# Patient Record
Sex: Female | Born: 1935
Health system: Southern US, Community
[De-identification: ages and names within clinical notes are randomized; demographics above are authoritative.]

## PROBLEM LIST (undated history)

## (undated) DIAGNOSIS — R002 Palpitations: Secondary | ICD-10-CM

## (undated) DIAGNOSIS — I779 Disorder of arteries and arterioles, unspecified: Secondary | ICD-10-CM

## (undated) DIAGNOSIS — I253 Aneurysm of heart: Secondary | ICD-10-CM

## (undated) DIAGNOSIS — C561 Malignant neoplasm of right ovary: Secondary | ICD-10-CM

## (undated) DIAGNOSIS — I340 Nonrheumatic mitral (valve) insufficiency: Secondary | ICD-10-CM

## (undated) DIAGNOSIS — E785 Hyperlipidemia, unspecified: Secondary | ICD-10-CM

## (undated) DIAGNOSIS — I10 Essential (primary) hypertension: Secondary | ICD-10-CM

## (undated) DIAGNOSIS — I639 Cerebral infarction, unspecified: Secondary | ICD-10-CM

## (undated) DIAGNOSIS — I719 Aortic aneurysm of unspecified site, without rupture: Secondary | ICD-10-CM

## (undated) DIAGNOSIS — C801 Malignant (primary) neoplasm, unspecified: Secondary | ICD-10-CM

## (undated) DIAGNOSIS — Z8679 Personal history of other diseases of the circulatory system: Secondary | ICD-10-CM

## (undated) DIAGNOSIS — I712 Thoracic aortic aneurysm, without rupture, unspecified: Secondary | ICD-10-CM

## (undated) HISTORY — DX: Disorder of arteries and arterioles, unspecified: I77.9

## (undated) HISTORY — DX: Malignant (primary) neoplasm, unspecified: C80.1

## (undated) HISTORY — PX: CERVICAL BIOPSY  W/ LOOP ELECTRODE EXCISION: SUR135

## (undated) HISTORY — DX: Aneurysm of heart: I25.3

## (undated) HISTORY — DX: Hyperlipidemia, unspecified: E78.5

## (undated) HISTORY — DX: Cerebral infarction, unspecified: I63.9

## (undated) HISTORY — DX: Palpitations: R00.2

## (undated) HISTORY — DX: Thoracic aortic aneurysm, without rupture, unspecified: I71.20

## (undated) HISTORY — DX: Aortic aneurysm of unspecified site, without rupture: I71.9

## (undated) HISTORY — PX: WRIST FRACTURE SURGERY: SHX121

## (undated) HISTORY — DX: Nonrheumatic mitral (valve) insufficiency: I34.0

---

## 1988-04-05 HISTORY — PX: ROTATOR CUFF REPAIR: SHX139

## 1991-04-06 HISTORY — PX: APPENDECTOMY: SHX54

## 1994-04-05 HISTORY — PX: CHOLECYSTECTOMY: SHX55

## 1997-07-19 ENCOUNTER — Ambulatory Visit (HOSPITAL_COMMUNITY): Admission: RE | Admit: 1997-07-19 | Discharge: 1997-07-19 | Payer: Self-pay | Admitting: Obstetrics and Gynecology

## 1997-07-23 ENCOUNTER — Ambulatory Visit (HOSPITAL_COMMUNITY): Admission: RE | Admit: 1997-07-23 | Discharge: 1997-07-23 | Payer: Self-pay | Admitting: Obstetrics and Gynecology

## 1998-01-17 ENCOUNTER — Ambulatory Visit (HOSPITAL_COMMUNITY): Admission: RE | Admit: 1998-01-17 | Discharge: 1998-01-17 | Payer: Self-pay | Admitting: Obstetrics and Gynecology

## 1998-08-14 ENCOUNTER — Encounter: Payer: Self-pay | Admitting: Obstetrics and Gynecology

## 1998-08-14 ENCOUNTER — Ambulatory Visit (HOSPITAL_COMMUNITY): Admission: RE | Admit: 1998-08-14 | Discharge: 1998-08-14 | Payer: Self-pay | Admitting: Obstetrics and Gynecology

## 1998-11-14 ENCOUNTER — Other Ambulatory Visit: Admission: RE | Admit: 1998-11-14 | Discharge: 1998-11-14 | Payer: Self-pay | Admitting: Obstetrics and Gynecology

## 1999-12-09 ENCOUNTER — Encounter: Payer: Self-pay | Admitting: Obstetrics and Gynecology

## 1999-12-09 ENCOUNTER — Ambulatory Visit (HOSPITAL_COMMUNITY): Admission: RE | Admit: 1999-12-09 | Discharge: 1999-12-09 | Payer: Self-pay | Admitting: Obstetrics and Gynecology

## 1999-12-31 ENCOUNTER — Other Ambulatory Visit: Admission: RE | Admit: 1999-12-31 | Discharge: 1999-12-31 | Payer: Self-pay | Admitting: Obstetrics and Gynecology

## 2001-02-14 ENCOUNTER — Encounter: Payer: Self-pay | Admitting: Internal Medicine

## 2001-02-14 ENCOUNTER — Encounter: Admission: RE | Admit: 2001-02-14 | Discharge: 2001-02-14 | Payer: Self-pay | Admitting: Internal Medicine

## 2001-03-16 ENCOUNTER — Other Ambulatory Visit: Admission: RE | Admit: 2001-03-16 | Discharge: 2001-03-16 | Payer: Self-pay | Admitting: Obstetrics and Gynecology

## 2001-04-10 ENCOUNTER — Encounter: Payer: Self-pay | Admitting: Obstetrics and Gynecology

## 2001-04-10 ENCOUNTER — Ambulatory Visit (HOSPITAL_COMMUNITY): Admission: RE | Admit: 2001-04-10 | Discharge: 2001-04-10 | Payer: Self-pay | Admitting: Obstetrics and Gynecology

## 2002-04-24 ENCOUNTER — Other Ambulatory Visit: Admission: RE | Admit: 2002-04-24 | Discharge: 2002-04-24 | Payer: Self-pay | Admitting: Obstetrics and Gynecology

## 2002-05-16 ENCOUNTER — Encounter: Payer: Self-pay | Admitting: Obstetrics and Gynecology

## 2002-05-16 ENCOUNTER — Ambulatory Visit (HOSPITAL_COMMUNITY): Admission: RE | Admit: 2002-05-16 | Discharge: 2002-05-16 | Payer: Self-pay | Admitting: Obstetrics and Gynecology

## 2003-05-23 ENCOUNTER — Encounter: Admission: RE | Admit: 2003-05-23 | Discharge: 2003-05-23 | Payer: Self-pay | Admitting: Internal Medicine

## 2003-06-06 ENCOUNTER — Encounter (INDEPENDENT_AMBULATORY_CARE_PROVIDER_SITE_OTHER): Payer: Self-pay | Admitting: *Deleted

## 2003-06-06 ENCOUNTER — Ambulatory Visit (HOSPITAL_COMMUNITY): Admission: RE | Admit: 2003-06-06 | Discharge: 2003-06-06 | Payer: Self-pay | Admitting: Gastroenterology

## 2004-03-04 ENCOUNTER — Ambulatory Visit (HOSPITAL_COMMUNITY): Admission: RE | Admit: 2004-03-04 | Discharge: 2004-03-04 | Payer: Self-pay | Admitting: Obstetrics and Gynecology

## 2004-03-12 ENCOUNTER — Other Ambulatory Visit: Admission: RE | Admit: 2004-03-12 | Discharge: 2004-03-12 | Payer: Self-pay | Admitting: *Deleted

## 2005-08-03 ENCOUNTER — Encounter: Admission: RE | Admit: 2005-08-03 | Discharge: 2005-08-03 | Payer: Self-pay | Admitting: Family Medicine

## 2006-02-02 ENCOUNTER — Other Ambulatory Visit: Admission: RE | Admit: 2006-02-02 | Discharge: 2006-02-02 | Payer: Self-pay | Admitting: Obstetrics & Gynecology

## 2006-02-03 ENCOUNTER — Ambulatory Visit (HOSPITAL_COMMUNITY): Admission: RE | Admit: 2006-02-03 | Discharge: 2006-02-03 | Payer: Self-pay | Admitting: Obstetrics & Gynecology

## 2007-03-21 ENCOUNTER — Inpatient Hospital Stay (HOSPITAL_COMMUNITY): Admission: EM | Admit: 2007-03-21 | Discharge: 2007-03-26 | Payer: Self-pay | Admitting: *Deleted

## 2007-03-23 ENCOUNTER — Ambulatory Visit: Payer: Self-pay | Admitting: Surgery

## 2007-03-23 ENCOUNTER — Encounter (INDEPENDENT_AMBULATORY_CARE_PROVIDER_SITE_OTHER): Payer: Self-pay | Admitting: *Deleted

## 2009-01-09 ENCOUNTER — Encounter: Payer: Self-pay | Admitting: Cardiology

## 2009-01-09 HISTORY — PX: US ECHOCARDIOGRAPHY: HXRAD669

## 2009-05-29 ENCOUNTER — Ambulatory Visit (HOSPITAL_COMMUNITY): Admission: RE | Admit: 2009-05-29 | Discharge: 2009-05-29 | Payer: Self-pay | Admitting: Family Medicine

## 2009-12-04 ENCOUNTER — Emergency Department (HOSPITAL_COMMUNITY): Admission: EM | Admit: 2009-12-04 | Discharge: 2009-12-04 | Payer: Self-pay | Admitting: Emergency Medicine

## 2010-01-06 ENCOUNTER — Ambulatory Visit: Payer: Self-pay | Admitting: Cardiology

## 2010-08-18 NOTE — Discharge Summary (Signed)
NAMESHAMYRA, Regina James               ACCOUNT NO.:  [id number]   MEDICAL RECORD NO.:  [id number]          PATIENT TYPE:  INP   LOCATION:  1414                         FACILITY:  [facility]   PHYSICIAN:  Beckey Rutter, MD  DATE OF BIRTH:  May 12, 1933   DATE OF ADMISSION:  03/21/2007  DATE OF DISCHARGE:  03/24/2007                               DISCHARGE SUMMARY   CHIEF COMPLAINT ON PRESENTATION:  Syncope.   HISTORY OF PRESENT ILLNESS:  Please refer to the patient's dictated H&P  by Dr. Buena Irish.   HOSPITAL PROCEDURE:  A 2-D echo done on March 23, 2007, and showing  overall left ventricular systolic function with normal left ventricular  ejection fraction with estimated range between 55-60%.  There was no  diagnostic evidence of left ventricular regional wall motion  abnormalities.   CAT scan done the day of admission March 21, 2007, impression:  Probable smallfocus of posttraumatic  subarachnoid hemorrhage in the  posterior left parietal lobe.  No other acute finding.  No evidence of  fracture or frank edema.  Repeat for the CT head done to assess the size  of the hematoma done yesterday on March 23, 2007, showing resolving  subarachnoid hemorrhage in the left parietal region.  No new bleeding  identified.  Mild chronic sinusitis.  The patient had chest x-ray done  on the day of admission showing a near anatomic alignment after distal  radius fracture reduction, and this was after the reduction, and the  first one showing Colace fracture of the distal radius.  The patient had  carotid duplex with no significant disease as well during this  hospitalization.   DISCHARGE DIAGNOSES:  1. Syncope with unclear etiology, could be vasovagal in nature with      negative monitoring during the hospital and negative syncope      workup.  2. Right wrist Colles fracture.  3. Status post open reduction and internal fixation with DVR plate and      diffuse bone grafting.    SECONDARY DIAGNOSIS:  Hypertension.   DISCHARGE MEDICATIONS:  1. Vicodin for pain consult.  2. Toprol XL 100 mg half tab every day, i.e., 50 mg daily.   HOSPITAL COURSE:  During hospitalization, the patient was evaluated for  her syncope with workup nonrevealing as discussed above.  The patient  does not have any syncope or any cardiac activity on the monitoring  during  hospitalization.  The patient underwent open reduction and internal  fixation of her right Colles fracture.  The patient is stable for  discharge today to follow up with her primary physician and to follow up  with Dr. Dominica Severin III.  The patient is aware and agreeable to this  discharge plan.      Beckey Rutter, MD  Electronically Signed     EME/MEDQ  D:  03/24/2007  T:  03/26/2007  Job:  [id number]

## 2010-08-18 NOTE — H&P (Signed)
NAMEAMARIONNA, Regina James NO.:  [id number]   MEDICAL RECORD NO.:  [id number]          PATIENT TYPE:  INP   LOCATION:  0103                         FACILITY:  [facility]   PHYSICIAN:  Thomasenia Bottoms, MDDATE OF BIRTH:  Dec 11, 1933   DATE OF ADMISSION:  03/21/2007  DATE OF DISCHARGE:                              HISTORY & PHYSICAL   PRIMARY CARE PHYSICIAN:  Dr. Loyal Jacobson, part of [facility]; office in on [address].   CARDIOLOGIST:  Dr. Cassell Clement.   CHIEF COMPLAINT:  Syncope.   HISTORY OF PRESENTING ILLNESS:  Regina James is a pleasant 77 year old  who was feeling fine, in her usual state of health today, when she got  up on a stepladder to reach something in the kitchen cupboard.  The next  thing she knows, she was coming through the doors in the emergency  department.  She has no recollection of falling.  Her husband provides  much of the interim history.  He says he was upstairs and when he came  downstairs, he found her sitting on a chair with blood all over the side  of her face, also on her wrist.  He also saw blood on the floor near the  refrigerator and the stepstool was still there, so he figured out that  she fell, but the patient said she did not remember what happened, she  was okay, she was talking to him.  She was able to walk to the car.  They came to the emergency department.  Here in the ER, she reports that  she has no recollection of getting up off the floor or walking to the  couch, of talking with her husband, of the ride to the emergency  department; she has no recollection of any of that.  She remembers prior  to the fall that she felt fine; she was not lightheaded, no chest pain,  no headache.  The patient had been up and down ladders because they just  decorated the Christmas tree in part this morning and she had no  difficulty going up and down the ladders in no side-effects or symptoms  earlier.  She has  been trouble with a cough for 1-1/2 weeks.  She has a  lot of congestion and cough, but no real shortness of breath and no  fever.  She is having some sinus trouble.  She also report some neck  stiffness for about a week, but again, no headache, no fever and  otherwise has really felt fine.   PAST MEDICAL HISTORY:  Significant for irregular heartbeat which was  diagnosed over 12 years ago.  She has not required any blood thinner, no  Coumadin, just takes Toprol for this and has not had any problems in 12  years essentially.   SURGICAL HISTORY:  Significant for:  1. Right rotator cuff surgery about 12 years ago.  2. Gallbladder surgery approximately 5 years ago; that was her most      recent surgery.  3. History of appendectomy.   SOCIAL HISTORY:  She does smoke cigarettes, about  10 cigarettes a day.  She drinks 1-2 glasses of red wine every day, never more than that.  No  illicit drug use.   FAMILY HISTORY:  Significant for mother who had rheumatic fever and what  sounds like valvular heart disease.   HEALTH MAINTENANCE:  She sees a cardiologist only once a year and again,  has had no problems in at least 10 years from her irregular heartbeat.   MEDICATIONS ON ARRIVAL:  1. Toprol-XL 100 mg a half a tablet every day.  2. She does not take aspirin on a regular basis or any blood thinner.  3. She takes some over-the-counter sleep aid occasionally for      insomnia.   REVIEW OF SYSTEMS:  CONSTITUTIONAL:  No night sweats.  Her appetite has  been slightly diminished over the last week during her chest congestion;  she may have lost a couple of pounds over the last week, but no  significant weight loss.  HEENT:  No headache, no sore throat.  She has  had both some postnasal drip and nasal congestion.  NECK:  She has had a  little neck stiffness.  No difficulty swallowing.  CARDIOVASCULAR:  No  chest pain or lower extremity edema.  No palpitations.  RESPIRATORY:  This cough which is  nonproductive.  No hemoptysis.  No shortness of  breath.  GI:  No diarrhea or constipation, no vomiting blood, no blood  in her stool.  GU:  No hematuria.  MUSCULOSKELETAL:  She has no trouble  with joint pains.  Her exercise tolerance is excellent.  She reports  that she can walk up 2 flights of stairs with no trouble whatsoever.  She takes a Education administrator class weekly.  She walks weekly.  She is in  excellent shape and has no exertional chest pain, no trouble with joint  pains, no trouble with lightheadedness.  NEUROLOGIC:  No asymmetric  weakness.  No paresthesias.  No history of seizures.  GYN:  The patient  is postmenopausal.  PSYCHIATRIC:  She has mild insomnia. All other  systems reviewed and are negative.   PHYSICAL EXAMINATION:  VITALS:  On arrival, blood pressure was 148/43,  temperature 98.3, pulse 61, respiratory rate 16, O2 SATs 96% on room  air.  GENERAL:  The patient is well-nourished, well-developed and in no acute  distress.  She looks much younger than her stated age.  HEENT:  She has a newly stitched laceration just above the right lateral  corner for her eyebrow with minimal surrounding swelling.  Her pupils  are equal, round and briskly reactive bilaterally.  Sclerae are  anicteric.  Oral mucosa moist.  NECK:  Supple.  No lymphadenopathy, no thyromegaly, no jugular venous  distention.  CARDIAC:  Regular rate and rhythm with no murmurs, gallops or rubs.  LUNGS:  Her lungs do have some rhonchi that clear completely with her  cough.  ABDOMEN:  Soft, nontender and nondistended.  Normoactive bowel sounds.  No masses are appreciated.  EXTREMITIES:  Her extremities reveal no evidence of clubbing, cyanosis  or edema.  NEUROLOGICAL:  She is alert, oriented and cooperative.  Her cranial  nerves II-XII are intact grossly.  She has 5/5 strength in her upper and  lower extremities.  Her sensory exam is intact grossly, though she does  have a soft cast on the distal right  upper extremity because of the  wrist fracture.  SKIN:  Otherwise intact.  No open lesions or rashes.  MUSCULOSKELETAL:  Examination reveals no evidence of effusion of her  joints.  Sacral area was not examined.   DATA:  Her white count is 15.5, hemoglobin 15.8, hematocrit 44.9,  platelet count is 361,000.  Sodium is 142, potassium 4.8, chloride 106,  bicarb 26, glucose 113, BUN 15, creatinine 0.8.  Her urinalysis is  essentially unremarkable.   The patient had a CT scan of her head.  The preliminary report reveals  possible tiny subarachnoid hemorrhage secondary to trauma.   Her EKG reveals sinus bradycardia with a rate of 57 and is otherwise a  normal EKG.   Chest x-ray report is not available to me at this time.   ASSESSMENT AND PLAN:  1. Head trauma, post fall with loss of consciousness.  The patient has      been cleared from a trauma perspective by the emergency department      physician.  The abnormal preliminary CT report was discussed with      neurosurgery and Dr. Edmund Hilda; After reviewing the CT, he      reports that there is no real abnormality of the CAT scan, no      evidence of hemorrhage, no reason to repeat her scan, as he feels      the scan is normal.  2. Syncope.  We will admit the patient for 24-hour observation,      telemetry, rule her out for myocardial infarction.  Certainly,      given her history of irregular heartbeat, a brief irregular heart      beat is a possibility.  The patient had absolutely no prodrome, no      lightheadedness, nothing like that prior to the fall and had been      up and down ladders daily, so we will monitor her on telemetry.      Certainly, seizure is a possibility as well, but we just have no      clear evidence for this; it was unwitnessed, so all we can do is      monitor the patient overnight.  Her EKG, laboratories, everything      appears normal at this time.  The patient is completely      asymptomatic and feels  fine, so we can do is monitor her.  3. Right wrist fracture.  She has been seen and evaluated by      Orthopedics and the patient will require surgical repair of that      fracture.  The plan at this time is for her to go to the operating      room tomorrow evening.  In terms of preoperative clearance, the      patient is essentially low risk for cardiovascular morbidity and      mortality.  She has superb exercise tolerance without any      exertional symptoms.  She is quite active and works out regularly.      Certainly, there is the chance of irregular heart beat in the      perioperative period, but we will monitor this disease.  The impact      of this syncopal episode is unknown, as we do not have a clear      cause and have no evidence of significant or dangerous medical      condition at this time.  4. Tobacco abuse.  I have counsel the patient to quit.  She will need      further counseling while here in  the hospital.      Thomasenia Bottoms, MD  Electronically Signed     CVC/MEDQ  D:  03/21/2007  T:  03/22/2007  Job:  [id number]   cc:   Loyal Jacobson MD   Cassell Clement, M.D.  Fax: [phone]

## 2010-08-18 NOTE — Op Note (Signed)
NAMEJANNICE, Regina James               ACCOUNT NO.:  [id number]   MEDICAL RECORD NO.:  [id number]          PATIENT TYPE:  INP   LOCATION:  1414                         FACILITY:  [facility]   PHYSICIAN:  Regina Ano. Gramig III, M.D.DATE OF BIRTH:  [birth year]   DATE OF PROCEDURE:  03/22/2007  DATE OF DISCHARGE:                               OPERATIVE REPORT   PREOPERATIVE DIAGNOSIS:  Comminuted complex interarticular right distal  radius fracture with ongoing swelling and pain complaints.   POSTOPERATIVE DIAGNOSIS:  Comminuted complex interarticular right distal  radius fracture with ongoing swelling and pain complaints.   PROCEDURE:  1. Open reduction internal fixation with DVR plate and Actifuse bone      grafting. greater than five part intra-articular fracture.  2. Stress radiography.  3. Fasciotomy volar forearm.  4. Incision and drainage 1 cm laceration volar ulnar wrist.   SURGEON:  Regina Ano. Amanda Pea, M.D.   ASSISTANT:  Karie Chimera, PA   COMPLICATIONS:  None.   ANESTHESIA:  General.   DRAINS:  One.   ESTIMATED BLOOD LOSS:  Minimal.   COMPLICATIONS:  None.   TOURNIQUET TIME:  Less than hour.   INDICATIONS FOR PROCEDURE:  This patient is a pleasant 77 year old  female who underwent provisional reduction March 21, 2007.  She was  seen today.  Given her metathesis comminution and James abnormalities  including age, smoking habits, degree of osteoporosis, etc. we  recommended fixation with bone grafting to prevent problems with  nonunion, malunion and James issues given her fracture pattern and  greater than five part fracture.  Given her significant swelling, we  also wanted to release her compartments.  I discussed with her the risks  and benefits of surgery at length including risk of infection, bleeding,  anesthesia, damage to normal structures and failure of surgery to  accomplish its intended goals of relieving symptoms and restoring  function.  With this in mind  she desires to proceed.  All questions have  been encouraged and answered preoperatively.   OPERATIVE PROCEDURE:  The patient seen by myself and anesthesia, given  preoperative Ancef, counseled extensively, permit was signed and she was  then taken to the operative suite, and underwent general anesthetic.  Following this she was prepped and draped in usual sterile fashion about  right upper extremity.  Volar incision was made and dissection was  carried down and extension was made of the incision to perform  fasciotomy of the deep and superficial volar compartment.  The muscles  egressed nicely.  There was no devitalized or ischemic musculature.  I  was pleased with this and the findings.  Following this, I then  performed sweeping of the FCR radially.  I identified the flexor  contents of the carpal canals, swept them with retractor gently ulnarly  and then incised the pronator quadratus.  Dissection was carried down  and a large amount of Actifuse bone graft was placed in the metaphyseal  defect.  I then reassembled the fracture and provisional fixation was  held. Following this a DVR plate from [employer] was applied in  standard technique.  The patient tolerated this well.  Following  applying the plate, I then took stress radiography views, AP lateral and  oblique demonstrating excellent position and excellent stability.  Live  fluoro demonstrated full stability and no complicating features.  The  plate looked excellent.  Final copies were made for permanent  documentation.  Wound was irrigated, pronator was closed with Vicryl to  reapproximate it over the plate and the TLS drain was placed followed by  closure of the subcu with Vicryl and skin edge with Prolene.  Following  this, the volar ulnar abrasion was I&D'd, skin, subcutaneous tissue was  I&D'd about a 1-cm area of injury.  I explored the area at length to  make sure there was no open fracture about the ulnar or James   abnormality.  There was not.  I was pleased with this and the findings.  I then irrigated copiously and placed her in a soft dressing and volar  plaster splint.  The patient tolerated this well.  There no complicating  features.  Following this she was awakened from anesthesia, monitored in  recovery room and will be discharged home.  She was in stable condition  at time of discharge and there was no immediate intraoperative  complications.  It has been pleasure to participate in her care and I  look forward to participating in her postop recovery.           ______________________________  Regina James, M.D.     Regina James  D:  03/22/2007  T:  03/23/2007  Job:  [id number]

## 2010-08-18 NOTE — Consult Note (Signed)
Regina James, Regina James               ACCOUNT NO.:  [id number]   MEDICAL RECORD NO.:  [id number]          PATIENT TYPE:  INP   LOCATION:  1414                         FACILITY:  [facility]   PHYSICIAN:  Dionne Ano. Gramig III, M.D.DATE OF BIRTH:  Feb 20, 1934   DATE OF CONSULTATION:  DATE OF DISCHARGE:                                 CONSULTATION   It is a such a pleasure to see Regina James today for evaluation of her  upper extremity predicament.  I was asked to see her by Dr. Cathren Laine  in regards to her significantly fracture of right wrist with deformity.  This patient is 77 years of age, and she complains of pain in her hand  and wrist after a fall.  She had a syncopal-type event.  She does not  remember all the details and presented with a laceration to the right  facial region about her orbit.  She denies vision changes, numbness,  tingling in her arms, nausea or history of fainting spells.  The patient  cannot recall fracture in the wrist in the past.   I reviewed all issues which she, her daughter and her husband.   ALLERGIES:  I SHOULD NOTE SHE IS ALLERGIC TO MORPHINE WHICH CAUSES A  RASH.   PAST MEDICAL HISTORY:  She has a past medical history of an irregular  heartbeat, which Dr. Patty Sermons sees.  She takes Toprol for this.   SURGICAL HISTORY:  She does have her surgical history reviewed.   SOCIAL HISTORY:  She is retired under social history and enjoys red  wine, in terms of alcohol use and is a smoker.   EXAMINATION:  GENERAL:  On examination she is a white female, alert and  oriented in no acute distress.  VITAL SIGNS:  Stable.  She has full sensation to the fingers at present  time.  EXTREMITIES:  She has a large amount of swelling and significant  deformity about the right wrist.  Pulses are normal.  Elbow is  nontender.  Shoulder is nontender.  Left upper extremity is  neurovascularly intact, normal alignment, stability and  range of  motion.  Lower extremity  examination is benign.  There is no evidence of  instability about the knee, ankle, or hips.  I reviewed this at length.   Her x-rays are reviewed which show a significantly comminuted, greater  than five-part a distal radius fracture.   IMPRESSION:  Comminuted complex interarticular distal radius fracture  with tab with metaphyseal comminution in a 77 year old female, status  post fall.   PLAN:  I have given her hematoma block.  This provided excellent  anesthesia for closed reduction.  She was placed in finger trap traction  and underwent a manipulative reduction.  Following this, I took x-rays  which showed ample improvement in her status.  Following this, I  discussed with her elevation, finger range of motion, massage, edema  control etc.  I have given her comminution, severity of injury at  presentation and a metaphyseal bone loss.  We would recommend definitive  stabilization.  She is going to be admitted  for observation secondary to  her syncope, and we will plan to proceed with likely ORIF during the  hospitalization.  I have discussed the risks and benefits of surgery,  including risk of infection, bleeding, anesthesia, damage to normal  structures and failure of surgery to accomplish his intended goals of  relieving symptoms and restoring function.  I will plan for an EPL  decompression, possible carpal tunnel release as necessary, ORIF with  Allograft bone graft versus synthetic bone graft to fill the metaphyseal  void.  I have discussed with him the risks and benefits, time frame,  duration of recovery etc.  Will proceed accordingly at a mutually  convenient  time once she stable and she will be admitted under the care  of [facility], Dr. Gasper James, for the syncope issues.  I have discussed  this with Dr. Gasper James and Dr. Denton James.  Her laceration about the  orbital region is going to be I&D'd and sutured by emergency room staff.  It was a pleasure to see her day.  All  questions have been encouraged  and answered.           ______________________________  Dionne Ano. Everlene Other, M.D.     [facility]  D:  03/21/2007  T:  03/22/2007  Job:  [id number]   cc:   Trudi Ida. Regina James, M.D.  Fax: [phone]   Thomasenia Bottoms, MD

## 2010-08-21 NOTE — Op Note (Signed)
NAMEDORANNE, SCHMUTZ                         ACCOUNT NO.:  [id number]   MEDICAL RECORD NO.:  [id number]                   PATIENT TYPE:  AMB   LOCATION:  ENDO                                 [facility]ACILITY:  [facility]   PHYSICIAN:  Danise Edge, M.D.                DATE OF BIRTH:  1933/10/30   DATE OF PROCEDURE:  06/06/2003  DATE OF DISCHARGE:                                 OPERATIVE REPORT   REFERRING PHYSICIAN:  Dr. Wilson Singer.   PROCEDURE INDICATIONS:  Regina James is a 77 year old female born on  [birth year].  Regina James is scheduled to undergo her first screening  colonoscopy with polypectomy to prevent colon cancer.   ENDOSCOPIST:  Danise Edge, M.D.   PREMEDICATION:  Versed 5 mg, Demerol 50 mg.   PROCEDURE:  After obtaining informed consent, Regina James was placed in the  left lateral decubitus position.  I administered intravenous Demerol and  intravenous Versed to achieve conscious sedation for the procedure.  Patient's blood pressure, oxygen saturation, and cardiac rhythm were  monitored throughout the procedure and documented in the medical record.   Anal inspection was normal.  Digital rectal exam was normal.  The Olympus  adjustable pediatric colonoscope was then introduced into the rectum and  advanced to the cecum.  Colonic preparation for the exam today was  excellent.  Rectum was normal.  Sigmoid colon and descending colon:  Four 1-  3 mm sized polyps were removed from the distal sigmoid colon at  approximately 25-30 cm from the anal verge and submitted for pathologic  interpretation.  Splenic flexure normal.  Transverse colon normal.  Hepatic  flexure normal.  Ascending colon normal.  Cecum and ileocecal valve normal.   ASSESSMENT:  Four small polyps were removed from the distal sigmoid colon  with the electrocautery snare and submitted for pathological interpretation.  Otherwise normal proctocolonoscopy of the cecum.   RECOMMENDATIONS:  Repeat  colonoscopy in five years if polyps return  neoplastic pathologically.                                               Danise Edge, M.D.    MJ/MEDQ  D:  06/06/2003  T:  06/06/2003  Job:  [id number]   cc:   Wilson Singer, M.D.  [address]  [place]  [place] [address]  Fax: [phone]   Laqueta Linden, M.D.  [address]  [place]  [place] [address]  Fax: [phone]

## 2010-09-17 ENCOUNTER — Observation Stay (HOSPITAL_COMMUNITY)
Admission: EM | Admit: 2010-09-17 | Discharge: 2010-09-17 | Disposition: A | Discharge status: Home / Self Care | Payer: Medicare Other | Attending: Internal Medicine | Admitting: Internal Medicine

## 2010-09-17 DIAGNOSIS — R221 Localized swelling, mass and lump, neck: Secondary | ICD-10-CM | POA: Insufficient documentation

## 2010-09-17 DIAGNOSIS — F17200 Nicotine dependence, unspecified, uncomplicated: Secondary | ICD-10-CM | POA: Insufficient documentation

## 2010-09-17 DIAGNOSIS — I499 Cardiac arrhythmia, unspecified: Secondary | ICD-10-CM | POA: Insufficient documentation

## 2010-09-17 DIAGNOSIS — R220 Localized swelling, mass and lump, head: Principal | ICD-10-CM | POA: Insufficient documentation

## 2010-09-17 DIAGNOSIS — I10 Essential (primary) hypertension: Secondary | ICD-10-CM | POA: Insufficient documentation

## 2010-09-17 LAB — POCT I-STAT, CHEM 8
BUN: 13 mg/dL (ref 6–23)
Calcium, Ion: 1.17 mmol/L (ref 1.12–1.32)
Chloride: 105 mEq/L (ref 96–112)
Creatinine, Ser: 0.9 mg/dL (ref 0.4–1.2)
Glucose, Bld: 93 mg/dL (ref 70–99)
HCT: 52 % — ABNORMAL HIGH (ref 36.0–46.0)
Hemoglobin: 17.7 g/dL — ABNORMAL HIGH (ref 12.0–15.0)
Potassium: 3.9 mEq/L (ref 3.5–5.1)
Sodium: 142 mEq/L (ref 135–145)
TCO2: 28 mmol/L (ref 0–100)

## 2010-09-17 LAB — CBC
HCT: 47.9 % — ABNORMAL HIGH (ref 36.0–46.0)
Hemoglobin: 16.2 g/dL — ABNORMAL HIGH (ref 12.0–15.0)
MCH: 32.7 pg (ref 26.0–34.0)
MCHC: 33.8 g/dL (ref 30.0–36.0)
MCV: 96.8 fL (ref 78.0–100.0)
Platelets: 322 10*3/uL (ref 150–400)
RBC: 4.95 MIL/uL (ref 3.87–5.11)
RDW: 14.7 % (ref 11.5–15.5)
WBC: 9.5 10*3/uL (ref 4.0–10.5)

## 2010-10-04 NOTE — H&P (Signed)
Regina James, VANDEMARK NO.:  [id number]  MEDICAL RECORD NO.:  [id number]  LOCATION:  WLED                         FACILITY:  [facility]  PHYSICIAN:  Lonia Blood, M.D.      DATE OF BIRTH:  12-21-1933  DATE OF ADMISSION:  09/17/2010 DATE OF DISCHARGE:                             HISTORY & PHYSICAL   PRIMARY CARE PHYSICIAN:  Talmadge Coventry, M.D.  PRESENTING COMPLAINT:  Swollen tongue.  HISTORY OF PRESENT ILLNESS:  The patient is a 77 year old female with a history of her hypertension and arrhythmias who apparently had woke up in the middle of the night with sudden onset of swollen tongue.  She went to bed fine without any problems and did not remember taking anything new.  She had a combination of peaches and some peanut butter jelly, which is a whole food peanut butter.  That is all she had tonight and did not eat out.  Per patient, she has had that before without any problem.  She felt initially that she may have had canker sores, but then her tongue continued to swell and she came to the emergency room. In the ED, she received epinephrine.  Her tongue started going down, but is still swollen.  The patient is, therefore, being admitted for observation since she is having some mild swallowing problem.  PAST MEDICAL HISTORY:  Significant for some irregular heart rate and tobacco use.  ALLERGIES:  MORPHINE that gives her rash.  CURRENT MEDICATIONS:  Only Toprol-XL.  SOCIAL HISTORY:  She smokes about 1/2 to 1 pack per day.  Drinks a glass of wine every day with dinner.  No IV drug use.  FAMILY HISTORY:  Mainly hypertension.  REVIEW OF SYSTEMS:  All systems reviewed are negative except per HPI.  PHYSICAL EXAMINATION:  VITAL SIGNS:  Temperature 97.8, blood pressure 166/89, pulse 69, respiratory rate 20, sats 96% on room air. GENERAL:  She is awake, alert, and oriented.  She is in no acute distress. HEENT:  PERRL.  EOMI.  No pallor.  No jaundice.  No  rhinorrhea. NECK:  Supple.  No visible JVD.  No lymphadenopathy. RESPIRATORY:  She has good air entry bilaterally. HEENT:  Her tongue is swollen, especially on the left side, but no swollen palate.  Her lips are not swollen and no evidence of respiratory compromise.  She is speaking through her mouth.  No nasal blockage. RESPIRATORY:  Good air entry bilaterally.  No wheezes.  No rales.  No crackles. CARDIOVASCULAR SYSTEM:  S1 and S2.  No murmur. ABDOMEN:  Soft, full, nontender with positive bowel sounds. EXTREMITIES:  No edema, cyanosis, or clubbing. SKIN:  No rashes.  No ulcers.  LABORATORY DATA:  Sodium 142, potassium 3.9, chloride 105, BUN 13, creatinine 0.9, glucose 93, INR calcium 1.17.  White count 9.5, hemoglobin 16.2 with platelet count of 322.  ASSESSMENT:  This is a 77 year old female presenting with what appears to be an acute angioedema.  The trigger is clearly unknown.  She is not on any ACE inhibitors.  It may be food related from the combination of the peaches and the whole food peanut butter she took.  It could also be other  food component.  Per patient, she ate some fish, red snapper, so it could be also seafood related. At this point, the patient seems stable.  PLAN: 1. Angioedema.  We will admit the patient for observation overnight.     Continue with IV Pepcid, Benadryl, and Solu-Medrol.  The idea is to     see and make sure that her condition did not get worse.  If she     continues to get better and there is no worsening of her symptoms     by morning, we can probably discharge her home on oral medications. 2. Arrhythmias.  She is in sinus rhythm at the moment.  We will     continue with the beta blockers once we know the exact dose. 3. Hypertension.  Again, we will continue with her beta blockers. 4. Tobacco use.  I will give her some nicotine patch and tobacco     cessation counseling.  Further treatment will depend on the     patient's response to these  measures.     Lonia Blood, M.D.     Verlin Grills  D:  09/17/2010  T:  09/17/2010  Job:  [id number]  Electronically Signed by Lonia Blood M.D. on 10/04/2010 12:31:52 AM

## 2010-10-08 NOTE — Discharge Summary (Signed)
  NAMECARO, BRUNDIDGE NO.:  [id number]  MEDICAL RECORD NO.:  [id number]  LOCATION:  1441                         FACILITY:  [facility]  PHYSICIAN:  Clydia Llano, MD       DATE OF BIRTH:  1934/04/04  DATE OF ADMISSION:  09/17/2010 DATE OF DISCHARGE:  09/17/2010                              DISCHARGE SUMMARY   CARDIOLOGIST:  Cassell Clement, M.D.  PRIMARY CARE PHYSICIAN:  Unassigned  REASON FOR ADMISSION:  Swollen tongue.  DISCHARGE DIAGNOSES: 1. Swollen tongue likely angioedema. 2. History of arrhythmia. 3. Hypertension. 4. Tobacco abuse.  DISCHARGE MEDICATIONS: 1. Metoprolol succinate 100 mg take half tablet p.o. daily. 2. Multivitamin 1 tablet p.o. daily. 3. Sleep aid diphenhydramine 50 mg p.o. daily at bedtime as needed for     sleep. 4. Systane 1 drop in both eyes daily.  BRIEF HISTORY EXAMINATION:  Mrs. Hungate is a 77 year old Caucasian female with history of hypertension and arrhythmia, who was apparently had woke up in the middle of the night with sudden onset of swollen tongue.  She went to bed fine without any problems.  She did not remember taking anything new.  She had a combination of peaches, some peanut butter before she went to bed.  That is all she had.  The night of admission, patient did not eat out.  Patient did have some fish also earlier during the day.  The patient was evaluated in the Emergency Department and found to have swollen tongue consistent with angioedema, admitted for further evaluation.  BRIEF HOSPITAL STAY: 1. Swollen tongue likely angioedema:  patient admitted to the     hospital, Was given epinephrine, Solu-Medrol and Benadryl because     of assumed allergy.  The morning I saw her, patient was doing fine.     There is no evidence of swelling.  There is no respiratory     compromise, no shortness of breath or difficulty with swallowing.     The patient ate two meals without any difficulty.  The patient will     be  discharged home to follow up with primary care physician. 2. Arrhythmias:  Patient has sinus rhythm at the time of admission.     Patient taking metoprolol succinate.  Patient following with Dr.     Cassell Clement for her cardiology needs. 3. Hypertension:  Patient on beta-blockers, again, patient's blood     pressure is controlled.  No changes were done to her medications.  DISCHARGE INSTRUCTIONS: 1. Disposition:  Home. 2. Diet:  Heart-healthy diet. 3. Activity:  As tolerated.     Clydia Llano, MD     ME/MEDQ  D:  09/17/2010  T:  09/17/2010  Job:  [id number]  cc:   Cassell Clement, M.D. Fax: [phone]  Electronically Signed by Clydia Llano  on 10/08/2010 01:56:42 PM

## 2011-01-08 LAB — TROPONIN I: Troponin I: 0.03

## 2011-01-08 LAB — URINALYSIS, ROUTINE W REFLEX MICROSCOPIC
Bilirubin Urine: NEGATIVE
Glucose, UA: NEGATIVE
Hgb urine dipstick: NEGATIVE
Ketones, ur: NEGATIVE
Nitrite: NEGATIVE
Protein, ur: NEGATIVE
Specific Gravity, Urine: 1.022
Urobilinogen, UA: 1
pH: 6.5

## 2011-01-08 LAB — DIFFERENTIAL
Basophils Absolute: 0
Basophils Relative: 0
Eosinophils Absolute: 0.2
Eosinophils Relative: 1
Lymphocytes Relative: 17
Lymphs Abs: 2.7
Monocytes Absolute: 0.6
Monocytes Relative: 4
Neutro Abs: 12 — ABNORMAL HIGH
Neutrophils Relative %: 77

## 2011-01-08 LAB — CK TOTAL AND CKMB (NOT AT ARMC)
CK, MB: 9.8 — ABNORMAL HIGH
Relative Index: 4 — ABNORMAL HIGH
Total CK: 248 — ABNORMAL HIGH

## 2011-01-08 LAB — BASIC METABOLIC PANEL
BUN: 15
CO2: 26
Calcium: 9.5
Chloride: 106
Creatinine, Ser: 0.8
GFR calc Af Amer: >60
GFR calc non Af Amer: >60
Glucose, Bld: 113 — ABNORMAL HIGH
Potassium: 4.8
Sodium: 142

## 2011-01-08 LAB — CARDIAC PANEL(CRET KIN+CKTOT+MB+TROPI)
CK, MB: 8.7 — ABNORMAL HIGH
CK, MB: 8.9 — ABNORMAL HIGH
Relative Index: 3.3 — ABNORMAL HIGH
Relative Index: 3.6 — ABNORMAL HIGH
Total CK: 250 — ABNORMAL HIGH
Total CK: 263 — ABNORMAL HIGH
Troponin I: 0.03
Troponin I: 0.03

## 2011-01-08 LAB — CBC
HCT: 44.9
Hemoglobin: 15.8 — ABNORMAL HIGH
MCHC: 35.1
MCV: 97
Platelets: 361
RBC: 4.63
RDW: 14.2
WBC: 15.5 — ABNORMAL HIGH

## 2011-01-08 LAB — TSH: TSH: 3.663

## 2011-02-16 ENCOUNTER — Encounter: Payer: Self-pay | Admitting: Cardiology

## 2011-02-17 ENCOUNTER — Encounter: Payer: Self-pay | Admitting: Cardiology

## 2011-02-17 ENCOUNTER — Ambulatory Visit (INDEPENDENT_AMBULATORY_CARE_PROVIDER_SITE_OTHER): Payer: Medicare Other | Admitting: Cardiology

## 2011-02-17 VITALS — BP 138/82 | HR 74 | Ht 66.0 in | Wt 156.0 lb

## 2011-02-17 DIAGNOSIS — Z720 Tobacco use: Secondary | ICD-10-CM | POA: Insufficient documentation

## 2011-02-17 DIAGNOSIS — R002 Palpitations: Secondary | ICD-10-CM

## 2011-02-17 DIAGNOSIS — I739 Peripheral vascular disease, unspecified: Secondary | ICD-10-CM

## 2011-02-17 DIAGNOSIS — F17200 Nicotine dependence, unspecified, uncomplicated: Secondary | ICD-10-CM

## 2011-02-17 NOTE — Assessment & Plan Note (Signed)
The patient is not diabetic.  Almost certainly her claudication is related to her tobacco usage and we spoke about that today

## 2011-02-17 NOTE — Assessment & Plan Note (Signed)
The patient has not been expressing any recent palpitations.  She continues on low-dose daily metoprolol.  She no longer plays tennis but does play golf on a regular basis.  She is getting ready to go on a vacation to the [place] right after Thanksgiving

## 2011-02-17 NOTE — Assessment & Plan Note (Signed)
I advised the patient on the importance of quitting smoking particular since she has diminished pulses in her feet and is experiencing some mild claudication

## 2011-02-17 NOTE — Patient Instructions (Signed)
Your physician recommends that you continue on your current medications as directed. Please refer to the Current Medication list given to you today. Your physician wants you to follow-up in: 1 year You will receive a reminder letter in the mail two months in advance. If you don't receive a letter, please call our office to schedule the follow-up appointment.  Smoking Cessation This document explains the best ways for you to quit smoking and new treatments to help. It lists new medicines that can double or triple your chances of quitting and quitting for good. It also considers ways to avoid relapses and concerns you may have about quitting, including weight gain. NICOTINE: A POWERFUL ADDICTION If you have tried to quit smoking, you know how hard it can be. It is hard because nicotine is a very addictive drug. For some people, it can be as addictive as heroin or cocaine. Usually, people make 2 or 3 tries, or more, before finally being able to quit. Each time you try to quit, you can learn about what helps and what hurts. Quitting takes hard work and a lot of effort, but you can quit smoking. QUITTING SMOKING IS ONE OF THE MOST IMPORTANT THINGS YOU WILL EVER DO.  You will live longer, feel better, and live better.   The impact on your body of quitting smoking is felt almost immediately:   Within 20 minutes, blood pressure decreases. Pulse returns to its normal level.   After 8 hours, carbon monoxide levels in the blood return to normal. Oxygen level increases.   After 24 hours, chance of heart attack starts to decrease. Breath, hair, and body stop smelling like smoke.   After 48 hours, damaged nerve endings begin to recover. Sense of taste and smell improve.   After 72 hours, the body is virtually free of nicotine. Bronchial tubes relax and breathing becomes easier.   After 2 to 12 weeks, lungs can hold more air. Exercise becomes easier and circulation improves.   Quitting will reduce your  risk of having a heart attack, stroke, cancer, or lung disease:   After 1 year, the risk of coronary heart disease is cut in half.   After 5 years, the risk of stroke falls to the same as a nonsmoker.   After 10 years, the risk of lung cancer is cut in half and the risk of other cancers decreases significantly.   After 15 years, the risk of coronary heart disease drops, usually to the level of a nonsmoker.   If you are pregnant, quitting smoking will improve your chances of having a healthy baby.   The people you live with, especially your children, will be healthier.   You will have extra money to spend on things other than cigarettes.  FIVE KEYS TO QUITTING Studies have shown that these 5 steps will help you quit smoking and quit for good. You have the best chances of quitting if you use them together: 1. Get ready.  2. Get support and encouragement.  3. Learn new skills and behaviors.  4. Get medicine to reduce your nicotine addiction and use it correctly.  5. Be prepared for relapse or difficult situations. Be determined to continue trying to quit, even if you do not succeed at first.  1. GET READY  Set a quit date.   Change your environment.   Get rid of ALL cigarettes, ashtrays, matches, and lighters in your home, car, and place of work.   Do not let people smoke in  your home.   Review your past attempts to quit. Think about what worked and what did not.   Once you quit, do not smoke. NOT EVEN A PUFF!  2. GET SUPPORT AND ENCOURAGEMENT Studies have shown that you have a better chance of being successful if you have help. You can get support in many ways.  Tell your family, friends, and coworkers that you are going to quit and need their support. Ask them not to smoke around you.   Talk to your caregivers (doctor, dentist, nurse, pharmacist, psychologist, and/or smoking counselor).   Get individual, group, or telephone counseling and support. The more counseling you  have, the better your chances are of quitting. Programs are available at [employer] and health centers. Call your local health department for information about programs in your area.   Spiritual beliefs and practices may help some smokers quit.   Quit meters are Photographer that keep track of quit statistics, such as amount of "quit-time," cigarettes not smoked, and money saved.   Many smokers find one or more of the many self-help books available useful in helping them quit and stay off tobacco.  3. LEARN NEW SKILLS AND BEHAVIORS  Try to distract yourself from urges to smoke. Talk to someone, go for a walk, or occupy your time with a task.   When you first try to quit, change your routine. Take a different route to work. Drink tea instead of coffee. Eat breakfast in a different place.   Do something to reduce your stress. Take a hot bath, exercise, or read a book.   Plan something enjoyable to do every day. Reward yourself for not smoking.   Explore interactive web-based programs that specialize in helping you quit.  4. GET MEDICINE AND USE IT CORRECTLY Medicines can help you stop smoking and decrease the urge to smoke. Combining medicine with the above behavioral methods and support can quadruple your chances of successfully quitting smoking. The U.S. Food and Drug Administration (FDA) has approved 7 medicines to help you quit smoking. These medicines fall into 3 categories.  Nicotine replacement therapy (delivers nicotine to your body without the negative effects and risks of smoking):   Nicotine gum: Available over-the-counter.   Nicotine lozenges: Available over-the-counter.   Nicotine inhaler: Available by prescription.   Nicotine nasal spray: Available by prescription.   Nicotine skin patches (transdermal): Available by prescription and over-the-counter.   Antidepressant medicine (helps people abstain from smoking, but how this works is  unknown):   Bupropion sustained-release (SR) tablets: Available by prescription.   Nicotinic receptor partial agonist (simulates the effect of nicotine in your brain):   Varenicline tartrate tablets: Available by prescription.   Ask your caregiver for advice about which medicines to use and how to use them. Carefully read the information on the package.   Everyone who is trying to quit may benefit from using a medicine. If you are pregnant or trying to become pregnant, nursing an infant, you are under age 87, or you smoke fewer than 10 cigarettes per day, talk to your caregiver before taking any nicotine replacement medicines.   You should stop using a nicotine replacement product and call your caregiver if you experience nausea, dizziness, weakness, vomiting, fast or irregular heartbeat, mouth problems with the lozenge or gum, or redness or swelling of the skin around the patch that does not go away.   Do not use any other product containing nicotine while using a nicotine  replacement product.   Talk to your caregiver before using these products if you have diabetes, heart disease, asthma, stomach ulcers, you had a recent heart attack, you have high blood pressure that is not controlled with medicine, a history of irregular heartbeat, or you have been prescribed medicine to help you quit smoking.  5. BE PREPARED FOR RELAPSE OR DIFFICULT SITUATIONS  Most relapses occur within the first 3 months after quitting. Do not be discouraged if you start smoking again. Remember, most people try several times before they finally quit.   You may have symptoms of withdrawal because your body is used to nicotine. You may crave cigarettes, be irritable, feel very hungry, cough often, get headaches, or have difficulty concentrating.   The withdrawal symptoms are only temporary. They are strongest when you first quit, but they will go away within 10 to 14 days.  Here are some difficult situations to watch  for:  Alcohol. Avoid drinking alcohol. Drinking lowers your chances of successfully quitting.   Caffeine. Try to reduce the amount of caffeine you consume. It also lowers your chances of successfully quitting.   Other smokers. Being around smoking can make you want to smoke. Avoid smokers.   Weight gain. Many smokers will gain weight when they quit, usually less than 10 pounds. Eat a healthy diet and stay active. Do not let weight gain distract you from your main goal, quitting smoking. Some medicines that help you quit smoking may also help delay weight gain. You can always lose the weight gained after you quit.   Bad mood or depression. There are a lot of ways to improve your mood other than smoking.  If you are having problems with any of these situations, talk to your caregiver. SPECIAL SITUATIONS AND CONDITIONS Studies suggest that everyone can quit smoking. Your situation or condition can give you a special reason to quit.  Pregnant women/new mothers: By quitting, you protect your baby's health and your own.   Hospitalized patients: By quitting, you reduce health problems and help healing.   Heart attack patients: By quitting, you reduce your risk of a second heart attack.   Lung, head, and neck cancer patients: By quitting, you reduce your chance of a second cancer.   Parents of children and adolescents: By quitting, you protect your children from illnesses caused by secondhand smoke.  QUESTIONS TO THINK ABOUT Think about the following questions before you try to stop smoking. You may want to talk about your answers with your caregiver.  Why do you want to quit?   If you tried to quit in the past, what helped and what did not?   What will be the most difficult situations for you after you quit? How will you plan to handle them?   Who can help you through the tough times? Your family? Friends? Caregiver?   What pleasures do you get from smoking? What ways can you still get  pleasure if you quit?  Here are some questions to ask your caregiver:  How can you help me to be successful at quitting?   What medicine do you think would be best for me and how should I take it?   What should I do if I need more help?   What is smoking withdrawal like? How can I get information on withdrawal?  Quitting takes hard work and a lot of effort, but you can quit smoking. FOR MORE INFORMATION  Smokefree.gov (http://www.davis-sullivan.com/) provides free, accurate, evidence-based information and professional  assistance to help support the immediate and long-term needs of people trying to quit smoking. Document Released: 03/16/2001 Document Revised: 12/02/2010 Document Reviewed: 01/06/2009 [facility] Patient Information 2012 [place], [place].

## 2011-02-17 NOTE — Progress Notes (Signed)
Regina James Date of Birth:  01/14/1934 [facility] [address]   [address] [place], [place]  [address] [phone]           Fax   [phone]  History of Present Illness: This pleasant 77 year old woman is seen for a one-year followup office visit.  Has a past history of palpitations.  She does not have any history of ischemic heart disease.  He had an echocardiogram October 2010 which showed normal left ventricular systolic function with an ejection fraction of 55-60% she did have an impaired relaxation and mild aortic sclerosis with normal pulmonary artery pressure.  This was saw her a year ago the patient has started to get some mild bilateral claudication of the calves.  The patient does smoke cigarettes against advice.  Current Outpatient Prescriptions  Medication Sig Dispense Refill  . Doxylamine Succinate, Sleep, (SLEEP AID PO) Take by mouth. At hs       . metoprolol (TOPROL-XL) 50 MG 24 hr tablet Take 50 mg by mouth daily.        . Multiple Vitamin (MULTIVITAMIN) tablet Take 1 tablet by mouth daily.          No Known Allergies  There is no problem list on file for this patient.   History  Smoking status  . Current Everyday Smoker  Smokeless tobacco  . Not on file    History  Alcohol Use No    No family history on file.  Review of Systems: Constitutional: no fever chills diaphoresis or fatigue or change in weight.  Head and neck: no hearing loss, no epistaxis, no photophobia or visual disturbance. Respiratory: No cough, shortness of breath or wheezing. Cardiovascular: No chest pain peripheral edema, palpitations. Gastrointestinal: No abdominal distention, no abdominal pain, no change in bowel habits hematochezia or melena. Genitourinary: No dysuria, no frequency, no urgency, no nocturia. Musculoskeletal:No arthralgias, no back pain, no gait disturbance or myalgias. Neurological: No dizziness, no headaches, no numbness, no  seizures, no syncope, no weakness, no tremors. Hematologic: No lymphadenopathy, no easy bruising. Psychiatric: No confusion, no hallucinations, no sleep disturbance.    Physical Exam: Filed Vitals:   02/17/11 1525  BP: 138/82  Pulse: 74  The patient appears to be in no distress.  Head and neck exam reveals that the pupils are equal and reactive.  The extraocular movements are full.  There is no scleral icterus.  Mouth and pharynx are benign.  No lymphadenopathy.  No carotid bruits.  The jugular venous pressure is normal.  Thyroid is not enlarged or tender.  Chest is clear to percussion and auscultation.  No rales or rhonchi.  Expansion of the chest is symmetrical.  Heart reveals no abnormal lift or heave.  First and second heart sounds are normal.  There is no murmur gallop rub or click.  The abdomen is soft and nontender.  Bowel sounds are normoactive.  There is no hepatosplenomegaly or mass.  There are no abdominal bruits.  Extremities reveal no phlebitis or edema.  Pedal pulses are weak bilaterally.  There is no cyanosis or clubbing.  Neurologic exam is normal strength and no lateralizing weakness.  No sensory deficits.  Integument reveals no rash EKG shows normal sinus rhythm with biatrial enlargement and poor R-wave progression V1 through V3  Assessment / Plan: Recheck in one year for followup office visit and EKG

## 2011-05-09 DIAGNOSIS — K5289 Other specified noninfective gastroenteritis and colitis: Secondary | ICD-10-CM | POA: Diagnosis not present

## 2011-05-09 DIAGNOSIS — R197 Diarrhea, unspecified: Secondary | ICD-10-CM | POA: Diagnosis not present

## 2011-06-09 ENCOUNTER — Other Ambulatory Visit: Payer: Self-pay | Admitting: Cardiology

## 2011-06-09 NOTE — Telephone Encounter (Signed)
Refilled metoprolol 

## 2011-08-05 DIAGNOSIS — D235 Other benign neoplasm of skin of trunk: Secondary | ICD-10-CM | POA: Diagnosis not present

## 2011-08-05 DIAGNOSIS — L570 Actinic keratosis: Secondary | ICD-10-CM | POA: Diagnosis not present

## 2011-08-11 DIAGNOSIS — Z09 Encounter for follow-up examination after completed treatment for conditions other than malignant neoplasm: Secondary | ICD-10-CM | POA: Diagnosis not present

## 2011-08-11 DIAGNOSIS — Z86010 Personal history of colonic polyps: Secondary | ICD-10-CM | POA: Diagnosis not present

## 2011-11-23 DIAGNOSIS — R7989 Other specified abnormal findings of blood chemistry: Secondary | ICD-10-CM | POA: Diagnosis not present

## 2011-11-23 DIAGNOSIS — R52 Pain, unspecified: Secondary | ICD-10-CM | POA: Diagnosis not present

## 2011-11-23 DIAGNOSIS — J069 Acute upper respiratory infection, unspecified: Secondary | ICD-10-CM | POA: Diagnosis not present

## 2011-11-23 DIAGNOSIS — B9789 Other viral agents as the cause of diseases classified elsewhere: Secondary | ICD-10-CM | POA: Diagnosis not present

## 2011-11-23 DIAGNOSIS — R946 Abnormal results of thyroid function studies: Secondary | ICD-10-CM | POA: Diagnosis not present

## 2011-11-23 DIAGNOSIS — R197 Diarrhea, unspecified: Secondary | ICD-10-CM | POA: Diagnosis not present

## 2011-12-01 DIAGNOSIS — R52 Pain, unspecified: Secondary | ICD-10-CM | POA: Diagnosis not present

## 2011-12-01 DIAGNOSIS — R7989 Other specified abnormal findings of blood chemistry: Secondary | ICD-10-CM | POA: Diagnosis not present

## 2011-12-01 DIAGNOSIS — R799 Abnormal finding of blood chemistry, unspecified: Secondary | ICD-10-CM | POA: Diagnosis not present

## 2011-12-01 DIAGNOSIS — B9789 Other viral agents as the cause of diseases classified elsewhere: Secondary | ICD-10-CM | POA: Diagnosis not present

## 2011-12-01 DIAGNOSIS — R197 Diarrhea, unspecified: Secondary | ICD-10-CM | POA: Diagnosis not present

## 2011-12-01 DIAGNOSIS — J069 Acute upper respiratory infection, unspecified: Secondary | ICD-10-CM | POA: Diagnosis not present

## 2012-01-28 DIAGNOSIS — H2510 Age-related nuclear cataract, unspecified eye: Secondary | ICD-10-CM | POA: Diagnosis not present

## 2012-01-28 DIAGNOSIS — IMO0002 Reserved for concepts with insufficient information to code with codable children: Secondary | ICD-10-CM | POA: Insufficient documentation

## 2012-02-10 DIAGNOSIS — Z0000 Encounter for general adult medical examination without abnormal findings: Secondary | ICD-10-CM | POA: Diagnosis not present

## 2012-02-10 DIAGNOSIS — Z1331 Encounter for screening for depression: Secondary | ICD-10-CM | POA: Diagnosis not present

## 2012-02-10 DIAGNOSIS — Z23 Encounter for immunization: Secondary | ICD-10-CM | POA: Diagnosis not present

## 2012-02-10 DIAGNOSIS — M949 Disorder of cartilage, unspecified: Secondary | ICD-10-CM | POA: Diagnosis not present

## 2012-02-10 DIAGNOSIS — M899 Disorder of bone, unspecified: Secondary | ICD-10-CM | POA: Diagnosis not present

## 2012-02-14 ENCOUNTER — Ambulatory Visit (INDEPENDENT_AMBULATORY_CARE_PROVIDER_SITE_OTHER): Payer: Medicare Other | Admitting: Cardiology

## 2012-02-14 ENCOUNTER — Encounter: Payer: Self-pay | Admitting: Cardiology

## 2012-02-14 VITALS — BP 145/75 | HR 68 | Resp 18 | Ht 66.0 in | Wt 158.0 lb

## 2012-02-14 DIAGNOSIS — R002 Palpitations: Secondary | ICD-10-CM | POA: Diagnosis not present

## 2012-02-14 DIAGNOSIS — F17200 Nicotine dependence, unspecified, uncomplicated: Secondary | ICD-10-CM | POA: Diagnosis not present

## 2012-02-14 DIAGNOSIS — Z720 Tobacco use: Secondary | ICD-10-CM

## 2012-02-14 NOTE — Patient Instructions (Signed)
Your physician recommends that you continue on your current medications as directed. Please refer to the Current Medication list given to you today.  Will have you go for a chest Xray and call with the results ([facility] building across from [facility])  Your physician wants you to follow-up in: 1 year You will receive a reminder letter in the mail two months in advance. If you don't receive a letter, please call our office to schedule the follow-up appointment.

## 2012-02-14 NOTE — Progress Notes (Signed)
Regina James Date of Birth:  06-13-1933 [facility] [address] [place], [place]  [address] [phone]  Fax   [phone]  HPI: This pleasant 78 year old woman is seen for a one-year followup office visit. Has a past history of palpitations. She does not have any history of ischemic heart disease.  She had an echocardiogram October 2010 which showed normal left ventricular systolic function with an ejection fraction of 55-60%.  She did have an impaired relaxation and mild aortic sclerosis with normal pulmonary artery pressure.   Current Outpatient Prescriptions  Medication Sig Dispense Refill  . Calcium-Vitamin D-Vitamin K (CALCIUM SOFT CHEWS) 500-100-40 MG-UNT-MCG CHEW Chew by mouth.      . Doxylamine Succinate, Sleep, (SLEEP AID PO) Take by mouth. At hs       . Multiple Vitamin (MULTIVITAMIN) tablet Take 1 tablet by mouth daily.        . TOPROL XL 100 MG 24 hr tablet TAKE AS DIRECTED.  100 each  1  . [DISCONTINUED] metoprolol (TOPROL-XL) 50 MG 24 hr tablet Take 50 mg by mouth daily.          No Known Allergies  Patient Active Problem List  Diagnosis  . Palpitations  . Claudication  . Tobacco abuse    History  Smoking status  . Current Every Day Smoker  Smokeless tobacco  . Not on file    History  Alcohol Use No    No family history on file.  Review of Systems: The patient denies any heat or cold intolerance.  No weight gain or weight loss.  The patient denies headaches or blurry vision.  There is no cough or sputum production.  The patient denies dizziness.  There is no hematuria or hematochezia.  The patient denies any muscle aches or arthritis.  The patient denies any rash.  The patient denies frequent falling or instability.  There is no history of depression or anxiety.  All other systems were reviewed and are negative.   Physical Exam: Filed Vitals:   02/14/12 1515  BP: 145/75  Pulse: 68  Resp: 18   the general appearance  reveals a well-developed well-nourished woman in no distress.The head and neck exam reveals pupils equal and reactive.  Extraocular movements are full.  There is no scleral icterus.  The mouth and pharynx are normal.  The neck is supple.  The carotids reveal no bruits.  The jugular venous pressure is normal.  The  thyroid is not enlarged.  There is no lymphadenopathy.  The chest is clear to percussion and auscultation.  There are no rales or rhonchi.  Expansion of the chest is symmetrical.  The precordium is quiet.  The first heart sound is normal.  The second heart sound is physiologically split.  There is no murmur gallop rub or click.  There is no abnormal lift or heave.  The abdomen is soft and nontender.  The bowel sounds are normal.  The liver and spleen are not enlarged.  There are no abdominal masses.  There are no abdominal bruits.  Extremities reveal good pedal pulses.  There is no phlebitis or edema.  There is no cyanosis or clubbing.  Strength is normal and symmetrical in all extremities.  There is no lateralizing weakness.  There are no sensory deficits.  The skin is warm and dry.  There is no rash.  EKG today shows normal sinus rhythm and is within normal limits.  She does have an incomplete right bundle branch  block.    Assessment / Plan: The patient is to continue same medication.  She has not had a chest x-ray in 5 years and has a history of smoking and we will get a chest x-ray on her.   She had recent lab work at the time of her annual physical with Dr. Sigmund James Recheck here in one year for followup office visit and EKG

## 2012-02-14 NOTE — Assessment & Plan Note (Signed)
The patient continues to have occasional palpitations.  She is much less symptomatic since starting the beta blocker.  She is not having any chest pain or angina.

## 2012-02-15 ENCOUNTER — Ambulatory Visit (INDEPENDENT_AMBULATORY_CARE_PROVIDER_SITE_OTHER)
Admission: RE | Admit: 2012-02-15 | Discharge: 2012-02-15 | Disposition: A | Discharge status: Home / Self Care | Payer: Medicare Other | Source: Ambulatory Visit | Attending: Cardiology | Admitting: Cardiology

## 2012-02-15 DIAGNOSIS — F17200 Nicotine dependence, unspecified, uncomplicated: Secondary | ICD-10-CM

## 2012-02-15 DIAGNOSIS — I499 Cardiac arrhythmia, unspecified: Secondary | ICD-10-CM | POA: Diagnosis not present

## 2012-02-15 DIAGNOSIS — Z720 Tobacco use: Secondary | ICD-10-CM

## 2012-02-18 ENCOUNTER — Telehealth: Payer: Self-pay | Admitting: *Deleted

## 2012-02-18 ENCOUNTER — Ambulatory Visit: Payer: Medicare Other | Admitting: Cardiology

## 2012-02-18 NOTE — Telephone Encounter (Signed)
Advised patient

## 2012-02-18 NOTE — Telephone Encounter (Signed)
Message copied by Burnell Blanks on Fri Feb 18, 2012  1:35 PM ------      Message from: Cassell Clement      Created: Tue Feb 15, 2012  4:48 PM       Please report.  The chest x-ray shows that the heart size is still normal.  The lungs show some early changes suggesting possible COPD.  Would advise smoking cessation.

## 2012-05-16 DIAGNOSIS — M899 Disorder of bone, unspecified: Secondary | ICD-10-CM | POA: Diagnosis not present

## 2012-05-31 DIAGNOSIS — M7750 Other enthesopathy of unspecified foot: Secondary | ICD-10-CM | POA: Diagnosis not present

## 2012-05-31 DIAGNOSIS — L84 Corns and callosities: Secondary | ICD-10-CM | POA: Diagnosis not present

## 2012-06-15 DIAGNOSIS — J069 Acute upper respiratory infection, unspecified: Secondary | ICD-10-CM | POA: Diagnosis not present

## 2012-06-21 ENCOUNTER — Other Ambulatory Visit: Payer: Self-pay | Admitting: *Deleted

## 2012-06-21 MED ORDER — METOPROLOL SUCCINATE ER 100 MG PO TB24: ORAL_TABLET | ORAL | Status: DC

## 2012-11-22 ENCOUNTER — Other Ambulatory Visit (HOSPITAL_COMMUNITY): Payer: Self-pay | Admitting: Family Medicine

## 2012-11-22 DIAGNOSIS — Z1231 Encounter for screening mammogram for malignant neoplasm of breast: Secondary | ICD-10-CM

## 2012-11-23 DIAGNOSIS — M25569 Pain in unspecified knee: Secondary | ICD-10-CM | POA: Diagnosis not present

## 2012-11-23 DIAGNOSIS — M9420 Chondromalacia, unspecified site: Secondary | ICD-10-CM | POA: Diagnosis not present

## 2012-12-06 ENCOUNTER — Ambulatory Visit (HOSPITAL_COMMUNITY)
Admission: RE | Admit: 2012-12-06 | Discharge: 2012-12-06 | Disposition: A | Discharge status: Home / Self Care | Payer: Medicare Other | Source: Ambulatory Visit | Attending: Family Medicine | Admitting: Family Medicine

## 2012-12-06 DIAGNOSIS — Z1231 Encounter for screening mammogram for malignant neoplasm of breast: Secondary | ICD-10-CM

## 2013-01-10 DIAGNOSIS — H524 Presbyopia: Secondary | ICD-10-CM | POA: Diagnosis not present

## 2013-01-10 DIAGNOSIS — H2510 Age-related nuclear cataract, unspecified eye: Secondary | ICD-10-CM | POA: Diagnosis not present

## 2013-02-05 DIAGNOSIS — Z0000 Encounter for general adult medical examination without abnormal findings: Secondary | ICD-10-CM | POA: Diagnosis not present

## 2013-02-07 DIAGNOSIS — H2510 Age-related nuclear cataract, unspecified eye: Secondary | ICD-10-CM | POA: Diagnosis not present

## 2013-02-12 DIAGNOSIS — F17200 Nicotine dependence, unspecified, uncomplicated: Secondary | ICD-10-CM | POA: Diagnosis not present

## 2013-02-12 DIAGNOSIS — M899 Disorder of bone, unspecified: Secondary | ICD-10-CM | POA: Diagnosis not present

## 2013-02-12 DIAGNOSIS — Z23 Encounter for immunization: Secondary | ICD-10-CM | POA: Diagnosis not present

## 2013-02-12 DIAGNOSIS — I498 Other specified cardiac arrhythmias: Secondary | ICD-10-CM | POA: Diagnosis not present

## 2013-02-12 DIAGNOSIS — Z0000 Encounter for general adult medical examination without abnormal findings: Secondary | ICD-10-CM | POA: Diagnosis not present

## 2013-02-12 DIAGNOSIS — R5381 Other malaise: Secondary | ICD-10-CM | POA: Diagnosis not present

## 2013-02-16 ENCOUNTER — Encounter (INDEPENDENT_AMBULATORY_CARE_PROVIDER_SITE_OTHER): Payer: Self-pay

## 2013-02-16 ENCOUNTER — Ambulatory Visit (INDEPENDENT_AMBULATORY_CARE_PROVIDER_SITE_OTHER): Payer: Medicare Other | Admitting: Cardiology

## 2013-02-16 ENCOUNTER — Encounter: Payer: Self-pay | Admitting: Cardiology

## 2013-02-16 VITALS — BP 126/88 | HR 60 | Ht 66.5 in | Wt 160.0 lb

## 2013-02-16 DIAGNOSIS — R002 Palpitations: Secondary | ICD-10-CM | POA: Diagnosis not present

## 2013-02-16 DIAGNOSIS — R5381 Other malaise: Secondary | ICD-10-CM | POA: Insufficient documentation

## 2013-02-16 DIAGNOSIS — I471 Supraventricular tachycardia: Secondary | ICD-10-CM | POA: Diagnosis not present

## 2013-02-16 DIAGNOSIS — Z720 Tobacco use: Secondary | ICD-10-CM

## 2013-02-16 DIAGNOSIS — F17200 Nicotine dependence, unspecified, uncomplicated: Secondary | ICD-10-CM | POA: Diagnosis not present

## 2013-02-16 NOTE — Patient Instructions (Signed)
Your physician recommends that you continue on your current medications as directed. Please refer to the Current Medication list given to you today.  Your physician wants you to follow-up in: 1 YEAR with Dr Patty Sermons.  You will receive a reminder letter in the mail two months in advance. If you don't receive a letter, please call our office to schedule the follow-up appointment.

## 2013-02-16 NOTE — Assessment & Plan Note (Signed)
She continues to smoke a half-pack cigarettes a day.  She is not having any cough or sputum production.  She had a recent chest x-ray at Dr. Rondel Baton office and is awaiting the results of that.

## 2013-02-16 NOTE — Assessment & Plan Note (Signed)
The patient complains of no energy.  There is a very striking family history of hypothyroidism.  Her father who is now deceased had hypothyroidism.  Her 3 brothers have hypothyroidism.  Her 3 children are all on the thyroid as well. She recently had a TSH level drawn at Dr. Rondel Baton office on 02/12/13 which showed an elevated TSH of 5.16 with normal for that lab being up to 4.50.  She is wondering whether or supplemental thyroid might help her symptoms of malaise and fatigue.  I will defer that to Dr. Hyacinth Meeker.  From the cardiac standpoint there would be no contraindication to trying low dose thyroid replacement.

## 2013-02-16 NOTE — Progress Notes (Signed)
Regina James Date of Birth:  July 30, 1933 [address] [place], [place]  [address] [phone]  Fax   [phone]  HPI: This pleasant 79 year old woman is seen for a one-year followup office visit.  She has a past history of palpitations. She does not have any history of ischemic heart disease.  She had an echocardiogram October 2010 which showed normal left ventricular systolic function with an ejection fraction of 55-60%.  She did have an impaired relaxation and mild aortic sclerosis with normal pulmonary artery pressure.  Since last visit she has been doing well.  She has had no recurrent SVT.  She does complain of increased fatigue and lack of energy.  She continues to smoke a half a pack cigarettes a day.  Her husband smokes a pipe.  Current Outpatient Prescriptions  Medication Sig Dispense Refill  . Calcium-Vitamin D-Vitamin K (CALCIUM SOFT CHEWS) 500-100-40 MG-UNT-MCG CHEW Chew by mouth.      . Doxylamine Succinate, Sleep, (SLEEP AID PO) Take by mouth. At hs       . metoprolol succinate (TOPROL-XL) 100 MG 24 hr tablet Take 100 mg by mouth daily. 1/2 tab      . Multiple Vitamin (MULTIVITAMIN) tablet Take 1 tablet by mouth daily.         No current facility-administered medications for this visit.    No Known Allergies  Patient Active Problem List   Diagnosis Date Noted  . Malaise and fatigue 02/16/2013  . Palpitations 02/17/2011  . Claudication 02/17/2011  . Tobacco abuse 02/17/2011    History  Smoking status  . Current Every Day Smoker  Smokeless tobacco  . Not on file    History  Alcohol Use No    No family history on file.  Review of Systems: The patient denies any heat or cold intolerance.  No weight gain or weight loss.  The patient denies headaches or blurry vision.  There is no cough or sputum production.  The patient denies dizziness.  There is no hematuria or hematochezia.  The patient denies any muscle aches or arthritis.  The patient  denies any rash.  The patient denies frequent falling or instability.  There is no history of depression or anxiety.  All other systems were reviewed and are negative.   Physical Exam: Filed Vitals:   02/16/13 1518  BP: 126/88  Pulse: 60   the general appearance reveals a well-developed well-nourished woman in no distress.The head and neck exam reveals pupils equal and reactive.  Extraocular movements are full.  There is no scleral icterus.  The mouth and pharynx are normal.  The neck is supple.  The carotids reveal no bruits.  The jugular venous pressure is normal.  The  thyroid is not enlarged.  There is no lymphadenopathy.  The chest is clear to percussion and auscultation.  There are no rales or rhonchi.  Expansion of the chest is symmetrical.  The precordium is quiet.  The first heart sound is normal.  The second heart sound is physiologically split.  There is no murmur gallop rub or click.  There is no abnormal lift or heave.  The abdomen is soft and nontender.  The bowel sounds are normal.  The liver and spleen are not enlarged.  There are no abdominal masses.  There are no abdominal bruits.  Extremities reveal good pedal pulses.  There is no phlebitis or edema.  There is no cyanosis or clubbing.  Strength is normal and symmetrical in  all extremities.  There is no lateralizing weakness.  There are no sensory deficits.  The skin is warm and dry.  There is no rash.  EKG from 02/12/13 was reviewed and shows sinus bradycardia and no ischemic changes  She does have an incomplete right bundle branch block.    Assessment / Plan: Continue low-dose Toprol. Defer thyroid workup to Dr. Hyacinth Meeker in regard to patient's complaints of malaise and fatigue. Recheck here in one year for followup office visit and EKG

## 2013-02-16 NOTE — Assessment & Plan Note (Signed)
The patient has not been aware of any palpitations or recurrent SVT

## 2013-02-19 DIAGNOSIS — R946 Abnormal results of thyroid function studies: Secondary | ICD-10-CM | POA: Diagnosis not present

## 2013-04-20 DIAGNOSIS — H2510 Age-related nuclear cataract, unspecified eye: Secondary | ICD-10-CM | POA: Diagnosis not present

## 2013-05-01 DIAGNOSIS — H2510 Age-related nuclear cataract, unspecified eye: Secondary | ICD-10-CM | POA: Diagnosis not present

## 2013-05-01 DIAGNOSIS — I498 Other specified cardiac arrhythmias: Secondary | ICD-10-CM | POA: Diagnosis not present

## 2013-05-01 DIAGNOSIS — I10 Essential (primary) hypertension: Secondary | ICD-10-CM | POA: Diagnosis not present

## 2013-05-01 DIAGNOSIS — H2589 Other age-related cataract: Secondary | ICD-10-CM | POA: Diagnosis not present

## 2013-05-02 DIAGNOSIS — Z961 Presence of intraocular lens: Secondary | ICD-10-CM | POA: Diagnosis not present

## 2013-06-01 DIAGNOSIS — H26499 Other secondary cataract, unspecified eye: Secondary | ICD-10-CM | POA: Insufficient documentation

## 2013-07-12 DIAGNOSIS — H2589 Other age-related cataract: Secondary | ICD-10-CM | POA: Diagnosis not present

## 2013-07-12 DIAGNOSIS — H269 Unspecified cataract: Secondary | ICD-10-CM | POA: Diagnosis not present

## 2013-07-12 DIAGNOSIS — I10 Essential (primary) hypertension: Secondary | ICD-10-CM | POA: Diagnosis not present

## 2013-07-12 DIAGNOSIS — I498 Other specified cardiac arrhythmias: Secondary | ICD-10-CM | POA: Diagnosis not present

## 2013-07-12 DIAGNOSIS — F17200 Nicotine dependence, unspecified, uncomplicated: Secondary | ICD-10-CM | POA: Diagnosis not present

## 2013-07-12 DIAGNOSIS — Z9889 Other specified postprocedural states: Secondary | ICD-10-CM | POA: Diagnosis not present

## 2013-08-19 ENCOUNTER — Emergency Department (HOSPITAL_COMMUNITY): Payer: Medicare Other

## 2013-08-19 ENCOUNTER — Encounter (HOSPITAL_COMMUNITY): Payer: Self-pay | Admitting: Emergency Medicine

## 2013-08-19 ENCOUNTER — Emergency Department (HOSPITAL_COMMUNITY)
Admission: EM | Admit: 2013-08-19 | Discharge: 2013-08-19 | Disposition: A | Discharge status: Home / Self Care | Payer: Medicare Other | Attending: Emergency Medicine | Admitting: Emergency Medicine

## 2013-08-19 DIAGNOSIS — IMO0002 Reserved for concepts with insufficient information to code with codable children: Secondary | ICD-10-CM | POA: Diagnosis not present

## 2013-08-19 DIAGNOSIS — M19039 Primary osteoarthritis, unspecified wrist: Secondary | ICD-10-CM | POA: Diagnosis not present

## 2013-08-19 DIAGNOSIS — L089 Local infection of the skin and subcutaneous tissue, unspecified: Secondary | ICD-10-CM

## 2013-08-19 DIAGNOSIS — Z79899 Other long term (current) drug therapy: Secondary | ICD-10-CM | POA: Diagnosis not present

## 2013-08-19 DIAGNOSIS — L988 Other specified disorders of the skin and subcutaneous tissue: Secondary | ICD-10-CM | POA: Insufficient documentation

## 2013-08-19 DIAGNOSIS — Z8679 Personal history of other diseases of the circulatory system: Secondary | ICD-10-CM | POA: Diagnosis not present

## 2013-08-19 DIAGNOSIS — F17200 Nicotine dependence, unspecified, uncomplicated: Secondary | ICD-10-CM | POA: Insufficient documentation

## 2013-08-19 MED ORDER — OXYCODONE-ACETAMINOPHEN 5-325 MG PO TABS
1.0000 | ORAL_TABLET | Freq: Once | ORAL | Status: AC
  Administered 2013-08-19: 1 via ORAL
  Filled 2013-08-19: qty 1

## 2013-08-19 MED ORDER — OXYCODONE-ACETAMINOPHEN 5-325 MG PO TABS: 1.0000 | ORAL_TABLET | Freq: Four times a day (QID) | ORAL | Status: DC | PRN

## 2013-08-19 MED ORDER — FAMOTIDINE 20 MG PO TABS
20.0000 mg | ORAL_TABLET | Freq: Once | ORAL | Status: AC
  Administered 2013-08-19: 20 mg via ORAL
  Filled 2013-08-19: qty 1

## 2013-08-19 MED ORDER — FAMOTIDINE 20 MG PO TABS: 20.0000 mg | ORAL_TABLET | Freq: Two times a day (BID) | ORAL | Status: DC

## 2013-08-19 NOTE — Discharge Instructions (Signed)
At this time, the irritation on her left wrist appears to be a reaction to potential bug bite.  It does not look infectious, requiring antibiotic.  The x-ray shows no fracture or dislocated bones.  you have  been given 2 prescriptions one for Pepcid, which is a potent antihistamine and 1, for Percocet, which is an anti-inflammatory/ Pain medication.  I would like you to take the antihistamine on a regular basis, and the Percocet as needed, for severe pain.  Please make an appointment with your primary care physician to be rechecked tomorrow if at anytime, you develop fever, worsening pain, or the site looks worse.  Please return to the emergency room for further evaluation

## 2013-08-19 NOTE — ED Provider Notes (Signed)
CSN: [id number]     Arrival date & time 08/19/13  1843 History  This chart was scribed for non-physician practitioner Garald Balding, NP, working with Jasper Riling. Alvino Chapel, MD, by Neta Ehlers, ED Scribe. This patient was seen in room WTR8/WTR8 and the patient's care was started at 8:02 PM.   First MD Initiated Contact with Patient 08/19/13 1950     Chief Complaint  Patient presents with  . Wrist Pain    The history is provided by the patient. No language interpreter was used.   HPI Comments: Regina James is a 80 y.o. female who presents to the Emergency Department complaining of pain to her left wrist which began at noon, approximately an hour after she had been gardening. She states she was wearing rubber gloves while gardening, and she denies any known bites. She rates the pain as 10/10, and she states the pain "feels like I have broken my wrist." The wrist pain is associated with redness, swelling, and heat. She states she can move her fingers, though she experiences pain with thumb movement. She denies itching associated with the pain. The pt has used two Aleve without relief.   Past Medical History  Diagnosis Date  . Palpitations    Past Surgical History  Procedure Laterality Date  . US echocardiography  01/09/2009    EF 55-60%  . Wrist fracture surgery    . Appendectomy    . Cholecystectomy    . Rotator cuff repair     Family History  Problem Relation Age of Onset  . Cancer Father    History  Substance Use Topics  . Smoking status: Current Every Day Smoker    Types: Cigarettes  . Smokeless tobacco: Not on file  . Alcohol Use: 0.6 oz/week    1 Glasses of wine per week   No OB history provided.  Review of Systems  Constitutional: Negative for fever.  Musculoskeletal: Positive for arthralgias (Left wrist).  Skin: Positive for color change.  All other systems reviewed and are negative.     Allergies  Morphine and related  Home Medications   Prior to  Admission medications   Medication Sig Start Date End Date Taking? Authorizing Provider  Calcium-Vitamin D-Vitamin K (CALCIUM SOFT CHEWS) 500-100-40 MG-UNT-MCG CHEW Chew 1 tablet by mouth daily.    Yes Historical Provider, MD  Doxylamine Succinate, Sleep, (SLEEP AID PO) Take 1 tablet by mouth at bedtime.    Yes Historical Provider, MD  glucosamine-chondroitin (GLUCOSAMINE-CHONDROITIN DS) 500-400 MG tablet Take 1 tablet by mouth daily.    Yes Historical Provider, MD  metoprolol succinate (TOPROL-XL) 100 MG 24 hr tablet Take 50 mg by mouth daily. 1/2 tab 06/21/12  Yes Darlin Coco, MD  Multiple Vitamin (MULTIVITAMIN) tablet Take 1 tablet by mouth daily.     Yes Historical Provider, MD  naproxen sodium (ANAPROX) 220 MG tablet Take 440 mg by mouth 2 (two) times daily as needed (pain).   Yes Historical Provider, MD  prednisoLONE acetate (PRED FORTE) 1 % ophthalmic suspension Place 1 drop into the right eye daily.    Yes Historical Provider, MD  Propylene Glycol (SYSTANE BALANCE) 0.6 % SOLN Apply 1 drop to eye 2 (two) times daily as needed (dry eyes).   Yes Historical Provider, MD   Triage Vitals: BP 162/69  Pulse 62  Temp(Src) 98.7 F (37.1 C) (Oral)  Resp 16  SpO2 96%  Physical Exam  Nursing note and vitals reviewed. Constitutional: She is oriented to person, place,  and time. She appears well-developed and well-nourished. No distress.  HENT:  Head: Normocephalic and atraumatic.  Eyes: EOM are normal.  Neck: Neck supple. No tracheal deviation present.  Cardiovascular: Normal rate.   Pulmonary/Chest: Effort normal. No respiratory distress.  Musculoskeletal: Normal range of motion.  Neurological: She is alert and oriented to person, place, and time.  Skin: Skin is warm and dry. There is erythema.  5 by 3 oval, red, non-indurated,warm, tender area over the medial wrist with good distal sensation and pulses. No streaking. No obvious sign of central bite.   Psychiatric: She has a normal mood  and affect. Her behavior is normal.    ED Course  Procedures (including critical care time)  DIAGNOSTIC STUDIES: Oxygen Saturation is 96% on room air, normal by my interpretation.    COORDINATION OF CARE:   8:06 PM- Discussed treatment plan with patient, and the patient agreed to the plan. The plan includes imaging and pain medication.   Labs Review Labs Reviewed - No data to display  Imaging Review No results found.   EKG Interpretation None      MDM  The area appears to be a reactionary, versus infection.  X-rays, checked.  No dislocated or subluxed by the patient.  Has been discharged home with Pepcid as an antihistamine and Percocet as, needed.  She's been instructed to follow up with her primary care physician in the morning to return for any worsening.  Symptoms Final diagnoses:  None    I personally performed the services described in this documentation, which was scribed in my presence. The recorded information has been reviewed and is accurate.   Garald Balding, NP 08/19/13 2100

## 2013-08-19 NOTE — ED Notes (Signed)
Pt is having pain in her left wrist  Pt states it started this morning while she was working in the garden  Pt states she did not see anything bite or sting her  Pt has redness noted and area is hot to touch with mild swelling noted  Pt states she took 2 aleve for the pain without relief  CMS checks to left hand wnl

## 2013-08-20 DIAGNOSIS — M25539 Pain in unspecified wrist: Secondary | ICD-10-CM | POA: Diagnosis not present

## 2013-08-20 NOTE — ED Provider Notes (Signed)
Medical screening examination/treatment/procedure(s) were performed by non-physician practitioner and as supervising physician I was immediately available for consultation/collaboration.   EKG Interpretation None       Jasper Riling. Alvino Chapel, MD 08/20/13 9179

## 2013-08-21 DIAGNOSIS — M25539 Pain in unspecified wrist: Secondary | ICD-10-CM | POA: Diagnosis not present

## 2013-08-21 DIAGNOSIS — IMO0002 Reserved for concepts with insufficient information to code with codable children: Secondary | ICD-10-CM | POA: Diagnosis not present

## 2013-08-22 DIAGNOSIS — IMO0002 Reserved for concepts with insufficient information to code with codable children: Secondary | ICD-10-CM | POA: Diagnosis not present

## 2013-08-22 DIAGNOSIS — M25539 Pain in unspecified wrist: Secondary | ICD-10-CM | POA: Diagnosis not present

## 2013-08-23 DIAGNOSIS — M25539 Pain in unspecified wrist: Secondary | ICD-10-CM | POA: Diagnosis not present

## 2013-08-25 DIAGNOSIS — M25539 Pain in unspecified wrist: Secondary | ICD-10-CM | POA: Diagnosis not present

## 2013-08-28 DIAGNOSIS — L02519 Cutaneous abscess of unspecified hand: Secondary | ICD-10-CM | POA: Diagnosis not present

## 2013-09-08 DIAGNOSIS — M19039 Primary osteoarthritis, unspecified wrist: Secondary | ICD-10-CM | POA: Diagnosis not present

## 2013-09-10 DIAGNOSIS — L02519 Cutaneous abscess of unspecified hand: Secondary | ICD-10-CM | POA: Diagnosis not present

## 2013-09-10 DIAGNOSIS — L03119 Cellulitis of unspecified part of limb: Secondary | ICD-10-CM | POA: Diagnosis not present

## 2013-09-14 DIAGNOSIS — H2640 Unspecified secondary cataract: Secondary | ICD-10-CM | POA: Diagnosis not present

## 2013-10-29 DIAGNOSIS — M24549 Contracture, unspecified hand: Secondary | ICD-10-CM | POA: Diagnosis not present

## 2013-12-03 ENCOUNTER — Other Ambulatory Visit: Payer: Self-pay | Admitting: Cardiology

## 2014-02-25 ENCOUNTER — Ambulatory Visit (INDEPENDENT_AMBULATORY_CARE_PROVIDER_SITE_OTHER): Payer: Medicare Other | Admitting: Cardiology

## 2014-02-25 ENCOUNTER — Telehealth: Payer: Self-pay | Admitting: *Deleted

## 2014-02-25 VITALS — BP 140/76 | HR 57 | Ht 66.0 in | Wt 154.0 lb

## 2014-02-25 DIAGNOSIS — Z720 Tobacco use: Secondary | ICD-10-CM

## 2014-02-25 DIAGNOSIS — R5381 Other malaise: Secondary | ICD-10-CM

## 2014-02-25 DIAGNOSIS — I471 Supraventricular tachycardia: Secondary | ICD-10-CM

## 2014-02-25 DIAGNOSIS — R5383 Other fatigue: Secondary | ICD-10-CM | POA: Diagnosis not present

## 2014-02-25 DIAGNOSIS — R002 Palpitations: Secondary | ICD-10-CM

## 2014-02-25 NOTE — Assessment & Plan Note (Signed)
Her energy levels have improved since last visit.

## 2014-02-25 NOTE — Assessment & Plan Note (Signed)
Patient continues to smoke about 5 or 6 cigarettes a day.  She has had a recent cough.

## 2014-02-25 NOTE — Assessment & Plan Note (Signed)
The patient has not had any recurrence of her SVT.  She does not get much aerobic exercise now.  She used to play tennis.  Now she plays golf about once a week.  She is playing a lot of bridge with her lady friends.

## 2014-02-25 NOTE — Progress Notes (Signed)
Regina James Date of Birth:  July 15, 1933 [facility] [address] [place], [place]  [address] [phone]        Fax   [phone]   History of Present Illness: This pleasant 80 year old woman is seen for a one-year follow-up office visit.  She has a past history of paroxysmal supraventricular tachycardia.  He has a past history of insomnia.  She has had a past history of dyspepsia.  She does have a history of tobacco abuse.She does not have any history of ischemic heart disease. She had an echocardiogram October 2010 which showed normal left ventricular systolic function with an ejection fraction of 55-60%. She did have an impaired relaxation and mild aortic sclerosis with normal pulmonary artery pressure  Current Outpatient Prescriptions  Medication Sig Dispense Refill  . aspirin 81 MG tablet Take 81 mg by mouth daily.    . Doxylamine Succinate, Sleep, (SLEEP AID PO) Take 1 tablet by mouth at bedtime.     Marland Kitchen glucosamine-chondroitin (GLUCOSAMINE-CHONDROITIN DS) 500-400 MG tablet Take 1 tablet by mouth daily.     . metoprolol succinate (TOPROL-XL) 100 MG 24 hr tablet TAKE AS DIRECTED. 45 tablet 1  . Multiple Vitamin (MULTIVITAMIN) tablet Take 1 tablet by mouth daily.       No current facility-administered medications for this visit.    Allergies  Allergen Reactions  . Morphine And Related Swelling  . Levofloxacin Anxiety    Double vision    Patient Active Problem List   Diagnosis Date Noted  . Malaise and fatigue 02/16/2013  . Palpitations 02/17/2011  . Claudication 02/17/2011  . Tobacco abuse 02/17/2011    History  Smoking status  . Current Every Day Smoker  . Types: Cigarettes  Smokeless tobacco  . Not on file    History  Alcohol Use  . 0.6 oz/week  . 1 Glasses of wine per week    Family History  Problem Relation Age of Onset  . Cancer Father     Review of Systems: Constitutional: no fever chills diaphoresis or fatigue or  change in weight.  Head and neck: no hearing loss, no epistaxis, no photophobia or visual disturbance. Respiratory: No cough, shortness of breath or wheezing. Cardiovascular: No chest pain peripheral edema, palpitations. Gastrointestinal: No abdominal distention, no abdominal pain, no change in bowel habits hematochezia or melena. Genitourinary: No dysuria, no frequency, no urgency, no nocturia. Musculoskeletal:No arthralgias, no back pain, no gait disturbance or myalgias. Neurological: No dizziness, no headaches, no numbness, no seizures, no syncope, no weakness, no tremors. Hematologic: No lymphadenopathy, no easy bruising. Psychiatric: No confusion, no hallucinations, no sleep disturbance.   Wt Readings from Last 3 Encounters:  02/25/14 154 lb (69.854 kg)  02/16/13 160 lb (72.576 kg)  02/14/12 158 lb (71.668 kg)    Physical Exam: Filed Vitals:   02/25/14 1535  BP: 140/76  Pulse: 57  The patient appears to be in no distress.  Head and neck exam reveals that the pupils are equal and reactive.  The extraocular movements are full.  There is no scleral icterus.  Mouth and pharynx are benign.  No lymphadenopathy.  No carotid bruits.  The jugular venous pressure is normal.  Thyroid is not enlarged or tender.  Chest is clear to percussion and auscultation.  No rales or rhonchi.  Expansion of the chest is symmetrical.  Heart reveals no abnormal lift or heave.  First and second heart sounds are normal.  There is no murmur gallop  rub or click.  The abdomen is soft and nontender.  Bowel sounds are normoactive.  There is no hepatosplenomegaly or mass.  There are no abdominal bruits.  Extremities reveal no phlebitis or edema.  Pedal pulses are good.  There is no cyanosis or clubbing.  Neurologic exam is normal strength and no lateralizing weakness.  No sensory deficits.  Integument reveals no rash  EKG today shows sinus bradycardia and no ischemic changes  Assessment / Plan: 1.   History of paroxysmal SVT 2.  Tobacco abuse 3.  Insomnia  Disposition: Continue same metoprolol.  Advised to reduce smoking further.  Recheck in one year for office visit and EKG.

## 2014-02-25 NOTE — Telephone Encounter (Signed)
Patient seen for ov today and requested refill on Metoprolol to [facility].  Patient had left before realizing her sig not specific on how she is taking Left message to call back

## 2014-02-25 NOTE — Patient Instructions (Signed)
Your physician recommends that you continue on your current medications as directed. Please refer to the Current Medication list given to you today.  Your physician wants you to follow-up in: 1 YEAR OV You will receive a reminder letter in the mail two months in advance. If you don't receive a letter, please call our office to schedule the follow-up appointment.  

## 2014-02-25 NOTE — Progress Notes (Deleted)
     Jeannette Corpus Date of Birth:  10/16/1933 [place] [address] [place], [place]  [address] [phone]  Fax   [phone]  HPI:   Current Outpatient Prescriptions  Medication Sig Dispense Refill  . aspirin 81 MG tablet Take 81 mg by mouth daily.    . Doxylamine Succinate, Sleep, (SLEEP AID PO) Take 1 tablet by mouth at bedtime.     Marland Kitchen glucosamine-chondroitin (GLUCOSAMINE-CHONDROITIN DS) 500-400 MG tablet Take 1 tablet by mouth daily.     . metoprolol succinate (TOPROL-XL) 100 MG 24 hr tablet TAKE AS DIRECTED. 45 tablet 1  . Multiple Vitamin (MULTIVITAMIN) tablet Take 1 tablet by mouth daily.       No current facility-administered medications for this visit.    Allergies  Allergen Reactions  . Morphine And Related Swelling  . Levofloxacin Anxiety    Double vision    Patient Active Problem List   Diagnosis Date Noted  . Malaise and fatigue 02/16/2013  . Palpitations 02/17/2011  . Claudication 02/17/2011  . Tobacco abuse 02/17/2011    History  Smoking status  . Current Every Day Smoker  . Types: Cigarettes  Smokeless tobacco  . Not on file    History  Alcohol Use  . 0.6 oz/week  . 1 Glasses of wine per week    Family History  Problem Relation Age of Onset  . Cancer Father     Review of Systems: The patient denies any heat or cold intolerance.  No weight gain or weight loss.  The patient denies headaches or blurry vision.  There is no cough or sputum production.  The patient denies dizziness.  There is no hematuria or hematochezia.  The patient denies any muscle aches or arthritis.  The patient denies any rash.  The patient denies frequent falling or instability.  There is no history of depression or anxiety.  All other systems were reviewed and are negative.   Wt Readings from Last 3 Encounters:  02/25/14 154 lb (69.854 kg)  02/16/13 160 lb (72.576 kg)  02/14/12 158 lb (71.668 kg)    Physical Exam: Filed Vitals:   02/25/14 1535  BP: 140/76  Pulse: 57      Assessment / Plan:

## 2014-02-26 NOTE — Telephone Encounter (Signed)
F/u   Patient is returning call. Please contact at [phone].

## 2014-02-27 MED ORDER — METOPROLOL SUCCINATE ER 100 MG PO TB24: ORAL_TABLET | ORAL | Status: DC

## 2014-02-27 NOTE — Telephone Encounter (Signed)
Spoke with patient and she is taking metoprolol 100 mg 1/2 daily, rx sent to pharmacy

## 2014-03-24 DIAGNOSIS — J209 Acute bronchitis, unspecified: Secondary | ICD-10-CM | POA: Diagnosis not present

## 2014-03-24 DIAGNOSIS — Z720 Tobacco use: Secondary | ICD-10-CM | POA: Diagnosis not present

## 2014-04-17 DIAGNOSIS — Z23 Encounter for immunization: Secondary | ICD-10-CM | POA: Diagnosis not present

## 2014-04-17 DIAGNOSIS — I739 Peripheral vascular disease, unspecified: Secondary | ICD-10-CM | POA: Diagnosis not present

## 2014-04-17 DIAGNOSIS — I498 Other specified cardiac arrhythmias: Secondary | ICD-10-CM | POA: Diagnosis not present

## 2014-04-17 DIAGNOSIS — Z79899 Other long term (current) drug therapy: Secondary | ICD-10-CM | POA: Diagnosis not present

## 2014-04-17 DIAGNOSIS — D485 Neoplasm of uncertain behavior of skin: Secondary | ICD-10-CM | POA: Diagnosis not present

## 2014-04-17 DIAGNOSIS — Z0001 Encounter for general adult medical examination with abnormal findings: Secondary | ICD-10-CM | POA: Diagnosis not present

## 2014-04-17 DIAGNOSIS — F17210 Nicotine dependence, cigarettes, uncomplicated: Secondary | ICD-10-CM | POA: Diagnosis not present

## 2014-04-17 DIAGNOSIS — Z13220 Encounter for screening for lipoid disorders: Secondary | ICD-10-CM | POA: Diagnosis not present

## 2014-04-17 DIAGNOSIS — M859 Disorder of bone density and structure, unspecified: Secondary | ICD-10-CM | POA: Diagnosis not present

## 2014-04-18 ENCOUNTER — Other Ambulatory Visit: Payer: Self-pay | Admitting: Family Medicine

## 2014-04-18 DIAGNOSIS — F17200 Nicotine dependence, unspecified, uncomplicated: Secondary | ICD-10-CM

## 2014-04-19 ENCOUNTER — Other Ambulatory Visit: Payer: Self-pay | Admitting: Family Medicine

## 2014-04-19 DIAGNOSIS — I739 Peripheral vascular disease, unspecified: Secondary | ICD-10-CM

## 2014-04-22 ENCOUNTER — Other Ambulatory Visit: Payer: Self-pay | Admitting: Family Medicine

## 2014-04-22 DIAGNOSIS — I739 Peripheral vascular disease, unspecified: Secondary | ICD-10-CM

## 2014-05-02 ENCOUNTER — Other Ambulatory Visit: Payer: Self-pay | Admitting: Family Medicine

## 2014-05-02 DIAGNOSIS — F17200 Nicotine dependence, unspecified, uncomplicated: Secondary | ICD-10-CM

## 2014-05-03 ENCOUNTER — Ambulatory Visit
Admission: RE | Admit: 2014-05-03 | Discharge: 2014-05-03 | Disposition: A | Discharge status: Home / Self Care | Payer: Medicare Other | Source: Ambulatory Visit | Attending: Family Medicine | Admitting: Family Medicine

## 2014-05-03 DIAGNOSIS — I739 Peripheral vascular disease, unspecified: Secondary | ICD-10-CM

## 2014-05-03 DIAGNOSIS — F17200 Nicotine dependence, unspecified, uncomplicated: Secondary | ICD-10-CM

## 2014-05-03 DIAGNOSIS — J439 Emphysema, unspecified: Secondary | ICD-10-CM | POA: Diagnosis not present

## 2014-05-03 DIAGNOSIS — J984 Other disorders of lung: Secondary | ICD-10-CM | POA: Diagnosis not present

## 2014-05-03 DIAGNOSIS — R918 Other nonspecific abnormal finding of lung field: Secondary | ICD-10-CM | POA: Diagnosis not present

## 2014-05-03 DIAGNOSIS — Z8541 Personal history of malignant neoplasm of cervix uteri: Secondary | ICD-10-CM | POA: Diagnosis not present

## 2014-05-07 ENCOUNTER — Other Ambulatory Visit: Payer: Self-pay | Admitting: Dermatology

## 2014-05-07 DIAGNOSIS — Z85828 Personal history of other malignant neoplasm of skin: Secondary | ICD-10-CM | POA: Diagnosis not present

## 2014-05-07 DIAGNOSIS — C44719 Basal cell carcinoma of skin of left lower limb, including hip: Secondary | ICD-10-CM | POA: Diagnosis not present

## 2014-05-08 ENCOUNTER — Other Ambulatory Visit: Payer: Self-pay | Admitting: Family Medicine

## 2014-05-08 DIAGNOSIS — E041 Nontoxic single thyroid nodule: Secondary | ICD-10-CM

## 2014-05-08 DIAGNOSIS — I7789 Other specified disorders of arteries and arterioles: Secondary | ICD-10-CM | POA: Diagnosis not present

## 2014-05-08 DIAGNOSIS — R918 Other nonspecific abnormal finding of lung field: Secondary | ICD-10-CM | POA: Diagnosis not present

## 2014-05-10 ENCOUNTER — Other Ambulatory Visit: Payer: Self-pay | Admitting: *Deleted

## 2014-05-10 DIAGNOSIS — I739 Peripheral vascular disease, unspecified: Secondary | ICD-10-CM

## 2014-05-14 ENCOUNTER — Ambulatory Visit
Admission: RE | Admit: 2014-05-14 | Discharge: 2014-05-14 | Disposition: A | Discharge status: Home / Self Care | Payer: Medicare Other | Source: Ambulatory Visit | Attending: Family Medicine | Admitting: Family Medicine

## 2014-05-14 DIAGNOSIS — E041 Nontoxic single thyroid nodule: Secondary | ICD-10-CM | POA: Diagnosis not present

## 2014-05-22 ENCOUNTER — Encounter: Payer: Self-pay | Admitting: Vascular Surgery

## 2014-05-23 ENCOUNTER — Ambulatory Visit (INDEPENDENT_AMBULATORY_CARE_PROVIDER_SITE_OTHER): Payer: Medicare Other | Admitting: Vascular Surgery

## 2014-05-23 ENCOUNTER — Encounter: Payer: Self-pay | Admitting: Vascular Surgery

## 2014-05-23 ENCOUNTER — Ambulatory Visit (HOSPITAL_COMMUNITY)
Admission: RE | Admit: 2014-05-23 | Discharge: 2014-05-23 | Disposition: A | Discharge status: Home / Self Care | Payer: Medicare Other | Source: Ambulatory Visit | Attending: Vascular Surgery | Admitting: Vascular Surgery

## 2014-05-23 VITALS — BP 143/72 | HR 56 | Ht 65.0 in | Wt 161.0 lb

## 2014-05-23 DIAGNOSIS — I739 Peripheral vascular disease, unspecified: Secondary | ICD-10-CM

## 2014-05-23 DIAGNOSIS — F17210 Nicotine dependence, cigarettes, uncomplicated: Secondary | ICD-10-CM | POA: Diagnosis not present

## 2014-05-23 NOTE — Progress Notes (Signed)
[facility] [place] HISTORY AND PHYSICAL   History of Present Illness:  Patient is a 81 y.o. year old female who presents for evaluation of lower extremity claudication. The patient states she has had approximately a two-year history of a cramping and tight sensation in her calf on both sides after walking about a quarter mile. She denies rest pain. She denies ulcers on her feet. She denies any nonhealing wounds. She does smoke approximately 1 pack of cigarettes per day. Greater than 3 minutes today spent regarding smoking cessation counseling. She denies prior history of myocardial infarction or coronary artery disease. She does have a remote history of atrial fibrillation which she states has been controlled with Toprol. She has not been in atrial fibrillation recently. She is followed by Dr. Mare Ferrari. She denies prior history of stroke..  Other medical problems include multiple skin basal cell carcinomas.  Past Medical History  Diagnosis Date  . Palpitations   . Atrial fibrillation   . Cancer     basal cell carcinoma    Past Surgical History  Procedure Laterality Date  . US echocardiography  01/09/2009    EF 55-60%  . Wrist fracture surgery    . Appendectomy    . Cholecystectomy    . Rotator cuff repair      Social History History  Substance Use Topics  . Smoking status: Current Every Day Smoker -- 45 years    Types: Cigarettes  . Smokeless tobacco: Not on file  . Alcohol Use: 4.2 oz/week    7 Glasses of wine per week    Family History Family History  Problem Relation Age of Onset  . Cancer Father   . Diabetes Father   . Varicose Veins Mother     Allergies  Allergies  Allergen Reactions  . Morphine And Related Swelling  . Levofloxacin Anxiety    Double vision     Current Outpatient Prescriptions  Medication Sig Dispense Refill  . aspirin 81 MG tablet Take 81 mg by mouth daily.    . Doxylamine Succinate, Sleep, (SLEEP AID PO) Take 1 tablet  by mouth at bedtime.     Marland Kitchen glucosamine-chondroitin (GLUCOSAMINE-CHONDROITIN DS) 500-400 MG tablet Take 1 tablet by mouth daily.     . metoprolol succinate (TOPROL-XL) 100 MG 24 hr tablet 1/2 tablet daily or as directed 45 tablet 3  . Multiple Vitamin (MULTIVITAMIN) tablet Take 1 tablet by mouth daily.       No current facility-administered medications for this visit.    ROS:   General:  No weight loss, Fever, chills  HEENT: No recent headaches, no nasal bleeding, no visual changes, no sore throat  Neurologic: No dizziness, blackouts, seizures. No recent symptoms of stroke or mini- stroke. No recent episodes of slurred speech, or temporary blindness.  Cardiac: No recent episodes of chest pain/pressure, no shortness of breath at rest.  No shortness of breath with exertion.  Denies history of atrial fibrillation or irregular heartbeat  Vascular: No history of rest pain in feet.  No history of claudication.  No history of non-healing ulcer, No history of DVT   Pulmonary: No home oxygen, no productive cough, no hemoptysis,  No asthma or wheezing  Musculoskeletal:  [ ]  Arthritis, [ ]  Low back pain,  [ ]  Joint pain  Hematologic:No history of hypercoagulable state.  No history of easy bleeding.  No history of anemia  Gastrointestinal: No hematochezia or melena,  No gastroesophageal reflux, no trouble swallowing  Urinary: [ ]   chronic Kidney disease, [ ]  on HD - [ ]  MWF or [ ]  TTHS, [ ]  Burning with urination, [ ]  Frequent urination, [ ]  Difficulty urinating;   Skin: No rashes  Psychological: No history of anxiety,  No history of depression   Physical Examination  Filed Vitals:   05/23/14 0953  BP: 143/72  Pulse: 56  Height: 5\' 5"  (1.651 m)  Weight: 161 lb (73.029 kg)  SpO2: 98%    Body mass index is 26.79 kg/(m^2).  General:  Alert and oriented, no acute distress HEENT: Normal Neck: No bruit or JVD Pulmonary: Clear to auscultation bilaterally Cardiac: Regular Rate and  Rhythm without murmur Abdomen: Soft, non-tender, non-distended, no mass Skin: No rash Extremity Pulses:  2+ radial, brachial, femoral, absent popliteal dorsalis pedis, posterior tibial pulses bilaterally Musculoskeletal: No deformity or edema  Neurologic: Upper and lower extremity motor 5/5 and symmetric  DATA:  Patient had bilateral ABIs performed on 05/03/2014 by [facility] radiology. Right side was 0.71 left 0.69. The patient had a duplex ultrasound in our vascular lab today. This shows a chronic left superficial femoral artery occlusion approximately 20 cm in length. She has 2 areas of high-grade stenosis in the right superficial femoral artery.   ASSESSMENT:  Bilateral lower extremity claudication. The patient has a chronic left superficial femoral artery occlusion and a mid to high-grade stenosis of the right superficial femoral artery which definitely explains her symptoms. I discussed with the patient today smoking cessation and that her risk of limb loss lifetime is less than 5% if she is able to quit smoking. I also discussed with her today graduated exercise program of walking 30 minutes daily to improve collateralization and muscle tolerance. I also discussed with her the possibility of an arteriogram possible intervention if her claudication symptoms become disabling to her. She has opted for conservative management with a walking program and smoking cessation for now.   PLAN:  She will walk 30 minutes daily. She will try to quit smoking. She will follow-up with repeat ABIs in 6 months time. She'll call us sooner if she wishes to have an intervention with possible arteriogram.  Ruta Hinds, MD [facility] [place]: [phone] Pager: [phone]

## 2014-06-19 DIAGNOSIS — R55 Syncope and collapse: Secondary | ICD-10-CM | POA: Diagnosis not present

## 2014-10-02 ENCOUNTER — Ambulatory Visit: Payer: Medicare Other | Admitting: Cardiology

## 2014-11-28 ENCOUNTER — Ambulatory Visit: Payer: Medicare Other | Admitting: Vascular Surgery

## 2014-11-28 ENCOUNTER — Encounter (HOSPITAL_COMMUNITY): Payer: Medicare Other

## 2015-01-07 DIAGNOSIS — I739 Peripheral vascular disease, unspecified: Secondary | ICD-10-CM | POA: Insufficient documentation

## 2015-01-08 ENCOUNTER — Encounter: Payer: Self-pay | Admitting: Cardiology

## 2015-01-08 ENCOUNTER — Ambulatory Visit (INDEPENDENT_AMBULATORY_CARE_PROVIDER_SITE_OTHER): Payer: Medicare Other | Admitting: Cardiology

## 2015-01-08 VITALS — BP 134/68 | HR 55 | Ht 66.5 in | Wt 157.1 lb

## 2015-01-08 DIAGNOSIS — Z720 Tobacco use: Secondary | ICD-10-CM | POA: Diagnosis not present

## 2015-01-08 DIAGNOSIS — R002 Palpitations: Secondary | ICD-10-CM | POA: Diagnosis not present

## 2015-01-08 DIAGNOSIS — I471 Supraventricular tachycardia: Secondary | ICD-10-CM | POA: Diagnosis not present

## 2015-01-08 MED ORDER — METOPROLOL TARTRATE 100 MG PO TABS: ORAL_TABLET | ORAL | Status: DC

## 2015-01-08 NOTE — Patient Instructions (Signed)
Medication Instructions:  Your physician recommends that you continue on your current medications as directed. Please refer to the Current Medication list given to you today.  Labwork: NONE  Testing/Procedures: NONE  Follow-Up: Your physician wants you to follow-up in: 1 YEAR OV You will receive a reminder letter in the mail two months in advance. If you don't receive a letter, please call our office to schedule the follow-up appointment.     

## 2015-01-08 NOTE — Progress Notes (Signed)
Cardiology Office Note   Date:  01/08/2015   ID:  Regina James, DOB [birth year], MRN [id number]  PCP:  Tawanna Solo, MD  Cardiologist: Darlin Coco MD  No chief complaint on file.     History of Present Illness: Regina James is a 81 y.o. female who presents for a one-year follow-up visit  This pleasant 81 year old woman is seen for a one-year follow-up office visit. She has a past history of paroxysmal supraventricular tachycardia. He has a past history of insomnia. She has had a past history of dyspepsia. She does have a history of tobacco abuse.She does not have any history of ischemic heart disease. She had an echocardiogram October 2010 which showed normal left ventricular systolic function with an ejection fraction of 55-60%. She did have an impaired relaxation and mild aortic sclerosis with normal pulmonary artery pressure. Since last visit she has been feeling well.  She denies any recurrent SVT or palpitations.  She has not been having any chest pain.  Her exercise consists mainly of playing golf. She still smokes about 2-3 cigarettes a day.  She is gradually cutting back has we had asked her to.  Past Medical History  Diagnosis Date  . Palpitations   . Atrial fibrillation ([place])   . Cancer ([place])     basal cell carcinoma    Past Surgical History  Procedure Laterality Date  . US echocardiography  01/09/2009    EF 55-60%  . Wrist fracture surgery    . Appendectomy    . Cholecystectomy    . Rotator cuff repair       Current Outpatient Prescriptions  Medication Sig Dispense Refill  . aspirin 81 MG tablet Take 81 mg by mouth daily.    . Doxylamine Succinate, Sleep, (SLEEP AID PO) Take 1 tablet by mouth at bedtime.     Marland Kitchen glucosamine-chondroitin (GLUCOSAMINE-CHONDROITIN DS) 500-400 MG tablet Take 1 tablet by mouth daily.     . metoprolol (LOPRESSOR) 100 MG tablet 1/2 TABLET ONCE DAILY 180 tablet 3  . Multiple Vitamin (MULTIVITAMIN) tablet Take 1  tablet by mouth daily.      Marland Kitchen OVER THE COUNTER MEDICATION      No current facility-administered medications for this visit.    Allergies:   Buprenorphine hcl; Morphine and related; and Levofloxacin    Social History:  The patient  reports that she has been smoking Cigarettes.  She has smoked for the past 45 years. She does not have any smokeless tobacco history on file. She reports that she drinks about 4.2 oz of alcohol per week. She reports that she does not use illicit drugs.   Family History:  The patient's family history includes Cancer in her father; Diabetes in her father; Varicose Veins in her mother.    ROS:  Please see the history of present illness.   Otherwise, review of systems are positive for none.   All other systems are reviewed and negative.    PHYSICAL EXAM: VS:  BP 134/68 mmHg  Pulse 55  Ht 5' 6.5" (1.689 m)  Wt 157 lb 1.9 oz (71.269 kg)  BMI 24.98 kg/m2 , BMI Body mass index is 24.98 kg/(m^2). GEN: Well nourished, well developed, in no acute distress HEENT: normal Neck: no JVD, carotid bruits, or masses Cardiac: RRR; no murmurs, rubs, or gallops,no edema  Respiratory:  clear to auscultation bilaterally, normal work of breathing GI: soft, nontender, nondistended, + BS MS: no deformity or atrophy Skin: warm and dry, no  rash Neuro:  Strength and sensation are intact Psych: euthymic mood, full affect   EKG:  EKG is ordered today. The ekg ordered today demonstrates sinus bradycardia at 55 bpm.  Otherwise within normal limits.   Recent Labs: No results found for requested labs within last 365 days.    Lipid Panel No results found for: CHOL, TRIG, HDL, CHOLHDL, VLDL, LDLCALC, LDLDIRECT    Wt Readings from Last 3 Encounters:  01/08/15 157 lb 1.9 oz (71.269 kg)  05/23/14 161 lb (73.029 kg)  02/25/14 154 lb (69.854 kg)         ASSESSMENT AND PLAN:  1. History of paroxysmal SVT 2. Tobacco abuse 3. Insomnia   Current medicines are reviewed  at length with the patient today.  The patient does not have concerns regarding medicines.  The following changes have been made:  no change  Labs/ tests ordered today include:   Orders Placed This Encounter  Procedures  . EKG 12-Lead    Disposition: She was again urged to quit smoking.  She will continue current medication.  Recheck in one year for office visit and EKG  Signed, Darlin Coco MD 01/08/2015 5:32 PM    [address], [place], [place]  [address] Phone: [phone]; Fax: [phone]

## 2015-03-26 ENCOUNTER — Other Ambulatory Visit: Payer: Self-pay

## 2015-03-26 MED ORDER — METOPROLOL TARTRATE 100 MG PO TABS: ORAL_TABLET | ORAL | Status: DC

## 2015-03-27 ENCOUNTER — Other Ambulatory Visit: Payer: Self-pay

## 2015-03-27 ENCOUNTER — Telehealth: Payer: Self-pay

## 2015-03-27 MED ORDER — METOPROLOL SUCCINATE ER 100 MG PO TB24
ORAL_TABLET | ORAL | Status: DC
Start: 2015-03-27 — End: 2015-05-28

## 2015-03-27 NOTE — Telephone Encounter (Signed)
She should be on Toprol 100 mg 1/2 tab daily

## 2015-03-27 NOTE — Telephone Encounter (Signed)
error 

## 2015-04-21 DIAGNOSIS — Z0000 Encounter for general adult medical examination without abnormal findings: Secondary | ICD-10-CM | POA: Diagnosis not present

## 2015-04-21 DIAGNOSIS — R946 Abnormal results of thyroid function studies: Secondary | ICD-10-CM | POA: Diagnosis not present

## 2015-04-21 DIAGNOSIS — M859 Disorder of bone density and structure, unspecified: Secondary | ICD-10-CM | POA: Diagnosis not present

## 2015-04-21 DIAGNOSIS — Z131 Encounter for screening for diabetes mellitus: Secondary | ICD-10-CM | POA: Diagnosis not present

## 2015-04-21 DIAGNOSIS — E559 Vitamin D deficiency, unspecified: Secondary | ICD-10-CM | POA: Diagnosis not present

## 2015-04-21 DIAGNOSIS — Z23 Encounter for immunization: Secondary | ICD-10-CM | POA: Diagnosis not present

## 2015-04-21 DIAGNOSIS — E782 Mixed hyperlipidemia: Secondary | ICD-10-CM | POA: Diagnosis not present

## 2015-04-21 DIAGNOSIS — F17210 Nicotine dependence, cigarettes, uncomplicated: Secondary | ICD-10-CM | POA: Diagnosis not present

## 2015-04-21 DIAGNOSIS — I498 Other specified cardiac arrhythmias: Secondary | ICD-10-CM | POA: Diagnosis not present

## 2015-04-30 DIAGNOSIS — M67912 Unspecified disorder of synovium and tendon, left shoulder: Secondary | ICD-10-CM | POA: Diagnosis not present

## 2015-05-28 ENCOUNTER — Encounter: Payer: Self-pay | Admitting: Cardiology

## 2015-05-28 ENCOUNTER — Ambulatory Visit (INDEPENDENT_AMBULATORY_CARE_PROVIDER_SITE_OTHER): Payer: Medicare Other | Admitting: Cardiology

## 2015-05-28 VITALS — BP 130/80 | HR 68 | Ht 66.0 in | Wt 160.1 lb

## 2015-05-28 DIAGNOSIS — I471 Supraventricular tachycardia, unspecified: Secondary | ICD-10-CM

## 2015-05-28 DIAGNOSIS — R002 Palpitations: Secondary | ICD-10-CM

## 2015-05-28 NOTE — Progress Notes (Signed)
Cardiology Office Note   Date:  05/28/2015   ID:  Regina James, DOB 06-08-1933, MRN [id number]  PCP:  Tawanna Solo, MD  Cardiologist: Darlin Coco MD  Chief Complaint  Patient presents with  . scheduled follow up      History of Present Illness: Regina James is a 82 y.o. female who presents for follow-up visit  This pleasant 82 year old woman is seen for a follow-up office visit. She has a past history of paroxysmal supraventricular tachycardia. He has a past history of insomnia. She has had a past history of dyspepsia. She does have a history of tobacco abuse.She does not have any history of ischemic heart disease. She had an echocardiogram October 2010 which showed normal left ventricular systolic function with an ejection fraction of 55-60%. She did have an impaired relaxation and mild aortic sclerosis with normal pulmonary artery pressure. Since last visit she has been feeling well. She denies any recurrent SVT or palpitations. She has not been having any chest pain. Her exercise consists mainly of playing golf. She still smokes abou 3-5 cigarettes a day. She is gradually cutting back has we had asked her to.  Past Medical History  Diagnosis Date  . Palpitations   . Atrial fibrillation ([place])   . Cancer ([place])     basal cell carcinoma    Past Surgical History  Procedure Laterality Date  . US echocardiography  01/09/2009    EF 55-60%  . Wrist fracture surgery    . Appendectomy    . Cholecystectomy    . Rotator cuff repair       Current Outpatient Prescriptions  Medication Sig Dispense Refill  . aspirin 81 MG tablet Take 81 mg by mouth daily.    . Doxylamine Succinate, Sleep, (SLEEP AID PO) Take 1 tablet by mouth at bedtime.     Marland Kitchen glucosamine-chondroitin (GLUCOSAMINE-CHONDROITIN DS) 500-400 MG tablet Take 1 tablet by mouth daily.     . metoprolol succinate (TOPROL-XL) 100 MG 24 hr tablet Take 50 mg by mouth daily. Take with or immediately  following a meal.    . Multiple Vitamin (MULTIVITAMIN) tablet Take 1 tablet by mouth daily.       No current facility-administered medications for this visit.    Allergies:   Buprenorphine hcl; Morphine and related; and Levofloxacin    Social History:  The patient  reports that she has been smoking Cigarettes.  She has smoked for the past 45 years. She does not have any smokeless tobacco history on file. She reports that she drinks about 4.2 oz of alcohol per week. She reports that she does not use illicit drugs.   Family History:  The patient's family history includes Cancer in her father; Diabetes in her father; Varicose Veins in her mother.    ROS:  Please see the history of present illness.   Otherwise, review of systems are positive for none.   All other systems are reviewed and negative.    PHYSICAL EXAM: VS:  BP 130/80 mmHg  Pulse 68  Ht 5\' 6"  (1.676 m)  Wt 160 lb 1.9 oz (72.63 kg)  BMI 25.86 kg/m2  SpO2 98% , BMI Body mass index is 25.86 kg/(m^2). GEN: Well nourished, well developed, in no acute distress HEENT: normal Neck: no JVD, carotid bruits, or masses Cardiac: RRR; no murmurs, rubs, or gallops,no edema  Respiratory:  clear to auscultation bilaterally, normal work of breathing GI: soft, nontender, nondistended, + BS MS: no deformity or atrophy  Skin: warm and dry, no rash Neuro:  Strength and sensation are intact Psych: euthymic mood, full affect   EKG:  EKG is not ordered today.    Recent Labs: No results found for requested labs within last 365 days.    Lipid Panel No results found for: CHOL, TRIG, HDL, CHOLHDL, VLDL, LDLCALC, LDLDIRECT    Wt Readings from Last 3 Encounters:  05/28/15 160 lb 1.9 oz (72.63 kg)  01/08/15 157 lb 1.9 oz (71.269 kg)  05/23/14 161 lb (73.029 kg)         ASSESSMENT AND PLAN:  1.  History of paroxysmal SVT.  Continue metoprolol.  She has had no recent tachycardia. 2. Tobacco abuse.  Gradually reducing the number of  cigarettes she smokes.  Currently 3-5 cigarettes per day. 3. Insomnia.  He takes over-the-counter sleep aid.   Current medicines are reviewed at length with the patient today.  The patient does not have concerns regarding medicines.  The following changes have been made:  no change  Labs/ tests ordered today include:  No orders of the defined types were placed in this encounter.     Disposition:   Continue current medication.  She will follow-up in one year with Dr. Debara Pickett  Signed, Darlin Coco MD 05/28/2015 10:23 AM    [address], [place], [place]  [address] Phone: [phone]; Fax: [phone]

## 2015-05-28 NOTE — Patient Instructions (Signed)
Medication Instructions:  Your physician recommends that you continue on your current medications as directed. Please refer to the Current Medication list given to you today.  Labwork: none  Testing/Procedures: none  Follow-Up: Your physician wants you to follow-up in: 1 year ov with Dr Theresa Duty will receive a reminder letter in the mail two months in advance. If you don't receive a letter, please call our office to schedule the follow-up appointment.  If you need a refill on your cardiac medications before your next appointment, please call your pharmacy.

## 2015-07-31 DIAGNOSIS — Z961 Presence of intraocular lens: Secondary | ICD-10-CM | POA: Diagnosis not present

## 2015-07-31 DIAGNOSIS — H353131 Nonexudative age-related macular degeneration, bilateral, early dry stage: Secondary | ICD-10-CM | POA: Diagnosis not present

## 2015-07-31 DIAGNOSIS — H43813 Vitreous degeneration, bilateral: Secondary | ICD-10-CM | POA: Diagnosis not present

## 2015-12-10 DIAGNOSIS — L821 Other seborrheic keratosis: Secondary | ICD-10-CM | POA: Diagnosis not present

## 2015-12-10 DIAGNOSIS — L570 Actinic keratosis: Secondary | ICD-10-CM | POA: Diagnosis not present

## 2015-12-10 DIAGNOSIS — D225 Melanocytic nevi of trunk: Secondary | ICD-10-CM | POA: Diagnosis not present

## 2015-12-10 DIAGNOSIS — L814 Other melanin hyperpigmentation: Secondary | ICD-10-CM | POA: Diagnosis not present

## 2016-05-19 ENCOUNTER — Ambulatory Visit (INDEPENDENT_AMBULATORY_CARE_PROVIDER_SITE_OTHER): Payer: Medicare Other | Admitting: Cardiology

## 2016-05-19 ENCOUNTER — Encounter: Payer: Self-pay | Admitting: Cardiology

## 2016-05-19 VITALS — BP 120/84 | HR 61 | Ht 66.0 in | Wt 164.0 lb

## 2016-05-19 DIAGNOSIS — Z720 Tobacco use: Secondary | ICD-10-CM | POA: Diagnosis not present

## 2016-05-19 DIAGNOSIS — R002 Palpitations: Secondary | ICD-10-CM | POA: Diagnosis not present

## 2016-05-19 DIAGNOSIS — I471 Supraventricular tachycardia: Secondary | ICD-10-CM

## 2016-05-19 MED ORDER — METOPROLOL SUCCINATE ER 100 MG PO TB24: ORAL_TABLET | ORAL | 3 refills | Status: DC

## 2016-05-19 NOTE — Progress Notes (Signed)
PCP: Tawanna Solo, MD  Clinic Note: Chief Complaint  Patient presents with  . Follow-up    New former [facility] patient.h/o SVT    HPI: Regina James is a 83 y.o. female with a PMH below who presents today for Establishing new cardiologist after being on long-term patient of Dr. Mare Ferrari follow-up for PSVT. History of tobacco abuse. No history of ischemic heart disease. Normal EF by echo. Some diastolic dysfunction and mild aortic sclerosis.  KAMEA ALLINSON was last seen in February 2017 by Dr. Dennison Bulla had not noted any recurrence of SVT. Was doing pretty well. Was still cutting back to now 3-5 cigarettes a day. No chest pain or dyspnea.  Recent Hospitalizations: None  Studies Reviewed: No recent studies.  Interval History: Keron presents today for annual f/u doing quite well without any cardiac complaints.   She has not had any further issues with rapid irregular heartbeats since starting the 50th Toprol. Every now and then she will feel a quick butterfly-like sensation in in her chest, but nothing more than a couple seconds.  Cardiac review of symptoms: No chest pain or shortness of breath with rest or exertion.  No PND, orthopnea or edema.  No palpitations, lightheadedness, dizziness, weakness or syncope/near syncope. No TIA/amaurosis fugax symptoms. No claudication.  She still smokes ~3-4 Cigarrettes/day  ROS: A comprehensive was performed. Review of Systems  Constitutional: Negative for malaise/fatigue.  HENT: Negative for nosebleeds.   Respiratory: Negative for cough and shortness of breath.   Cardiovascular:       Per history of present illness  Gastrointestinal: Negative for blood in stool and melena.  Musculoskeletal: Negative for falls.  Neurological: Negative for dizziness.  Psychiatric/Behavioral: Negative for memory loss. The patient is not nervous/anxious and does not have insomnia.   All other systems reviewed and are negative.   Past  Medical History:  Diagnosis Date  . Atrial fibrillation ([place])   . Cancer ([place])    basal cell carcinoma  . Palpitations     Past Surgical History:  Procedure Laterality Date  . APPENDECTOMY    . CHOLECYSTECTOMY    . ROTATOR CUFF REPAIR    . US ECHOCARDIOGRAPHY  01/09/2009   EF 55-60%  . WRIST FRACTURE SURGERY      Current Meds  Medication Sig  . aspirin 81 MG tablet Take 81 mg by mouth daily.  . Doxylamine Succinate, Sleep, (SLEEP AID PO) Take 1 tablet by mouth at bedtime.   . metoprolol succinate (TOPROL-XL) 100 MG 24 hr tablet Take  0.5 tablet ( 50 mg total) once daily  . Multiple Vitamin (MULTIVITAMIN) tablet Take 1 tablet by mouth daily.    . [DISCONTINUED] metoprolol succinate (TOPROL-XL) 100 MG 24 hr tablet Take 50 mg by mouth daily. Take with or immediately following a meal.    Allergies  Allergen Reactions  . Morphine And Related Swelling  . Levofloxacin Anxiety    Double vision    Social History   Social History  . Marital status: Married    Spouse name: N/A  . Number of children: N/A  . Years of education: N/A   Social History Main Topics  . Smoking status: Current Every Day Smoker    Years: 45.00    Types: Cigarettes  . Smokeless tobacco: Never Used  . Alcohol use 4.2 oz/week    7 Glasses of wine per week  . Drug use: No  . Sexual activity: Not Asked   Other Topics Concern  .  None   Social History Narrative  . None    family history includes Cancer in her father; Diabetes in her father; Varicose Veins in her mother.  Wt Readings from Last 3 Encounters:  05/19/16 74.4 kg (164 lb)  05/28/15 72.6 kg (160 lb 1.9 oz)  01/08/15 71.3 kg (157 lb 1.9 oz)    PHYSICAL EXAM BP 120/84 Comment: Right arm.  Pulse 61   Ht 5\' 6"  (1.676 m)   Wt 74.4 kg (164 lb)   BMI 26.47 kg/m  General appearance: alert, cooperative, appears stated age, no distress and well-nourished, well-groomed Neck: no adenopathy, no carotid bruit and no JVD Lungs: clear to  auscultation bilaterally, normal percussion bilaterally and non-labored Heart: regular rate and rhythm, S1, S2 normal, no murmur, click, rub or gallop; nondisplaced PMI  Abdomen: soft, non-tender; bowel sounds normal; no masses,  no organomegaly; and no HJR  Extremities: extremities normal, atraumatic, no cyanosis, or edema  Pulses: 2+ and symmetric;  Skin: mobility and turgor normal, no evidence of bleeding or bruising and no lesions noted Neurologic: Mental status: Alert, oriented, thought content appropriate   Adult ECG Report  Rate: 61 ;  Rhythm: normal sinus rhythm and Incomplete RBBB, LAA;  Otherwise normal axis, intervals, durations   Narrative Interpretation: stable EKG   Other studies Reviewed: Additional studies/ records that were reviewed today include:  Recent Labs:  n/a    ASSESSMENT / PLAN: Problem List Items Addressed This Visit    Palpitations    Also controlled with beta blocker. She probably had some PACs and PVCs.      Relevant Orders   EKG 12-Lead (Completed)   Paroxysmal SVT (supraventricular tachycardia) (HCC) - Primary (Chronic)    Well-controlled on current dose of beta blocker. His custom vagal maneuvers, but otherwise has been stable for several years now.      Relevant Medications   metoprolol succinate (TOPROL-XL) 100 MG 24 hr tablet   Tobacco abuse    She is down to 3-4 cigarettes a day, but no COPD symptoms. Not interested in quitting      Relevant Orders   EKG 12-Lead (Completed)      Current medicines are reviewed at length with the patient today. (+/- concerns) n/a The following changes have been made: n/a  Patient Instructions  No change with current medication     Your physician wants you to follow-up in 12 months with DR Pahola Dimmitt. You will receive a reminder letter in the mail two months in advance. If you don't receive a letter, please call our office to schedule the follow-up appointment.    If you need a refill on your  cardiac medications before your next appointment, please call your pharmacy.   Studies Ordered:   Orders Placed This Encounter  Procedures  . EKG 12-Lead      Glenetta Hew, M.D., M.S. Interventional Cardiologist   Pager # [phone] Phone # [phone] [address] [place], [place] [address]

## 2016-05-19 NOTE — Patient Instructions (Signed)
No change with current medication     Your physician wants you to follow-up in 12 months with DR HARDING. You will receive a reminder letter in the mail two months in advance. If you don't receive a letter, please call our office to schedule the follow-up appointment.    If you need a refill on your cardiac medications before your next appointment, please call your pharmacy.

## 2016-05-20 ENCOUNTER — Telehealth: Payer: Self-pay | Admitting: Cardiology

## 2016-05-20 NOTE — Telephone Encounter (Signed)
New Message     Pt called she wants all her follow up care with Dr Debara Pickett, he is the Dr that Dr Mare Ferrari referred her too.  Dr Ellyn Hack is a great doctor and very pleasant but she wants the doctor who Dr Mare Ferrari suggested for her. Is this ok?

## 2016-05-20 NOTE — Telephone Encounter (Signed)
Okay with me 

## 2016-05-20 NOTE — Telephone Encounter (Signed)
I'm happy to see her.  Dr. Debara Pickett

## 2016-05-21 ENCOUNTER — Encounter: Payer: Self-pay | Admitting: Cardiology

## 2016-05-21 DIAGNOSIS — I471 Supraventricular tachycardia: Secondary | ICD-10-CM | POA: Insufficient documentation

## 2016-05-21 NOTE — Assessment & Plan Note (Signed)
Also controlled with beta blocker. She probably had some PACs and PVCs.

## 2016-05-21 NOTE — Assessment & Plan Note (Signed)
Well-controlled on current dose of beta blocker. His custom vagal maneuvers, but otherwise has been stable for several years now.

## 2016-05-21 NOTE — Assessment & Plan Note (Signed)
She is down to 3-4 cigarettes a day, but no COPD symptoms. Not interested in quitting

## 2016-06-27 ENCOUNTER — Observation Stay (HOSPITAL_COMMUNITY): Payer: Medicare Other

## 2016-06-27 ENCOUNTER — Inpatient Hospital Stay (HOSPITAL_COMMUNITY)
Admission: EM | Admit: 2016-06-27 | Discharge: 2016-06-29 | DRG: 065 | Disposition: A | Discharge status: Home / Self Care | Payer: Medicare Other | Attending: Internal Medicine | Admitting: Internal Medicine

## 2016-06-27 ENCOUNTER — Encounter (HOSPITAL_COMMUNITY): Payer: Self-pay | Admitting: Emergency Medicine

## 2016-06-27 ENCOUNTER — Emergency Department (HOSPITAL_COMMUNITY): Payer: Medicare Other

## 2016-06-27 DIAGNOSIS — I639 Cerebral infarction, unspecified: Secondary | ICD-10-CM | POA: Diagnosis not present

## 2016-06-27 DIAGNOSIS — R002 Palpitations: Secondary | ICD-10-CM | POA: Diagnosis present

## 2016-06-27 DIAGNOSIS — R202 Paresthesia of skin: Secondary | ICD-10-CM

## 2016-06-27 DIAGNOSIS — E785 Hyperlipidemia, unspecified: Secondary | ICD-10-CM | POA: Diagnosis present

## 2016-06-27 DIAGNOSIS — J349 Unspecified disorder of nose and nasal sinuses: Secondary | ICD-10-CM | POA: Diagnosis not present

## 2016-06-27 DIAGNOSIS — I6340 Cerebral infarction due to embolism of unspecified cerebral artery: Secondary | ICD-10-CM | POA: Diagnosis not present

## 2016-06-27 DIAGNOSIS — R000 Tachycardia, unspecified: Secondary | ICD-10-CM | POA: Diagnosis not present

## 2016-06-27 DIAGNOSIS — R29898 Other symptoms and signs involving the musculoskeletal system: Secondary | ICD-10-CM | POA: Diagnosis not present

## 2016-06-27 DIAGNOSIS — I700 Atherosclerosis of aorta: Secondary | ICD-10-CM | POA: Diagnosis present

## 2016-06-27 DIAGNOSIS — I739 Peripheral vascular disease, unspecified: Secondary | ICD-10-CM | POA: Diagnosis not present

## 2016-06-27 DIAGNOSIS — J342 Deviated nasal septum: Secondary | ICD-10-CM | POA: Diagnosis not present

## 2016-06-27 DIAGNOSIS — I479 Paroxysmal tachycardia, unspecified: Secondary | ICD-10-CM | POA: Diagnosis not present

## 2016-06-27 DIAGNOSIS — R471 Dysarthria and anarthria: Secondary | ICD-10-CM

## 2016-06-27 DIAGNOSIS — I471 Supraventricular tachycardia, unspecified: Secondary | ICD-10-CM | POA: Diagnosis present

## 2016-06-27 DIAGNOSIS — I491 Atrial premature depolarization: Secondary | ICD-10-CM | POA: Diagnosis present

## 2016-06-27 DIAGNOSIS — I712 Thoracic aortic aneurysm, without rupture: Secondary | ICD-10-CM | POA: Diagnosis present

## 2016-06-27 DIAGNOSIS — Z881 Allergy status to other antibiotic agents status: Secondary | ICD-10-CM

## 2016-06-27 DIAGNOSIS — I4891 Unspecified atrial fibrillation: Secondary | ICD-10-CM | POA: Diagnosis not present

## 2016-06-27 DIAGNOSIS — Z720 Tobacco use: Secondary | ICD-10-CM | POA: Diagnosis present

## 2016-06-27 DIAGNOSIS — R4781 Slurred speech: Secondary | ICD-10-CM | POA: Diagnosis not present

## 2016-06-27 DIAGNOSIS — Z885 Allergy status to narcotic agent status: Secondary | ICD-10-CM

## 2016-06-27 DIAGNOSIS — Z79899 Other long term (current) drug therapy: Secondary | ICD-10-CM

## 2016-06-27 DIAGNOSIS — J3489 Other specified disorders of nose and nasal sinuses: Secondary | ICD-10-CM | POA: Diagnosis present

## 2016-06-27 DIAGNOSIS — Z7289 Other problems related to lifestyle: Secondary | ICD-10-CM

## 2016-06-27 DIAGNOSIS — Z7982 Long term (current) use of aspirin: Secondary | ICD-10-CM

## 2016-06-27 DIAGNOSIS — R200 Anesthesia of skin: Secondary | ICD-10-CM | POA: Diagnosis present

## 2016-06-27 DIAGNOSIS — F17210 Nicotine dependence, cigarettes, uncomplicated: Secondary | ICD-10-CM | POA: Diagnosis present

## 2016-06-27 DIAGNOSIS — I10 Essential (primary) hypertension: Secondary | ICD-10-CM | POA: Diagnosis present

## 2016-06-27 DIAGNOSIS — R69 Illness, unspecified: Secondary | ICD-10-CM | POA: Diagnosis not present

## 2016-06-27 DIAGNOSIS — R531 Weakness: Secondary | ICD-10-CM | POA: Diagnosis not present

## 2016-06-27 DIAGNOSIS — I493 Ventricular premature depolarization: Secondary | ICD-10-CM | POA: Diagnosis present

## 2016-06-27 DIAGNOSIS — I63233 Cerebral infarction due to unspecified occlusion or stenosis of bilateral carotid arteries: Secondary | ICD-10-CM | POA: Diagnosis not present

## 2016-06-27 HISTORY — DX: Personal history of other diseases of the circulatory system: Z86.79

## 2016-06-27 LAB — BASIC METABOLIC PANEL
Anion gap: 11 (ref 5–15)
BUN: 11 mg/dL (ref 6–20)
CO2: 23 mmol/L (ref 22–32)
Calcium: 9.1 mg/dL (ref 8.9–10.3)
Chloride: 108 mmol/L (ref 101–111)
Creatinine, Ser: 0.87 mg/dL (ref 0.44–1.00)
GFR calc Af Amer: >60 mL/min (ref >60)
GFR calc non Af Amer: >60 mL/min (ref >60)
Glucose, Bld: 96 mg/dL (ref 65–99)
Potassium: 4.3 mmol/L (ref 3.5–5.1)
Sodium: 142 mmol/L (ref 135–145)

## 2016-06-27 LAB — CBC WITH DIFFERENTIAL/PLATELET
Basophils Absolute: 0.1 10*3/uL (ref 0.0–0.1)
Basophils Relative: 1 %
Eosinophils Absolute: 0.2 10*3/uL (ref 0.0–0.7)
Eosinophils Relative: 2 %
HCT: 45.3 % (ref 36.0–46.0)
Hemoglobin: 15.3 g/dL — ABNORMAL HIGH (ref 12.0–15.0)
Lymphocytes Relative: 28 %
Lymphs Abs: 2.4 10*3/uL (ref 0.7–4.0)
MCH: 32.6 pg (ref 26.0–34.0)
MCHC: 33.8 g/dL (ref 30.0–36.0)
MCV: 96.6 fL (ref 78.0–100.0)
Monocytes Absolute: 0.6 10*3/uL (ref 0.1–1.0)
Monocytes Relative: 7 %
Neutro Abs: 5.3 10*3/uL (ref 1.7–7.7)
Neutrophils Relative %: 62 %
Platelets: 286 10*3/uL (ref 150–400)
RBC: 4.69 MIL/uL (ref 3.87–5.11)
RDW: 14.9 % (ref 11.5–15.5)
WBC: 8.5 10*3/uL (ref 4.0–10.5)

## 2016-06-27 LAB — I-STAT TROPONIN, ED: Troponin i, poc: 0 ng/mL (ref 0.00–0.08)

## 2016-06-27 LAB — APTT: aPTT: 40 seconds — ABNORMAL HIGH (ref 24–36)

## 2016-06-27 LAB — PROTIME-INR
INR: 1.04
Prothrombin Time: 13.6 seconds (ref 11.4–15.2)

## 2016-06-27 MED ORDER — ENOXAPARIN SODIUM 40 MG/0.4ML ~~LOC~~ SOLN
40.0000 mg | SUBCUTANEOUS | Status: DC
  Administered 2016-06-27 – 2016-06-28 (×2): 40 mg via SUBCUTANEOUS
  Filled 2016-06-27 (×2): qty 0.4

## 2016-06-27 MED ORDER — IOPAMIDOL (ISOVUE-370) INJECTION 76%
INTRAVENOUS | Status: AC
  Administered 2016-06-27: 50 mL
  Filled 2016-06-27: qty 50

## 2016-06-27 MED ORDER — NICOTINE 7 MG/24HR TD PT24
7.0000 mg | MEDICATED_PATCH | Freq: Every day | TRANSDERMAL | Status: DC
  Administered 2016-06-28 – 2016-06-29 (×2): 7 mg via TRANSDERMAL
  Filled 2016-06-27 (×2): qty 1

## 2016-06-27 MED ORDER — ASPIRIN EC 81 MG PO TBEC: 81.0000 mg | DELAYED_RELEASE_TABLET | Freq: Every day | ORAL | Status: DC

## 2016-06-27 MED ORDER — SODIUM CHLORIDE 0.9 % IV SOLN
INTRAVENOUS | Status: DC
  Administered 2016-06-27 – 2016-06-28 (×2): via INTRAVENOUS

## 2016-06-27 MED ORDER — METOPROLOL SUCCINATE ER 25 MG PO TB24
50.0000 mg | ORAL_TABLET | Freq: Every day | ORAL | Status: DC
  Administered 2016-06-29: 50 mg via ORAL
  Filled 2016-06-27 (×2): qty 2

## 2016-06-27 MED ORDER — LORAZEPAM 2 MG/ML IJ SOLN
1.0000 mg | Freq: Once | INTRAMUSCULAR | Status: AC
  Administered 2016-06-27: 1 mg via INTRAVENOUS
  Filled 2016-06-27: qty 1

## 2016-06-27 MED ORDER — ACETAMINOPHEN 650 MG RE SUPP: 650.0000 mg | Freq: Four times a day (QID) | RECTAL | Status: DC | PRN

## 2016-06-27 MED ORDER — SENNOSIDES-DOCUSATE SODIUM 8.6-50 MG PO TABS: 1.0000 | ORAL_TABLET | Freq: Every evening | ORAL | Status: DC | PRN

## 2016-06-27 MED ORDER — CLOPIDOGREL BISULFATE 75 MG PO TABS
75.0000 mg | ORAL_TABLET | Freq: Every day | ORAL | Status: DC
  Administered 2016-06-29: 75 mg via ORAL
  Filled 2016-06-27 (×2): qty 1

## 2016-06-27 MED ORDER — ACETAMINOPHEN 160 MG/5ML PO SOLN: 650.0000 mg | Freq: Four times a day (QID) | ORAL | Status: DC | PRN

## 2016-06-27 MED ORDER — STROKE: EARLY STAGES OF RECOVERY BOOK
Freq: Once | Status: AC
  Administered 2016-06-27: 15:00:00

## 2016-06-27 MED ORDER — FLUTICASONE PROPIONATE 50 MCG/ACT NA SUSP
2.0000 | Freq: Every day | NASAL | Status: DC
  Administered 2016-06-29: 2 via NASAL
  Filled 2016-06-27: qty 16

## 2016-06-27 MED ORDER — ACETAMINOPHEN 325 MG PO TABS: 650.0000 mg | ORAL_TABLET | Freq: Four times a day (QID) | ORAL | Status: DC | PRN

## 2016-06-27 NOTE — Evaluation (Signed)
Speech Language Pathology Evaluation Patient Details Name: Regina James MRN: [id number] DOB: December 31, 1933 Today's Date: 06/27/2016 Time: 0865-7846 SLP Time Calculation (min) (ACUTE ONLY): 18 min  Problem List:  Patient Active Problem List   Diagnosis Date Noted  . Right arm weakness 06/27/2016  . Right arm numbness 06/27/2016  . Deviated septum 06/27/2016  . Disorder of ethmoidal sinus 06/27/2016  . Paroxysmal SVT (supraventricular tachycardia) ([place]) 05/21/2016  . Angiopathy, peripheral ([place]) 01/07/2015  . Cloudy posterior capsule 06/01/2013  . Pseudoaphakia 05/02/2013  . Malaise and fatigue 02/16/2013  . Cataract, nuclear 01/28/2012  . Palpitations 02/17/2011  . Claudication ([place]) 02/17/2011  . Tobacco abuse 02/17/2011   Past Medical History:  Past Medical History:  Diagnosis Date  . Atrial fibrillation ([place])   . Cancer ([place])    basal cell carcinoma  . Palpitations    Past Surgical History:  Past Surgical History:  Procedure Laterality Date  . APPENDECTOMY    . CHOLECYSTECTOMY    . ROTATOR CUFF REPAIR    . US ECHOCARDIOGRAPHY  01/09/2009   EF 55-60%  . WRIST FRACTURE SURGERY     HPI:  Regina James a 83 y.o.femalewith medical history significant of SVT, cataracts, basal cell carcinoma. Presented with right arm weakness. MRI 06/27/16 showed widely scattered but small, mostly cortically based, acute infarcts in the posterior Left MCA and the superior Left PCA territories.   Assessment / Plan / Recommendation Clinical Impression  Patient presents with cognitive-linguistic status which appears at baseline level of function. Daughter reports patient's speech was slurred this morning but agrees with pt that this problem has resolved. No apparent facial droop or weakness. Speech is 100% intelligible to this trained listener. Verbal expression and auditory comprehension are within functional limits. Cognitive communication appears within functional limits for tasks  assessed. No further follow-up with SLP recommended at this time. SLP will s/o.     SLP Assessment  SLP Recommendation/Assessment: Patient does not need any further Speech Lanaguage Pathology Services SLP Visit Diagnosis: Dysarthria and anarthria (R47.1)    Follow Up Recommendations  None    Frequency and Duration           SLP Evaluation Cognition  Overall Cognitive Status: Within Functional Limits for tasks assessed Arousal/Alertness: Awake/alert Orientation Level: Oriented X4 Attention: Focused;Sustained;Selective Focused Attention: Appears intact Sustained Attention: Appears intact Selective Attention: Appears intact Memory: Appears intact Awareness: Appears intact Safety/Judgment: Appears intact       Comprehension  Auditory Comprehension Overall Auditory Comprehension: Appears within functional limits for tasks assessed Yes/No Questions: Within Functional Limits Commands: Within Functional Limits Conversation: Simple Visual Recognition/Discrimination Discrimination: Not tested Reading Comprehension Reading Status: Not tested    Expression Expression Primary Mode of Expression: Verbal Verbal Expression Overall Verbal Expression: Appears within functional limits for tasks assessed Initiation: No impairment Automatic Speech: Name;Social Response;Counting;Day of week Level of Generative/Spontaneous Verbalization: Conversation Repetition: No impairment Naming: No impairment Pragmatics: No impairment Written Expression Dominant Hand: Right Written Expression: Not tested   Oral / Motor  Oral Motor/Sensory Function Overall Oral Motor/Sensory Function: Within functional limits Motor Speech Overall Motor Speech: Appears within functional limits for tasks assessed Respiration: Within functional limits Phonation: Normal Resonance: Within functional limits Articulation: Within functional limitis Intelligibility: Intelligible Motor Planning: Witnin functional  limits Motor Speech Errors: Not applicable   Parkman, Wynne CF-SLP Speech-Language Pathologist [phone] Aliene Altes 06/27/2016, 4:13  PM

## 2016-06-27 NOTE — H&P (Signed)
History and Physical    Regina James FMB:[id number] DOB: 11/16/1933 DOA: 06/27/2016  PCP: Regina Solo, MD Patient coming from: Home  Chief Complaint: Right arm weakness and numbness  HPI: Regina James is a 83 y.o. female with medical history significant of SVT, cataracts, basal cell carcinoma. This morning around 4:15 AM, she reports waking up and feeling something beside her. She did not know what it was and got up to turn on the light. She realized at that time that it was her arm. She had numbness and weakness of her right arm which concerned her for a stroke. She took two full strength aspirin and, over a few minutes, her strength improved slightly but numbness continued. She went back to sleep, and woke up around 9:00 AM with persistent weakness and numbness of her right arm. Her daughter felt that she may have had some slurred speech at the time as well. No facial droop or lower extremity weakness.  ED Course: Vitals: Slightly bradycardic with normal respirations, normal BP and 100% O2 on room air. Labs: Unremarkable. Troponin negative. Imaging: CT head significant for no stroke but did show ethmoid sinus disease and deviated septum Medications/Course: None  Review of Systems: Review of Systems  Constitutional: Negative for chills, diaphoresis and fever.  HENT: Positive for congestion.   Eyes: Negative for blurred vision and double vision.  Respiratory: Positive for cough. Negative for wheezing.   Cardiovascular: Negative for palpitations.  Gastrointestinal: Negative for diarrhea and vomiting.  Genitourinary: Negative for dysuria.  Musculoskeletal: Negative for falls.  Neurological: Positive for sensory change, speech change and focal weakness. Negative for tremors, seizures, loss of consciousness and headaches.  All other systems reviewed and are negative.   Past Medical History:  Diagnosis Date  . Atrial fibrillation (Regina James)   . Cancer (Regina James)    basal cell carcinoma   . Palpitations     Past Surgical History:  Procedure Laterality Date  . APPENDECTOMY    . CHOLECYSTECTOMY    . ROTATOR CUFF REPAIR    . US ECHOCARDIOGRAPHY  01/09/2009   EF 55-60%  . WRIST FRACTURE SURGERY       reports that she has been smoking Cigarettes.  She has smoked for the past 45.00 years. She has never used smokeless tobacco. She reports that she drinks about 4.2 oz of alcohol per week . She reports that she does not use drugs.  Allergies  Allergen Reactions  . Morphine And Related Swelling  . Levofloxacin Anxiety    Double vision    Family History  Problem Relation Age of Onset  . Cancer Father   . Diabetes Father   . Varicose Veins Mother   . Cardiomyopathy Mother   . Kidney cancer Brother   . Arrhythmia Brother     Prior to Admission medications   Medication Sig Start Date End Date Taking? Authorizing Provider  aspirin 81 MG tablet Take 81 mg by mouth daily.    Historical Provider, MD  Doxylamine Succinate, Sleep, (SLEEP AID PO) Take 1 tablet by mouth at bedtime.     Historical Provider, MD  metoprolol succinate (TOPROL-XL) 100 MG 24 hr tablet Take  0.5 tablet ( 50 mg total) once daily 05/19/16   Leonie Man, MD  Multiple Vitamin (MULTIVITAMIN) tablet Take 1 tablet by mouth daily.      Historical Provider, MD    Physical Exam: Vitals:   06/27/16 1102 06/27/16 1104 06/27/16 1115 06/27/16 1159  BP: 140/75  (!) 154/110  130/83  Pulse: 64  (!) 56 (!) 54  Resp: 16  20 20   Temp:  97.8 F (36.6 C)    SpO2: 99%  99% 99%  Weight:      Height:         Constitutional: NAD, calm, comfortable Eyes: PERRL, lids and conjunctivae normal ENMT: Mucous membranes are moist. Posterior pharynx clear of any exudate or lesions. Neck: normal, supple, no masses, no thyromegaly Respiratory: clear to auscultation bilaterally, no wheezing, no crackles. Normal respiratory effort. No accessory muscle use.  Cardiovascular: Regular rate and rhythm, no murmurs / rubs /  gallops. No extremity edema. 2+ pedal pulses. No carotid bruits.  Abdomen: no tenderness, no masses palpated. No hepatosplenomegaly. Bowel sounds positive.  Musculoskeletal: no clubbing / cyanosis. No joint deformity upper and lower extremities. Good ROM, no contractures. Normal muscle tone.  Skin: Multiple seborrheic dermatoses Neurologic: CN 2-12 grossly intact. Sensation intact, DTR symmetrical and diminished. Strength 4/5 of RUE and 5/5 of LUE and bilateral LE. Normal sensation throughout Psychiatric: Normal judgment and insight. Alert and oriented x 3. Normal mood.    Labs on Admission: I have personally reviewed following labs and imaging studies  CBC:  Recent Labs Lab 06/27/16 1059  WBC 8.5  NEUTROABS 5.3  HGB 15.3*  HCT 45.3  MCV 96.6  PLT 220   Basic Metabolic Panel:  Recent Labs Lab 06/27/16 1059  NA 142  K 4.3  CL 108  CO2 23  GLUCOSE 96  BUN 11  CREATININE 0.87  CALCIUM 9.1   GFR: Estimated Creatinine Clearance: 47.5 mL/min (by C-G formula based on SCr of 0.87 mg/dL).  Coagulation Profile:  Recent Labs Lab 06/27/16 1059  INR 1.04   Urine analysis:    Component Value Date/Time   COLORURINE YELLOW 03/21/2007 1822   APPEARANCEUR CLEAR 03/21/2007 1822   LABSPEC 1.022 03/21/2007 1822   PHURINE 6.5 03/21/2007 1822   GLUCOSEU NEGATIVE 03/21/2007 1822   HGBUR NEGATIVE 03/21/2007 1822   BILIRUBINUR NEGATIVE 03/21/2007 1822   KETONESUR NEGATIVE 03/21/2007 1822   PROTEINUR NEGATIVE 03/21/2007 1822   UROBILINOGEN 1.0 03/21/2007 1822   NITRITE NEGATIVE 03/21/2007 1822   LEUKOCYTESUR  03/21/2007 1822    NEGATIVE MICROSCOPIC NOT DONE ON URINES WITH NEGATIVE PROTEIN, BLOOD, LEUKOCYTES, NITRITE, OR GLUCOSE <1000 mg/dL.    Radiological Exams on Admission: Ct Head Wo Contrast  Result Date: 06/27/2016 CLINICAL DATA:  Right upper extremity weakness EXAM: CT HEAD WITHOUT CONTRAST TECHNIQUE: Contiguous axial images were obtained from the base of the skull  through the vertex without intravenous contrast. COMPARISON:  March 23, 2007 FINDINGS: Brain: The ventricles are normal in size and configuration. There is no intracranial mass, hemorrhage, extra-axial fluid collection, or midline shift. There is slight small vessel disease in the centra semiovale bilaterally. Elsewhere gray-white compartments appear normal. No evident acute infarct. Vascular: No hyperdense vessel. There is calcification in each carotid siphon region. Skull: The bony calvarium appears intact. Sinuses/Orbits: There is mucosal thickening in multiple ethmoid air cells bilaterally. Opacification is greatest in several anterior right-sided ethmoid air cells. Visualized paranasal sinuses otherwise are clear. There is leftward deviation of the nasal septum. Orbits appear symmetric bilaterally. Other: Mastoid air cells are clear. IMPRESSION: Slight periventricular small vessel disease. No intracranial mass, hemorrhage, or extra-axial fluid collection. No acute infarct evident. Foci of vascular calcification noted. Areas of ethmoid sinus disease noted, more severe on the right than on the left. There is leftward deviation of the nasal septum. Electronically Signed  By: Lowella Grip III M.D.   On: 06/27/2016 10:56    EKG: Independently reviewed. Sinus bradycardia. ?Unchanged from previous EKG  Assessment/Plan Principal Problem:   Right arm weakness Active Problems:   Tobacco abuse   Paroxysmal SVT (supraventricular tachycardia) (H[facility])   Right arm numbness   Deviated septum   Disorder of ethmoidal sinus  Right arm weakness TIA vs stroke. Symptoms are improving. -MRI pending -neuro recommendations pending for further imaging/studies -continue aspirin 81 mg for now. Await neurology recommendations with regard to changing antiplatelet therapy -bilateral carotid dopplers -Echocardiogram  Right arm numbness Likely secondary to TIA vs stroke as above. Symptoms have actually  resolved.  PSVT Initially followed by Dr. Mare Ferrari but last saw Dr. Ellyn Hack. Rate slightly bradycardic but asymptomatic -continue metoprolol  Ethmoid sinus disease -intranasal steroids  Deviated septum Seen incidentally on CT scan. Left deviation -Outpatient management -intranasal steroids  Tobacco abuse Counseled. Patient has zero desire to quit smoking -nicotine patch  DVT prophylaxis: Lovenox Code Status: Full code Family Communication: Son, daughter and grandson at bedside Disposition Plan: Discharge home when stroke workup complete Consults called: Neurology by EDP, Dr. Shon Hale Admission status: Observation, telemetry. Will meet inpatient if stroke confirmed on MRI   Cordelia Poche, MD [facility] Pager [phone]  If 7PM-7AM, please contact night-coverage [email or web address] Password [facility]  06/27/2016, 12:14 PM

## 2016-06-27 NOTE — Consult Note (Addendum)
Neurology Consult Note  Reason for Consultation: Right arm weakness  Requesting provider: Theotis Burrow, MD  CC: I can't control my R arm  HPI: This is an 83 year old right-handed woman who presented to the emergency department for evaluation of right arm weakness. History is obtained directly from the patient was an excellent historian.  The patient reports that she woke up this morning at 4:15 and realized that she was having difficulty moving her right arm. In fact, she initially did not recognize it as her own arm and thought something else was in bed with her. Once she realized that this was indeed her arm, she noticed that she was having difficulty moving it. She also reports that she has some numbness. She thinks it was a sensation of numbness that woke her from sleep but is not certain. The last time she was known to be normal was when she went to bed last night. In the ED, she was noted to have ongoing weakness in the right arm. She has been admitted for further evaluation of presumed stroke. MRI scan of the brain has since been obtained and indeed shows numerous scattered areas of acute ischemia involving the left frontal and parietal lobes. Neurology consultation is now requested for further evaluation.  The patient reports that her right arm symptoms have improved somewhat over the course of the day. However, she continues to have significant difficulty using her right hand and has poor dexterity on that side. She denies any other symptoms, specifically reporting no slurred speech, no difficulty expressing herself, no difficulty comprehending, no vision changes, no trouble swallowing, and no left-sided symptoms. She initially did not call 911 but did take two full dose aspirins. She spoke to her daughter who says that she also seemed have a little bit of slurred speech. Her daughter encouraged her to call 911 and to come to the hospital for further evaluation.  The patient denies any prior  episodes of focal neurologic deficits. She's never been told she has had TIA or stroke. She had a reported history of atrial fibrillation many years ago. More recently, according to cardiology notes, she carries a diagnosis of supraventricular tachycardia without mention of atrial fibrillation. She takes aspirin 81 mg daily.  Last known well: prior to going to bed the night of 06/26/16 NHISS score: 0 tPA given?: No, outside of window   PMH:  Past Medical History:  Diagnosis Date  . Atrial fibrillation ([place])   . Cancer ([place])    basal cell carcinoma  . Palpitations     PSH:  Past Surgical History:  Procedure Laterality Date  . APPENDECTOMY    . CHOLECYSTECTOMY    . ROTATOR CUFF REPAIR    . US ECHOCARDIOGRAPHY  01/09/2009   EF 55-60%  . WRIST FRACTURE SURGERY      Family history: Family History  Problem Relation Age of Onset  . Cancer Father   . Diabetes Father   . Varicose Veins Mother   . Cardiomyopathy Mother   . Kidney cancer Brother   . Arrhythmia Brother     Social history:  Social History   Social History  . Marital status: Married    Spouse name: N/A  . Number of children: N/A  . Years of education: N/A   Occupational History  . Not on file.   Social History Main Topics  . Smoking status: Current Every Day Smoker    Packs/day: 0.25    Years: 45.00    Types: Cigarettes  .  Smokeless tobacco: Never Used  . Alcohol use 4.2 oz/week    7 Glasses of wine per week  . Drug use: No  . Sexual activity: Not on file   Other Topics Concern  . Not on file   Social History Narrative  . No narrative on file    Current outpatient meds: Medications reviewed and reconciled Current Meds  Medication Sig  . aspirin 81 MG tablet Take 81 mg by mouth daily.  . Doxylamine Succinate, Sleep, (SLEEP AID PO) Take 1 tablet by mouth at bedtime.   . metoprolol succinate (TOPROL-XL) 100 MG 24 hr tablet Take  0.5 tablet ( 50 mg total) once daily  . Multiple Vitamin  (MULTIVITAMIN) tablet Take 1 tablet by mouth daily.    Vladimir Faster Glycol-Propyl Glycol (SYSTANE OP) Place 1 drop into both eyes daily as needed. For dry eyes    Current inpatient meds: Medications reviewed and reconciled Current Facility-Administered Medications  Medication Dose Route Frequency Provider Last Rate Last Dose  .  stroke: mapping our early stages of recovery book   Does not apply Once Mariel Aloe, MD      . 0.9 %  sodium chloride infusion   Intravenous Continuous Mariel Aloe, MD      . acetaminophen (TYLENOL) tablet 650 mg  650 mg Oral Q6H PRN Mariel Aloe, MD       Or  . acetaminophen (TYLENOL) solution 650 mg  650 mg Per Tube Q6H PRN Mariel Aloe, MD       Or  . acetaminophen (TYLENOL) suppository 650 mg  650 mg Rectal Q6H PRN Mariel Aloe, MD      . Derrill Memo ON 06/28/2016] aspirin EC tablet 81 mg  81 mg Oral Daily Mariel Aloe, MD      . enoxaparin (LOVENOX) injection 40 mg  40 mg Subcutaneous Q24H Mariel Aloe, MD      . fluticasone (FLONASE) 50 MCG/ACT nasal spray 2 spray  2 spray Each Nare Daily Mariel Aloe, MD      . Derrill Memo ON 06/28/2016] metoprolol succinate (TOPROL-XL) 24 hr tablet 50 mg  50 mg Oral Daily Mariel Aloe, MD      . Derrill Memo ON 06/28/2016] nicotine (NICODERM CQ - dosed in mg/24 hr) patch 7 mg  7 mg Transdermal Daily Mariel Aloe, MD      . senna-docusate (Senokot-S) tablet 1 tablet  1 tablet Oral QHS PRN Mariel Aloe, MD        Allergies: Allergies  Allergen Reactions  . Morphine And Related Swelling  . Levofloxacin Anxiety    Double vision    ROS: As per HPI. A full 14-point review of systems was performed and is otherwise unremarkable.   PE:  BP 106/77 (BP Location: Right Arm)   Pulse 69   Temp 98.1 F (36.7 C) (Oral)   Resp 19   Ht 5\' 6"  (1.676 m)   Wt 70.3 kg (155 lb)   SpO2 100%   BMI 25.02 kg/m   General: WDWN Woman who appears younger than her stated age. She is in no acute distress. AAO x4. Speech clear, no  dysarthria. No aphasia. She has some occasional circumlocutions and rare word finding pauses. Follows commands briskly. Affect is bright with congruent mood. Comportment is normal.  HEENT: Normocephalic. Neck supple without LAD. MMM, OP clear. Dentition good. Sclerae anicteric. No conjunctival injection.  CV: Regular, no murmur. Carotid pulses full and symmetric, no bruits. Distal  pulses 2+ and symmetric.  Lungs: CTAB.  Abdomen: Soft, non-distended, non-tender. Bowel sounds present x4.  Extremities: No C/C/E. Neuro:  CN: Pupils are equal and round. They are symmetrically reactive from 3-->2 mm. EOMI without nystagmus. No reported diplopia. Facial sensation is intact to light touch. Face is symmetric at rest with normal strength and mobility. Hearing is intact to conversational voice. Palate elevates symmetrically and uvula is midline. Voice is normal in tone, pitch and quality. Bilateral SCM and trapezii are 5/5. Tongue is midline with normal bulk and mobility.  Motor: Normal bulk, tone, and strength with the exception of 4+/5 strength in the right finger extensors, right wrist extensors, and right triceps. No tremor or other abnormal movements. No drift.  Sensation: Intact to light touch and pinprick.  DTRs: 2+, symmetric. Toes downgoing bilaterally. No pathologic reflexes.  Coordination: Finger-to-nose and heel-to-shin are without dysmetria but she has mild motor dyscontrol in the right arm. Finger taps are very slow and inaccurate with the right hand with poor dexterity.   Labs:  Lab Results  Component Value Date   WBC 8.5 06/27/2016   HGB 15.3 (H) 06/27/2016   HCT 45.3 06/27/2016   PLT 286 06/27/2016   GLUCOSE 96 06/27/2016   NA 142 06/27/2016   K 4.3 06/27/2016   CL 108 06/27/2016   CREATININE 0.87 06/27/2016   BUN 11 06/27/2016   CO2 23 06/27/2016   TSH 3.663  03/21/2007   INR 1.04 06/27/2016   Troponin 0.00 PTT 40  Imaging:  I have personally and independently reviewed the  MRI scan of the brain without contrast from today. This shows numerous patchy areas of restricted diffusion involving the left frontal and parietal lobes. These are mainly cortical in location. These are consistent with acute embolic ischemic infarctions. There is a mild burden of scattered T2/FLAIR hyperintensities in the bihemispheric white matter that is suggestive of chronic small vessel ischemic disease. Age appropriate atrophy is noted. The intracranial vascular flow voids appear unremarkable with the exception of a dominant left vertebral artery.   Assessment and Plan:  1. Acute Ischemic Stroke: This is an acute stroke involving the distal left MCA territory. It is most likely embolic in etiology. Known risk factors for cerebrovascular disease in this patient include age and reported history of atrial fibrillation. Additional workup will be ordered to include carotid Dopplers, TTE, fasting lipids, and hemoglobin a1c. Further testing will be determined by results from these initial studies. She has a history of atrophic fibrillation documented in the chart but is presently being followed for superventricular tachycardia. Will need to monitor closely for evidence of A. fib and consider 30 day event monitor versus loop recorder for further assessment if no arrhythmias documented during the admission. Recommend antiplatelet therapy with Plavix for secondary stroke prevention since this event occurred on aspirin. Consider addition of statin with goal LDL less than 70. Ensure adequate glucose control. Tobacco cessation. Allow permissive hypertension in the acute phase, treating only SBP greater than 220 mmHg and/or DBP greater than 110 mmHg. Avoid fever and hyperglycemia as these can extend the infarct. Initiate rehab services. DVT prophylaxis as needed.   2. Right arm weakness: This is mild to moderate in character. This is severe, due to stroke. She also describes an element of disconnection from this limb  which can sometimes be seen with parietal lobe injuries. Occupational therapy, rehabilitation.  3. Dysarthria: She reportedly had some dysarthria initially but this has fully resolved at this point. This can be  observed. Appreciate speech therapy assistance.  This was discussed with the patient and her daughter. Education was provided on the diagnosis and expected evaluation and treatment. They are in agreement with the plan as noted. They were given the opportunity to ask any questions and these were addressed to their satisfaction.

## 2016-06-27 NOTE — ED Notes (Signed)
Attempted report 

## 2016-06-27 NOTE — ED Notes (Signed)
Pt returned to room from CT

## 2016-06-27 NOTE — ED Triage Notes (Signed)
Pt took aspirin at home.

## 2016-06-27 NOTE — ED Triage Notes (Signed)
Per [facility], woke up at 415am this morning, states her R arm felt clumsy. Pt ambulatory, no other neuro deficits. Passed stroke scale, pt able to move all extremities.

## 2016-06-27 NOTE — ED Provider Notes (Signed)
[facility] DEPT Provider Note   CSN: [id number] Arrival date & time: 06/27/16  0354     History   Chief Complaint Chief Complaint  Patient presents with  . Arm Pain    HPI Regina James is a 83 y.o. female with PMH/o Afib who presents with RUE numbness/weakness that began at 0415 this morning. Patient states that when she woke up this morning, her RUE "felt like it was asleep and just didn't feel like it was doing what it was supposed to." Throughout the morning, she noticed worsening of symptoms, especially noting that when she tried to write her name with a pen or look for something in her purse, it was harder to use her arm and didn't feel normal and "that it was harder to do normal activities." She denies any difficulty walking, speaking, facial droop. Patient has a history of Afib and takes Metoprolol 50 mg/day. She is not currently on any blood thinners. Patient is a current smoker and smokes 3-5 cigarettes a day. Patient denies any falls or trauma.   The history is provided by the patient.    Past Medical History:  Diagnosis Date  . Atrial fibrillation ([place])   . Cancer ([place])    basal cell carcinoma  . Palpitations     Patient Active Problem List   Diagnosis Date Noted  . Right arm weakness 06/27/2016  . Paroxysmal SVT (supraventricular tachycardia) ([place]) 05/21/2016  . Angiopathy, peripheral ([place]) 01/07/2015  . Cloudy posterior capsule 06/01/2013  . Pseudoaphakia 05/02/2013  . Malaise and fatigue 02/16/2013  . Cataract, nuclear 01/28/2012  . Palpitations 02/17/2011  . Claudication ([place]) 02/17/2011  . Tobacco abuse 02/17/2011    Past Surgical History:  Procedure Laterality Date  . APPENDECTOMY    . CHOLECYSTECTOMY    . ROTATOR CUFF REPAIR    . US ECHOCARDIOGRAPHY  01/09/2009   EF 55-60%  . WRIST FRACTURE SURGERY      OB History    No data available       Home Medications    Prior to Admission medications   Medication Sig Start Date End Date  Taking? Authorizing Provider  aspirin 81 MG tablet Take 81 mg by mouth daily.    Historical Provider, MD  Doxylamine Succinate, Sleep, (SLEEP AID PO) Take 1 tablet by mouth at bedtime.     Historical Provider, MD  metoprolol succinate (TOPROL-XL) 100 MG 24 hr tablet Take  0.5 tablet ( 50 mg total) once daily 05/19/16   Leonie Man, MD  Multiple Vitamin (MULTIVITAMIN) tablet Take 1 tablet by mouth daily.      Historical Provider, MD    Family History Family History  Problem Relation Age of Onset  . Cancer Father   . Diabetes Father   . Varicose Veins Mother   . Cardiomyopathy Mother   . Kidney cancer Brother   . Arrhythmia Brother     Social History Social History  Substance Use Topics  . Smoking status: Current Every Day Smoker    Packs/day: 0.25    Years: 45.00    Types: Cigarettes  . Smokeless tobacco: Never Used  . Alcohol use 4.2 oz/week    7 Glasses of wine per week     Allergies   Morphine and related and Levofloxacin   Review of Systems Review of Systems  Constitutional: Negative for chills and fever.  HENT: Negative for congestion.   Eyes: Negative for visual disturbance.  Respiratory: Negative for cough and shortness of breath.  Cardiovascular: Negative for chest pain.  Gastrointestinal: Negative for abdominal pain, blood in stool, diarrhea, nausea and vomiting.  Genitourinary: Negative for dysuria and hematuria.  Musculoskeletal: Negative for back pain and neck pain.  Skin: Negative for rash.  Neurological: Positive for weakness and numbness. Negative for dizziness, speech difficulty and headaches.  Psychiatric/Behavioral: Negative for confusion.  All other systems reviewed and are negative.    Physical Exam Updated Vital Signs BP 130/83   Pulse (!) 54   Temp 97.8 F (36.6 C)   Resp 20   Ht 5\' 6"  (1.676 m)   Wt 70.3 kg   SpO2 99%   BMI 25.02 kg/m   Physical Exam  Constitutional: She is oriented to person, place, and time. She appears  well-developed and well-nourished.  HENT:  Head: Normocephalic and atraumatic.  Mouth/Throat: Oropharynx is clear and moist and mucous membranes are normal.  Eyes: Conjunctivae, EOM and lids are normal. Pupils are equal, round, and reactive to light.  Neck: Full passive range of motion without pain.  Cardiovascular: Normal rate, regular rhythm, normal heart sounds and normal pulses.  Exam reveals no gallop and no friction rub.   No murmur heard. Pulmonary/Chest: Effort normal and breath sounds normal.  Abdominal: Soft. Normal appearance. There is no tenderness.  Musculoskeletal: Normal range of motion.  No tenderness to palpation of BUE. No deformity noted.   Neurological: She is alert and oriented to person, place, and time.  Cranial nerves III-XII intact Follows commands, Moves all extremities  5/5 strength BLE 5/5 strength LUE, slightly diminished strength of RUE Decreased hand grip on RUE. Normal hand grip strength of LUE No slurred speech. No facial droop.  +Pronator drift on right side  +Dysmetria  +Diminished rapid alternating movements on right side; Normal on left side  Subjective decreased sensation on RUE  Skin: Skin is warm and dry. Capillary refill takes less than 2 seconds.  Psychiatric: She has a normal mood and affect. Her speech is normal and behavior is normal.  Nursing note and vitals reviewed.    ED Treatments / Results  Labs (all labs ordered are listed, but only abnormal results are displayed) Labs Reviewed  CBC WITH DIFFERENTIAL/PLATELET - Abnormal; Notable for the following:       Result Value   Hemoglobin 15.3 (*)    All other components within normal limits  APTT - Abnormal; Notable for the following:    aPTT 40 (*)    All other components within normal limits  BASIC METABOLIC PANEL  PROTIME-INR  URINALYSIS, ROUTINE W REFLEX MICROSCOPIC  I-STAT TROPOININ, ED    EKG  EKG Interpretation  Date/Time:  Sunday June 27 2016 11:39:46  EDT Ventricular Rate:  57 PR Interval:  178 QRS Duration: 90 QT Interval:  450 QTC Calculation: 438 R Axis:   -12 Text Interpretation:  Sinus bradycardia Possible Anterior infarct , age undetermined Abnormal ECG No significant change since last tracing Confirmed by LITTLE MD, RACHEL [phone]) on 06/27/2016 11:46:51 AM Also confirmed by LITTLE MD, RACHEL [phone]), editor Drema Pry [phone])  on 06/27/2016 11:51:02 AM       Radiology Ct Head Wo Contrast  Result Date: 06/27/2016 CLINICAL DATA:  Right upper extremity weakness EXAM: CT HEAD WITHOUT CONTRAST TECHNIQUE: Contiguous axial images were obtained from the base of the skull through the vertex without intravenous contrast. COMPARISON:  March 23, 2007 FINDINGS: Brain: The ventricles are normal in size and configuration. There is no intracranial mass, hemorrhage, extra-axial fluid collection, or midline shift.  There is slight small vessel disease in the centra semiovale bilaterally. Elsewhere gray-white compartments appear normal. No evident acute infarct. Vascular: No hyperdense vessel. There is calcification in each carotid siphon region. Skull: The bony calvarium appears intact. Sinuses/Orbits: There is mucosal thickening in multiple ethmoid air cells bilaterally. Opacification is greatest in several anterior right-sided ethmoid air cells. Visualized paranasal sinuses otherwise are clear. There is leftward deviation of the nasal septum. Orbits appear symmetric bilaterally. Other: Mastoid air cells are clear. IMPRESSION: Slight periventricular small vessel disease. No intracranial mass, hemorrhage, or extra-axial fluid collection. No acute infarct evident. Foci of vascular calcification noted. Areas of ethmoid sinus disease noted, more severe on the right than on the left. There is leftward deviation of the nasal septum. Electronically Signed   By: Lowella Grip III M.D.   On: 06/27/2016 10:56    Procedures Procedures (including  critical care time)  Medications Ordered in ED Medications - No data to display   Initial Impression / Assessment and Plan / ED Course  I have reviewed the triage vital signs and the nursing notes.  Pertinent labs & imaging results that were available during my care of the patient were reviewed by me and considered in my medical decision making (see chart for details).     83 yo F with PMH/o Afib who presents today with RUE numbness/weakness that began this morning at 0415. No other neuro symptoms per history. Patient has +pronator drift, dysmetria, and abnormal rapid alternating movements on physical exam. History/physical concerning for acute CVA. Less likely TIA given that symptoms have persisted. Onset of symptoms 0415 this AM places patient out of the advised tPA window. Will not consider tPA at this time. Will check CT Head to eval for any mass or hemorrhage. Will also check labs, including CBC, BMP, EKG, UA, Trop, Coags.   Labs and imaging reviewed with patient and family. Discussed the need for admission for further stroke workup given high suspicion of acute CVA. Patient and family expresses understanding and agreement to plan.   11:42 AM: Neuro consult placed.   11:46 AM: Dr. Rex Kras discussed with on Dr. Shon Hale (neuro). Will see the patient in consultation. Agrees with plan to admit.   11:59 AM: Dr. Rex Kras discussed [facility]. Plan to admit.   Final Clinical Impressions(s) / ED Diagnoses   Final diagnoses:  Atrial fibrillation, unspecified type [facility])    New Prescriptions Current Discharge Medication List       [place], [place] 06/27/16 [place], MD 06/27/16 1739

## 2016-06-27 NOTE — Progress Notes (Signed)
Patient arrived to unit. A&O X4.denies pain. States that she is here because her right "did not do what  Its supposed to do ". She states that it "felt like another person's hand" Patient denies any need for now. Will continue to monitor.

## 2016-06-27 NOTE — ED Notes (Signed)
Pt transported to MRI, MRI notified to contact this nurse to send tech to take directly to floor.

## 2016-06-27 NOTE — ED Notes (Signed)
Pt transported to CT ?

## 2016-06-27 NOTE — Progress Notes (Signed)
Patient states that she smokes. When asked if she considered smoke cessation, patient states "No, I am not interested". Patient is educated about smoking and how its a risk factor. Will continue to monitor.

## 2016-06-28 ENCOUNTER — Observation Stay (HOSPITAL_BASED_OUTPATIENT_CLINIC_OR_DEPARTMENT_OTHER): Payer: Medicare Other

## 2016-06-28 ENCOUNTER — Observation Stay (HOSPITAL_COMMUNITY): Payer: Medicare Other

## 2016-06-28 ENCOUNTER — Encounter (HOSPITAL_COMMUNITY): Payer: Self-pay | Admitting: Cardiology

## 2016-06-28 DIAGNOSIS — J3489 Other specified disorders of nose and nasal sinuses: Secondary | ICD-10-CM | POA: Diagnosis present

## 2016-06-28 DIAGNOSIS — I4891 Unspecified atrial fibrillation: Secondary | ICD-10-CM | POA: Diagnosis not present

## 2016-06-28 DIAGNOSIS — I10 Essential (primary) hypertension: Secondary | ICD-10-CM | POA: Diagnosis present

## 2016-06-28 DIAGNOSIS — Z7982 Long term (current) use of aspirin: Secondary | ICD-10-CM | POA: Diagnosis not present

## 2016-06-28 DIAGNOSIS — I712 Thoracic aortic aneurysm, without rupture: Secondary | ICD-10-CM | POA: Diagnosis not present

## 2016-06-28 DIAGNOSIS — F17210 Nicotine dependence, cigarettes, uncomplicated: Secondary | ICD-10-CM | POA: Diagnosis present

## 2016-06-28 DIAGNOSIS — Z885 Allergy status to narcotic agent status: Secondary | ICD-10-CM | POA: Diagnosis not present

## 2016-06-28 DIAGNOSIS — I493 Ventricular premature depolarization: Secondary | ICD-10-CM | POA: Diagnosis present

## 2016-06-28 DIAGNOSIS — I6522 Occlusion and stenosis of left carotid artery: Secondary | ICD-10-CM

## 2016-06-28 DIAGNOSIS — Z79899 Other long term (current) drug therapy: Secondary | ICD-10-CM | POA: Diagnosis not present

## 2016-06-28 DIAGNOSIS — E785 Hyperlipidemia, unspecified: Secondary | ICD-10-CM | POA: Diagnosis present

## 2016-06-28 DIAGNOSIS — I771 Stricture of artery: Secondary | ICD-10-CM

## 2016-06-28 DIAGNOSIS — R29898 Other symptoms and signs involving the musculoskeletal system: Secondary | ICD-10-CM | POA: Diagnosis not present

## 2016-06-28 DIAGNOSIS — Z720 Tobacco use: Secondary | ICD-10-CM | POA: Diagnosis not present

## 2016-06-28 DIAGNOSIS — I700 Atherosclerosis of aorta: Secondary | ICD-10-CM | POA: Diagnosis present

## 2016-06-28 DIAGNOSIS — I739 Peripheral vascular disease, unspecified: Secondary | ICD-10-CM | POA: Diagnosis present

## 2016-06-28 DIAGNOSIS — I639 Cerebral infarction, unspecified: Secondary | ICD-10-CM

## 2016-06-28 DIAGNOSIS — J342 Deviated nasal septum: Secondary | ICD-10-CM | POA: Diagnosis present

## 2016-06-28 DIAGNOSIS — Z881 Allergy status to other antibiotic agents status: Secondary | ICD-10-CM | POA: Diagnosis not present

## 2016-06-28 DIAGNOSIS — G459 Transient cerebral ischemic attack, unspecified: Secondary | ICD-10-CM

## 2016-06-28 DIAGNOSIS — I491 Atrial premature depolarization: Secondary | ICD-10-CM | POA: Diagnosis present

## 2016-06-28 DIAGNOSIS — R002 Palpitations: Secondary | ICD-10-CM | POA: Diagnosis present

## 2016-06-28 DIAGNOSIS — Z7289 Other problems related to lifestyle: Secondary | ICD-10-CM | POA: Diagnosis not present

## 2016-06-28 DIAGNOSIS — I6340 Cerebral infarction due to embolism of unspecified cerebral artery: Secondary | ICD-10-CM | POA: Diagnosis present

## 2016-06-28 DIAGNOSIS — R471 Dysarthria and anarthria: Secondary | ICD-10-CM | POA: Diagnosis present

## 2016-06-28 DIAGNOSIS — I471 Supraventricular tachycardia: Secondary | ICD-10-CM | POA: Diagnosis not present

## 2016-06-28 LAB — ECHOCARDIOGRAM COMPLETE
Height: 66 in
Weight: 2480 oz

## 2016-06-28 LAB — LIPID PANEL
Cholesterol: 167 mg/dL (ref 0–200)
HDL: 31 mg/dL — ABNORMAL LOW (ref >40)
LDL Cholesterol: 95 mg/dL (ref 0–99)
Total CHOL/HDL Ratio: 5.4 RATIO
Triglycerides: 206 mg/dL — ABNORMAL HIGH (ref <150)
VLDL: 41 mg/dL — ABNORMAL HIGH (ref 0–40)

## 2016-06-28 LAB — URINALYSIS, ROUTINE W REFLEX MICROSCOPIC
Bilirubin Urine: NEGATIVE
Glucose, UA: NEGATIVE mg/dL
Hgb urine dipstick: NEGATIVE
Ketones, ur: NEGATIVE mg/dL
Leukocytes, UA: NEGATIVE
Nitrite: NEGATIVE
Protein, ur: NEGATIVE mg/dL
Specific Gravity, Urine: 1.006 (ref 1.005–1.030)
pH: 6 (ref 5.0–8.0)

## 2016-06-28 MED ORDER — ATORVASTATIN CALCIUM 10 MG PO TABS
20.0000 mg | ORAL_TABLET | Freq: Every day | ORAL | Status: DC
  Administered 2016-06-28: 20 mg via ORAL
  Filled 2016-06-28: qty 2

## 2016-06-28 NOTE — Progress Notes (Signed)
  Echocardiogram 2D Echocardiogram has been performed.  Jennette Dubin 06/28/2016, 2:18 PM

## 2016-06-28 NOTE — Care Management Note (Signed)
Case Management Note  Patient Details  Name: Regina James MRN: [id number] Date of Birth: July 06, 1933  Subjective/Objective:    Pt admitted with CVA. She is from home with her spouse.                 Action/Plan: PT recommending outpatient therapy and OT [facility] services. CM following for d/c needs, physician orders.  Expected Discharge Date:                  Expected Discharge Plan:     In-House Referral:     Discharge planning Services     Post Acute Care Choice:    Choice offered to:     DME Arranged:    DME Agency:     HH Arranged:    HH Agency:     Status of Service:  In process, will continue to follow  If discussed at Long Length of Stay Meetings, dates discussed:    Additional Comments:  Pollie Friar, RN 06/28/2016, 2:30 PM

## 2016-06-28 NOTE — Consult Note (Signed)
Patient ID: ASHLEE PLAYER MRN: [id number], DOB/AGE: November 26, 1933   Admit date: 06/27/2016   Reason for Consult: Arrhthymias in the setting of CVA (?SVT vs Afib) Requesting MD: Dr. Eliseo Squires, Internal Medicine    Primary Physician: Tawanna Solo, MD Primary Cardiologist: Dr. Ellyn Hack   Pt. Profile:  83 y/o female with prior h/o PSVT and tobacco abuse admitted for acute CVA. Cardiology consulted for possible SVT vs ? afib.   Problem List  Past Medical History:  Diagnosis Date  . Atrial fibrillation ([place])   . Cancer ([place])    basal cell carcinoma  . Palpitations     Past Surgical History:  Procedure Laterality Date  . APPENDECTOMY    . CHOLECYSTECTOMY    . ROTATOR CUFF REPAIR    . US ECHOCARDIOGRAPHY  01/09/2009   EF 55-60%  . WRIST FRACTURE SURGERY       Allergies  Allergies  Allergen Reactions  . Morphine And Related Swelling  . Levofloxacin Anxiety    Double vision    HPI  The patient is a 83 y/o female, previously followed by Dr. Mare Ferrari. She recently established care with Dr. Ellyn Hack in February, following Dr. Sherryl Barters retirement. She has been followed for PSVT. She also has a h/o tobacco use. No history of ischemic heart disease. Normal EF by echo in the past with some diastolic dysfunction and mild aortic sclerosis also noted. Her PSVT has been controlled with BB therapy.   She presented to [facility] on 3/25 with a  CC of right arm weakness and numbness. Head CT was negative for acute infarct, however MRI confirmed widely scattered but small, mostly cortically based, acute infarcts in the posterior left MCA and the superior left PCA territories. 2D echo shows normal LVEF at 55-60% with grade 1DD. No atrial septal defect or PFO identified. The LA is moderately dilated. CT of neck demonstrated mild carotid artery disease with stenosis of up to 30% on the right and 40-50% on the left. She is now on Plavix. There is concern regarding arrhthymias on telemetry, (primary  team questioning if PSVT vs atrial fibrillation). Cardiology has been asked to assess and to provide recommendations. She denies any recent palpitations.    Home Medications  Prior to Admission medications   Medication Sig Start Date End Date Taking? Authorizing Provider  aspirin 81 MG tablet Take 81 mg by mouth daily.   Yes Historical Provider, MD  Doxylamine Succinate, Sleep, (SLEEP AID PO) Take 1 tablet by mouth at bedtime.    Yes Historical Provider, MD  metoprolol succinate (TOPROL-XL) 100 MG 24 hr tablet Take  0.5 tablet ( 50 mg total) once daily 05/19/16  Yes Leonie Man, MD  Multiple Vitamin (MULTIVITAMIN) tablet Take 1 tablet by mouth daily.     Yes Historical Provider, MD  Polyethyl Glycol-Propyl Glycol (SYSTANE OP) Place 1 drop into both eyes daily as needed. For dry eyes   Yes Historical Provider, MD   Hospital Medications  . atorvastatin  20 mg Oral q1800  . clopidogrel  75 mg Oral Daily  . enoxaparin (LOVENOX) injection  40 mg Subcutaneous Q24H  . fluticasone  2 spray Each Nare Daily  . metoprolol succinate  50 mg Oral Daily  . nicotine  7 mg Transdermal Daily    Family History  Family History  Problem Relation Age of Onset  . Cancer Father   . Diabetes Father   . Varicose Veins Mother   . Cardiomyopathy Mother   . Kidney cancer  Brother   . Arrhythmia Brother     Social History  Social History   Social History  . Marital status: Married    Spouse name: N/A  . Number of children: N/A  . Years of education: N/A   Occupational History  . Not on file.   Social History Main Topics  . Smoking status: Current Every Day Smoker    Packs/day: 0.25    Years: 45.00    Types: Cigarettes  . Smokeless tobacco: Never Used  . Alcohol use 4.2 oz/week    7 Glasses of wine per week  . Drug use: No  . Sexual activity: Not on file   Other Topics Concern  . Not on file   Social History Narrative  . No narrative on file     Review of Systems General:  No  chills, fever, night sweats or weight changes.  Cardiovascular:  No chest pain, dyspnea on exertion, edema, orthopnea, palpitations, paroxysmal nocturnal dyspnea. Dermatological: No rash, lesions/masses Respiratory: No cough, dyspnea Urologic: No hematuria, dysuria Abdominal:   No nausea, vomiting, diarrhea, bright red blood per rectum, melena, or hematemesis Neurologic:  No visual changes, wkns, changes in mental status. All other systems reviewed and are otherwise negative except as noted above.  Physical Exam  Blood pressure (!) 127/55, pulse 66, temperature 98.1 F (36.7 C), temperature source Oral, resp. rate 18, height 5\' 6"  (1.676 m), weight 155 lb (70.3 kg), SpO2 98 %.  General: Pleasant, NAD Psych: Normal affect. Neuro: Alert and oriented X 3. Moves all extremities spontaneously. HEENT: Normal  Neck: Supple without bruits or JVD. Lungs:  Resp regular and unlabored, CTA. Heart: RRR no s3, s4, or murmurs. Abdomen: Soft, non-tender, non-distended, BS + x 4.  Extremities: No clubbing, cyanosis or edema. DP/PT/Radials 2+ and equal bilaterally.  Labs  Troponin [facility] Test)  Recent Labs  06/27/16 1117  TROPIPOC 0.00   No results for input(s): CKTOTAL, CKMB, TROPONINI in the last 72 hours. Lab Results  Component Value Date   WBC 8.5 06/27/2016   HGB 15.3 (H) 06/27/2016   HCT 45.3 06/27/2016   MCV 96.6 06/27/2016   PLT 286 06/27/2016    Recent Labs Lab 06/27/16 1059  NA 142  K 4.3  CL 108  CO2 23  BUN 11  CREATININE 0.87  CALCIUM 9.1  GLUCOSE 96   Lab Results  Component Value Date   CHOL 167 06/28/2016   HDL 31 (L) 06/28/2016   LDLCALC 95 06/28/2016   TRIG 206 (H) 06/28/2016   No results found for: DDIMER   Radiology/Studies  Ct Angio Head W Or Wo Contrast  Result Date: 06/27/2016 CLINICAL DATA:  Initial evaluation for acute stroke.  The EXAM: CT ANGIOGRAPHY HEAD AND NECK TECHNIQUE: Multidetector CT imaging of the head and neck was performed  using the standard protocol during bolus administration of intravenous contrast. Multiplanar CT image reconstructions and MIPs were obtained to evaluate the vascular anatomy. Carotid stenosis measurements (when applicable) are obtained utilizing NASCET criteria, using the distal internal carotid diameter as the denominator. CONTRAST:  50 cc of Isovue 370. COMPARISON:  Prior MRI and CT from earlier the same day. FINDINGS: CTA NECK FINDINGS Aortic arch: Aneurysmal dilatation of the ascending aorta up to 4.6 cm in maximal diameter. Superimposed penetrating atheromatous ulcer at the undersurface of the aortic arch without significant intraluminal thrombus (series 7, image 337). Normal 3 branch morphology. 40% atheromatous narrowing at the origin of the left common carotid artery (series  8, image 103). Approximately 30-40% stenoses at the origin of the subclavian arteries as well. Multifocal irregularity seen throughout the visualized subclavian arteries. There is a long segment stenosis of approximately 80% within knee distal left subclavian artery (series 8, image 121). Right carotid system: Right common carotid artery patent from its origin to the bifurcation without flow-limiting stenosis. Multifocal atheromatous irregularity about the right bifurcation/ proximal right ICA with mild narrowing of up to 30% by NASCET criteria. Right ICA irregular but patent distally to the skullbase without flow-limiting stenosis. Left carotid system: 40% atheromatous narrowing at the origin the left common carotid artery. Scattered eccentric noncalcified plaque within the left common carotid artery without flow-limiting stenosis. Calcified noncalcified plaque about the left bifurcation/ proximal left ICA. Associated narrowing of up to 40-50% by NASCET criteria (series 7, image 216). Left ICA widely patent distally to the skullbase. Vertebral arteries: Both of the vertebral arteries arise from the subclavian arteries. Left vertebral  artery dominant. Vertebral arteries widely patent within the neck without stenosis or occlusion. Skeleton: Moderate degenerative spondylolysis present at C4-5 through C6-7. Multilevel facet arthrosis. No acute osseus abnormality. No worrisome lytic or blastic osseous lesions. Other neck: No acute soft tissue abnormality within the neck. No adenopathy. Asymmetric enlargement of the right thyroid gland with hypodense nodules measure up to 1 cm, likely related to goiter. Upper chest: Visualized upper mediastinum within normal limits. Scattered atelectatic changes present within the lungs. Visualized lungs are otherwise clear. Review of the MIP images confirms the above findings CTA HEAD FINDINGS Anterior circulation: Petrous segments widely patent bilaterally. Minimal scattered plaque within the cavernous ICAs without flow-limiting stenosis. Supraclinoid segments widely patent. ICA termini patent. A1 segments, anterior communicating artery common anterior cerebral artery widely patent. M1 segments patent without stenosis or occlusion. No proximal M2 occlusion. Distal MCA branches well opacified and symmetric. Posterior circulation: Dominant left vertebral artery widely patent to the vertebrobasilar junction. Hypoplastic right vertebral artery terminates in PICA. Posterior inferior cerebral arteries patent bilaterally. Basilar artery tortuous but widely patent to its distal aspect. Superior cerebral arteries patent bilaterally. Both of the posterior cerebral artery is predominantly supplied via the basilar artery and are widely patent to their distal aspects. Small right posterior communicating artery noted. Venous sinuses: Patent.  Left transverse sinus hypoplastic. Anatomic variants: Hypoplastic right vertebral artery terminates in PICA. No other significant anatomic variant. No aneurysm or vascular malformation. Delayed phase: No pathologic enhancement. Review of the MIP images confirms the above findings IMPRESSION:  CTA NECK IMPRESSION: 1. Atheromatous plaque about the carotid bifurcations bilaterally with associated stenosis of up to 30% on the right and 40-50% on the left. 2. Aneurysmal dilatation of the ascending aorta up to 4.6 cm. Superimposed penetrating atheromatous ulcer at the aortic arch. 3. Atheromatous narrowing of approximately 40% at the origin of the great vessels. 4. Severe 80% left subclavian artery stenosis as above. CTA HEAD IMPRESSION: 1. Negative CTA for large vessel occlusion. 2. Mild for age plaque at the carotid siphons without flow-limiting stenosis. Otherwise widely patent anterior and posterior circulation. 3. Basilar artery supplied via at the dominant left vertebral artery. Hypoplastic right vertebral artery terminates in PICA. Electronically Signed   By: Jeannine Boga M.D.   On: 06/27/2016 23:55   Ct Head Wo Contrast  Result Date: 06/27/2016 CLINICAL DATA:  Right upper extremity weakness EXAM: CT HEAD WITHOUT CONTRAST TECHNIQUE: Contiguous axial images were obtained from the base of the skull through the vertex without intravenous contrast. COMPARISON:  March 23, 2007 FINDINGS:  Brain: The ventricles are normal in size and configuration. There is no intracranial mass, hemorrhage, extra-axial fluid collection, or midline shift. There is slight small vessel disease in the centra semiovale bilaterally. Elsewhere gray-white compartments appear normal. No evident acute infarct. Vascular: No hyperdense vessel. There is calcification in each carotid siphon region. Skull: The bony calvarium appears intact. Sinuses/Orbits: There is mucosal thickening in multiple ethmoid air cells bilaterally. Opacification is greatest in several anterior right-sided ethmoid air cells. Visualized paranasal sinuses otherwise are clear. There is leftward deviation of the nasal septum. Orbits appear symmetric bilaterally. Other: Mastoid air cells are clear. IMPRESSION: Slight periventricular small vessel disease.  No intracranial mass, hemorrhage, or extra-axial fluid collection. No acute infarct evident. Foci of vascular calcification noted. Areas of ethmoid sinus disease noted, more severe on the right than on the left. There is leftward deviation of the nasal septum. Electronically Signed   By: Lowella Grip III M.D.   On: 06/27/2016 10:56   Ct Angio Neck W Or Wo Contrast  Result Date: 06/27/2016 CLINICAL DATA:  Initial evaluation for acute stroke.  The EXAM: CT ANGIOGRAPHY HEAD AND NECK TECHNIQUE: Multidetector CT imaging of the head and neck was performed using the standard protocol during bolus administration of intravenous contrast. Multiplanar CT image reconstructions and MIPs were obtained to evaluate the vascular anatomy. Carotid stenosis measurements (when applicable) are obtained utilizing NASCET criteria, using the distal internal carotid diameter as the denominator. CONTRAST:  50 cc of Isovue 370. COMPARISON:  Prior MRI and CT from earlier the same day. FINDINGS: CTA NECK FINDINGS Aortic arch: Aneurysmal dilatation of the ascending aorta up to 4.6 cm in maximal diameter. Superimposed penetrating atheromatous ulcer at the undersurface of the aortic arch without significant intraluminal thrombus (series 7, image 337). Normal 3 branch morphology. 40% atheromatous narrowing at the origin of the left common carotid artery (series 8, image 103). Approximately 30-40% stenoses at the origin of the subclavian arteries as well. Multifocal irregularity seen throughout the visualized subclavian arteries. There is a long segment stenosis of approximately 80% within knee distal left subclavian artery (series 8, image 121). Right carotid system: Right common carotid artery patent from its origin to the bifurcation without flow-limiting stenosis. Multifocal atheromatous irregularity about the right bifurcation/ proximal right ICA with mild narrowing of up to 30% by NASCET criteria. Right ICA irregular but patent  distally to the skullbase without flow-limiting stenosis. Left carotid system: 40% atheromatous narrowing at the origin the left common carotid artery. Scattered eccentric noncalcified plaque within the left common carotid artery without flow-limiting stenosis. Calcified noncalcified plaque about the left bifurcation/ proximal left ICA. Associated narrowing of up to 40-50% by NASCET criteria (series 7, image 216). Left ICA widely patent distally to the skullbase. Vertebral arteries: Both of the vertebral arteries arise from the subclavian arteries. Left vertebral artery dominant. Vertebral arteries widely patent within the neck without stenosis or occlusion. Skeleton: Moderate degenerative spondylolysis present at C4-5 through C6-7. Multilevel facet arthrosis. No acute osseus abnormality. No worrisome lytic or blastic osseous lesions. Other neck: No acute soft tissue abnormality within the neck. No adenopathy. Asymmetric enlargement of the right thyroid gland with hypodense nodules measure up to 1 cm, likely related to goiter. Upper chest: Visualized upper mediastinum within normal limits. Scattered atelectatic changes present within the lungs. Visualized lungs are otherwise clear. Review of the MIP images confirms the above findings CTA HEAD FINDINGS Anterior circulation: Petrous segments widely patent bilaterally. Minimal scattered plaque within the cavernous ICAs without flow-limiting  stenosis. Supraclinoid segments widely patent. ICA termini patent. A1 segments, anterior communicating artery common anterior cerebral artery widely patent. M1 segments patent without stenosis or occlusion. No proximal M2 occlusion. Distal MCA branches well opacified and symmetric. Posterior circulation: Dominant left vertebral artery widely patent to the vertebrobasilar junction. Hypoplastic right vertebral artery terminates in PICA. Posterior inferior cerebral arteries patent bilaterally. Basilar artery tortuous but widely patent  to its distal aspect. Superior cerebral arteries patent bilaterally. Both of the posterior cerebral artery is predominantly supplied via the basilar artery and are widely patent to their distal aspects. Small right posterior communicating artery noted. Venous sinuses: Patent.  Left transverse sinus hypoplastic. Anatomic variants: Hypoplastic right vertebral artery terminates in PICA. No other significant anatomic variant. No aneurysm or vascular malformation. Delayed phase: No pathologic enhancement. Review of the MIP images confirms the above findings IMPRESSION: CTA NECK IMPRESSION: 1. Atheromatous plaque about the carotid bifurcations bilaterally with associated stenosis of up to 30% on the right and 40-50% on the left. 2. Aneurysmal dilatation of the ascending aorta up to 4.6 cm. Superimposed penetrating atheromatous ulcer at the aortic arch. 3. Atheromatous narrowing of approximately 40% at the origin of the great vessels. 4. Severe 80% left subclavian artery stenosis as above. CTA HEAD IMPRESSION: 1. Negative CTA for large vessel occlusion. 2. Mild for age plaque at the carotid siphons without flow-limiting stenosis. Otherwise widely patent anterior and posterior circulation. 3. Basilar artery supplied via at the dominant left vertebral artery. Hypoplastic right vertebral artery terminates in PICA. Electronically Signed   By: Jeannine Boga M.D.   On: 06/27/2016 23:55   Mr Brain Wo Contrast (neuro Protocol)  Result Date: 06/27/2016 CLINICAL DATA:  83 year old female with acute onset right arm weakness and numbness upon waking at 0415 hours today. Possible slurred speech at that time. Initial encounter. EXAM: MRI HEAD WITHOUT CONTRAST TECHNIQUE: Multiplanar, multiecho pulse sequences of the brain and surrounding structures were obtained without intravenous contrast. COMPARISON:  Head CT without contrast 1044 hours today and earlier. Brain MRI 08/03/2005 FINDINGS: Brain: Fairly widely scattered small  cortical and occasionally white matter foci of restricted diffusion in the posterior left hemisphere including the peri-Rolandic cortex at the right upper extremity representation area (series 4, image 41) the right parietal lobe, as well as the superior and lateral aspect of the left occipital lobe and left occipital pole. No significant associated T2 or FLAIR hyperintensity at this time. No associated hemorrhage or mass effect. No contralateral right hemisphere, deep gray matter nuclei, or posterior fossa restricted diffusion. No midline shift, mass effect, evidence of mass lesion, ventriculomegaly, extra-axial collection or acute intracranial hemorrhage. Cervicomedullary junction and pituitary are within normal limits. Cerebral volume is mildly decreased since 2007 but remains normal for age. Outside of the acute findings there is scattered mostly chronic nonspecific small foci of cerebral white matter T2 and FLAIR hyperintensity. The left hemisphere is slightly more affected. No chronic cortical encephalomalacia or chronic cerebral blood products are identified. Deep gray matter nuclei, brainstem, and cerebellum remain normal. Vascular: Major intracranial vascular flow voids are stable since the 2007 MRI, including dominant distal left vertebral artery. Skull and upper cervical spine: Negative. Normal bone marrow signal. Sinuses/Orbits: Postoperative changes to both globes since the prior MRI. Otherwise negative orbit soft tissues Visualized paranasal sinuses and mastoids are stable and well pneumatized. Other: Visible internal auditory structures appear normal. Negative scalp soft tissues. IMPRESSION: 1. Widely scattered but small, mostly cortically based, acute infarcts in the posterior Left MCA and  the superior Left PCA territories. 2. No associated hemorrhage or mass effect. 3. Otherwise largely stable since 2007 and unremarkable for age noncontrast MRI appearance of the brain. Electronically Signed   By: Genevie Ann M.D.   On: 06/27/2016 14:05    ECG  06/27/16 -- sinus bradycardia 57 bpm -- personally reviewed  Telemetry- NSR with PSVT (short burst of PAT) noted earlier today. Does not appear to be afib. -- personally reviewed with MD.  Echocardiogram 06/28/16  - Left ventricle: The cavity size was normal. There was mild concentric hypertrophy. Systolic function was normal. EF 55% to 60%. Wall motion was normal w/o  regional wall motion abnormalities. Doppler parameters are consistent with abnormal LV relaxation (grade 1 diastolic dysfunction) w/ high ventricular filling pressure (LVEDP) - Aortic valve: Transvalvular velocity was within the normal range. There was no stenosis. There was no regurgitation. - Mitral valve: Transvalvular velocity was within the normal range.  There was no evidence for stenosis. There was no regurgitation. - Left atrium: The atrium was moderately dilated.  - Right ventricle: The cavity size was normal. Wall thickness was normal. Systolic function was normal. - Atrial septum: No defect or patent foramen ovale was identified. - Tricuspid valve: There was mild regurgitation. - Pulmonary arteries: Systolic pressure was within the normal range. PA peak pressure: 30 mm Hg (S).     ASSESSMENT AND PLAN  Principal Problem:   Right arm weakness Active Problems:   Tobacco abuse   Paroxysmal SVT (supraventricular tachycardia) (HCC)   Right arm numbness   Deviated septum   Disorder of ethmoidal sinus   Acute ischemic stroke (HCC)   Dysarthria   1. Acute CVA: MRI confirmed widely scattered but small, mostly cortically based, acute infarcts in the posterior left MCA and the superior left PCA territories. 2D echo shows normal LVEF at 55-60% with grade 1DD. No atrial septal defect or PFO identified. The LA is moderately dilated. CT of neck demonstrated mild carotid artery disease with stenosis of up to 30% on the right and 40-50% on the left. Telemetry shows predominantely NSR  however she had a brief tachy arrthymia earlier today, which appears to be PAT. I don't think this was afib. Recommend  Considering TEE to r/o cardiac source. If negative TEE, recommend implantable loop recorder to monitor for paroxsymal atrial fibrillation/flutter. Continue Plavix and statin for now. We will also ask EP to review telemetry in the am to see if they agree that this is PAT and not AFib. We will ask their recs regarding loop recorder.   2. H/o PSVT: currently NSR. Continue BB for rate control.    Signed, Lyda Jester, PA-C 06/28/2016, 4:37 PM  I have seen, examined and evaluated the patient this PM along with Ellen Henri, PA-C.  After reviewing all the available data and chart, we discussed the patients laboratory, study & physical findings as well as symptoms in detail. I agree with her findings, examination as well as impression recommendations as per our discussion.    Unfortunately, she has had clear evidence of stroke which looks like a shower of emboli -- we have been asked to assess for cardiac evaluation. The monitor strips show a brief episode of PAT. If anything there may be 15-20 beats on most of which do have a potential P-wave involved. This is awfully short burst to be PAF. Somewhere, somehow atrial fibrillation was put on her past medical history, but this was not the case when I saw her roughly a  month ago. In no note by her prior cardiologist (Dr. Mare Ferrari) was ever mention of atrial fibrillation. She was unaware of what the diagnosis was. I do think we need to exclude A. fib, because this type of PAT potentially could evolve into A. fib. -- I agree with discussing potential loop recorder with EP.  Echocardiogram did not show any cardioembolic source, as is usually the case bubble study would've been more helpful. However this point I think TEE with bubble study would be more conclusive.  At this point I agree with Plavix until we have proven the presence of  A. fib.  I spent over half an hour myself in addition to the time spent by [place], [place] (30 min + chart review)  in direct discussion explaining atrial fibrillation versus PSVT, full anticoagulation with ELIQUIS/Xarelto versus Plavix. We also then talked about atorvastatin which is being administered for atherosclerosis.  > 50 % of the time in direct counseling.    Glenetta Hew, M.D., M.S. Interventional Cardiologist   Pager # [phone] Phone # [phone] [id number] [place], [place] [address]

## 2016-06-28 NOTE — Progress Notes (Signed)
STROKE TEAM PROGRESS NOTE   HISTORY OF PRESENT ILLNESS (per record) This is an 83 year old right-handed woman who presented to the emergency department for evaluation of right arm weakness. History is obtained directly from the patient was an excellent historian.  The patient reports that she woke up this morning at 4:15 and realized that she was having difficulty moving her right arm. In fact, she initially did not recognize it as her own arm and thought something else was in bed with her. Once she realized that this was indeed her arm, she noticed that she was having difficulty moving it. She also reports that she has some numbness. She thinks it was a sensation of numbness that woke her from sleep but is not certain. The last time she was known to be normal was when she went to bed last night. In the ED, she was noted to have ongoing weakness in the right arm. She has been admitted for further evaluation of presumed stroke. MRI scan of the brain has since been obtained and indeed shows numerous scattered areas of acute ischemia involving the left frontal and parietal lobes. Neurology consultation is now requested for further evaluation.  The patient reports that her right arm symptoms have improved somewhat over the course of the day. However, she continues to have significant difficulty using her right hand and has poor dexterity on that side. She denies any other symptoms, specifically reporting no slurred speech, no difficulty expressing herself, no difficulty comprehending, no vision changes, no trouble swallowing, and no left-sided symptoms. She initially did not call 911 but did take two full dose aspirins. She spoke to her daughter who says that she also seemed have a little bit of slurred speech. Her daughter encouraged her to call 911 and to come to the hospital for further evaluation.  The patient denies any prior episodes of focal neurologic deficits. She's never been told she has had TIA  or stroke. She had a reported history of atrial fibrillation many years ago. More recently, according to cardiology notes, she carries a diagnosis of supraventricular tachycardia without mention of atrial fibrillation. She takes aspirin 81 mg daily.  Last known well: prior to going to bed the night of 06/26/16 NHISS score: 0 tPA given?: No, outside of window   SUBJECTIVE (INTERVAL HISTORY) Her husband and daughter as well as other family members are at the bedside.  She feels good, only continued to have mild right hand weakness. Following with cardiology for paroxysmal SVT, not sure if has afib in the past. Not willing to quit smoking, but agree to start statin.    OBJECTIVE Temp:  [97.6 F (36.4 C)-98.6 F (37 C)] 97.6 F (36.4 C) (03/26 0441) Pulse Rate:  [54-69] 62 (03/26 0441) Cardiac Rhythm: Normal sinus rhythm (03/25 2009) Resp:  [16-20] 18 (03/26 0441) BP: (102-154)/(55-114) 127/55 (03/26 0441) SpO2:  [96 %-100 %] 98 % (03/26 0441) Weight:  [70.3 kg (155 lb)] 70.3 kg (155 lb) (03/25 1003)  CBC:  Recent Labs Lab 06/27/16 1059  WBC 8.5  NEUTROABS 5.3  HGB 15.3*  HCT 45.3  MCV 96.6  PLT 921    Basic Metabolic Panel:  Recent Labs Lab 06/27/16 1059  NA 142  K 4.3  CL 108  CO2 23  GLUCOSE 96  BUN 11  CREATININE 0.87  CALCIUM 9.1    Lipid Panel:    Component Value Date/Time   CHOL 167 06/28/2016 0416   TRIG 206 (H) 06/28/2016 0416   HDL  31 (L) 06/28/2016 0416   CHOLHDL 5.4 06/28/2016 0416   VLDL 41 (H) 06/28/2016 0416   LDLCALC 95 06/28/2016 0416   HgbA1c: No results found for: HGBA1C Urine Drug Screen: No results found for: LABOPIA, COCAINSCRNUR, LABBENZ, AMPHETMU, THCU, LABBARB    IMAGING I have personally reviewed the radiological images below and agree with the radiology interpretations.  Ct Head Wo Contrast 06/27/2016 Slight periventricular small vessel disease. No intracranial mass, hemorrhage, or extra-axial fluid collection. No acute infarct  evident. Foci of vascular calcification noted. Areas of ethmoid sinus disease noted, more severe on the right than on the left. There is leftward deviation of the nasal septum.   Ct Angio Head W Or Wo Contrast Result Date: 06/27/2016 1. Negative CTA for large vessel occlusion. 2. Mild for age plaque at the carotid siphons without flow-limiting stenosis. Otherwise widely patent anterior and posterior circulation. 3. Basilar artery supplied via at the dominant left vertebral artery. Hypoplastic right vertebral artery terminates in PICA.   Ct Angio Neck W Or Wo Contrast 06/27/2016 1. Atheromatous plaque about the carotid bifurcations bilaterally with associated stenosis of up to 30% on the right and 40-50% on the left. 2. Aneurysmal dilatation of the ascending aorta up to 4.6 cm. Superimposed penetrating atheromatous ulcer at the aortic arch. 3. Atheromatous narrowing of approximately 40% at the origin of the great vessels. 4. Severe 80% left subclavian artery stenosis as above.   Mr Brain Wo Contrast (neuro Protocol) 06/27/2016 1. Widely scattered but small, mostly cortically based, acute infarcts in the posterior Left MCA and the superior Left PCA territories. 2. No associated hemorrhage or mass effect. 3. Otherwise largely stable since 2007 and unremarkable for age noncontrast MRI appearance of the brain.   LE venous doppler - no DVT.  TTE pending   PHYSICAL EXAM  Temp:  [97.6 F (36.4 C)-98.6 F (37 C)] 98.1 F (36.7 C) (03/26 1035) Pulse Rate:  [55-66] 66 (03/26 1035) Resp:  [18-20] 18 (03/26 1035) BP: (124-141)/(55-85) 127/55 (03/26 0441) SpO2:  [98 %-100 %] 98 % (03/26 1035)  General - Well nourished, well developed, in no apparent distress.  Ophthalmologic - Sharp disc margins OU.   Cardiovascular - Regular rate and rhythm.  Mental Status -  Level of arousal and orientation to time, place, and person were intact. Language including expression, naming, repetition, comprehension  was assessed and found intact. Attention span and concentration were normal. Fund of Knowledge was assessed and was intact.  Cranial Nerves II - XII - II - Visual field intact OU. III, IV, VI - Extraocular movements intact. V - Facial sensation intact bilaterally. VII - Facial movement intact bilaterally. VIII - Hearing & vestibular intact bilaterally. X - Palate elevates symmetrically. XI - Chin turning & shoulder shrug intact bilaterally. XII - Tongue protrusion intact.  Motor Strength - The patient's strength was normal in all extremities except right hand dexterity difficulty and pronator drift was present.  Bulk was normal and fasciculations were absent.   Motor Tone - Muscle tone was assessed at the neck and appendages and was normal.  Reflexes - The patient's reflexes were 1+ in all extremities and she had no pathological reflexes.  Sensory - Light touch, temperature/pinprick were assessed and were symmetrical.    Coordination - The patient had normal movements in the hands and feet with no ataxia or dysmetria.  Tremor was absent.  Gait and Station - deferred.   ASSESSMENT/PLAN Ms. NICOLENA SCHURMAN is a 83 y.o. female with  history of SVT, cataracts and vasal cell carcinoma presenting with R arm weakness and numbness. She did not receive IV t-PA due to being out of the window.   Stroke:   Scattered L MCA infarcts, embolic secondary to unclear source. Pt does have multiple large vessel vasculopathy, including left ICA, left subclavian A, aortic atheroma. However, pt also has paroxysmal SVT concerning for pAfib. Aortic aneurysm is also a potential embolic source.  Resultant  R hand weakness, pronator drift  CT head small vessel disease. No acute infarct. Ethmoid sinus diease R >L. Leftward deviation of septum  CTA head no LVO. ICA plaque w/o flow limiting stenosis.   CTA neck L 40-50% stenosis. Aneurysmal dilatation of ascending aorta 4.6 cm. Penetrating atheromatous ulcer at  the aortic arch. L SCA 80% stenosis distal to [facility] origin  MRI  scattered L MCA territory infarcts  2D Echo  pending  LE venous doppler no DVT  LDL 95  HgbA1c pending  Lovenox 40 mg sq daily for VTE prophylaxis  Diet Heart Room service appropriate? Yes; Fluid consistency: Thin  aspirin 81 mg daily prior to admission, changed to clopidogrel 75 mg daily.   Patient counseled to be compliant with her antithrombotic medications  Ongoing aggressive stroke risk factor management  Therapy recommendations:  pending   Disposition:  pending   Paroxysmal SVT Palpitations - PACs and PVCs  Followed by Dr. Mare Ferrari and Dr. Ellyn Hack  No mention of atrial fibrillation or hx of atrial fibrillation on the record  On ASA at home, now on plavix  Given embolic stroke pattern, recommend cardiology consultation to consider TEE/loop recorder vs. Anticoagulation.   Aortic arch aneurysm  CTA showed aortic arch aneurysm 4.6cm  Penetrating atheromatous ulcer at aortic arch  Could be potential embolic source of stroke  Recommend [facility] consultation  Left subclavian A stenosis  80% stenosis distal to VA origin  Asymptomatic  Recommend outpt follow up with [facility].  Hypertension  On toprol XL 100 at home  Stable Permissive hypertension (OK if < 220/120) but gradually normalize in 5-7 days Long-term BP goal normotensive  Hyperlipidemia  Home meds:  No statin  LDL 95, goal < 70  Add lipitor 20mg   Continue statin at discharge  Tobacco abuse  Current smoker  Smoking cessation counseling provided  Nicotine patch provided  Pt is not willing to quit at this time  Other Stroke Risk Factors  Advanced age  ETOH use, advised to drink no more than 1 drink(s) a day  Other Active Problems  Ethmoid sinus diease  Deviated septum  Hospital day # 0  [name], MD PhD Stroke Neurology 06/28/2016 3:59 PM   To contact Stroke Continuity provider, please refer to  [email or web address] After hours, contact General Neurology

## 2016-06-28 NOTE — Progress Notes (Signed)
VASCULAR LAB PRELIMINARY  PRELIMINARY  PRELIMINARY  PRELIMINARY  Bilateral lower extremity venous duplex completed.    Preliminary report:  There is no DVT or SVT noted in the bilateral lower extremities.   Kewanda [place], RVT 06/28/2016, 3:29 PM

## 2016-06-28 NOTE — Progress Notes (Signed)
PROGRESS NOTE    Regina James  UDJ:[id number] DOB: 1933-04-15 DOA: 06/27/2016 PCP: Tawanna Solo, MD   Outpatient Specialists:     Brief Narrative:  Regina James is a 83 y.o. female with medical history significant of SVT, cataracts, basal cell carcinoma. This morning around 4:15 AM, she reports waking up and feeling something beside her. She did not know what it was and got up to turn on the light. She realized at that time that it was her arm. She had numbness and weakness of her right arm which concerned her for a stroke. She took two full strength aspirin and, over a few minutes, her strength improved slightly but numbness continued. She went back to sleep, and woke up around 9:00 AM with persistent weakness and numbness of her right arm. Her daughter felt that she may have had some slurred speech at the time as well. MRI positive for CVA.  Assessment & Plan:   Principal Problem:   Right arm weakness Active Problems:   Tobacco abuse   Paroxysmal SVT (supraventricular tachycardia) (HCC)   Right arm numbness   Deviated septum   Disorder of ethmoidal sinus   Acute ischemic stroke (HCC)   Dysarthria   CVA with Right arm weakness -MRI + -echo pending -HYI:FOYDXAJOINOM plaque about the carotid bifurcations bilaterally with associated stenosis of up to 30% on the right and 40-50% on the left-- outpatient vascular follow up -aspirin changed to plavix -?SVT vs a fib-- cardiology consult  Right arm numbness Likely secondary to stroke.  PSVT Initially followed by Dr. Mare Ferrari but last saw Dr. Ellyn Hack. Rate slightly bradycardic but asymptomatic -continue metoprolol -cardiology consult for SVT vs a fib  Ethmoid sinus disease -intranasal steroids  Deviated septum Seen incidentally on CT scan. Left deviation -Outpatient management -intranasal steroids  Tobacco abuse Counseled. Patient has zero desire to quit smoking -nicotine patch   DVT prophylaxis:   scd  Code Status: Full Code   Family Communication:   Disposition Plan:    Consultants:   Cards  neuro    Subjective: Patient with no complaints  Objective: Vitals:   06/28/16 0000 06/28/16 0200 06/28/16 0441 06/28/16 1035  BP: 129/74 130/83 (!) 127/55   Pulse: (!) 55 (!) 57 62 66  Resp: 18 18 18 18   Temp: 98.3 F (36.8 C) 98.4 F (36.9 C) 97.6 F (36.4 C) 98.1 F (36.7 C)  TempSrc: Oral Oral Oral Oral  SpO2: 100% 99% 98% 98%  Weight:      Height:        Intake/Output Summary (Last 24 hours) at 06/28/16 1324 Last data filed at 06/28/16 0631  Gross per 24 hour  Intake          1018.75 ml  Output                0 ml  Net          1018.75 ml   Filed Weights   06/27/16 1003  Weight: 70.3 kg (155 lb)    Examination:  General exam: Appears calm and comfortable  Respiratory system: Clear to auscultation. Respiratory effort normal. Cardiovascular system: S1 & S2 heard, RRR. No JVD, murmurs, rubs, gallops or clicks. No pedal edema. Gastrointestinal system: Abdomen is nondistended, soft and nontender. No organomegaly or masses felt. Normal bowel sounds heard. Central nervous system: Alert and oriented. No focal neurological deficits.     Data Reviewed: I have personally reviewed following labs and imaging studies  CBC:  Recent  Labs Lab 06/27/16 1059  WBC 8.5  NEUTROABS 5.3  HGB 15.3*  HCT 45.3  MCV 96.6  PLT 341   Basic Metabolic Panel:  Recent Labs Lab 06/27/16 1059  NA 142  K 4.3  CL 108  CO2 23  GLUCOSE 96  BUN 11  CREATININE 0.87  CALCIUM 9.1   GFR: Estimated Creatinine Clearance: 47.5 mL/min (by C-G formula based on SCr of 0.87 mg/dL). Liver Function Tests: No results for input(s): AST, ALT, ALKPHOS, BILITOT, PROT, ALBUMIN in the last 168 hours. No results for input(s): LIPASE, AMYLASE in the last 168 hours. No results for input(s): AMMONIA in the last 168 hours. Coagulation Profile:  Recent Labs Lab 06/27/16 1059  INR  1.04   Cardiac Enzymes: No results for input(s): CKTOTAL, CKMB, CKMBINDEX, TROPONINI in the last 168 hours. BNP (last 3 results) No results for input(s): PROBNP in the last 8760 hours. HbA1C: No results for input(s): HGBA1C in the last 72 hours. CBG: No results for input(s): GLUCAP in the last 168 hours. Lipid Profile:  Recent Labs  06/28/16 0416  CHOL 167  HDL 31*  LDLCALC 95  TRIG 206*  CHOLHDL 5.4   Thyroid Function Tests: No results for input(s): TSH, T4TOTAL, FREET4, T3FREE, THYROIDAB in the last 72 hours. Anemia Panel: No results for input(s): VITAMINB12, FOLATE, FERRITIN, TIBC, IRON, RETICCTPCT in the last 72 hours. Urine analysis:    Component Value Date/Time   COLORURINE YELLOW 03/21/2007 1822   APPEARANCEUR CLEAR 03/21/2007 1822   LABSPEC 1.022 03/21/2007 1822   PHURINE 6.5 03/21/2007 1822   GLUCOSEU NEGATIVE 03/21/2007 1822   HGBUR NEGATIVE 03/21/2007 1822   BILIRUBINUR NEGATIVE 03/21/2007 1822   KETONESUR NEGATIVE 03/21/2007 1822   PROTEINUR NEGATIVE 03/21/2007 1822   UROBILINOGEN 1.0 03/21/2007 1822   NITRITE NEGATIVE 03/21/2007 1822   LEUKOCYTESUR  03/21/2007 1822    NEGATIVE MICROSCOPIC NOT DONE ON URINES WITH NEGATIVE PROTEIN, BLOOD, LEUKOCYTES, NITRITE, OR GLUCOSE <1000 mg/dL.      )No results found for this or any previous visit (from the past 240 hour(s)).    Anti-infectives    None       Radiology Studies: Ct Angio Head W Or Wo Contrast  Result Date: 06/27/2016 CLINICAL DATA:  Initial evaluation for acute stroke.  The EXAM: CT ANGIOGRAPHY HEAD AND NECK TECHNIQUE: Multidetector CT imaging of the head and neck was performed using the standard protocol during bolus administration of intravenous contrast. Multiplanar CT image reconstructions and MIPs were obtained to evaluate the vascular anatomy. Carotid stenosis measurements (when applicable) are obtained utilizing NASCET criteria, using the distal internal carotid diameter as the  denominator. CONTRAST:  50 cc of Isovue 370. COMPARISON:  Prior MRI and CT from earlier the same day. FINDINGS: CTA NECK FINDINGS Aortic arch: Aneurysmal dilatation of the ascending aorta up to 4.6 cm in maximal diameter. Superimposed penetrating atheromatous ulcer at the undersurface of the aortic arch without significant intraluminal thrombus (series 7, image 337). Normal 3 branch morphology. 40% atheromatous narrowing at the origin of the left common carotid artery (series 8, image 103). Approximately 30-40% stenoses at the origin of the subclavian arteries as well. Multifocal irregularity seen throughout the visualized subclavian arteries. There is a long segment stenosis of approximately 80% within knee distal left subclavian artery (series 8, image 121). Right carotid system: Right common carotid artery patent from its origin to the bifurcation without flow-limiting stenosis. Multifocal atheromatous irregularity about the right bifurcation/ proximal right ICA with mild narrowing of up to  30% by NASCET criteria. Right ICA irregular but patent distally to the skullbase without flow-limiting stenosis. Left carotid system: 40% atheromatous narrowing at the origin the left common carotid artery. Scattered eccentric noncalcified plaque within the left common carotid artery without flow-limiting stenosis. Calcified noncalcified plaque about the left bifurcation/ proximal left ICA. Associated narrowing of up to 40-50% by NASCET criteria (series 7, image 216). Left ICA widely patent distally to the skullbase. Vertebral arteries: Both of the vertebral arteries arise from the subclavian arteries. Left vertebral artery dominant. Vertebral arteries widely patent within the neck without stenosis or occlusion. Skeleton: Moderate degenerative spondylolysis present at C4-5 through C6-7. Multilevel facet arthrosis. No acute osseus abnormality. No worrisome lytic or blastic osseous lesions. Other neck: No acute soft tissue  abnormality within the neck. No adenopathy. Asymmetric enlargement of the right thyroid gland with hypodense nodules measure up to 1 cm, likely related to goiter. Upper chest: Visualized upper mediastinum within normal limits. Scattered atelectatic changes present within the lungs. Visualized lungs are otherwise clear. Review of the MIP images confirms the above findings CTA HEAD FINDINGS Anterior circulation: Petrous segments widely patent bilaterally. Minimal scattered plaque within the cavernous ICAs without flow-limiting stenosis. Supraclinoid segments widely patent. ICA termini patent. A1 segments, anterior communicating artery common anterior cerebral artery widely patent. M1 segments patent without stenosis or occlusion. No proximal M2 occlusion. Distal MCA branches well opacified and symmetric. Posterior circulation: Dominant left vertebral artery widely patent to the vertebrobasilar junction. Hypoplastic right vertebral artery terminates in PICA. Posterior inferior cerebral arteries patent bilaterally. Basilar artery tortuous but widely patent to its distal aspect. Superior cerebral arteries patent bilaterally. Both of the posterior cerebral artery is predominantly supplied via the basilar artery and are widely patent to their distal aspects. Small right posterior communicating artery noted. Venous sinuses: Patent.  Left transverse sinus hypoplastic. Anatomic variants: Hypoplastic right vertebral artery terminates in PICA. No other significant anatomic variant. No aneurysm or vascular malformation. Delayed phase: No pathologic enhancement. Review of the MIP images confirms the above findings IMPRESSION: CTA NECK IMPRESSION: 1. Atheromatous plaque about the carotid bifurcations bilaterally with associated stenosis of up to 30% on the right and 40-50% on the left. 2. Aneurysmal dilatation of the ascending aorta up to 4.6 cm. Superimposed penetrating atheromatous ulcer at the aortic arch. 3. Atheromatous  narrowing of approximately 40% at the origin of the great vessels. 4. Severe 80% left subclavian artery stenosis as above. CTA HEAD IMPRESSION: 1. Negative CTA for large vessel occlusion. 2. Mild for age plaque at the carotid siphons without flow-limiting stenosis. Otherwise widely patent anterior and posterior circulation. 3. Basilar artery supplied via at the dominant left vertebral artery. Hypoplastic right vertebral artery terminates in PICA. Electronically Signed   By: Jeannine Boga M.D.   On: 06/27/2016 23:55   Ct Head Wo Contrast  Result Date: 06/27/2016 CLINICAL DATA:  Right upper extremity weakness EXAM: CT HEAD WITHOUT CONTRAST TECHNIQUE: Contiguous axial images were obtained from the base of the skull through the vertex without intravenous contrast. COMPARISON:  March 23, 2007 FINDINGS: Brain: The ventricles are normal in size and configuration. There is no intracranial mass, hemorrhage, extra-axial fluid collection, or midline shift. There is slight small vessel disease in the centra semiovale bilaterally. Elsewhere gray-white compartments appear normal. No evident acute infarct. Vascular: No hyperdense vessel. There is calcification in each carotid siphon region. Skull: The bony calvarium appears intact. Sinuses/Orbits: There is mucosal thickening in multiple ethmoid air cells bilaterally. Opacification is greatest in several  anterior right-sided ethmoid air cells. Visualized paranasal sinuses otherwise are clear. There is leftward deviation of the nasal septum. Orbits appear symmetric bilaterally. Other: Mastoid air cells are clear. IMPRESSION: Slight periventricular small vessel disease. No intracranial mass, hemorrhage, or extra-axial fluid collection. No acute infarct evident. Foci of vascular calcification noted. Areas of ethmoid sinus disease noted, more severe on the right than on the left. There is leftward deviation of the nasal septum. Electronically Signed   By: Lowella Grip III M.D.   On: 06/27/2016 10:56   Ct Angio Neck W Or Wo Contrast  Result Date: 06/27/2016 CLINICAL DATA:  Initial evaluation for acute stroke.  The EXAM: CT ANGIOGRAPHY HEAD AND NECK TECHNIQUE: Multidetector CT imaging of the head and neck was performed using the standard protocol during bolus administration of intravenous contrast. Multiplanar CT image reconstructions and MIPs were obtained to evaluate the vascular anatomy. Carotid stenosis measurements (when applicable) are obtained utilizing NASCET criteria, using the distal internal carotid diameter as the denominator. CONTRAST:  50 cc of Isovue 370. COMPARISON:  Prior MRI and CT from earlier the same day. FINDINGS: CTA NECK FINDINGS Aortic arch: Aneurysmal dilatation of the ascending aorta up to 4.6 cm in maximal diameter. Superimposed penetrating atheromatous ulcer at the undersurface of the aortic arch without significant intraluminal thrombus (series 7, image 337). Normal 3 branch morphology. 40% atheromatous narrowing at the origin of the left common carotid artery (series 8, image 103). Approximately 30-40% stenoses at the origin of the subclavian arteries as well. Multifocal irregularity seen throughout the visualized subclavian arteries. There is a long segment stenosis of approximately 80% within knee distal left subclavian artery (series 8, image 121). Right carotid system: Right common carotid artery patent from its origin to the bifurcation without flow-limiting stenosis. Multifocal atheromatous irregularity about the right bifurcation/ proximal right ICA with mild narrowing of up to 30% by NASCET criteria. Right ICA irregular but patent distally to the skullbase without flow-limiting stenosis. Left carotid system: 40% atheromatous narrowing at the origin the left common carotid artery. Scattered eccentric noncalcified plaque within the left common carotid artery without flow-limiting stenosis. Calcified noncalcified plaque about the  left bifurcation/ proximal left ICA. Associated narrowing of up to 40-50% by NASCET criteria (series 7, image 216). Left ICA widely patent distally to the skullbase. Vertebral arteries: Both of the vertebral arteries arise from the subclavian arteries. Left vertebral artery dominant. Vertebral arteries widely patent within the neck without stenosis or occlusion. Skeleton: Moderate degenerative spondylolysis present at C4-5 through C6-7. Multilevel facet arthrosis. No acute osseus abnormality. No worrisome lytic or blastic osseous lesions. Other neck: No acute soft tissue abnormality within the neck. No adenopathy. Asymmetric enlargement of the right thyroid gland with hypodense nodules measure up to 1 cm, likely related to goiter. Upper chest: Visualized upper mediastinum within normal limits. Scattered atelectatic changes present within the lungs. Visualized lungs are otherwise clear. Review of the MIP images confirms the above findings CTA HEAD FINDINGS Anterior circulation: Petrous segments widely patent bilaterally. Minimal scattered plaque within the cavernous ICAs without flow-limiting stenosis. Supraclinoid segments widely patent. ICA termini patent. A1 segments, anterior communicating artery common anterior cerebral artery widely patent. M1 segments patent without stenosis or occlusion. No proximal M2 occlusion. Distal MCA branches well opacified and symmetric. Posterior circulation: Dominant left vertebral artery widely patent to the vertebrobasilar junction. Hypoplastic right vertebral artery terminates in PICA. Posterior inferior cerebral arteries patent bilaterally. Basilar artery tortuous but widely patent to its distal aspect. Superior cerebral arteries  patent bilaterally. Both of the posterior cerebral artery is predominantly supplied via the basilar artery and are widely patent to their distal aspects. Small right posterior communicating artery noted. Venous sinuses: Patent.  Left transverse sinus  hypoplastic. Anatomic variants: Hypoplastic right vertebral artery terminates in PICA. No other significant anatomic variant. No aneurysm or vascular malformation. Delayed phase: No pathologic enhancement. Review of the MIP images confirms the above findings IMPRESSION: CTA NECK IMPRESSION: 1. Atheromatous plaque about the carotid bifurcations bilaterally with associated stenosis of up to 30% on the right and 40-50% on the left. 2. Aneurysmal dilatation of the ascending aorta up to 4.6 cm. Superimposed penetrating atheromatous ulcer at the aortic arch. 3. Atheromatous narrowing of approximately 40% at the origin of the great vessels. 4. Severe 80% left subclavian artery stenosis as above. CTA HEAD IMPRESSION: 1. Negative CTA for large vessel occlusion. 2. Mild for age plaque at the carotid siphons without flow-limiting stenosis. Otherwise widely patent anterior and posterior circulation. 3. Basilar artery supplied via at the dominant left vertebral artery. Hypoplastic right vertebral artery terminates in PICA. Electronically Signed   By: Jeannine Boga M.D.   On: 06/27/2016 23:55   Mr Brain Wo Contrast (neuro Protocol)  Result Date: 06/27/2016 CLINICAL DATA:  83 year old female with acute onset right arm weakness and numbness upon waking at 0415 hours today. Possible slurred speech at that time. Initial encounter. EXAM: MRI HEAD WITHOUT CONTRAST TECHNIQUE: Multiplanar, multiecho pulse sequences of the brain and surrounding structures were obtained without intravenous contrast. COMPARISON:  Head CT without contrast 1044 hours today and earlier. Brain MRI 08/03/2005 FINDINGS: Brain: Fairly widely scattered small cortical and occasionally white matter foci of restricted diffusion in the posterior left hemisphere including the peri-Rolandic cortex at the right upper extremity representation area (series 4, image 41) the right parietal lobe, as well as the superior and lateral aspect of the left occipital lobe  and left occipital pole. No significant associated T2 or FLAIR hyperintensity at this time. No associated hemorrhage or mass effect. No contralateral right hemisphere, deep gray matter nuclei, or posterior fossa restricted diffusion. No midline shift, mass effect, evidence of mass lesion, ventriculomegaly, extra-axial collection or acute intracranial hemorrhage. Cervicomedullary junction and pituitary are within normal limits. Cerebral volume is mildly decreased since 2007 but remains normal for age. Outside of the acute findings there is scattered mostly chronic nonspecific small foci of cerebral white matter T2 and FLAIR hyperintensity. The left hemisphere is slightly more affected. No chronic cortical encephalomalacia or chronic cerebral blood products are identified. Deep gray matter nuclei, brainstem, and cerebellum remain normal. Vascular: Major intracranial vascular flow voids are stable since the 2007 MRI, including dominant distal left vertebral artery. Skull and upper cervical spine: Negative. Normal bone marrow signal. Sinuses/Orbits: Postoperative changes to both globes since the prior MRI. Otherwise negative orbit soft tissues Visualized paranasal sinuses and mastoids are stable and well pneumatized. Other: Visible internal auditory structures appear normal. Negative scalp soft tissues. IMPRESSION: 1. Widely scattered but small, mostly cortically based, acute infarcts in the posterior Left MCA and the superior Left PCA territories. 2. No associated hemorrhage or mass effect. 3. Otherwise largely stable since 2007 and unremarkable for age noncontrast MRI appearance of the brain. Electronically Signed   By: Genevie Ann M.D.   On: 06/27/2016 14:05        Scheduled Meds: . atorvastatin  20 mg Oral q1800  . clopidogrel  75 mg Oral Daily  . enoxaparin (LOVENOX) injection  40 mg Subcutaneous  Q24H  . fluticasone  2 spray Each Nare Daily  . metoprolol succinate  50 mg Oral Daily  . nicotine  7 mg  Transdermal Daily   Continuous Infusions:   LOS: 0 days    Time spent: 84min   [place], DO [facility] Pager [phone]  If 7PM-7AM, please contact night-coverage [email or web address] Password [email or web address] 06/28/2016, 1:24 PM

## 2016-06-28 NOTE — Evaluation (Signed)
Occupational Therapy Evaluation Patient Details Name: Regina James MRN: [id number] DOB: [birth year] Today's Date: 06/28/2016    History of Present Illness (P) Pt is an 83 y.o. female who presented to the ED with R UE weakness and numbness reporting "I can't control my R arm." She woke up and did not recognize her R arm as her own. MRI revealed widely scattered, small acute infarcts in the posterior L MCA and superior L PCA territories.    Clinical Impression   PTA, pt was independent with ADL and functional mobility. She currently presents with significantly decreased R UE strength and coordination impacting safety and independence with IADL tasks (See below for further details). Educated pt on fine motor coordination HEP with handout provided and she demonstrated understanding. Pt would benefit from continued OT services while admitted to improve independence with ADL and functional mobility. Additionally recommend outpatient OT services post-acute D/C in order to maximize functional use of R UE. OT will continue to follow acutely.    Follow Up Recommendations  Home health OT    Equipment Recommendations  None recommended by OT    Recommendations for Other Services       Precautions / Restrictions Precautions Precautions: (P) Fall Restrictions Weight Bearing Restrictions: (P) No      Mobility Bed Mobility Overal bed mobility: Modified Independent                Transfers Overall transfer level: Needs assistance Equipment used: None Transfers: Sit to/from Stand Sit to Stand: Modified independent (Device/Increase time)              Balance Overall balance assessment: Modified Independent                                         ADL either performed or assessed with clinical judgement   ADL Overall ADL's : Modified independent                                       General ADL Comments: Requires increased time to open  containers and complete fasteners but able to demonstrate independence with compensatory strategies in hospital setting.     Vision Baseline Vision/History: No visual deficits Patient Visual Report: No change from baseline Vision Assessment?: No apparent visual deficits Additional Comments: Able to use vision functionally in both central and peripheral fields.      Perception Perception Perception Tested?: No   Praxis Praxis Praxis tested?: Within functional limits    Pertinent Vitals/Pain Pain Assessment: (P) No/denies pain     Hand Dominance (P) Right   Extremity/Trunk Assessment Upper Extremity Assessment Upper Extremity Assessment: (P) RUE deficits/detail RUE Deficits / Details: Decreased strength grossly 4-/5. Decreased fine and gross motor coordination as well as signs of dysmetria with finger to nose testing.  RUE Coordination: decreased fine motor;decreased gross motor   Lower Extremity Assessment Lower Extremity Assessment: Defer to PT evaluation       Communication Communication Communication: (P) No difficulties   Cognition Arousal/Alertness: (P) Awake/alert Behavior During Therapy: (P) WFL for tasks assessed/performed Overall Cognitive Status: (P) Within Functional Limits for tasks assessed  General Comments       Exercises Exercises: Other exercises Other Exercises Other Exercises: Facilitated improved fine motor coordination with handout provided including composite grasp, digit lifts, thumb circles, digit abduction and adduction. Also educated pt on functional activities to complete in order to improve fine motor coordination at home.    Shoulder Instructions      Home Living Family/patient expects to be discharged to:: (P) Private residence Living Arrangements: (P) Spouse/significant other Available Help at Discharge: (P) Family;Available 24 hours/day Type of Home: (P) House Home Access: (P)  Stairs to enter Entrance Stairs-Number of Steps: (P) 3 to sidewalk, 2 to front door Entrance Stairs-Rails: (P) Right;Left;Can reach both Home Layout: (P) Two level Alternate Level Stairs-Number of Steps: (P) 8, a landing, then 4 more Alternate Level Stairs-Rails: (P) Left Bathroom Shower/Tub: (P) Walk-in shower   Bathroom Toilet: (P) Handicapped height Bathroom Accessibility: (P) Yes   Home Equipment: (P) Shower seat - built in;Cane - single point      Lives With: (P) Spouse    Prior Functioning/Environment Level of Independence: (P) Independent        Comments: (P) pt mentioned that husband is 12 and has some physical difficulties        OT Problem List: Decreased strength;Decreased coordination;Decreased safety awareness;Impaired UE functional use      OT Treatment/Interventions: Self-care/ADL training;Therapeutic exercise;Therapeutic activities;Patient/family education    OT Goals(Current goals can be found in the care plan section) Acute Rehab OT Goals Patient Stated Goal: to have her arm work better. OT Goal Formulation: With patient Time For Goal Achievement: 07/12/16 Potential to Achieve Goals: Good ADL Goals Pt/caregiver will Perform Home Exercise Program: Right Upper extremity;With written HEP provided;Independently (fine motor) Additional ADL Goal #1: Pt will demonstrate improved fine motor coordination to complete a variety of fasteners during dressing tasks.  OT Frequency: Min 2X/week   Barriers to D/C:            Co-evaluation              End of Session Nurse Communication: Mobility status  Activity Tolerance: Patient tolerated treatment well Patient left:  (preparing to ambulate with PT)  OT Visit Diagnosis: Hemiplegia and hemiparesis Hemiplegia - Right/Left: Right Hemiplegia - dominant/non-dominant: Dominant Hemiplegia - caused by: Cerebral infarction                Time:  -    Charges:    G-Codes:     Regina Herrlich, MS OTR/L   Pager: Regina James 06/28/2016, 11:45 AM

## 2016-06-28 NOTE — Progress Notes (Addendum)
Late entry for missed charges as follows for OT evaluation 06/28/16.   06/28/16 1000  OT Time Calculation  OT Start Time (ACUTE ONLY) 0906  OT Stop Time (ACUTE ONLY) 0927  OT Time Calculation (min) 21 min  OT G-codes **NOT FOR INPATIENT CLASS**  Functional Assessment Tool Used AM-PAC 6 Clicks Daily Activity  Functional Limitation Self care  Self Care Current Status (Y1856) [facility]  Self Care Goal Status (D1497) [place]  OT General Charges  $OT Visit 1 Procedure  OT Evaluation  $OT Eval Moderate Complexity 1 Procedure   Thank you!  Norman Herrlich, MS OTR/L  Pager: [phone]

## 2016-06-28 NOTE — Evaluation (Signed)
Physical Therapy Evaluation Patient Details Name: Regina James MRN: [id number] DOB: [birth year] Today's Date: 06/28/2016   History of Present Illness  Pt is an 83 y.o. female who presented to the ED with R UE weakness and numbness reporting "I can't control my R arm." She woke up and did not recognize her R arm as her own. MRI revealed widely scattered, small acute infarcts in the posterior L MCA and superior L PCA territories.   Clinical Impression  Patient evaluated by Physical Therapy with no further acute PT needs identified. All education has been completed and the patient has no further questions. At the time of PT eval,main deficits were noted in the RUE. Pt able to mobilize at a gross modified independent level with minimal effects in RLE noted. Pt is appropriate for follow up at the outpatient level upon d/c. See below for any follow-up Physical Therapy or equipment needs. PT is signing off. Thank you for this referral.     Follow Up Recommendations Outpatient PT;Supervision - Intermittent    Equipment Recommendations  None recommended by PT    Recommendations for Other Services       Precautions / Restrictions Precautions Precautions: Fall Restrictions Weight Bearing Restrictions: No      Mobility  Bed Mobility Overal bed mobility: Modified Independent             General bed mobility comments: pt sitting at EOB at start  Transfers Overall transfer level: Modified independent Equipment used: None Transfers: Sit to/from Stand Sit to Stand: Independent;Modified independent (Device/Increase time)         General transfer comment: pt stood up from bed independent but Mod I for stand to sit due to need for VC's for hand placement and safety.  Ambulation/Gait Ambulation/Gait assistance: Min guard Ambulation Distance (Feet): 300 Feet Assistive device: None Gait Pattern/deviations: Step-through pattern;Decreased step length - right;Decreased stride  length;Narrow base of support Gait velocity: normal Gait velocity interpretation: >2.62 ft/sec, indicative of independent community ambulator General Gait Details: R LE had increased step height, possibly indicating altered proprioception of R foot.  Gait deviations are mild and pt was overall steady and safe with ambulation.  Stairs Stairs: Yes Stairs assistance: Supervision Stair Management: One rail Left;Alternating pattern Number of Stairs: 5 General stair comments: light supervision.  pt demonstrated steady alternating step pattern on stairs with ease.  Wheelchair Mobility    Modified Rankin (Stroke Patients Only) Modified Rankin (Stroke Patients Only) Pre-Morbid Rankin Score: No symptoms Modified Rankin: Slight disability     Balance Overall balance assessment: No apparent balance deficits (not formally assessed)                           High level balance activites: Direction changes;Turns;Head turns;Other (comment) (Stepping over objects, stepping around objects)               Pertinent Vitals/Pain Pain Assessment: No/denies pain    Home Living Family/patient expects to be discharged to:: Private residence Living Arrangements: Spouse/significant other Available Help at Discharge: Family;Available 24 hours/day Type of Home: House Home Access: Stairs to enter Entrance Stairs-Rails: Right;Left;Can reach both Entrance Stairs-Number of Steps: 3 to sidewalk, 2 to front door Home Layout: Two level Home Equipment: Shower seat - built in;Cane - single point      Prior Function Level of Independence: Independent         Comments: pt mentioned that husband is 58 and has some physical difficulties  Hand Dominance   Dominant Hand: Right    Extremity/Trunk Assessment   Upper Extremity Assessment Upper Extremity Assessment: RUE deficits/detail RUE Deficits / Details: Decreased strength grossly 4-/5. Decreased fine and gross motor coordination  as well as signs of dysmetria with finger to nose testing.  RUE Coordination: decreased fine motor;decreased gross motor    Lower Extremity Assessment Lower Extremity Assessment: RLE deficits/detail RLE Deficits / Details: mild gait deviations noted, suggesting possible proprioceptive deficits RLE Sensation:  (pt reports no sensation changes) RLE Coordination: decreased gross motor (heel to shin significantly slower than L side)    Cervical / Trunk Assessment Cervical / Trunk Assessment: Kyphotic;Other exceptions Cervical / Trunk Exceptions: forward head and rounded shoulders  Communication   Communication: No difficulties  Cognition Arousal/Alertness: Awake/alert Behavior During Therapy: WFL for tasks assessed/performed Overall Cognitive Status: Within Functional Limits for tasks assessed                                        General Comments      Exercises Other Exercises Other Exercises: Facilitated improved fine motor coordination with handout provided including composite grasp, digit lifts, thumb circles, digit abduction and adduction. Also educated pt on functional activities to complete in order to improve fine motor coordination at home.    Assessment/Plan    PT Assessment Patent does not need any further PT services  PT Problem List         PT Treatment Interventions      PT Goals (Current goals can be found in the Care Plan section)  Acute Rehab PT Goals Patient Stated Goal: to have her arm work better. PT Goal Formulation: All assessment and education complete, DC therapy    Frequency     Barriers to discharge        Co-evaluation               End of Session Equipment Utilized During Treatment: Gait belt Activity Tolerance: Patient tolerated treatment well Patient left: in chair;with call bell/phone within reach;with family/visitor present Nurse Communication: Mobility status PT Visit Diagnosis: Other symptoms and signs  involving the nervous system (R29.898)    Time: 5465-6812 PT Time Calculation (min) (ACUTE ONLY): 33 min   Charges:   PT Evaluation $PT Eval Moderate Complexity: 1 Procedure PT Treatments $Gait Training: 8-22 mins   PT G Codes:   PT G-Codes **NOT FOR INPATIENT CLASS** Functional Assessment Tool Used: AM-PAC 6 Clicks Basic Mobility;Clinical judgement Functional Limitation: Mobility: Walking and moving around Mobility: Walking and Moving Around Current Status (X5170): At least 1 percent but less than 20 percent impaired, limited or restricted Mobility: Walking and Moving Around Goal Status [phone]): At least 1 percent but less than 20 percent impaired, limited or restricted Mobility: Walking and Moving Around Discharge Status [phone]): At least 1 percent but less than 20 percent impaired, limited or restricted    Rolinda Roan, PT, DPT Acute Rehabilitation Services Pager: [place] 06/28/2016, 12:30 PM

## 2016-06-29 LAB — HEMOGLOBIN A1C
Hgb A1c MFr Bld: 5.6 % (ref 4.8–5.6)
Mean Plasma Glucose: 114 mg/dL

## 2016-06-29 MED ORDER — ATORVASTATIN CALCIUM 20 MG PO TABS: 20.0000 mg | ORAL_TABLET | Freq: Every day | ORAL | 0 refills | Status: DC

## 2016-06-29 MED ORDER — CLOPIDOGREL BISULFATE 75 MG PO TABS: 75.0000 mg | ORAL_TABLET | Freq: Every day | ORAL | 0 refills | Status: DC

## 2016-06-29 NOTE — Consult Note (Signed)
ELECTROPHYSIOLOGY CONSULT NOTE    Patient ID: BRUCE MAYERS MRN: [id number], DOB/AGE: Feb 01, 1934 83 y.o.  Admit date: 06/27/2016 Date of Consult: 06/29/2016   Primary Physician: Tawanna Solo, MD Primary Cardiologist: Dr. Ellyn Hack Requesting MD: Dr. Ellyn Hack  Reason for Consultation: evaluate for ILR  HPI: DEETTE REVAK is a 83 y.o. female with PMHx of SVT historically f/u with Dr. Mare Ferrari, most recently Dr. Ellyn Hack, there is no mention of any known AFib in the notes, but appears on the problem list Oct 2016, otherwise no known cardiac history, is + hx of GERD, ongoing smoker.   She denies any CP or SOB, no ongoing or recent c/o palpitations, no dizziness, near syncope or syncope.  She was admitted to [facility] 06/27/16 with R sided weakness, dx with CVA.  Cardiology was consulted regarding brief irregular rhythm on telemetry, noted to be a very brief AT.  EP is being asked to see for possible ILR, with planned out patient TEE to complete her w/u.     Past Medical History:  Diagnosis Date  . Cancer ([place])    basal cell carcinoma  . History of PSVT (paroxysmal supraventricular tachycardia)    Reported back as far as 2010.  Marland Kitchen Palpitations      Surgical History:  Past Surgical History:  Procedure Laterality Date  . APPENDECTOMY    . CHOLECYSTECTOMY    . ROTATOR CUFF REPAIR    . US ECHOCARDIOGRAPHY  01/09/2009   EF 55-60%  . WRIST FRACTURE SURGERY       Prescriptions Prior to Admission  Medication Sig Dispense Refill Last Dose  . aspirin 81 MG tablet Take 81 mg by mouth daily.   06/26/2016 at Unknown time  . Doxylamine Succinate, Sleep, (SLEEP AID PO) Take 1 tablet by mouth at bedtime.    06/26/2016 at Unknown time  . metoprolol succinate (TOPROL-XL) 100 MG 24 hr tablet Take  0.5 tablet ( 50 mg total) once daily 45 tablet 3 06/26/2016 at 0800  . Multiple Vitamin (MULTIVITAMIN) tablet Take 1 tablet by mouth daily.     06/26/2016 at Unknown time  . Polyethyl Glycol-Propyl Glycol  (SYSTANE OP) Place 1 drop into both eyes daily as needed. For dry eyes   06/26/2016 at Unknown time    Inpatient Medications:  . atorvastatin  20 mg Oral q1800  . clopidogrel  75 mg Oral Daily  . enoxaparin (LOVENOX) injection  40 mg Subcutaneous Q24H  . fluticasone  2 spray Each Nare Daily  . metoprolol succinate  50 mg Oral Daily  . nicotine  7 mg Transdermal Daily    Allergies:  Allergies  Allergen Reactions  . Morphine And Related Swelling  . Levofloxacin Anxiety    Double vision    Social History   Social History  . Marital status: Married    Spouse name: N/A  . Number of children: N/A  . Years of education: N/A   Occupational History  . Not on file.   Social History Main Topics  . Smoking status: Current Every Day Smoker    Packs/day: 0.25    Years: 45.00    Types: Cigarettes  . Smokeless tobacco: Never Used  . Alcohol use 4.2 oz/week    7 Glasses of wine per week  . Drug use: No  . Sexual activity: Not on file   Other Topics Concern  . Not on file   Social History Narrative  . No narrative on file     Family History  Problem Relation Age of Onset  . Cancer Father   . Diabetes Father   . Varicose Veins Mother   . Cardiomyopathy Mother   . Kidney cancer Brother   . Arrhythmia Brother      Review of Systems: All other systems reviewed and are otherwise negative except as noted above.  Physical Exam: Vitals:   06/28/16 2121 06/29/16 0021 06/29/16 0602 06/29/16 0942  BP: (!) 142/82  (!) 136/48 (!) 157/74  Pulse: 62 65 63 65  Resp: 16 16 16 16   Temp: 98.4 F (36.9 C) 98 F (36.7 C) 97.9 F (36.6 C) 98.4 F (36.9 C)  TempSrc: Oral Oral Oral Oral  SpO2: 98%  97% 99%  Weight:      Height:        GEN- The patient is well appearing, alert and oriented x 3 today.   HEENT: normocephalic, atraumatic; sclera clear, conjunctiva pink; hearing intact; oropharynx clear; neck supple, no JVP Lymph- no cervical lymphadenopathy Lungs- CTA b/l, normal  work of breathing.  No wheezes, rales, rhonchi Heart- RRR, no murmurs, rubs or gallops, PMI not laterally displaced GI- soft, non-tender, non-distended Extremities- no clubbing, cyanosis, or edema MS- no significant deformity or atrophy Skin- warm and dry, no rash or lesion Psych- euthymic mood, full affect Neuro- patient reports RUE weakness is improving  Labs:   Lab Results  Component Value Date   WBC 8.5 06/27/2016   HGB 15.3 (H) 06/27/2016   HCT 45.3 06/27/2016   MCV 96.6 06/27/2016   PLT 286 06/27/2016    Recent Labs Lab 06/27/16 1059  NA 142  K 4.3  CL 108  CO2 23  BUN 11  CREATININE 0.87  CALCIUM 9.1  GLUCOSE 96      Radiology/Studies:  Ct Angio Head W Or Wo Contrast Result Date: 06/27/2016 CLINICAL DATA:  Initial evaluation for acute stroke.  The EXAM: CT ANGIOGRAPHY HEAD AND NECK TECHNIQUE: Multidetector CT imaging of the head and neck was performed using the standard protocol during bolus administration of intravenous contrast. Multiplanar CT image reconstructions and MIPs were obtained to evaluate the vascular anatomy. Carotid stenosis measurements (when applicable) are obtained utilizing NASCET criteria, using the distal internal carotid diameter as the denominator. CONTRAST:  50 cc of Isovue 370. COMPARISON:  Prior MRI and CT from earlier the same day. FINDINGS: CTA NECK FINDINGS Aortic arch: Aneurysmal dilatation of the ascending aorta up to 4.6 cm in maximal diameter. Superimposed penetrating atheromatous ulcer at the undersurface of the aortic arch without significant intraluminal thrombus (series 7, image 337). Normal 3 branch morphology. 40% atheromatous narrowing at the origin of the left common carotid artery (series 8, image 103). Approximately 30-40% stenoses at the origin of the subclavian arteries as well. Multifocal irregularity seen throughout the visualized subclavian arteries. There is a long segment stenosis of approximately 80% within knee distal left  subclavian artery (series 8, image 121). Right carotid system: Right common carotid artery patent from its origin to the bifurcation without flow-limiting stenosis. Multifocal atheromatous irregularity about the right bifurcation/ proximal right ICA with mild narrowing of up to 30% by NASCET criteria. Right ICA irregular but patent distally to the skullbase without flow-limiting stenosis. Left carotid system: 40% atheromatous narrowing at the origin the left common carotid artery. Scattered eccentric noncalcified plaque within the left common carotid artery without flow-limiting stenosis. Calcified noncalcified plaque about the left bifurcation/ proximal left ICA. Associated narrowing of up to 40-50% by NASCET criteria (series 7, image 216). Left ICA  widely patent distally to the skullbase. Vertebral arteries: Both of the vertebral arteries arise from the subclavian arteries. Left vertebral artery dominant. Vertebral arteries widely patent within the neck without stenosis or occlusion. Skeleton: Moderate degenerative spondylolysis present at C4-5 through C6-7. Multilevel facet arthrosis. No acute osseus abnormality. No worrisome lytic or blastic osseous lesions. Other neck: No acute soft tissue abnormality within the neck. No adenopathy. Asymmetric enlargement of the right thyroid gland with hypodense nodules measure up to 1 cm, likely related to goiter. Upper chest: Visualized upper mediastinum within normal limits. Scattered atelectatic changes present within the lungs. Visualized lungs are otherwise clear. Review of the MIP images confirms the above findings CTA HEAD FINDINGS Anterior circulation: Petrous segments widely patent bilaterally. Minimal scattered plaque within the cavernous ICAs without flow-limiting stenosis. Supraclinoid segments widely patent. ICA termini patent. A1 segments, anterior communicating artery common anterior cerebral artery widely patent. M1 segments patent without stenosis or  occlusion. No proximal M2 occlusion. Distal MCA branches well opacified and symmetric. Posterior circulation: Dominant left vertebral artery widely patent to the vertebrobasilar junction. Hypoplastic right vertebral artery terminates in PICA. Posterior inferior cerebral arteries patent bilaterally. Basilar artery tortuous but widely patent to its distal aspect. Superior cerebral arteries patent bilaterally. Both of the posterior cerebral artery is predominantly supplied via the basilar artery and are widely patent to their distal aspects. Small right posterior communicating artery noted. Venous sinuses: Patent.  Left transverse sinus hypoplastic. Anatomic variants: Hypoplastic right vertebral artery terminates in PICA. No other significant anatomic variant. No aneurysm or vascular malformation. Delayed phase: No pathologic enhancement. Review of the MIP images confirms the above findings IMPRESSION: CTA NECK IMPRESSION: 1. Atheromatous plaque about the carotid bifurcations bilaterally with associated stenosis of up to 30% on the right and 40-50% on the left. 2. Aneurysmal dilatation of the ascending aorta up to 4.6 cm. Superimposed penetrating atheromatous ulcer at the aortic arch. 3. Atheromatous narrowing of approximately 40% at the origin of the great vessels. 4. Severe 80% left subclavian artery stenosis as above. CTA HEAD IMPRESSION: 1. Negative CTA for large vessel occlusion. 2. Mild for age plaque at the carotid siphons without flow-limiting stenosis. Otherwise widely patent anterior and posterior circulation. 3. Basilar artery supplied via at the dominant left vertebral artery. Hypoplastic right vertebral artery terminates in PICA. Electronically Signed   By: Jeannine Boga M.D.   On: 06/27/2016 23:55   Ct Head Wo Contrast Result Date: 06/27/2016 CLINICAL DATA:  Right upper extremity weakness EXAM: CT HEAD WITHOUT CONTRAST TECHNIQUE: Contiguous axial images were obtained from the base of the skull  through the vertex without intravenous contrast. COMPARISON:  March 23, 2007 FINDINGS: Brain: The ventricles are normal in size and configuration. There is no intracranial mass, hemorrhage, extra-axial fluid collection, or midline shift. There is slight small vessel disease in the centra semiovale bilaterally. Elsewhere gray-white compartments appear normal. No evident acute infarct. Vascular: No hyperdense vessel. There is calcification in each carotid siphon region. Skull: The bony calvarium appears intact. Sinuses/Orbits: There is mucosal thickening in multiple ethmoid air cells bilaterally. Opacification is greatest in several anterior right-sided ethmoid air cells. Visualized paranasal sinuses otherwise are clear. There is leftward deviation of the nasal septum. Orbits appear symmetric bilaterally. Other: Mastoid air cells are clear. IMPRESSION: Slight periventricular small vessel disease. No intracranial mass, hemorrhage, or extra-axial fluid collection. No acute infarct evident. Foci of vascular calcification noted. Areas of ethmoid sinus disease noted, more severe on the right than on the left. There is  leftward deviation of the nasal septum. Electronically Signed   By: Lowella Grip III M.D.   On: 06/27/2016 10:56    Mr Brain Wo Contrast (neuro Protocol) Result Date: 06/27/2016 CLINICAL DATA:  83 year old female with acute onset right arm weakness and numbness upon waking at 0415 hours today. Possible slurred speech at that time. Initial encounter. EXAM: MRI HEAD WITHOUT CONTRAST TECHNIQUE: Multiplanar, multiecho pulse sequences of the brain and surrounding structures were obtained without intravenous contrast. COMPARISON:  Head CT without contrast 1044 hours today and earlier. Brain MRI 08/03/2005 FINDINGS: Brain: Fairly widely scattered small cortical and occasionally white matter foci of restricted diffusion in the posterior left hemisphere including the peri-Rolandic cortex at the right upper  extremity representation area (series 4, image 41) the right parietal lobe, as well as the superior and lateral aspect of the left occipital lobe and left occipital pole. No significant associated T2 or FLAIR hyperintensity at this time. No associated hemorrhage or mass effect. No contralateral right hemisphere, deep gray matter nuclei, or posterior fossa restricted diffusion. No midline shift, mass effect, evidence of mass lesion, ventriculomegaly, extra-axial collection or acute intracranial hemorrhage. Cervicomedullary junction and pituitary are within normal limits. Cerebral volume is mildly decreased since 2007 but remains normal for age. Outside of the acute findings there is scattered mostly chronic nonspecific small foci of cerebral white matter T2 and FLAIR hyperintensity. The left hemisphere is slightly more affected. No chronic cortical encephalomalacia or chronic cerebral blood products are identified. Deep gray matter nuclei, brainstem, and cerebellum remain normal. Vascular: Major intracranial vascular flow voids are stable since the 2007 MRI, including dominant distal left vertebral artery. Skull and upper cervical spine: Negative. Normal bone marrow signal. Sinuses/Orbits: Postoperative changes to both globes since the prior MRI. Otherwise negative orbit soft tissues Visualized paranasal sinuses and mastoids are stable and well pneumatized. Other: Visible internal auditory structures appear normal. Negative scalp soft tissues. IMPRESSION: 1. Widely scattered but small, mostly cortically based, acute infarcts in the posterior Left MCA and the superior Left PCA territories. 2. No associated hemorrhage or mass effect. 3. Otherwise largely stable since 2007 and unremarkable for age noncontrast MRI appearance of the brain. Electronically Signed   By: Genevie Ann M.D.   On: 06/27/2016 14:05   Reviewed by myself  EKG: all EKGs in the EPIC system are reviewed and SR only TELEMETRY: SR, occ PAC's, PVC's, rare  very brief 4-8 beats of PAT only are observed  06/28/16: TTE Study Conclusions - Left ventricle: The cavity size was normal. There was mild   concentric hypertrophy. Systolic function was normal. The   estimated ejection fraction was in the range of 55% to 60%. Wall   motion was normal; there were no regional wall motion   abnormalities. Doppler parameters are consistent with abnormal   left ventricular relaxation (grade 1 diastolic dysfunction).   Doppler parameters are consistent with high ventricular filling   pressure. - Aortic valve: Transvalvular velocity was within the normal range.   There was no stenosis. There was no regurgitation. - Mitral valve: Transvalvular velocity was within the normal range.   There was no evidence for stenosis. There was no regurgitation. - Left atrium: The atrium was moderately dilated. - Right ventricle: The cavity size was normal. Wall thickness was   normal. Systolic function was normal. - Atrial septum: No defect or patent foramen ovale was identified. - Tricuspid valve: There was mild regurgitation. - Pulmonary arteries: Systolic pressure was within the normal  range. PA peak pressure: 30 mm Hg (S).    Assessment and Plan:   1. CVA Neuro noted: Scattered L MCA infarcts, embolic secondary to unclear source. Pt does have multiple large vessel vasculopathy, including left ICA, left subclavian A, aortic atheroma. However, pt also has paroxysmal SVT concerning for pAfib. Aortic aneurysm is also a potential embolic source.recommend cardiology consultation to consider TEE/loop recorder vs. Anticoagulation.  Cardiology is following the patient with plans for out patient TEE and have asked for EP service to evaluate telemetry and consider loop implant Dr. Curt Bears and myself have also reviewed telemetry, note a very brief PAT yesterday 4 and then 8 beats of PAT, no evidence of AF is seen.  Cardiology plans for out patient TEE, we have discussed with  the patient monitoring for potential AFib going forward with either an event monitor or ILR.  Risks/benefits of ILR procedure have been discussed with the patient.  At this time she is clear she would prefer to pursue the ILR should her TEE not be revealing as to the etiology of her stroke.  I Chalet Kerwin ask that EP service be notified of her when her TEE scheduled and can plan for implant if negative.   SignedTommye Standard, PA-C 06/29/2016 10:07 AM  I have seen and examined this patient with Tommye Standard.  Agree with above, note added to reflect my findings.  On exam, regular rhythm, no murmurs, lungs clear. Had CVA with right arm weakness which has been improving. Plan for discharge from the hospital with follow up TEE. On telemetry, had short runs of likely AT. Discussed LINQ for further monitoring of atrial fibrillation. Risks and benefits explained. Risks include but not limited to bleeding, infection. The patient understands the risks and has agreed to the procedure.     Nakyiah Kuck M. Kalayna Noy MD 06/29/2016 12:50 PM

## 2016-06-29 NOTE — Discharge Summary (Addendum)
Physician Discharge Summary  Regina James:[id number] DOB: [birth year] DOA: 06/27/2016  PCP: Tawanna Solo, MD  Admit date: 06/27/2016 Discharge date: 06/29/2016   Recommendations for Outpatient Follow-Up:   1. Outpatient vascular surgery referral for: Left subclavian A stenosis 2. Cardiology to follow: Aneurysmal dilatation of ascending aorta 4.6 cm. Penetrating atheromatous ulcer at the aortic arch 3. Outpatient TEE/loop recorder 4. Added statin and plavix- follow LFTs/FLP   Discharge Diagnosis:   Principal Problem:   Right arm weakness Active Problems:   Tobacco abuse   Paroxysmal SVT (supraventricular tachycardia) (HCC)   Right arm numbness   Deviated septum   Disorder of ethmoidal sinus   Acute ischemic stroke [facility])   Dysarthria   Discharge disposition:  Home.  Discharge Condition: Improved.  Diet recommendation: Low sodium, heart healthy  Wound care: None.   History of Present Illness:   Regina James is a 83 y.o. female with medical history significant of SVT, cataracts, basal cell carcinoma. This morning around 4:15 AM, she reports waking up and feeling something beside her. She did not know what it was and got up to turn on the light. She realized at that time that it was her arm. She had numbness and weakness of her right arm which concerned her for a stroke. She took two full strength aspirin and, over a few minutes, her strength improved slightly but numbness continued. She went back to sleep, and woke up around 9:00 AM with persistent weakness and numbness of her right arm. Her daughter felt that she may have had some slurred speech at the time as well. No facial droop or lower extremity weakness.   Hospital Course by Problem:   Stroke:   Scattered L MCA infarcts, embolic secondary to unclear source.   Resultant  R hand weakness, pronator drift  CT head small vessel disease. No acute infarct. Ethmoid sinus diease R >L. Leftward deviation of  septum  CTA head no LVO. ICA plaque w/o flow limiting stenosis.   CTA neck L 40-50% stenosis. Aneurysmal dilatation of ascending aorta 4.6 cm. Penetrating atheromatous ulcer at the aortic arch. L SCA 80% stenosis distal to [facility] origin  MRI  scattered L MCA territory infarcts  2D Echo EF 55-60%  LE venous doppler no DVT  LDL 95- statin added  HgbA1c 5.6  Continue plavix on discharge.   Paroxysmal SVT Palpitations - PACs and PVCs  Followed by Dr. Mare Ferrari and Dr. Ellyn Hack  No mention of atrial fibrillation or hx of atrial fibrillation on the record  On ASA at home, now on plavix   outpt TEE/loop recorder.        Aortic arch aneurysm  CTA showed aortic arch aneurysm 4.6cm  Penetrating atheromatous ulcer at aortic arch  Could be potential embolic source of stroke  Pending outpt TEE and cardiology recs after that- follow up 1 year  Left subclavian A stenosis  80% stenosis distal to [facility] origin  Asymptomatic  Recommend outpt follow up with [facility].  Hypertension  On toprol XL 100 at home  Stable  Permissive hypertension (OK if < 220/120) but gradually normalize in 5-7 days  Long-term BP goal normotensive  Hyperlipidemia  Home meds:  No statin  LDL 95, goal < 70  Add lipitor 20mg   Continue statin at discharge  Tobacco abuse  Patient not willing to quit  Alcohol use -patient counseled to have 1 drink/day  Deviated septum Seen incidentally on CT scan. Left deviation -Outpatient management -intranasal steroids  Medical  Consultants:    EP  Cardiology  neurology   Discharge Exam:   Vitals:   06/29/16 0602 06/29/16 0942  BP: (!) 136/48 (!) 157/74  Pulse: 63 65  Resp: 16 16  Temp: 97.9 F (36.6 C) 98.4 F (36.9 C)   Vitals:   06/28/16 2121 06/29/16 0021 06/29/16 0602 06/29/16 0942  BP: (!) 142/82  (!) 136/48 (!) 157/74  Pulse: 62 65 63 65  Resp: 16 16 16 16   Temp: 98.4 F (36.9 C) 98 F (36.7 C) 97.9 F (36.6 C) 98.4 F (36.9  C)  TempSrc: Oral Oral Oral Oral  SpO2: 98%  97% 99%  Weight:      Height:        Gen:  NAD    The results of significant diagnostics from this hospitalization (including imaging, microbiology, ancillary and laboratory) are listed below for reference.     Procedures and Diagnostic Studies:   Ct Angio Head W Or Wo Contrast  Result Date: 06/27/2016 CLINICAL DATA:  Initial evaluation for acute stroke.  The EXAM: CT ANGIOGRAPHY HEAD AND NECK TECHNIQUE: Multidetector CT imaging of the head and neck was performed using the standard protocol during bolus administration of intravenous contrast. Multiplanar CT image reconstructions and MIPs were obtained to evaluate the vascular anatomy. Carotid stenosis measurements (when applicable) are obtained utilizing NASCET criteria, using the distal internal carotid diameter as the denominator. CONTRAST:  50 cc of Isovue 370. COMPARISON:  Prior MRI and CT from earlier the same day. FINDINGS: CTA NECK FINDINGS Aortic arch: Aneurysmal dilatation of the ascending aorta up to 4.6 cm in maximal diameter. Superimposed penetrating atheromatous ulcer at the undersurface of the aortic arch without significant intraluminal thrombus (series 7, image 337). Normal 3 branch morphology. 40% atheromatous narrowing at the origin of the left common carotid artery (series 8, image 103). Approximately 30-40% stenoses at the origin of the subclavian arteries as well. Multifocal irregularity seen throughout the visualized subclavian arteries. There is a long segment stenosis of approximately 80% within knee distal left subclavian artery (series 8, image 121). Right carotid system: Right common carotid artery patent from its origin to the bifurcation without flow-limiting stenosis. Multifocal atheromatous irregularity about the right bifurcation/ proximal right ICA with mild narrowing of up to 30% by NASCET criteria. Right ICA irregular but patent distally to the skullbase without  flow-limiting stenosis. Left carotid system: 40% atheromatous narrowing at the origin the left common carotid artery. Scattered eccentric noncalcified plaque within the left common carotid artery without flow-limiting stenosis. Calcified noncalcified plaque about the left bifurcation/ proximal left ICA. Associated narrowing of up to 40-50% by NASCET criteria (series 7, image 216). Left ICA widely patent distally to the skullbase. Vertebral arteries: Both of the vertebral arteries arise from the subclavian arteries. Left vertebral artery dominant. Vertebral arteries widely patent within the neck without stenosis or occlusion. Skeleton: Moderate degenerative spondylolysis present at C4-5 through C6-7. Multilevel facet arthrosis. No acute osseus abnormality. No worrisome lytic or blastic osseous lesions. Other neck: No acute soft tissue abnormality within the neck. No adenopathy. Asymmetric enlargement of the right thyroid gland with hypodense nodules measure up to 1 cm, likely related to goiter. Upper chest: Visualized upper mediastinum within normal limits. Scattered atelectatic changes present within the lungs. Visualized lungs are otherwise clear. Review of the MIP images confirms the above findings CTA HEAD FINDINGS Anterior circulation: Petrous segments widely patent bilaterally. Minimal scattered plaque within the cavernous ICAs without flow-limiting stenosis. Supraclinoid segments widely patent. ICA termini  patent. A1 segments, anterior communicating artery common anterior cerebral artery widely patent. M1 segments patent without stenosis or occlusion. No proximal M2 occlusion. Distal MCA branches well opacified and symmetric. Posterior circulation: Dominant left vertebral artery widely patent to the vertebrobasilar junction. Hypoplastic right vertebral artery terminates in PICA. Posterior inferior cerebral arteries patent bilaterally. Basilar artery tortuous but widely patent to its distal aspect. Superior  cerebral arteries patent bilaterally. Both of the posterior cerebral artery is predominantly supplied via the basilar artery and are widely patent to their distal aspects. Small right posterior communicating artery noted. Venous sinuses: Patent.  Left transverse sinus hypoplastic. Anatomic variants: Hypoplastic right vertebral artery terminates in PICA. No other significant anatomic variant. No aneurysm or vascular malformation. Delayed phase: No pathologic enhancement. Review of the MIP images confirms the above findings IMPRESSION: CTA NECK IMPRESSION: 1. Atheromatous plaque about the carotid bifurcations bilaterally with associated stenosis of up to 30% on the right and 40-50% on the left. 2. Aneurysmal dilatation of the ascending aorta up to 4.6 cm. Superimposed penetrating atheromatous ulcer at the aortic arch. 3. Atheromatous narrowing of approximately 40% at the origin of the great vessels. 4. Severe 80% left subclavian artery stenosis as above. CTA HEAD IMPRESSION: 1. Negative CTA for large vessel occlusion. 2. Mild for age plaque at the carotid siphons without flow-limiting stenosis. Otherwise widely patent anterior and posterior circulation. 3. Basilar artery supplied via at the dominant left vertebral artery. Hypoplastic right vertebral artery terminates in PICA. Electronically Signed   By: Jeannine Boga M.D.   On: 06/27/2016 23:55   Ct Head Wo Contrast  Result Date: 06/27/2016 CLINICAL DATA:  Right upper extremity weakness EXAM: CT HEAD WITHOUT CONTRAST TECHNIQUE: Contiguous axial images were obtained from the base of the skull through the vertex without intravenous contrast. COMPARISON:  March 23, 2007 FINDINGS: Brain: The ventricles are normal in size and configuration. There is no intracranial mass, hemorrhage, extra-axial fluid collection, or midline shift. There is slight small vessel disease in the centra semiovale bilaterally. Elsewhere gray-white compartments appear normal. No  evident acute infarct. Vascular: No hyperdense vessel. There is calcification in each carotid siphon region. Skull: The bony calvarium appears intact. Sinuses/Orbits: There is mucosal thickening in multiple ethmoid air cells bilaterally. Opacification is greatest in several anterior right-sided ethmoid air cells. Visualized paranasal sinuses otherwise are clear. There is leftward deviation of the nasal septum. Orbits appear symmetric bilaterally. Other: Mastoid air cells are clear. IMPRESSION: Slight periventricular small vessel disease. No intracranial mass, hemorrhage, or extra-axial fluid collection. No acute infarct evident. Foci of vascular calcification noted. Areas of ethmoid sinus disease noted, more severe on the right than on the left. There is leftward deviation of the nasal septum. Electronically Signed   By: Lowella Grip III M.D.   On: 06/27/2016 10:56   Ct Angio Neck W Or Wo Contrast  Result Date: 06/27/2016 CLINICAL DATA:  Initial evaluation for acute stroke.  The EXAM: CT ANGIOGRAPHY HEAD AND NECK TECHNIQUE: Multidetector CT imaging of the head and neck was performed using the standard protocol during bolus administration of intravenous contrast. Multiplanar CT image reconstructions and MIPs were obtained to evaluate the vascular anatomy. Carotid stenosis measurements (when applicable) are obtained utilizing NASCET criteria, using the distal internal carotid diameter as the denominator. CONTRAST:  50 cc of Isovue 370. COMPARISON:  Prior MRI and CT from earlier the same day. FINDINGS: CTA NECK FINDINGS Aortic arch: Aneurysmal dilatation of the ascending aorta up to 4.6 cm in maximal diameter. Superimposed  penetrating atheromatous ulcer at the undersurface of the aortic arch without significant intraluminal thrombus (series 7, image 337). Normal 3 branch morphology. 40% atheromatous narrowing at the origin of the left common carotid artery (series 8, image 103). Approximately 30-40% stenoses  at the origin of the subclavian arteries as well. Multifocal irregularity seen throughout the visualized subclavian arteries. There is a long segment stenosis of approximately 80% within knee distal left subclavian artery (series 8, image 121). Right carotid system: Right common carotid artery patent from its origin to the bifurcation without flow-limiting stenosis. Multifocal atheromatous irregularity about the right bifurcation/ proximal right ICA with mild narrowing of up to 30% by NASCET criteria. Right ICA irregular but patent distally to the skullbase without flow-limiting stenosis. Left carotid system: 40% atheromatous narrowing at the origin the left common carotid artery. Scattered eccentric noncalcified plaque within the left common carotid artery without flow-limiting stenosis. Calcified noncalcified plaque about the left bifurcation/ proximal left ICA. Associated narrowing of up to 40-50% by NASCET criteria (series 7, image 216). Left ICA widely patent distally to the skullbase. Vertebral arteries: Both of the vertebral arteries arise from the subclavian arteries. Left vertebral artery dominant. Vertebral arteries widely patent within the neck without stenosis or occlusion. Skeleton: Moderate degenerative spondylolysis present at C4-5 through C6-7. Multilevel facet arthrosis. No acute osseus abnormality. No worrisome lytic or blastic osseous lesions. Other neck: No acute soft tissue abnormality within the neck. No adenopathy. Asymmetric enlargement of the right thyroid gland with hypodense nodules measure up to 1 cm, likely related to goiter. Upper chest: Visualized upper mediastinum within normal limits. Scattered atelectatic changes present within the lungs. Visualized lungs are otherwise clear. Review of the MIP images confirms the above findings CTA HEAD FINDINGS Anterior circulation: Petrous segments widely patent bilaterally. Minimal scattered plaque within the cavernous ICAs without flow-limiting  stenosis. Supraclinoid segments widely patent. ICA termini patent. A1 segments, anterior communicating artery common anterior cerebral artery widely patent. M1 segments patent without stenosis or occlusion. No proximal M2 occlusion. Distal MCA branches well opacified and symmetric. Posterior circulation: Dominant left vertebral artery widely patent to the vertebrobasilar junction. Hypoplastic right vertebral artery terminates in PICA. Posterior inferior cerebral arteries patent bilaterally. Basilar artery tortuous but widely patent to its distal aspect. Superior cerebral arteries patent bilaterally. Both of the posterior cerebral artery is predominantly supplied via the basilar artery and are widely patent to their distal aspects. Small right posterior communicating artery noted. Venous sinuses: Patent.  Left transverse sinus hypoplastic. Anatomic variants: Hypoplastic right vertebral artery terminates in PICA. No other significant anatomic variant. No aneurysm or vascular malformation. Delayed phase: No pathologic enhancement. Review of the MIP images confirms the above findings IMPRESSION: CTA NECK IMPRESSION: 1. Atheromatous plaque about the carotid bifurcations bilaterally with associated stenosis of up to 30% on the right and 40-50% on the left. 2. Aneurysmal dilatation of the ascending aorta up to 4.6 cm. Superimposed penetrating atheromatous ulcer at the aortic arch. 3. Atheromatous narrowing of approximately 40% at the origin of the great vessels. 4. Severe 80% left subclavian artery stenosis as above. CTA HEAD IMPRESSION: 1. Negative CTA for large vessel occlusion. 2. Mild for age plaque at the carotid siphons without flow-limiting stenosis. Otherwise widely patent anterior and posterior circulation. 3. Basilar artery supplied via at the dominant left vertebral artery. Hypoplastic right vertebral artery terminates in PICA. Electronically Signed   By: Jeannine Boga M.D.   On: 06/27/2016 23:55   Mr  Brain Wo Contrast (neuro Protocol)  Result  Date: 06/27/2016 CLINICAL DATA:  83 year old female with acute onset right arm weakness and numbness upon waking at 0415 hours today. Possible slurred speech at that time. Initial encounter. EXAM: MRI HEAD WITHOUT CONTRAST TECHNIQUE: Multiplanar, multiecho pulse sequences of the brain and surrounding structures were obtained without intravenous contrast. COMPARISON:  Head CT without contrast 1044 hours today and earlier. Brain MRI 08/03/2005 FINDINGS: Brain: Fairly widely scattered small cortical and occasionally white matter foci of restricted diffusion in the posterior left hemisphere including the peri-Rolandic cortex at the right upper extremity representation area (series 4, image 41) the right parietal lobe, as well as the superior and lateral aspect of the left occipital lobe and left occipital pole. No significant associated T2 or FLAIR hyperintensity at this time. No associated hemorrhage or mass effect. No contralateral right hemisphere, deep gray matter nuclei, or posterior fossa restricted diffusion. No midline shift, mass effect, evidence of mass lesion, ventriculomegaly, extra-axial collection or acute intracranial hemorrhage. Cervicomedullary junction and pituitary are within normal limits. Cerebral volume is mildly decreased since 2007 but remains normal for age. Outside of the acute findings there is scattered mostly chronic nonspecific small foci of cerebral white matter T2 and FLAIR hyperintensity. The left hemisphere is slightly more affected. No chronic cortical encephalomalacia or chronic cerebral blood products are identified. Deep gray matter nuclei, brainstem, and cerebellum remain normal. Vascular: Major intracranial vascular flow voids are stable since the 2007 MRI, including dominant distal left vertebral artery. Skull and upper cervical spine: Negative. Normal bone marrow signal. Sinuses/Orbits: Postoperative changes to both globes since the  prior MRI. Otherwise negative orbit soft tissues Visualized paranasal sinuses and mastoids are stable and well pneumatized. Other: Visible internal auditory structures appear normal. Negative scalp soft tissues. IMPRESSION: 1. Widely scattered but small, mostly cortically based, acute infarcts in the posterior Left MCA and the superior Left PCA territories. 2. No associated hemorrhage or mass effect. 3. Otherwise largely stable since 2007 and unremarkable for age noncontrast MRI appearance of the brain. Electronically Signed   By: Genevie Ann M.D.   On: 06/27/2016 14:05     Labs:   Basic Metabolic Panel:  Recent Labs Lab 06/27/16 1059  NA 142  K 4.3  CL 108  CO2 23  GLUCOSE 96  BUN 11  CREATININE 0.87  CALCIUM 9.1   GFR Estimated Creatinine Clearance: 47.5 mL/min (by C-G formula based on SCr of 0.87 mg/dL). Liver Function Tests: No results for input(s): AST, ALT, ALKPHOS, BILITOT, PROT, ALBUMIN in the last 168 hours. No results for input(s): LIPASE, AMYLASE in the last 168 hours. No results for input(s): AMMONIA in the last 168 hours. Coagulation profile  Recent Labs Lab 06/27/16 1059  INR 1.04    CBC:  Recent Labs Lab 06/27/16 1059  WBC 8.5  NEUTROABS 5.3  HGB 15.3*  HCT 45.3  MCV 96.6  PLT 286   Cardiac Enzymes: No results for input(s): CKTOTAL, CKMB, CKMBINDEX, TROPONINI in the last 168 hours. BNP: Invalid input(s): POCBNP CBG: No results for input(s): GLUCAP in the last 168 hours. D-Dimer No results for input(s): DDIMER in the last 72 hours. Hgb A1c  Recent Labs  06/28/16 0416  HGBA1C 5.6   Lipid Profile  Recent Labs  06/28/16 0416  CHOL 167  HDL 31*  LDLCALC 95  TRIG 206*  CHOLHDL 5.4   Thyroid function studies No results for input(s): TSH, T4TOTAL, T3FREE, THYROIDAB in the last 72 hours.  Invalid input(s): FREET3 Anemia work up No results for input(s):  VITAMINB12, FOLATE, FERRITIN, TIBC, IRON, RETICCTPCT in the last 72  hours. Microbiology No results found for this or any previous visit (from the past 240 hour(s)).   Discharge Instructions:   Discharge Instructions    Ambulatory referral to Neurology    Complete by:  As directed    Pt will follow up with Dr. Erlinda Hong at [facility] in about 2-3 months. Thanks.   Ambulatory referral to Physical Therapy    Complete by:  As directed    Diet - low sodium heart healthy    Complete by:  As directed    Discharge instructions    Complete by:  As directed    Recommend outpt follow up with vascular surgery Outpatient TEE and possible loop recorder Limit alcohol consumption to 1 glass/day Tobacco cessation   Increase activity slowly    Complete by:  As directed      Allergies as of 06/29/2016      Reactions   Morphine And Related Swelling   Levofloxacin Anxiety   Double vision      Medication List    STOP taking these medications   aspirin 81 MG tablet     TAKE these medications   atorvastatin 20 MG tablet Commonly known as:  LIPITOR Take 1 tablet (20 mg total) by mouth daily at 6 PM.   clopidogrel 75 MG tablet Commonly known as:  PLAVIX Take 1 tablet (75 mg total) by mouth daily. Start taking on:  06/30/2016   metoprolol succinate 100 MG 24 hr tablet Commonly known as:  TOPROL-XL Take  0.5 tablet ( 50 mg total) once daily   multivitamin tablet Take 1 tablet by mouth daily.   SLEEP AID PO Take 1 tablet by mouth at bedtime.   SYSTANE OP Place 1 drop into both eyes daily as needed. For dry eyes      Follow-up Information    [facility] Follow up.   Specialty:  Rehabilitation Why:  They will contact you for the first appointment.  Contact information: [address] [place], [address] [id number] [place] [phone]       Xu,Jindong, MD. Schedule an appointment as soon as possible for a visit in 6 week(s).   Specialty:  Neurology Contact information: [address]  [place] [address] [phone]        Tawanna Solo, MD Follow up in 1 week(s).   Specialty:  Family Medicine Contact information: [address] [address] [phone]            Time coordinating discharge: 35 min  Signed:  JESSICA Alison Stalling   [facility] 06/29/2016, 1:22 PM

## 2016-06-29 NOTE — Progress Notes (Signed)
STROKE TEAM PROGRESS NOTE   SUBJECTIVE (INTERVAL HISTORY) No family is at the bedside.  She feels right hand dexterity difficulty further improved today although not at baseline. Cardiology on board, planned for outpt TEE and loop.   OBJECTIVE Temp:  [97.9 F (36.6 C)-98.4 F (36.9 C)] 98.4 F (36.9 C) (03/27 0942) Pulse Rate:  [62-65] 65 (03/27 0942) Cardiac Rhythm: Normal sinus rhythm (03/27 0700) Resp:  [16] 16 (03/27 0942) BP: (136-157)/(48-82) 157/74 (03/27 0942) SpO2:  [97 %-99 %] 99 % (03/27 0942)  CBC:   Recent Labs Lab 06/27/16 1059  WBC 8.5  NEUTROABS 5.3  HGB 15.3*  HCT 45.3  MCV 96.6  PLT 269    Basic Metabolic Panel:   Recent Labs Lab 06/27/16 1059  NA 142  K 4.3  CL 108  CO2 23  GLUCOSE 96  BUN 11  CREATININE 0.87  CALCIUM 9.1    Lipid Panel:     Component Value Date/Time   CHOL 167 06/28/2016 0416   TRIG 206 (H) 06/28/2016 0416   HDL 31 (L) 06/28/2016 0416   CHOLHDL 5.4 06/28/2016 0416   VLDL 41 (H) 06/28/2016 0416   LDLCALC 95 06/28/2016 0416   HgbA1c:  Lab Results  Component Value Date   HGBA1C 5.6 06/28/2016   Urine Drug Screen: No results found for: LABOPIA, COCAINSCRNUR, LABBENZ, AMPHETMU, THCU, LABBARB    IMAGING I have personally reviewed the radiological images below and agree with the radiology interpretations.  Ct Head Wo Contrast 06/27/2016 Slight periventricular small vessel disease. No intracranial mass, hemorrhage, or extra-axial fluid collection. No acute infarct evident. Foci of vascular calcification noted. Areas of ethmoid sinus disease noted, more severe on the right than on the left. There is leftward deviation of the nasal septum.   Ct Angio Head W Or Wo Contrast Result Date: 06/27/2016 1. Negative CTA for large vessel occlusion. 2. Mild for age plaque at the carotid siphons without flow-limiting stenosis. Otherwise widely patent anterior and posterior circulation. 3. Basilar artery supplied via at the dominant  left vertebral artery. Hypoplastic right vertebral artery terminates in PICA.   Ct Angio Neck W Or Wo Contrast 06/27/2016 1. Atheromatous plaque about the carotid bifurcations bilaterally with associated stenosis of up to 30% on the right and 40-50% on the left. 2. Aneurysmal dilatation of the ascending aorta up to 4.6 cm. Superimposed penetrating atheromatous ulcer at the aortic arch. 3. Atheromatous narrowing of approximately 40% at the origin of the great vessels. 4. Severe 80% left subclavian artery stenosis as above.   Mr Brain Wo Contrast (neuro Protocol) 06/27/2016 1. Widely scattered but small, mostly cortically based, acute infarcts in the posterior Left MCA and the superior Left PCA territories. 2. No associated hemorrhage or mass effect. 3. Otherwise largely stable since 2007 and unremarkable for age noncontrast MRI appearance of the brain.   LE venous doppler - no DVT.  TTE - Left ventricle: The cavity size was normal. There was mild   concentric hypertrophy. Systolic function was normal. The   estimated ejection fraction was in the range of 55% to 60%. Wall   motion was normal; there were no regional wall motion   abnormalities. Doppler parameters are consistent with abnormal   left ventricular relaxation (grade 1 diastolic dysfunction).   Doppler parameters are consistent with high ventricular filling   pressure. - Aortic valve: Transvalvular velocity was within the normal range.   There was no stenosis. There was no regurgitation. - Mitral valve: Transvalvular velocity was  within the normal range.   There was no evidence for stenosis. There was no regurgitation. - Left atrium: The atrium was moderately dilated. - Right ventricle: The cavity size was normal. Wall thickness was   normal. Systolic function was normal. - Atrial septum: No defect or patent foramen ovale was identified. - Tricuspid valve: There was mild regurgitation. - Pulmonary arteries: Systolic pressure was  within the normal   range. PA peak pressure: 30 mm Hg (S).   PHYSICAL EXAM  Temp:  [97.9 F (36.6 C)-98.4 F (36.9 C)] 98.4 F (36.9 C) (03/27 0942) Pulse Rate:  [62-65] 65 (03/27 0942) Resp:  [16] 16 (03/27 0942) BP: (136-157)/(48-82) 157/74 (03/27 0942) SpO2:  [97 %-99 %] 99 % (03/27 0942)  General - Well nourished, well developed, in no apparent distress.  Ophthalmologic - Sharp disc margins OU.   Cardiovascular - Regular rate and rhythm.  Mental Status -  Level of arousal and orientation to time, place, and person were intact. Language including expression, naming, repetition, comprehension was assessed and found intact. Attention span and concentration were normal. Fund of Knowledge was assessed and was intact.  Cranial Nerves II - XII - II - Visual field intact OU. III, IV, VI - Extraocular movements intact. V - Facial sensation intact bilaterally. VII - Facial movement intact bilaterally. VIII - Hearing & vestibular intact bilaterally. X - Palate elevates symmetrically. XI - Chin turning & shoulder shrug intact bilaterally. XII - Tongue protrusion intact.  Motor Strength - The patient's strength was normal in all extremities except right hand dexterity difficulty and pronator drift was present.  Bulk was normal and fasciculations were absent.   Motor Tone - Muscle tone was assessed at the neck and appendages and was normal.  Reflexes - The patient's reflexes were 1+ in all extremities and she had no pathological reflexes.  Sensory - Light touch, temperature/pinprick were assessed and were symmetrical.    Coordination - The patient had normal movements in the hands and feet with no ataxia or dysmetria.  Tremor was absent.  Gait and Station - deferred.   ASSESSMENT/PLAN [name] is a 83 y.o. female with history of SVT, cataracts and vasal cell carcinoma presenting with R arm weakness and numbness. She did not receive IV t-PA due to being out of the  window.   Stroke:   Scattered L MCA infarcts, embolic secondary to unclear source. Pt does have multiple large vessel vasculopathy, including left ICA, left subclavian A, aortic atheroma. However, pt also has paroxysmal SVT concerning for pAfib. Aortic aneurysm is also a potential embolic source.  Resultant  R hand weakness, pronator drift  CT head small vessel disease. No acute infarct. Ethmoid sinus diease R >L. Leftward deviation of septum  CTA head no LVO. ICA plaque w/o flow limiting stenosis.   CTA neck L 40-50% stenosis. Aneurysmal dilatation of ascending aorta 4.6 cm. Penetrating atheromatous ulcer at the aortic arch. L SCA 80% stenosis distal to [facility] origin  MRI  scattered L MCA territory infarcts  2D Echo EF 55-60%  LE venous doppler no DVT  LDL 95  HgbA1c 5.6  Lovenox 40 mg sq daily for VTE prophylaxis Diet Heart Room service appropriate? Yes; Fluid consistency: Thin  aspirin 81 mg daily prior to admission, changed to clopidogrel 75 mg daily. Continue plavix on discharge.  Patient counseled to be compliant with her antithrombotic medications  Ongoing aggressive stroke risk factor management  Therapy recommendations:  HH PT/OT   Disposition:  home   Paroxysmal SVT Palpitations - PACs and PVCs  Followed by Dr. Mare Ferrari and Dr. Ellyn Hack  No mention of atrial fibrillation or hx of atrial fibrillation on the record  On ASA at home, now on plavix  Cardiology on board  Given embolic stroke pattern, plan for outpt TEE/loop recorder.   Aortic arch aneurysm  CTA showed aortic arch aneurysm 4.6cm  Penetrating atheromatous ulcer at aortic arch  Could be potential embolic source of stroke  Pending outpt TEE and cardiology recs after that  Left subclavian A stenosis  80% stenosis distal to VA origin  Asymptomatic  Recommend outpt follow up with [facility].  Hypertension  On toprol XL 100 at home  Stable Permissive hypertension (OK if < 220/120) but gradually  normalize in 5-7 days Long-term BP goal normotensive  Hyperlipidemia  Home meds:  No statin  LDL 95, goal < 70  Add lipitor 20mg   Continue statin at discharge  Tobacco abuse  Current smoker  Smoking cessation counseling provided  Nicotine patch provided  Pt is not willing to quit at this time  Other Stroke Risk Factors  Advanced age  ETOH use, advised to drink no more than 1 drink(s) a day - pt had wine last night (husband brought into hospital for her)  Other Active Problems  Ethmoid sinus diease  Deviated septum  Hospital day # 1  Neurology will sign off. Please call with questions. Pt will follow up with Dr. Erlinda Hong at [facility] in about 6 weeks. Thanks for the consult.  [name], MD PhD Stroke Neurology 06/29/2016 12:29 PM   To contact Stroke Continuity provider, please refer to [email or web address] After hours, contact General Neurology

## 2016-06-29 NOTE — Progress Notes (Signed)
Pt D/C home, no new concerns, D/C instructions done with teach back, pt verbalize understanding.

## 2016-06-29 NOTE — Care Management (Signed)
Pt also requiring outpatient OT. [facility] outpatient therapy does not have OT. CM spoke to Ms Woodlief and she is interested in attending [facility]. Orders in EPIC and information on the AVS.

## 2016-06-29 NOTE — Care Management Note (Signed)
Case Management Note  Patient Details  Name: Regina James MRN: [id number] Date of Birth: 04/11/1933  Subjective/Objective:                    Action/Plan: Plan is for patient to d/c home with self care when medically stable. MD consulted CM for outpatient therapy. CM met with the patient and she was interested in outpatient therapy at [facility]. Orders in Epic and information on the AVS.   Expected Discharge Date:                  Expected Discharge Plan:  Home/Self Care  In-House Referral:     Discharge planning Services  CM Consult  Post Acute Care Choice:    Choice offered to:     DME Arranged:    DME Agency:     HH Arranged:    [place] Agency:     Status of Service:  Completed, signed off  If discussed at [employer] of Stay Meetings, dates discussed:    Additional Comments:  Pollie Friar, RN 06/29/2016, 12:01 PM

## 2016-06-29 NOTE — Progress Notes (Signed)
Progress Note  Patient Name: Regina James Date of Encounter: 06/29/2016  Primary Cardiologist: ? Harding/Hilty  Subjective   Feels better - still has dexterity issues with R hand.  Inpatient Medications    Scheduled Meds: . atorvastatin  20 mg Oral q1800  . clopidogrel  75 mg Oral Daily  . enoxaparin (LOVENOX) injection  40 mg Subcutaneous Q24H  . fluticasone  2 spray Each Nare Daily  . metoprolol succinate  50 mg Oral Daily  . nicotine  7 mg Transdermal Daily   Continuous Infusions:  PRN Meds: acetaminophen **OR** acetaminophen (TYLENOL) oral liquid 160 mg/5 mL **OR** acetaminophen, senna-docusate   Vital Signs    Vitals:   06/28/16 1035 06/28/16 2121 06/29/16 0021 06/29/16 0602  BP:  (!) 142/82  (!) 136/48  Pulse: 66 62 65 63  Resp: 18 16 16 16   Temp: 98.1 F (36.7 C) 98.4 F (36.9 C) 98 F (36.7 C) 97.9 F (36.6 C)  TempSrc: Oral Oral Oral Oral  SpO2: 98% 98%  97%  Weight:      Height:       No intake or output data in the 24 hours ending 06/29/16 0903 Filed Weights   06/27/16 1003  Weight: 70.3 kg (155 lb)    Telemetry    SR with artifact - Personally Reviewed  ECG    N/A   Physical Exam   General: Pleasant, NAD Psych: Normal affect. Neuro: Alert and oriented X 3. Moves all extremities spontaneously. HEENT: Normal           Neck: Supple without bruits or JVD. Lungs:  Resp regular and unlabored, CTA. Heart: RRR no s3, s4, or murmurs. Abdomen: Soft, non-tender, non-distended, BS + x 4.  Extremities: No clubbing, cyanosis or edema. DP/PT/Radials 2+ and equal bilaterally.  Labs    Chemistry Recent Labs Lab 06/27/16 1059  NA 142  K 4.3  CL 108  CO2 23  GLUCOSE 96  BUN 11  CREATININE 0.87  CALCIUM 9.1  GFRNONAA >60  GFRAA >60  ANIONGAP 11     Hematology Recent Labs Lab 06/27/16 1059  WBC 8.5  RBC 4.69  HGB 15.3*  HCT 45.3  MCV 96.6  MCH 32.6  MCHC 33.8  RDW 14.9  PLT 286    Cardiac EnzymesNo results for  input(s): TROPONINI in the last 168 hours.  Recent Labs Lab 06/27/16 1117  TROPIPOC 0.00     BNPNo results for input(s): BNP, PROBNP in the last 168 hours.   DDimer No results for input(s): DDIMER in the last 168 hours.   Radiology    Ct Angio Head W Or Wo Contrast  Result Date: 06/27/2016 CLINICAL DATA:  Initial evaluation for acute stroke.  The EXAM: CT ANGIOGRAPHY HEAD AND NECK TECHNIQUE: Multidetector CT imaging of the head and neck was performed using the standard protocol during bolus administration of intravenous contrast. Multiplanar CT image reconstructions and MIPs were obtained to evaluate the vascular anatomy. Carotid stenosis measurements (when applicable) are obtained utilizing NASCET criteria, using the distal internal carotid diameter as the denominator. CONTRAST:  50 cc of Isovue 370. COMPARISON:  Prior MRI and CT from earlier the same day. FINDINGS: CTA NECK FINDINGS Aortic arch: Aneurysmal dilatation of the ascending aorta up to 4.6 cm in maximal diameter. Superimposed penetrating atheromatous ulcer at the undersurface of the aortic arch without significant intraluminal thrombus (series 7, image 337). Normal 3 branch morphology. 40% atheromatous narrowing at the origin of the left common carotid artery (  series 8, image 103). Approximately 30-40% stenoses at the origin of the subclavian arteries as well. Multifocal irregularity seen throughout the visualized subclavian arteries. There is a long segment stenosis of approximately 80% within knee distal left subclavian artery (series 8, image 121). Right carotid system: Right common carotid artery patent from its origin to the bifurcation without flow-limiting stenosis. Multifocal atheromatous irregularity about the right bifurcation/ proximal right ICA with mild narrowing of up to 30% by NASCET criteria. Right ICA irregular but patent distally to the skullbase without flow-limiting stenosis. Left carotid system: 40% atheromatous  narrowing at the origin the left common carotid artery. Scattered eccentric noncalcified plaque within the left common carotid artery without flow-limiting stenosis. Calcified noncalcified plaque about the left bifurcation/ proximal left ICA. Associated narrowing of up to 40-50% by NASCET criteria (series 7, image 216). Left ICA widely patent distally to the skullbase. Vertebral arteries: Both of the vertebral arteries arise from the subclavian arteries. Left vertebral artery dominant. Vertebral arteries widely patent within the neck without stenosis or occlusion. Skeleton: Moderate degenerative spondylolysis present at C4-5 through C6-7. Multilevel facet arthrosis. No acute osseus abnormality. No worrisome lytic or blastic osseous lesions. Other neck: No acute soft tissue abnormality within the neck. No adenopathy. Asymmetric enlargement of the right thyroid gland with hypodense nodules measure up to 1 cm, likely related to goiter. Upper chest: Visualized upper mediastinum within normal limits. Scattered atelectatic changes present within the lungs. Visualized lungs are otherwise clear. Review of the MIP images confirms the above findings CTA HEAD FINDINGS Anterior circulation: Petrous segments widely patent bilaterally. Minimal scattered plaque within the cavernous ICAs without flow-limiting stenosis. Supraclinoid segments widely patent. ICA termini patent. A1 segments, anterior communicating artery common anterior cerebral artery widely patent. M1 segments patent without stenosis or occlusion. No proximal M2 occlusion. Distal MCA branches well opacified and symmetric. Posterior circulation: Dominant left vertebral artery widely patent to the vertebrobasilar junction. Hypoplastic right vertebral artery terminates in PICA. Posterior inferior cerebral arteries patent bilaterally. Basilar artery tortuous but widely patent to its distal aspect. Superior cerebral arteries patent bilaterally. Both of the posterior  cerebral artery is predominantly supplied via the basilar artery and are widely patent to their distal aspects. Small right posterior communicating artery noted. Venous sinuses: Patent.  Left transverse sinus hypoplastic. Anatomic variants: Hypoplastic right vertebral artery terminates in PICA. No other significant anatomic variant. No aneurysm or vascular malformation. Delayed phase: No pathologic enhancement. Review of the MIP images confirms the above findings IMPRESSION: CTA NECK IMPRESSION: 1. Atheromatous plaque about the carotid bifurcations bilaterally with associated stenosis of up to 30% on the right and 40-50% on the left. 2. Aneurysmal dilatation of the ascending aorta up to 4.6 cm. Superimposed penetrating atheromatous ulcer at the aortic arch. 3. Atheromatous narrowing of approximately 40% at the origin of the great vessels. 4. Severe 80% left subclavian artery stenosis as above. CTA HEAD IMPRESSION: 1. Negative CTA for large vessel occlusion. 2. Mild for age plaque at the carotid siphons without flow-limiting stenosis. Otherwise widely patent anterior and posterior circulation. 3. Basilar artery supplied via at the dominant left vertebral artery. Hypoplastic right vertebral artery terminates in PICA. Electronically Signed   By: Jeannine Boga M.D.   On: 06/27/2016 23:55   Ct Head Wo Contrast  Result Date: 06/27/2016 CLINICAL DATA:  Right upper extremity weakness EXAM: CT HEAD WITHOUT CONTRAST TECHNIQUE: Contiguous axial images were obtained from the base of the skull through the vertex without intravenous contrast. COMPARISON:  March 23, 2007  FINDINGS: Brain: The ventricles are normal in size and configuration. There is no intracranial mass, hemorrhage, extra-axial fluid collection, or midline shift. There is slight small vessel disease in the centra semiovale bilaterally. Elsewhere gray-white compartments appear normal. No evident acute infarct. Vascular: No hyperdense vessel. There is  calcification in each carotid siphon region. Skull: The bony calvarium appears intact. Sinuses/Orbits: There is mucosal thickening in multiple ethmoid air cells bilaterally. Opacification is greatest in several anterior right-sided ethmoid air cells. Visualized paranasal sinuses otherwise are clear. There is leftward deviation of the nasal septum. Orbits appear symmetric bilaterally. Other: Mastoid air cells are clear. IMPRESSION: Slight periventricular small vessel disease. No intracranial mass, hemorrhage, or extra-axial fluid collection. No acute infarct evident. Foci of vascular calcification noted. Areas of ethmoid sinus disease noted, more severe on the right than on the left. There is leftward deviation of the nasal septum. Electronically Signed   By: Lowella Grip III M.D.   On: 06/27/2016 10:56   Ct Angio Neck W Or Wo Contrast  Result Date: 06/27/2016 CLINICAL DATA:  Initial evaluation for acute stroke.  The EXAM: CT ANGIOGRAPHY HEAD AND NECK TECHNIQUE: Multidetector CT imaging of the head and neck was performed using the standard protocol during bolus administration of intravenous contrast. Multiplanar CT image reconstructions and MIPs were obtained to evaluate the vascular anatomy. Carotid stenosis measurements (when applicable) are obtained utilizing NASCET criteria, using the distal internal carotid diameter as the denominator. CONTRAST:  50 cc of Isovue 370. COMPARISON:  Prior MRI and CT from earlier the same day. FINDINGS: CTA NECK FINDINGS Aortic arch: Aneurysmal dilatation of the ascending aorta up to 4.6 cm in maximal diameter. Superimposed penetrating atheromatous ulcer at the undersurface of the aortic arch without significant intraluminal thrombus (series 7, image 337). Normal 3 branch morphology. 40% atheromatous narrowing at the origin of the left common carotid artery (series 8, image 103). Approximately 30-40% stenoses at the origin of the subclavian arteries as well. Multifocal  irregularity seen throughout the visualized subclavian arteries. There is a long segment stenosis of approximately 80% within knee distal left subclavian artery (series 8, image 121). Right carotid system: Right common carotid artery patent from its origin to the bifurcation without flow-limiting stenosis. Multifocal atheromatous irregularity about the right bifurcation/ proximal right ICA with mild narrowing of up to 30% by NASCET criteria. Right ICA irregular but patent distally to the skullbase without flow-limiting stenosis. Left carotid system: 40% atheromatous narrowing at the origin the left common carotid artery. Scattered eccentric noncalcified plaque within the left common carotid artery without flow-limiting stenosis. Calcified noncalcified plaque about the left bifurcation/ proximal left ICA. Associated narrowing of up to 40-50% by NASCET criteria (series 7, image 216). Left ICA widely patent distally to the skullbase. Vertebral arteries: Both of the vertebral arteries arise from the subclavian arteries. Left vertebral artery dominant. Vertebral arteries widely patent within the neck without stenosis or occlusion. Skeleton: Moderate degenerative spondylolysis present at C4-5 through C6-7. Multilevel facet arthrosis. No acute osseus abnormality. No worrisome lytic or blastic osseous lesions. Other neck: No acute soft tissue abnormality within the neck. No adenopathy. Asymmetric enlargement of the right thyroid gland with hypodense nodules measure up to 1 cm, likely related to goiter. Upper chest: Visualized upper mediastinum within normal limits. Scattered atelectatic changes present within the lungs. Visualized lungs are otherwise clear. Review of the MIP images confirms the above findings CTA HEAD FINDINGS Anterior circulation: Petrous segments widely patent bilaterally. Minimal scattered plaque within the cavernous ICAs without  flow-limiting stenosis. Supraclinoid segments widely patent. ICA termini  patent. A1 segments, anterior communicating artery common anterior cerebral artery widely patent. M1 segments patent without stenosis or occlusion. No proximal M2 occlusion. Distal MCA branches well opacified and symmetric. Posterior circulation: Dominant left vertebral artery widely patent to the vertebrobasilar junction. Hypoplastic right vertebral artery terminates in PICA. Posterior inferior cerebral arteries patent bilaterally. Basilar artery tortuous but widely patent to its distal aspect. Superior cerebral arteries patent bilaterally. Both of the posterior cerebral artery is predominantly supplied via the basilar artery and are widely patent to their distal aspects. Small right posterior communicating artery noted. Venous sinuses: Patent.  Left transverse sinus hypoplastic. Anatomic variants: Hypoplastic right vertebral artery terminates in PICA. No other significant anatomic variant. No aneurysm or vascular malformation. Delayed phase: No pathologic enhancement. Review of the MIP images confirms the above findings IMPRESSION: CTA NECK IMPRESSION: 1. Atheromatous plaque about the carotid bifurcations bilaterally with associated stenosis of up to 30% on the right and 40-50% on the left. 2. Aneurysmal dilatation of the ascending aorta up to 4.6 cm. Superimposed penetrating atheromatous ulcer at the aortic arch. 3. Atheromatous narrowing of approximately 40% at the origin of the great vessels. 4. Severe 80% left subclavian artery stenosis as above. CTA HEAD IMPRESSION: 1. Negative CTA for large vessel occlusion. 2. Mild for age plaque at the carotid siphons without flow-limiting stenosis. Otherwise widely patent anterior and posterior circulation. 3. Basilar artery supplied via at the dominant left vertebral artery. Hypoplastic right vertebral artery terminates in PICA. Electronically Signed   By: Jeannine Boga M.D.   On: 06/27/2016 23:55   Mr Brain Wo Contrast (neuro Protocol)  Result Date:  06/27/2016 CLINICAL DATA:  83 year old female with acute onset right arm weakness and numbness upon waking at 0415 hours today. Possible slurred speech at that time. Initial encounter. EXAM: MRI HEAD WITHOUT CONTRAST TECHNIQUE: Multiplanar, multiecho pulse sequences of the brain and surrounding structures were obtained without intravenous contrast. COMPARISON:  Head CT without contrast 1044 hours today and earlier. Brain MRI 08/03/2005 FINDINGS: Brain: Fairly widely scattered small cortical and occasionally white matter foci of restricted diffusion in the posterior left hemisphere including the peri-Rolandic cortex at the right upper extremity representation area (series 4, image 41) the right parietal lobe, as well as the superior and lateral aspect of the left occipital lobe and left occipital pole. No significant associated T2 or FLAIR hyperintensity at this time. No associated hemorrhage or mass effect. No contralateral right hemisphere, deep gray matter nuclei, or posterior fossa restricted diffusion. No midline shift, mass effect, evidence of mass lesion, ventriculomegaly, extra-axial collection or acute intracranial hemorrhage. Cervicomedullary junction and pituitary are within normal limits. Cerebral volume is mildly decreased since 2007 but remains normal for age. Outside of the acute findings there is scattered mostly chronic nonspecific small foci of cerebral white matter T2 and FLAIR hyperintensity. The left hemisphere is slightly more affected. No chronic cortical encephalomalacia or chronic cerebral blood products are identified. Deep gray matter nuclei, brainstem, and cerebellum remain normal. Vascular: Major intracranial vascular flow voids are stable since the 2007 MRI, including dominant distal left vertebral artery. Skull and upper cervical spine: Negative. Normal bone marrow signal. Sinuses/Orbits: Postoperative changes to both globes since the prior MRI. Otherwise negative orbit soft tissues  Visualized paranasal sinuses and mastoids are stable and well pneumatized. Other: Visible internal auditory structures appear normal. Negative scalp soft tissues. IMPRESSION: 1. Widely scattered but small, mostly cortically based, acute infarcts in the posterior Left  MCA and the superior Left PCA territories. 2. No associated hemorrhage or mass effect. 3. Otherwise largely stable since 2007 and unremarkable for age noncontrast MRI appearance of the brain. Electronically Signed   By: Genevie Ann M.D.   On: 06/27/2016 14:05    Cardiac Studies   Echo: EF 55-60%. Mild concentric LVH. Gr 1 DD (normal for age). No AS/AI/MR/MS.  No embolic source noted.  Patient Profile     83 y.o. female with PMH of PSVT (very few episodes - at least no Sx for years) who presented with R sided weakness c/w L MCA/PCA CVA. Had short Irregular Rhythm on tele - that looked like PAT (~14 beats).  Cards consulted for Dx management.  Assessment & Plan    Principal Problem:   Right arm weakness Active Problems:   Paroxysmal SVT (supraventricular tachycardia) (HCC)   Tobacco abuse   Right arm numbness   Deviated septum   Disorder of ethmoidal sinus   Acute ischemic stroke (HCC)   Dysarthria  Likely R sided CVA (shower of emboli) - Sx improving  -- started on Plavix & Atorvastatin.  Agree with Plavix for prophylaxis unless a true cardio-embolic source is identified.  Echo unrevealing -not unexpectedly -- Rec: TEE with [facility] (unable to schedule for today 2/2 full schedule) - can do as OP.  We asked EP to evaluate today (Tele review) & determine pros-cons of ILR insertion for occult CVA   Signed, Glenetta Hew, MD  06/29/2016, 9:03 AM

## 2016-07-01 ENCOUNTER — Other Ambulatory Visit: Payer: Self-pay

## 2016-07-01 NOTE — Patient Outreach (Signed)
[facility]) Care Management  07/01/2016  Regina James May 26, 1933 [id number]   EMMI stroke red alert: Day #1 REFERRAL DATE; 07/01/16 REFERRAL REASON:  Scheduled follow up appointment;  NO  Telephone call to patient regarding Emmi RED alert. Unable to reach patient at this time. HIPAA complaint voice message left with call back phone number.   PLAN;  RNCM will attempt 2nd telephone call to patient within 1 week   Quinn Plowman RN,BSN,CCM [facility] Telephonic  [phone]

## 2016-07-02 ENCOUNTER — Other Ambulatory Visit: Payer: Self-pay

## 2016-07-02 NOTE — Patient Outreach (Signed)
[name] Care Management  07/02/2016  Regina James 1934-01-19 [id number]   EMMI stroke red alert: Day #1 REFERRAL DATE: 07/01/16 REFERRAL SOURCE: EMMI stroke alert REFERRAL REASON; Scheduled a follow up appointment: NO  PROVIDER: Dr. Kathyrn Lass - primary  SOCIAL:  Lives with spouse Patient able to drive  Telephone call to patient regarding EMMI stroke red alert. HIPAA verified with patient. Patient states she is doing fine.  Patient states she is waiting to hear back from her primary MD office. States she received a call from them after she got out of the hospital and they were suppose to call her back with an appointment. RNCM advised patient to call and scheduled appointment.  Patient states she is suppose to have occupational therapy at the [facility] health outpatient rehab facility. Patient states she is waiting to hear from them.  RNCM advised patient to call and schedule appointment. Patient confirmed she had contact name, address and phone number for rehab facility. RNCM informed patient she will attempt call to rehab facility for patient today.  If no return call from [facility] then no that rehab office is closed. Patient voiced understanding. Patient states she is to have rehab to her right hand and fingers because she is  Unable to use it well to write.  Patient reports she is able to drive and can provide her own transportation to her appointments.   Patient reports she has all of her medications and takes them as prescribed.  Patient states she is to have a TEE test done and placement of loop recorder. RNCM reviewed sign/symptoms of stroke with patient. Advised to call 911 for stroke like symptoms.  Patient denies having any further needs at this time.   PLAN; RNCM will refer patient to care management assistant to close due to patient having no further needs.  Quinn Plowman RN,BSN,CCM [facility] Telephonic  [phone]

## 2016-07-06 DIAGNOSIS — E782 Mixed hyperlipidemia: Secondary | ICD-10-CM | POA: Diagnosis not present

## 2016-07-06 DIAGNOSIS — F17210 Nicotine dependence, cigarettes, uncomplicated: Secondary | ICD-10-CM | POA: Diagnosis not present

## 2016-07-06 DIAGNOSIS — I6523 Occlusion and stenosis of bilateral carotid arteries: Secondary | ICD-10-CM | POA: Diagnosis not present

## 2016-07-06 DIAGNOSIS — I712 Thoracic aortic aneurysm, without rupture: Secondary | ICD-10-CM | POA: Diagnosis not present

## 2016-07-06 DIAGNOSIS — I491 Atrial premature depolarization: Secondary | ICD-10-CM | POA: Diagnosis not present

## 2016-07-06 DIAGNOSIS — I771 Stricture of artery: Secondary | ICD-10-CM | POA: Diagnosis not present

## 2016-07-06 DIAGNOSIS — Z8673 Personal history of transient ischemic attack (TIA), and cerebral infarction without residual deficits: Secondary | ICD-10-CM | POA: Diagnosis not present

## 2016-07-07 ENCOUNTER — Encounter: Payer: Self-pay | Admitting: Occupational Therapy

## 2016-07-07 ENCOUNTER — Telehealth: Payer: Self-pay | Admitting: Neurology

## 2016-07-07 ENCOUNTER — Ambulatory Visit: Payer: Medicare Other | Attending: Internal Medicine | Admitting: Occupational Therapy

## 2016-07-07 ENCOUNTER — Ambulatory Visit: Payer: Medicare Other | Admitting: Rehabilitative and Restorative Service Providers"

## 2016-07-07 DIAGNOSIS — I69951 Hemiplegia and hemiparesis following unspecified cerebrovascular disease affecting right dominant side: Secondary | ICD-10-CM | POA: Diagnosis not present

## 2016-07-07 DIAGNOSIS — M6281 Muscle weakness (generalized): Secondary | ICD-10-CM | POA: Insufficient documentation

## 2016-07-07 DIAGNOSIS — R278 Other lack of coordination: Secondary | ICD-10-CM | POA: Diagnosis not present

## 2016-07-07 NOTE — Therapy (Signed)
[facility] [address] [place], [place], [address] Phone: [phone]   Fax:  [phone]  Occupational Therapy Evaluation  Patient Details  Name: Regina James MRN: [id number] Date of Birth: 1933/11/14 Referring Provider: Dr. Erlinda Hong  Encounter Date: 07/07/2016      OT End of Session - 07/07/16 1403    Visit Number 1   Number of Visits 8   Date for OT Re-Evaluation 12/02/16   Authorization Type Medicare will need G code and PN every 10th visit   Authorization Time Period 60 days   OT Start Time 1316   OT Stop Time 1350   OT Time Calculation (min) 34 min   Activity Tolerance Patient tolerated treatment well      Past Medical History:  Diagnosis Date  . Cancer ([place])    basal cell carcinoma  . History of PSVT (paroxysmal supraventricular tachycardia)    Reported back as far as 2010.  Marland Kitchen Palpitations     Past Surgical History:  Procedure Laterality Date  . APPENDECTOMY    . CHOLECYSTECTOMY    . ROTATOR CUFF REPAIR    . US ECHOCARDIOGRAPHY  01/09/2009   EF 55-60%  . WRIST FRACTURE SURGERY      There were no vitals filed for this visit.      Subjective Assessment - 07/07/16 1313    Pertinent History see epic - pt with multi infract in R MCA and R PCA territory   Patient Stated Goals I havet trouble with the small things using my hand and hand writing   Currently in Pain? No/denies           [facility] OT Assessment - 07/07/16 1315      Assessment   Diagnosis L CVA   Referring Provider Dr. Erlinda Hong   Onset Date 06/27/16   Prior Therapy acute PT, OT      Precautions   Precautions None     Restrictions   Weight Bearing Restrictions No     Balance Screen   Has the patient fallen in the past 6 months No  Pt had PT eval today     Home  Environment   Family/patient expects to be discharged to: Private residence   Living Arrangements Spouse/significant other   Available Help at Discharge Available  PRN/intermittently   Type of Home Other (Comment)  town house   Home Layout Two level   Bathroom Shower/Tub Walk-in Musician   Additional Comments Pt has a built in shower seat but does not use it     Prior Function   Level of Independence Independent   Leisure very active lifestyle     ADL   Eating/Feeding Independent   Grooming --  increased time   Upper Body Bathing Independent   Lower Body Bathing Independent   Upper Body Dressing Increased time  for buttons and tying   Lower Body Dressing Increased time  buttons and tying   Engineering geologist - Water engineer -  Development worker, community Independent     IADL   Shopping Shops independently for small [place] alone or with occasional assistance   Meal Prep Plans, prepares and serves adequate meals independently  increased time for [place] own vehicle   Medication Management Is responsible for taking medication in correct dosages at correct time  increased time  to pick up small pills   Financial Management Manages financial matters independently (budgets, writes checks, pays rent, bills goes to bank), collects and keeps track of income     Mobility   Mobility Status Independent     Written Expression   Dominant Hand Right   Handwriting 50% legible  for name in cursive     Vision - History   Baseline Vision Wears glasses only for reading   Additional Comments Pt denies visual changes     Activity Tolerance   Activity Tolerance Tolerate 30+ min activity without fatigue   Activity Tolerance Comments Pt finds she is a little more fatigued than before.      Cognition   Overall Cognitive Status Within Functional Limits for tasks assessed     Sensation   Light Touch Appears Intact   Hot/Cold Appears Intact   Proprioception Appears Intact     Coordination    Gross Motor Movements are Fluid and Coordinated Yes   Fine Motor Movements are Fluid and Coordinated No   9 Hole Peg Test Right;Left   Right 9 Hole Peg Test 41.22   Left 9 Hole Peg Test 27.66     Tone   Assessment Location Right Upper Extremity     ROM / Strength   AROM / PROM / Strength AROM;Strength     AROM   Overall AROM  Within functional limits for tasks performed   Overall AROM Comments BUE's     Strength   Overall Strength Deficits   Overall Strength Comments LUE with 3+/5 for proximal strength (flexion and abduction), LUE overall 4/5 - pt reports she has had rotator cuff surgery in R shoulder but feels it is weaker than it was before. Pt states LUE is very functional however she is also having some issues with discomfort and possible rotator cuff issues but this is being monitored by MD     Hand Function   Right Hand Gross Grasp Functional   Right Hand Grip (lbs) 75   Left Hand Gross Grasp Functional   Left Hand Grip (lbs) 65     RUE Tone   RUE Tone Modified Ashworth     RUE Tone   Modified Ashworth Scale for Grading Hypertonia RUE Slight increase in muscle tone, manifested by a catch and release or by minimal resistance at the end of the range of motion when the affected part(s) is moved in flexion or extension                              OT Long Term Goals - 07/07/16 1353      OT LONG TERM GOAL #1   Title Pt will be mod I with HEP for coordination and RUE proximal strength - 08/04/2016   Status New     OT LONG TERM GOAL #2   Title Pt will demonstrate improved fine motor coordination as evidenced by decreasing time on 9 hole peg by at least 7 seconds (baseline= 41.22) to assist with functional tasks   Status New     OT LONG TERM GOAL #3   Title Pt will report decrease in time in grooming, buttoning, tying for basic ADL's     OT LONG TERM GOAL #4   Title Pt will be able to write at 3 sentence paragraph legibly AE prn   Status New      OT LONG TERM GOAL #5   Title Pt will  report greater ease in managing earings   Status New     OT LONG TERM GOAL #6   Title Pt will report greater ease in meal prep.    Status New     OT LONG TERM GOAL #8   Title Pt will be able to retrieve 2 pound object off overhead shelf x3 without dropping with RUE   Status New               Plan - 07/07/16 1358    Clinical Impression Statement Pt is an 83 year old female s/p multi infarcts of R MCA and R PCA territory on 06/27/2016. Pt was hospitalized until 06/29/2016.  PMH: PSVT, claudication, +tobacco use.  Pt presents today with mild R hemiplegia, decreased coordination R hand, mild increased tone in RUE that impacts pt's ability to use RUE as dominant hand for ADL's, IADL"s and leisure activities.  Pt will benefit from short course of skilled OT to maximize return of RUE.    Rehab Potential Excellent   OT Frequency 2x / week   OT Duration 4 weeks   OT Treatment/Interventions Self-care/ADL training;Therapeutic exercise;Neuromuscular education;DME and/or AE instruction;Therapeutic activities;Patient/family education   Plan review goals, initiate HEP for coordination, proximal strength (gentle due to prior rotator cuff issues)   Consulted and Agree with Plan of Care Patient      Patient will benefit from skilled therapeutic intervention in order to improve the following deficits and impairments:  Decreased coordination, Decreased strength, Impaired UE functional use, Impaired tone  Visit Diagnosis: Hemiplegia of right dominant side due to cerebrovascular disease, unspecified cerebrovascular disease type, unspecified hemiplegia type ([place]) - Plan: Ot plan of care cert/re-cert  Muscle weakness (generalized) - Plan: Ot plan of care cert/re-cert  Other lack of coordination - Plan: Ot plan of care cert/re-cert      G-Codes - 69/67/89 1405    Functional Assessment Tool Used (Outpatient only) 9 hole peg, skilled clinical observation    Functional Limitation Carrying, moving and handling objects   Carrying, Moving and Handling Objects Current Status (F8101) At least 1 percent but less than 20 percent impaired, limited or restricted   Carrying, Moving and Handling Objects Goal Status (B5102) At least 1 percent but less than 20 percent impaired, limited or restricted      Problem List Patient Active Problem List   Diagnosis Date Noted  . Right arm weakness 06/27/2016  . Right arm numbness 06/27/2016  . Deviated septum 06/27/2016  . Disorder of ethmoidal sinus 06/27/2016  . Acute ischemic stroke ([place])   . Dysarthria   . Paroxysmal SVT (supraventricular tachycardia) ([place]) 05/21/2016  . Angiopathy, peripheral ([place]) 01/07/2015  . Cloudy posterior capsule 06/01/2013  . Pseudoaphakia 05/02/2013  . Malaise and fatigue 02/16/2013  . Cataract, nuclear 01/28/2012  . Palpitations 02/17/2011  . Claudication ([place]) 02/17/2011  . Tobacco abuse 02/17/2011    Quay Burow, OTR/L 07/07/2016, 4:26 PM  [facility] [address] [place], [place], [address] Phone: [phone]   Fax:  [phone]  Name: Regina James MRN: [id number] Date of Birth: [birth year]

## 2016-07-08 ENCOUNTER — Ambulatory Visit: Payer: Medicare Other | Admitting: Rehabilitative and Restorative Service Providers"

## 2016-07-08 NOTE — Telephone Encounter (Signed)
The message below was sent to [place] by the answering service while the phones were down yesterday. Below is Dr. Rhea Belton (on-call doctor at the time) recommendations.

## 2016-07-08 NOTE — Telephone Encounter (Signed)
May schedule her to see Dr. Jaynee Eagles or Dr. Leta Baptist if they have appointment available sooner.

## 2016-07-08 NOTE — Telephone Encounter (Signed)
Patient schedule with Dr. Colen Darling for earlier stroke hospital appt. Pt will have to wait for driving questions on Monday when Dr. Erlinda Hong returns. Pt will call on Monday.Pt wants to know if she can drive?

## 2016-07-08 NOTE — Therapy (Signed)
[facility] [address] [place], [place], [address] Phone: [phone]   Fax:  [phone]  Physical Therapy Evaluation  Patient Details  Name: Regina James MRN: [id number] Date of Birth: 09-16-1933 Referring Provider: Eulogio Bear, MD, however will send cert to Dr. Erlinda Hong, neurology as he will be following up with patient.  Encounter Date: 07/07/2016      PT End of Session - 07/08/16 1035    Visit Number 1   Number of Visits 1   Authorization Type evaluation only/ medicare   PT Start Time 1238   PT Stop Time 7209   PT Time Calculation (min) 29 min   Activity Tolerance Patient tolerated treatment well   Behavior During Therapy WFL for tasks assessed/performed      Past Medical History:  Diagnosis Date  . Cancer ([place])    basal cell carcinoma  . History of PSVT (paroxysmal supraventricular tachycardia)    Reported back as far as 2010.  Marland Kitchen Palpitations     Past Surgical History:  Procedure Laterality Date  . APPENDECTOMY    . CHOLECYSTECTOMY    . ROTATOR CUFF REPAIR    . US ECHOCARDIOGRAPHY  01/09/2009   EF 55-60%  . WRIST FRACTURE SURGERY      There were no vitals filed for this visit.       Subjective Assessment - 07/07/16 1243    Subjective The patient woke on 06/27/16 noting numbness and weakness in the right arm.  She was admitted to [facility] and found to have L MCA infarcts.  She notes her balance is not at all impacted.  She notes more fatigue.  She takes care of most things around the house and is usually busy- she notes some increased fatigue.  Her main complaint today is function of the right hand noting dec'd coordination, dec'd muscle mass, writing.    The patient has already returned to driving and drove herself today.    Patient Stated Goals "I would like to be able to write legibly again."   Currently in Pain? No/denies            [facility] PT Assessment - 07/07/16 1247       Assessment   Medical Diagnosis stroke, L MCA   Referring Provider Eulogio Bear, MD, however will send cert to Dr. Erlinda Hong, neurology as he will be following up with patient.   Onset Date/Surgical Date 06/27/16   Hand Dominance Right   Prior Therapy acute PT     Precautions   Precautions None     Restrictions   Weight Bearing Restrictions No     Balance Screen   Has the patient fallen in the past 6 months No   Has the patient had a decrease in activity level because of a fear of falling?  No   Is the patient reluctant to leave their home because of a fear of falling?  No     Home Environment   Living Environment Private residence   Living Arrangements Spouse/significant other   Type of [place] to enter   Entrance Stairs-Rails --  rails are there, but does not need   Home Layout Two level   Additional Comments plays golf on weekends, plays bridge weekly     Prior Function   Level of Independence Independent   Leisure active lifestyle     Cognition   Overall Cognitive Status Within Functional Limits for tasks assessed  Observation/Other Assessments   Observations Patient notes having to "reach" for words at time, describing knowing what she wants to say and not finding the word.     Sensation   Light Touch Appears Intact  was numb at onset, has improved     Posture/Postural Control   Posture/Postural Control No significant limitations     ROM / Strength   AROM / PROM / Strength AROM;Strength     AROM   Overall AROM  Within functional limits for tasks performed     Strength   Overall Strength Deficits   Overall Strength Comments UE 3/5 R shoulder flexion, 4/5 R shoulder abduction, 4/5 L shoulder flexion, 4-/5 L shoulder abduction with discomfort noted.  5/5 bilateral elbow flexion   Strength Assessment Site Hip;Knee;Ankle   Right/Left Hip Right;Left   Right Hip Flexion 5/5   Left Hip Flexion 5/5   Right/Left Knee Right;Left   Right Knee  Flexion 5/5   Right Knee Extension 5/5   Left Knee Flexion 5/5   Left Knee Extension 5/5   Right/Left Ankle Right;Left   Right Ankle Dorsiflexion 5/5   Left Ankle Dorsiflexion 5/5     Ambulation/Gait   Ambulation/Gait Yes   Ambulation/Gait Assistance 7: Independent   Ambulation Distance (Feet) 200 Feet   Assistive device None   Gait Pattern Within Functional Limits   Ambulation Surface Level   Gait velocity 3.55 ft/sec   Stairs Yes   Stairs Assistance 7: Independent   Stair Management Technique No rails;Alternating pattern   Number of Stairs 4     Standardized Balance Assessment   Standardized Balance Assessment Berg Balance Test;Timed Up and Go Test     Berg Balance Test   Sit to Stand Able to stand without using hands and stabilize independently   Standing Unsupported Able to stand safely 2 minutes   Sitting with Back Unsupported but Feet Supported on Floor or Stool Able to sit safely and securely 2 minutes   Stand to Sit Sits safely with minimal use of hands   Transfers Able to transfer safely, minor use of hands   Standing Unsupported with Eyes Closed Able to stand 10 seconds safely   Standing Ubsupported with Feet Together Able to place feet together independently and stand 1 minute safely   From Standing, Reach Forward with Outstretched Arm Can reach confidently >25 cm (10")   From Standing Position, Pick up Object from Floor Able to pick up shoe safely and easily   From Standing Position, Turn to Look Behind Over each Shoulder Looks behind from both sides and weight shifts well   Turn 360 Degrees Able to turn 360 degrees safely in 4 seconds or less  takes 2 lateral steps to correct- notes mild dizziness   Standing Unsupported, Alternately Place Feet on Step/Stool Able to stand independently and safely and complete 8 steps in 20 seconds   Standing Unsupported, One Foot in Front Able to plae foot ahead of the other independently and hold 30 seconds   Standing on One Leg  Able to lift leg independently and hold equal to or more than 3 seconds   Total Score 53   Berg comment: 53/56 indicating lower risk for falls.     Timed Up and Go Test   TUG --  8.32 seconds without a device independently  PT Long Term Goals - 07/08/16 1038      PT LONG TERM GOAL #1   Title None established- no further f/u needed at this time.               Plan - 07/08/16 1036    Clinical Impression Statement The patient is an 83 yo female s/p CVA 06/27/16.  She presents to PT today reporting R UE main deficit.  She is WFLs for gait and balance activities and has returned to [employer].  OT to address R UE deficits.  No PT indicated.   Consulted and Agree with Plan of Care Patient      Patient will benefit from skilled therapeutic intervention in order to improve the following deficits and impairments:     Visit Diagnosis: Muscle weakness (generalized) - Plan: PT plan of care cert/re-cert      G-Codes - 92/92/44 1038    Functional Assessment Tool Used (Outpatient Only) Berg=53/56.   Functional Limitation Mobility: Walking and moving around   Mobility: Walking and Moving Around Current Status [phone]) At least 1 percent but less than 20 percent impaired, limited or restricted   Mobility: Walking and Moving Around Goal Status [phone]) At least 1 percent but less than 20 percent impaired, limited or restricted   Mobility: Walking and Moving Around Discharge Status [phone]) At least 1 percent but less than 20 percent impaired, limited or restricted       Problem List Patient Active Problem List   Diagnosis Date Noted  . Right arm weakness 06/27/2016  . Right arm numbness 06/27/2016  . Deviated septum 06/27/2016  . Disorder of ethmoidal sinus 06/27/2016  . Acute ischemic stroke ([place])   . Dysarthria   . Paroxysmal SVT (supraventricular tachycardia) ([place]) 05/21/2016  . Angiopathy, peripheral ([place]) 01/07/2015   . Cloudy posterior capsule 06/01/2013  . Pseudoaphakia 05/02/2013  . Malaise and fatigue 02/16/2013  . Cataract, nuclear 01/28/2012  . Palpitations 02/17/2011  . Claudication ([place]) 02/17/2011  . Tobacco abuse 02/17/2011    Angelicia Lessner, PT 07/08/2016, 10:40 AM  [facility] [address] [place], [place], [address] Phone: [phone]   Fax:  [phone]  Name: Regina James MRN: [id number] Date of Birth: [birth year]

## 2016-07-08 NOTE — Telephone Encounter (Signed)
If patient calls back please schedule her with Dr. Leta Baptist or Dr. Jaynee Eagles as a new pt hospital follow upon within 6 weeks after March discharge date.  Rn left message for patient to call back that a different Md can see her for the hospital follow up in 6 weeks, which will be May 2018. If patient wants to know if she can drive.The driving issue should of being address at discharge at the hospital.

## 2016-07-12 ENCOUNTER — Encounter: Payer: Self-pay | Admitting: Occupational Therapy

## 2016-07-12 ENCOUNTER — Telehealth: Payer: Self-pay | Admitting: Neurology

## 2016-07-12 ENCOUNTER — Ambulatory Visit: Payer: Medicare Other | Admitting: Occupational Therapy

## 2016-07-12 DIAGNOSIS — R278 Other lack of coordination: Secondary | ICD-10-CM | POA: Diagnosis not present

## 2016-07-12 DIAGNOSIS — I69951 Hemiplegia and hemiparesis following unspecified cerebrovascular disease affecting right dominant side: Secondary | ICD-10-CM | POA: Diagnosis not present

## 2016-07-12 DIAGNOSIS — M6281 Muscle weakness (generalized): Secondary | ICD-10-CM

## 2016-07-12 NOTE — Telephone Encounter (Signed)
Patient is in the lobby asking if she has clearance to drive. She will wait a few minutes in the lobby but her best phone number is [phone]

## 2016-07-12 NOTE — Therapy (Signed)
[facility] [address] [place], [place], [address] Phone: [phone]   Fax:  [phone]  Occupational Therapy Treatment  Patient Details  Name: Regina James MRN: [id number] Date of Birth: [birth year] Referring Provider: Dr. Erlinda Hong  Encounter Date: 07/12/2016      OT End of Session - 07/12/16 1112    Visit Number 2   Number of Visits 8   Date for OT Re-Evaluation 12/02/16   Authorization Type Medicare will need G code and PN every 10th visit   Authorization Time Period 60 days   OT Start Time 0802   OT Stop Time 0844   OT Time Calculation (min) 42 min   Activity Tolerance Patient tolerated treatment well      Past Medical History:  Diagnosis Date  . Cancer ([place])    basal cell carcinoma  . History of PSVT (paroxysmal supraventricular tachycardia)    Reported back as far as 2010.  Marland Kitchen Palpitations     Past Surgical History:  Procedure Laterality Date  . APPENDECTOMY    . CHOLECYSTECTOMY    . ROTATOR CUFF REPAIR    . US ECHOCARDIOGRAPHY  01/09/2009   EF 55-60%  . WRIST FRACTURE SURGERY      There were no vitals filed for this visit.      Subjective Assessment - 07/12/16 0803    Subjective  I like these goals   Pertinent History see epic - pt with multi infract in L MCA and L PCA territory   Patient Stated Goals I havet trouble with the small things using my hand and hand writing   Currently in Pain? No/denies                      OT Treatments/Exercises (OP) - 07/12/16 0001      ADLs   Writing Utlized various types of AE for writing - pt does best with coban wrapped on pen to assist with sliding.  Pt to practice at home and provided with coban.    ADL Comments Reviewed goals and tx plan - pt in agreement. Pt provided with written copy of goals.      Fine Motor Coordination   Other Fine Motor Exercises Pt issued fine motor HEP - pt able to return demonstrate all activities after  practice.  Pt with some frusration and moderate difficulty. Provided support and encouragement and discussed that if activities were easy pt would not need to do them. Pt verbalized understanding.                 OT Education - 07/12/16 1108    Education provided Yes   Education Details HEP for fine motor coordination   Person(s) Educated Patient   Methods Explanation;Demonstration;Verbal cues;Handout   Comprehension Verbalized understanding;Returned demonstration             OT Long Term Goals - 07/12/16 1109      OT LONG TERM GOAL #1   Title Pt will be mod I with HEP for coordination and RUE proximal strength - 08/04/2016   Status On-going     OT LONG TERM GOAL #2   Title Pt will demonstrate improved fine motor coordination as evidenced by decreasing time on 9 hole peg by at least 7 seconds (baseline= 41.22) to assist with functional tasks   Status On-going     OT LONG TERM GOAL #3   Title Pt will report decrease in time in grooming, buttoning, tying  for basic ADL's   Status On-going     OT LONG TERM GOAL #4   Title Pt will be able to write at 3 sentence paragraph legibly AE prn   Status On-going     OT LONG TERM GOAL #5   Title Pt will report greater ease in managing earings   Status On-going     OT LONG TERM GOAL #6   Title Pt will report greater ease in meal prep.    Status On-going     OT LONG TERM GOAL #8   Title Pt will be able to retrieve 2 pound object off overhead shelf x3 without dropping with RUE   Status On-going               Plan - 07/12/16 1110    Clinical Impression Statement Pt progressing toward goals  Pt in agreement with goals and tx plan.    Rehab Potential Excellent   OT Frequency 2x / week   OT Duration 4 weeks   OT Treatment/Interventions Self-care/ADL training;Therapeutic exercise;Neuromuscular education;DME and/or AE instruction;Therapeutic activities;Patient/family education   Plan check HEP, give HEP for gentle  proximal strengthening of RUE>    Consulted and Agree with Plan of Care Patient      Patient will benefit from skilled therapeutic intervention in order to improve the following deficits and impairments:  Decreased coordination, Decreased strength, Impaired UE functional use, Impaired tone  Visit Diagnosis: Muscle weakness (generalized)  Hemiplegia of right dominant side due to cerebrovascular disease, unspecified cerebrovascular disease type, unspecified hemiplegia type ([place])  Other lack of coordination    Problem List Patient Active Problem List   Diagnosis Date Noted  . Right arm weakness 06/27/2016  . Right arm numbness 06/27/2016  . Deviated septum 06/27/2016  . Disorder of ethmoidal sinus 06/27/2016  . Acute ischemic stroke ([place])   . Dysarthria   . Paroxysmal SVT (supraventricular tachycardia) ([place]) 05/21/2016  . Angiopathy, peripheral ([place]) 01/07/2015  . Cloudy posterior capsule 06/01/2013  . Pseudoaphakia 05/02/2013  . Malaise and fatigue 02/16/2013  . Cataract, nuclear 01/28/2012  . Palpitations 02/17/2011  . Claudication ([place]) 02/17/2011  . Tobacco abuse 02/17/2011    Quay Burow, OTR/L 07/12/2016, 11:13 AM  [facility] [address] [place], [place], [address] Phone: [phone]   Fax:  [phone]  Name: Regina James MRN: [id number] Date of Birth: 08/02/1933

## 2016-07-12 NOTE — Telephone Encounter (Signed)
RN call patient about her driving ability. Rn gave patient Dr. Erlinda Hong recommendations below. Pt stated the below" " I have been driving with my husband several times, and he and I feel comfortable driving alone". RN stress to patient she is to not drive at night time long distance, or unfamiliar roads. Pt stated " I did drive at night time several times". Rn stated Dr. Erlinda Hong recommendations stated she does not drive at night time. She is to drive only during the daytime for her safety. Patient ask how long will these recommendations be in effect. Rn stated the recommendations will remain in effect until she sees Dr. Leta Baptist on May 8,2018 for her hospital follow up. Also once she sees Dr. Leta Baptist she will see Dr. Erlinda Hong ongoing. Pt verbalized understanding to all the recommendations provided by Dr. Erlinda Hong.

## 2016-07-12 NOTE — Telephone Encounter (Signed)
Message was sent to Dr. Erlinda Hong last week. Pt was told he was out of the office and will contact her about driving ability. Pt showed up in office today.Message will be sent to Dr. Erlinda Hong.

## 2016-07-12 NOTE — Telephone Encounter (Signed)
From stroke standpoint, no restriction for driving, but recommend to drive with family members on board for 2-3 times initially. If both parties feel comfortable of your driving, you can drive alone after. However, you are recommended to drive during the day not at night, no long distance and drive in familiar roads.  Thank you for arranging her appointment with Dr. Leta Baptist. She has multiple vascular issues to be taken care of, so I initially set up her appointment with me. If my schedule is not available, she should see either Dr. Leta Baptist or Dr. Jaynee Eagles. Next time I will make it more clear when I order the follow up appointment. Thanks.   [name], MD PhD Stroke Neurology 07/12/2016 4:21 PM

## 2016-07-12 NOTE — Patient Instructions (Signed)
  Coordination Activities  Perform the following activities for 15-20 minutes 1-2 times per day with right hand(s).   Rotate ball in fingertips (clockwise and counter-clockwise). Accuracy not speed  Toss ball between hands. Accuracy not speed  Toss ball in your right hand only  Accuracy not speed  Flip cards 1 at a time as fast as you can.  SPEED  Shuffle cards. SPEED  Pick up coins and place in container or coin bank. SPEED  Pick up coins and stack.  SPEED  Pick up coins one at a time until you get 5-10 in your hand, then move coins from palm to fingertips to stack one at a time.  Accuracy not speed  Practice writing and/or typing.  Accuracy not speed   Screw together nuts and bolts, then unfasten. SPEED

## 2016-07-15 ENCOUNTER — Encounter: Payer: Self-pay | Admitting: Occupational Therapy

## 2016-07-15 ENCOUNTER — Ambulatory Visit: Payer: Medicare Other | Admitting: Occupational Therapy

## 2016-07-15 DIAGNOSIS — R278 Other lack of coordination: Secondary | ICD-10-CM

## 2016-07-15 DIAGNOSIS — I69951 Hemiplegia and hemiparesis following unspecified cerebrovascular disease affecting right dominant side: Secondary | ICD-10-CM

## 2016-07-15 DIAGNOSIS — M6281 Muscle weakness (generalized): Secondary | ICD-10-CM

## 2016-07-15 NOTE — Therapy (Signed)
[facility] [address] [place], [place], [address] Phone: [phone]   Fax:  [phone]  Occupational Therapy Treatment  Patient Details  Name: Regina James MRN: [id number] Date of Birth: 15-Sep-1933 Referring Provider: Dr. Erlinda Hong  Encounter Date: 07/15/2016      OT End of Session - 07/15/16 1700    Visit Number 3   Number of Visits 8   Date for OT Re-Evaluation 12/02/16   Authorization Type Medicare will need G code and PN every 10th visit   Authorization Time Period 60 days   OT Start Time 1400   OT Stop Time 1444   OT Time Calculation (min) 44 min   Activity Tolerance Patient tolerated treatment well      Past Medical History:  Diagnosis Date  . Cancer ([place])    basal cell carcinoma  . History of PSVT (paroxysmal supraventricular tachycardia)    Reported back as far as 2010.  Marland Kitchen Palpitations     Past Surgical History:  Procedure Laterality Date  . APPENDECTOMY    . CHOLECYSTECTOMY    . ROTATOR CUFF REPAIR    . US ECHOCARDIOGRAPHY  01/09/2009   EF 55-60%  . WRIST FRACTURE SURGERY      There were no vitals filed for this visit.      Subjective Assessment - 07/15/16 1406    Subjective  I haven't gotten the things I need to do that home exercise program yet but I will.  I know what I need to do.    Pertinent History see epic - pt with multi infract in L MCA and L PCA territory   Patient Stated Goals I havet trouble with the small things using my hand and hand writing   Currently in Pain? No/denies                      OT Treatments/Exercises (OP) - 07/15/16 0001      Fine Motor Coordination   Other Fine Motor Exercises Addressed in hand manipulation as well as fine pinch. Focus altenated between accuracy and speed depending on activity. Pt with increased difficulty with in hand manipulatio. Pt wtih moderate difficulty with in hand manipulation. Pt unable to manipulate three objecs  and had significant difficulty manipulating 2 objects.                       OT Long Term Goals - 07/15/16 1659      OT LONG TERM GOAL #1   Title Pt will be mod I with HEP for coordination and RUE proximal strength - 08/04/2016   Status On-going     OT LONG TERM GOAL #2   Title Pt will demonstrate improved fine motor coordination as evidenced by decreasing time on 9 hole peg by at least 7 seconds (baseline= 41.22) to assist with functional tasks   Status On-going     OT LONG TERM GOAL #3   Title Pt will report decrease in time in grooming, buttoning, tying for basic ADL's   Status On-going     OT LONG TERM GOAL #4   Title Pt will be able to write at 3 sentence paragraph legibly AE prn   Status On-going     OT LONG TERM GOAL #5   Title Pt will report greater ease in managing earings   Status On-going     OT LONG TERM GOAL #6   Title Pt will report greater ease  in meal prep.    Status On-going     OT LONG TERM GOAL #8   Title Pt will be able to retrieve 2 pound object off overhead shelf x3 without dropping with RUE   Status On-going               Plan - 07/15/16 1659    Clinical Impression Statement Pt progressing toward goals. Pt very motivated to use RUE functionally   Rehab Potential Excellent   OT Frequency 2x / week   OT Duration 4 weeks   OT Treatment/Interventions Self-care/ADL training;Therapeutic exercise;Neuromuscular education;DME and/or AE instruction;Therapeutic activities;Patient/family education   Plan check HEP, gentle stengthenign for proximal strength of RUE   Consulted and Agree with Plan of Care Patient      Patient will benefit from skilled therapeutic intervention in order to improve the following deficits and impairments:  Decreased coordination, Decreased strength, Impaired UE functional use, Impaired tone  Visit Diagnosis: Muscle weakness (generalized)  Hemiplegia of right dominant side due to cerebrovascular disease,  unspecified cerebrovascular disease type, unspecified hemiplegia type ([place])  Other lack of coordination    Problem List Patient Active Problem List   Diagnosis Date Noted  . Right arm weakness 06/27/2016  . Right arm numbness 06/27/2016  . Deviated septum 06/27/2016  . Disorder of ethmoidal sinus 06/27/2016  . Acute ischemic stroke ([place])   . Dysarthria   . Paroxysmal SVT (supraventricular tachycardia) ([place]) 05/21/2016  . Angiopathy, peripheral ([place]) 01/07/2015  . Cloudy posterior capsule 06/01/2013  . Pseudoaphakia 05/02/2013  . Malaise and fatigue 02/16/2013  . Cataract, nuclear 01/28/2012  . Palpitations 02/17/2011  . Claudication ([place]) 02/17/2011  . Tobacco abuse 02/17/2011    Quay Burow, OTR/L 07/15/2016, 5:01 PM  [facility] [address] [place], [place], [address] Phone: [phone]   Fax:  [phone]  Name: Regina James MRN: [id number] Date of Birth: 27-Dec-1933

## 2016-07-19 ENCOUNTER — Ambulatory Visit: Payer: Medicare Other | Admitting: Occupational Therapy

## 2016-07-20 ENCOUNTER — Other Ambulatory Visit: Payer: Self-pay | Admitting: *Deleted

## 2016-07-20 DIAGNOSIS — I6523 Occlusion and stenosis of bilateral carotid arteries: Secondary | ICD-10-CM

## 2016-07-21 ENCOUNTER — Ambulatory Visit: Payer: Medicare Other | Admitting: Occupational Therapy

## 2016-07-21 ENCOUNTER — Encounter (HOSPITAL_COMMUNITY): Payer: Medicare Other

## 2016-07-21 ENCOUNTER — Encounter: Payer: Medicare Other | Admitting: Vascular Surgery

## 2016-07-21 ENCOUNTER — Encounter: Payer: Self-pay | Admitting: Occupational Therapy

## 2016-07-21 DIAGNOSIS — M6281 Muscle weakness (generalized): Secondary | ICD-10-CM

## 2016-07-21 DIAGNOSIS — I69951 Hemiplegia and hemiparesis following unspecified cerebrovascular disease affecting right dominant side: Secondary | ICD-10-CM | POA: Diagnosis not present

## 2016-07-21 DIAGNOSIS — R278 Other lack of coordination: Secondary | ICD-10-CM

## 2016-07-21 NOTE — Therapy (Signed)
[facility] [address] [place], [place], [address] Phone: [phone]   Fax:  [phone]  Occupational Therapy Treatment  Patient Details  Name: Regina James MRN: [id number] Date of Birth: 1934-01-16 Referring Provider: Dr. Erlinda Hong  Encounter Date: 07/21/2016      OT End of Session - 07/21/16 1652    Visit Number 4   Number of Visits 8   Date for OT Re-Evaluation 12/02/16   Authorization Type Medicare will need G code and PN every 10th visit   Authorization Time Period 60 days   OT Start Time 1535   OT Stop Time 1617   OT Time Calculation (min) 42 min   Activity Tolerance Patient tolerated treatment well   Behavior During Therapy [facility] for tasks assessed/performed      Past Medical History:  Diagnosis Date  . Cancer ([place])    basal cell carcinoma  . History of PSVT (paroxysmal supraventricular tachycardia)    Reported back as far as 2010.  Marland Kitchen Palpitations     Past Surgical History:  Procedure Laterality Date  . APPENDECTOMY    . CHOLECYSTECTOMY    . ROTATOR CUFF REPAIR    . US ECHOCARDIOGRAPHY  01/09/2009   EF 55-60%  . WRIST FRACTURE SURGERY      There were no vitals filed for this visit.      Subjective Assessment - 07/21/16 1538    Subjective  [name] gave me some circles to trace and that was hard.  I can hold apen now, and begin to write my name.     Pertinent History see epic - pt with multi infract in L MCA and L PCA territory   Patient Stated Goals I havet trouble with the small things using my hand and hand writing   Currently in Pain? No/denies   Pain Score 0-No pain                      OT Treatments/Exercises (OP) - 07/21/16 0001      ADLs   Cooking Patient has returned to cooking - today she prepared a recipe requiring use of oven, stovetop, chopping and measuring.       Fine Motor Coordination   Other Fine Motor Exercises Reviewed home exercise program.  Patient still  struggling with accuracy- broke down ball exercises to improve consistency of performance.     Other Fine Motor Exercises Patient is writing now at sentence level but legibility decreases as she moves toward right side of page.  Increasing size of letters, marginally helpful at best.      Neurological Re-education Exercises   Other Exercises 1 Addressed simple strengthening for proximal right UE.  Patient with history of rotator cuff tear in this shoulder- and poor biomechanics prior to stroke.  Patient able to do low reach and abduction exercise with yellow resistanc eband in clinic - fatigue after 5 repetitions, and then poor form.  Unable to send as HEP until further instruction.                  OT Education - 07/21/16 1651    Education provided Yes   Education Details Reviewed HEP for Fine Motor Coordination   Person(s) Educated Patient   Methods Explanation;Demonstration;Handout   Comprehension Verbalized understanding;Returned demonstration             OT Long Term Goals - 07/21/16 1540      OT LONG TERM GOAL #3  Title Pt will report decrease in time in grooming, buttoning, tying for basic ADL's   Status Achieved     OT LONG TERM GOAL #5   Title Pt will report greater ease in managing earings   Status Achieved     OT LONG TERM GOAL #6   Title Pt will report greater ease in meal prep.    Status Achieved               Plan - 07/21/16 1652    Clinical Impression Statement Patient showing steady improvement toward functional use of right UE   Rehab Potential Excellent   OT Frequency 2x / week   OT Duration 4 weeks   OT Treatment/Interventions Self-care/ADL training;Therapeutic exercise;Neuromuscular education;DME and/or AE instruction;Therapeutic activities;Patient/family education   Plan HEP for proximal strengthening   Consulted and Agree with Plan of Care Patient      Patient will benefit from skilled therapeutic intervention in order to improve  the following deficits and impairments:  Decreased coordination, Decreased strength, Impaired UE functional use, Impaired tone  Visit Diagnosis: Muscle weakness (generalized)  Other lack of coordination    Problem List Patient Active Problem List   Diagnosis Date Noted  . Right arm weakness 06/27/2016  . Right arm numbness 06/27/2016  . Deviated septum 06/27/2016  . Disorder of ethmoidal sinus 06/27/2016  . Acute ischemic stroke ([place])   . Dysarthria   . Paroxysmal SVT (supraventricular tachycardia) ([place]) 05/21/2016  . Angiopathy, peripheral ([place]) 01/07/2015  . Cloudy posterior capsule 06/01/2013  . Pseudoaphakia 05/02/2013  . Malaise and fatigue 02/16/2013  . Cataract, nuclear 01/28/2012  . Palpitations 02/17/2011  . Claudication ([place]) 02/17/2011  . Tobacco abuse 02/17/2011    Mariah Milling 07/21/2016, 4:53 PM  [facility] [address] [place], [place], [address] Phone: [phone]   Fax:  [phone]  Name: Regina James MRN: [id number] Date of Birth: [birth year]

## 2016-07-27 ENCOUNTER — Ambulatory Visit: Payer: Medicare Other | Admitting: Occupational Therapy

## 2016-07-27 ENCOUNTER — Encounter: Payer: Self-pay | Admitting: Occupational Therapy

## 2016-07-27 DIAGNOSIS — I69951 Hemiplegia and hemiparesis following unspecified cerebrovascular disease affecting right dominant side: Secondary | ICD-10-CM | POA: Diagnosis not present

## 2016-07-27 DIAGNOSIS — M6281 Muscle weakness (generalized): Secondary | ICD-10-CM | POA: Diagnosis not present

## 2016-07-27 DIAGNOSIS — R278 Other lack of coordination: Secondary | ICD-10-CM

## 2016-07-27 NOTE — Therapy (Signed)
[facility] [address] [place], [place], [address] Phone: [phone]   Fax:  [phone]  Occupational Therapy Treatment  Patient Details  Name: Regina James MRN: [id number] Date of Birth: [birth year] Referring Provider: Dr. Erlinda Hong  Encounter Date: 07/27/2016      OT End of Session - 07/27/16 1637    Visit Number 5   Number of Visits 8   Date for OT Re-Evaluation 09/01/16   Authorization Type Medicare will need G code and PN every 10th visit   Authorization Time Period 60 days   Authorization - Visit Number 5   Authorization - Number of Visits 10   OT Start Time 1318   OT Stop Time 1359   OT Time Calculation (min) 41 min   Activity Tolerance Patient tolerated treatment well      Past Medical History:  Diagnosis Date  . Cancer ([place])    basal cell carcinoma  . History of PSVT (paroxysmal supraventricular tachycardia)    Reported back as far as 2010.  Marland Kitchen Palpitations     Past Surgical History:  Procedure Laterality Date  . APPENDECTOMY    . CHOLECYSTECTOMY    . ROTATOR CUFF REPAIR    . US ECHOCARDIOGRAPHY  01/09/2009   EF 55-60%  . WRIST FRACTURE SURGERY      There were no vitals filed for this visit.      Subjective Assessment - 07/27/16 1319    Subjective  its hard to find time to do my exercises   Pertinent History see epic - pt with multi infract in L MCA and L PCA territory   Patient Stated Goals I havet trouble with the small things using my hand and hand writing   Currently in Pain? No/denies                      OT Treatments/Exercises (OP) - 07/27/16 1632      ADLs   ADL Comments Reviewed goals -see goal update. Progress shared with pt. Pt states she is back to doing most things at home however it is still very difficult to write. Pt has not been practicing at home. Discussed ways to incorporate into  her day to improve compliance.       Shoulder Exercises: Seated   Other  Seated Exercises Instructed pt in theraband home program for gentle strengthening for RUE.  Pt required practice, repetition and cues however by end of session pt able to safely return demonstrate all exercises. Exercised modified given pt's prior history of R shoulder issues and pt able to verbalize understanding. All information given in wirtten form and pt instructed to do first exercise in front of mirror to check posture and form.  Pt had no pain with entire HEP and was instructed to stop program and alert therapist should she develop pain. Pt verbalized understanding.                  OT Education - 07/27/16 1637    Education provided Yes   Education Details theraband program with yellow band   Person(s) Educated Patient   Methods Explanation;Demonstration;Verbal cues;Handout;Tactile cues   Comprehension Verbalized understanding;Returned demonstration             OT Long Term Goals - 07/27/16 1628      OT LONG TERM GOAL #1   Title Pt will be mod I with HEP for coordination and RUE proximal strength - 08/04/2016   Status  Achieved     OT LONG TERM GOAL #2   Title Pt will demonstrate improved fine motor coordination as evidenced by decreasing time on 9 hole peg by at least 7 seconds (baseline= 41.22) to assist with functional tasks   Status On-going     OT LONG TERM GOAL #3   Title Pt will report decrease in time in grooming, buttoning, tying for basic ADL's   Status Achieved     OT LONG TERM GOAL #4   Title Pt will be able to write at 3 sentence paragraph legibly AE prn   Status On-going     OT LONG TERM GOAL #5   Title Pt will report greater ease in managing earings   Status Achieved     OT LONG TERM GOAL #6   Title Pt will report greater ease in meal prep.    Status Achieved               Plan - 07/27/16 1629    Clinical Impression Statement Pt is working toward goals.  Pt states that her use of her RUE is improving functionally at home however she is  still struggling to write. Pt has been inconsistently compliant with HEP- reinforced importance of compliance to meet goals. Pt verbalized understanding.    Rehab Potential Excellent   OT Frequency 2x / week   OT Duration 4 weeks   OT Treatment/Interventions Self-care/ADL training;Therapeutic exercise;Neuromuscular education;DME and/or AE instruction;Therapeutic activities;Patient/family education   Plan check HEP for proximal strengthening, address writing, coordinaton and grip strength   Consulted and Agree with Plan of Care Patient      Patient will benefit from skilled therapeutic intervention in order to improve the following deficits and impairments:  Decreased coordination, Decreased strength, Impaired UE functional use, Impaired tone  Visit Diagnosis: Muscle weakness (generalized)  Other lack of coordination    Problem List Patient Active Problem List   Diagnosis Date Noted  . Right arm weakness 06/27/2016  . Right arm numbness 06/27/2016  . Deviated septum 06/27/2016  . Disorder of ethmoidal sinus 06/27/2016  . Acute ischemic stroke ([place])   . Dysarthria   . Paroxysmal SVT (supraventricular tachycardia) ([place]) 05/21/2016  . Angiopathy, peripheral ([place]) 01/07/2015  . Cloudy posterior capsule 06/01/2013  . Pseudoaphakia 05/02/2013  . Malaise and fatigue 02/16/2013  . Cataract, nuclear 01/28/2012  . Palpitations 02/17/2011  . Claudication ([place]) 02/17/2011  . Tobacco abuse 02/17/2011    Quay Burow, OTR/L 07/27/2016, 4:39 PM  [facility] [address] [place], [place], [address] Phone: [phone]   Fax:  [phone]  Name: KELISE KUCH MRN: [id number] Date of Birth: [birth year]

## 2016-07-28 ENCOUNTER — Other Ambulatory Visit: Payer: Self-pay | Admitting: Cardiology

## 2016-07-28 NOTE — Telephone Encounter (Signed)
Pt was put on these medications in the hospital, atorvastatin 20 mg and clopidogrel 75 mg tablets. Would Dr.Harding like to refill this medication? Pt does not have an appt. Please advise

## 2016-07-29 ENCOUNTER — Encounter: Payer: Medicare Other | Admitting: Occupational Therapy

## 2016-07-30 NOTE — Telephone Encounter (Signed)
Refilled for 6 months  ,patient will be seeing Dr Debara Pickett in 2019

## 2016-08-02 ENCOUNTER — Ambulatory Visit: Payer: Medicare Other | Admitting: Occupational Therapy

## 2016-08-03 ENCOUNTER — Ambulatory Visit: Payer: Medicare Other | Admitting: Occupational Therapy

## 2016-08-04 DIAGNOSIS — H353131 Nonexudative age-related macular degeneration, bilateral, early dry stage: Secondary | ICD-10-CM | POA: Diagnosis not present

## 2016-08-04 DIAGNOSIS — Z961 Presence of intraocular lens: Secondary | ICD-10-CM | POA: Diagnosis not present

## 2016-08-05 ENCOUNTER — Encounter: Payer: Self-pay | Admitting: Occupational Therapy

## 2016-08-05 ENCOUNTER — Encounter: Payer: Medicare Other | Admitting: Occupational Therapy

## 2016-08-05 ENCOUNTER — Ambulatory Visit: Payer: Medicare Other | Attending: Internal Medicine | Admitting: Occupational Therapy

## 2016-08-05 DIAGNOSIS — R278 Other lack of coordination: Secondary | ICD-10-CM | POA: Diagnosis not present

## 2016-08-05 DIAGNOSIS — M6281 Muscle weakness (generalized): Secondary | ICD-10-CM | POA: Insufficient documentation

## 2016-08-05 NOTE — Therapy (Signed)
[facility] [place], [place], [address] Phone: [phone]   Fax:  [phone]  Occupational Therapy Treatment  Patient Details  Name: Regina James MRN: [id number] Date of Birth: 06/27/1933 Referring Provider: Dr. Erlinda Hong  Encounter Date: 08/05/2016      OT End of Session - 08/05/16 1203    Visit Number 6   Number of Visits 8   Date for OT Re-Evaluation 09/01/16   Authorization Type Medicare will need G code and PN every 10th visit   Authorization Time Period 60 days   Authorization - Visit Number 6   Authorization - Number of Visits 10   OT Start Time 0932   OT Stop Time 1001   OT Time Calculation (min) 29 min   Activity Tolerance Patient tolerated treatment well      Past Medical History:  Diagnosis Date  . Cancer ([place])    basal cell carcinoma  . History of PSVT (paroxysmal supraventricular tachycardia)    Reported back as far as 2010.  Marland Kitchen Palpitations     Past Surgical History:  Procedure Laterality Date  . APPENDECTOMY    . CHOLECYSTECTOMY    . ROTATOR CUFF REPAIR    . US ECHOCARDIOGRAPHY  01/09/2009   EF 55-60%  . WRIST FRACTURE SURGERY      There were no vitals filed for this visit.      Subjective Assessment - 08/05/16 0940    Subjective  I didn't have any time to do my exercises.    Pertinent History see epic - pt with multi infract in L MCA and L PCA territory   Patient Stated Goals I have trouble with the small things using my hand and hand writing   Currently in Pain? No/denies                      OT Treatments/Exercises (OP) - 08/05/16 0001      ADLs   Writing Addressed writing - pt able to write at 3-4 sentence paragraph level with emphasis on going slow and using big letters which allows for 100% legibility. Pt reports she is extremely pleased with progress. Check remaining goals - see goal update. Pt has met all goals and is mod I with HEP; pt feel ready  for discharge.                      OT Long Term Goals - 08/05/16 1202      OT LONG TERM GOAL #1   Title Pt will be mod I with HEP for coordination and RUE proximal strength - 08/04/2016   Status Achieved     OT LONG TERM GOAL #2   Title Pt will demonstrate improved fine motor coordination as evidenced by decreasing time on 9 hole peg by at least 7 seconds (baseline= 41.22) to assist with functional tasks   Status Achieved  22.74     OT LONG TERM GOAL #3   Title Pt will report decrease in time in grooming, buttoning, tying for basic ADL's   Status Achieved     OT LONG TERM GOAL #4   Title Pt will be able to write at 3 sentence paragraph legibly AE prn   Status Achieved     OT LONG TERM GOAL #5   Title Pt will report greater ease in managing earings   Status Achieved     OT LONG TERM GOAL #6   Title  Pt will report greater ease in meal prep.    Status Achieved               Plan - 08/24/2016 1203    Clinical Impression Statement PT has met all LTG's and is ready for discharge.    Rehab Potential Excellent   OT Frequency 2x / week   OT Duration 4 weeks   OT Treatment/Interventions Self-care/ADL training;Therapeutic exercise;Neuromuscular education;DME and/or AE instruction;Therapeutic activities;Patient/family education   Plan d/c from OT   Consulted and Agree with Plan of Care Patient      Patient will benefit from skilled therapeutic intervention in order to improve the following deficits and impairments:  Decreased coordination, Decreased strength, Impaired UE functional use, Impaired tone  Visit Diagnosis: Muscle weakness (generalized)  Other lack of coordination      G-Codes - 08/24/16 1204    Functional Assessment Tool Used (Outpatient only) 9 hole peg, skilled clinical observation   Functional Limitation Carrying, moving and handling objects   Carrying, Moving and Handling Objects Current Status (W9794) At least 1 percent but less than 20  percent impaired, limited or restricted   Carrying, Moving and Handling Objects Goal Status (I0165) At least 1 percent but less than 20 percent impaired, limited or restricted   Carrying, Moving and Handling Objects Discharge Status [id number]) At least 1 percent but less than 20 percent impaired, limited or restricted      Problem List Patient Active Problem List   Diagnosis Date Noted  . Right arm weakness 06/27/2016  . Right arm numbness 06/27/2016  . Deviated septum 06/27/2016  . Disorder of ethmoidal sinus 06/27/2016  . Acute ischemic stroke ([place])   . Dysarthria   . Paroxysmal SVT (supraventricular tachycardia) ([place]) 05/21/2016  . Angiopathy, peripheral ([place]) 01/07/2015  . Cloudy posterior capsule 06/01/2013  . Pseudoaphakia 05/02/2013  . Malaise and fatigue 02/16/2013  . Cataract, nuclear 01/28/2012  . Palpitations 02/17/2011  . Claudication ([place]) 02/17/2011  . Tobacco abuse 02/17/2011   OCCUPATIONAL THERAPY DISCHARGE SUMMARY  Visits from Start of Care: 6  Current functional level related to goals / functional outcomes: See above   Remaining deficits: Mild motor planning deficits   Education / Equipment: HEP Plan: Patient agrees to discharge.  Patient goals were met. Patient is being discharged due to meeting the stated rehab goals.  ?????      Quay Burow, OTR/L 08/24/16, 12:04 PM  [facility] [address] [place], [place], [address] Phone: [phone]   Fax:  [phone]  Name: Regina James MRN: [id number] Date of Birth: 08/27/1933

## 2016-08-10 ENCOUNTER — Encounter: Payer: Self-pay | Admitting: Diagnostic Neuroimaging

## 2016-08-10 ENCOUNTER — Ambulatory Visit (INDEPENDENT_AMBULATORY_CARE_PROVIDER_SITE_OTHER): Payer: Medicare Other | Admitting: Diagnostic Neuroimaging

## 2016-08-10 ENCOUNTER — Encounter (INDEPENDENT_AMBULATORY_CARE_PROVIDER_SITE_OTHER): Payer: Self-pay

## 2016-08-10 ENCOUNTER — Encounter: Payer: Medicare Other | Admitting: Occupational Therapy

## 2016-08-10 VITALS — BP 104/68 | HR 64 | Ht 66.5 in | Wt 161.0 lb

## 2016-08-10 DIAGNOSIS — I63412 Cerebral infarction due to embolism of left middle cerebral artery: Secondary | ICD-10-CM

## 2016-08-10 DIAGNOSIS — I639 Cerebral infarction, unspecified: Secondary | ICD-10-CM

## 2016-08-10 DIAGNOSIS — I63432 Cerebral infarction due to embolism of left posterior cerebral artery: Secondary | ICD-10-CM

## 2016-08-10 NOTE — Progress Notes (Signed)
[facility]  PATIENT: Regina James DOB: Mar 22, 1934  REFERRING CLINICIAN: Hospital follow up / Eulogio Bear  HISTORY FROM: patient and chart review REASON FOR VISIT: new consult    HISTORICAL  CHIEF COMPLAINT:  Chief Complaint  Patient presents with  . Cerebrovascular Accident    rm 7, Dr Erlinda Hong, "released from OT last week, doing well"  . Follow-up    hospital FU    HISTORY OF PRESENT ILLNESS:   83 year old female here for evaluation of stroke.Patient has history of hypertension, hyperlipidemia, tobacco abuse.  Patient presented to hospital with right arm numbness and weakness. Patient was admitted for stroke workup. Patient's fundus have multiple ischemic infarctions in the left frontal, parietal and occipital region. Patient was found to have aortic aneurysmal dilation of the ascending aorta and penetrating atheromatous ulcer at the aortic arch. Patient also found to have left subclavian artery stenosis of 80%. Also with left internal carotid artery stenosis of 40-50%.  Patient was treated with maximal medical therapy. She was advised to have TEE and possible implanted loop recorder as an outpatient. This has not been set up yet.  Since hospital discharge patient is doing well. She has completed occupational therapy. She is tolerating her medications. Patient states that she has quit smoking as well. Feels almost back to normal. Minor issues with handwriting.    REVIEW OF SYSTEMS: Full 14 system review of systems performed and negative with exception of: Easy bruising easy bleeding.   ALLERGIES: Allergies  Allergen Reactions  . Morphine And Related Swelling  . Levofloxacin Anxiety    Double vision    HOME MEDICATIONS: Outpatient Medications Prior to Visit  Medication Sig Dispense Refill  . atorvastatin (LIPITOR) 20 MG tablet TAKE 1 TABLET AT 6PM. 30 tablet 6  . clopidogrel (PLAVIX) 75 MG tablet TAKE 1 TABLET ONCE DAILY. 30 tablet 6  . Doxylamine  Succinate, Sleep, (SLEEP AID PO) Take 1 tablet by mouth at bedtime.     . metoprolol succinate (TOPROL-XL) 100 MG 24 hr tablet Take  0.5 tablet ( 50 mg total) once daily 45 tablet 3  . Multiple Vitamin (MULTIVITAMIN) tablet Take 1 tablet by mouth daily.      Vladimir Faster Glycol-Propyl Glycol (SYSTANE OP) Place 1 drop into both eyes daily as needed. For dry eyes     No facility-administered medications prior to visit.     PAST MEDICAL HISTORY: Past Medical History:  Diagnosis Date  . Atrial fibrillation ([place])   . Cancer ([place])    basal cell carcinoma  . History of PSVT (paroxysmal supraventricular tachycardia)    Reported back as far as 2010.  Marland Kitchen Palpitations   . Stroke [facility])     PAST SURGICAL HISTORY: Past Surgical History:  Procedure Laterality Date  . APPENDECTOMY  1993  . CHOLECYSTECTOMY  1996  . ROTATOR CUFF REPAIR Right 1990  . US ECHOCARDIOGRAPHY  01/09/2009   EF 55-60%  . WRIST FRACTURE SURGERY      FAMILY HISTORY: Family History  Problem Relation Age of Onset  . Cancer Father     prostate  . Diabetes Father   . Varicose Veins Mother   . Cardiomyopathy Mother   . Kidney cancer Brother   . Arrhythmia Brother     SOCIAL HISTORY:  Social History   Social History  . Marital status: Married    Spouse name: Wouter  . Number of children: 3  . Years of education: 14   Occupational History  .  retired, travel Music therapist   Social History Main Topics  . Smoking status: Former Smoker    Packs/day: 0.25    Years: 45.00    Types: Cigarettes    Quit date: 06/10/2016  . Smokeless tobacco: Never Used  . Alcohol use 4.2 oz/week    7 Glasses of wine per week     Comment: 1 glass wine daily  . Drug use: No  . Sexual activity: Not on file   Other Topics Concern  . Not on file   Social History Narrative   Lives at home w/husband   Caffeine- coffee, 2 cups daily     PHYSICAL EXAM  GENERAL EXAM/CONSTITUTIONAL: Vitals:  Vitals:   08/10/16 1109  BP: 104/68    Pulse: 64  Weight: 161 lb (73 kg)  Height: 5' 6.5" (1.689 m)     Body mass index is 25.6 kg/m.  Visual Acuity Screening   Right eye Left eye Both eyes  Without correction: 20/40 20/70   With correction:        Patient is in no distress; well developed, nourished and groomed; neck is supple  CARDIOVASCULAR:  Examination of carotid arteries is normal; no carotid bruits  Regular rate and rhythm, no murmurs  Examination of peripheral vascular system by observation and palpation is --> NOTABLE FOR DECREASED LEFT RADIAL PULSE  POSITIVE LEFT SUBCLAVIAN BRUIT  EYES:  Ophthalmoscopic exam of optic discs and posterior segments is normal; no papilledema or hemorrhages  MUSCULOSKELETAL:  Gait, strength, tone, movements noted in Neurologic exam below  NEUROLOGIC: MENTAL STATUS:  No flowsheet data found.  awake, alert, oriented to person, place and time  recent and remote memory intact  normal attention and concentration  language fluent, comprehension intact, naming intact,   fund of knowledge appropriate  CRANIAL NERVE:   2nd - no papilledema on fundoscopic exam  2nd, 3rd, 4th, 6th - pupils equal and reactive to light, visual fields full to confrontation, extraocular muscles intact, no nystagmus  5th - facial sensation symmetric  7th - facial strength symmetric  8th - hearing intact  9th - palate elevates symmetrically, uvula midline  11th - shoulder shrug symmetric  12th - tongue protrusion midline  MOTOR:   normal bulk and tone, full strength in the BUE, BLE  EXCEPT SUBTLE RIGHT DELTOID WEAKNESS (4+)  SENSORY:   normal and symmetric to light touch, temperature, vibration  COORDINATION:   finger-nose-finger, fine finger movements normal  REFLEXES:   deep tendon reflexes TRACE and symmetric  GAIT/STATION:   narrow based gait; able to walk tandem; romberg is negative    DIAGNOSTIC DATA (LABS, IMAGING, TESTING) - I reviewed patient  records, labs, notes, testing and imaging myself where available.  Lab Results  Component Value Date   WBC 8.5 06/27/2016   HGB 15.3 (H) 06/27/2016   HCT 45.3 06/27/2016   MCV 96.6 06/27/2016   PLT 286 06/27/2016      Component Value Date/Time   NA 142 06/27/2016 1059   K 4.3 06/27/2016 1059   CL 108 06/27/2016 1059   CO2 23 06/27/2016 1059   GLUCOSE 96 06/27/2016 1059   BUN 11 06/27/2016 1059   CREATININE 0.87 06/27/2016 1059   CALCIUM 9.1 06/27/2016 1059   GFRNONAA >60 06/27/2016 1059   GFRAA >60 06/27/2016 1059   Lab Results  Component Value Date   CHOL 167 06/28/2016   HDL 31 (L) 06/28/2016   LDLCALC 95 06/28/2016   TRIG 206 (H) 06/28/2016   CHOLHDL 5.4  06/28/2016   Lab Results  Component Value Date   HGBA1C 5.6 06/28/2016   No results found for: [facility] Lab Results  Component Value Date   TSH 3.663 Test methodology is 3rd generation TSH 03/21/2007    08/27/16 MRI brain [I reviewed images myself and agree with interpretation. Suspect cardio-aortic embolic etiology. -VRP]  1. Widely scattered but small, mostly cortically based, acute infarcts in the posterior Left MCA and the superior Left PCA territories. 2. No associated hemorrhage or mass effect. 3. Otherwise largely stable since 2007 and unremarkable for age noncontrast MRI appearance of the brain.  06/27/16 CTA NECK IMPRESSION: 1. Atheromatous plaque about the carotid bifurcations bilaterally with associated stenosis of up to 30% on the right and 40-50% on the left. 2. Aneurysmal dilatation of the ascending aorta up to 4.6 cm. Superimposed penetrating atheromatous ulcer at the aortic arch.  3. Atheromatous narrowing of approximately 40% at the origin of the great vessels. 4. Severe 80% left subclavian artery stenosis as above.  06/27/16 CTA HEAD IMPRESSION: 1. Negative CTA for large vessel occlusion. 2. Mild for age plaque at the carotid siphons without flow-limiting stenosis. Otherwise widely patent  anterior and posterior circulation. 3. Basilar artery supplied via at the dominant left vertebral artery. Hypoplastic right vertebral artery terminates in PICA.     ASSESSMENT AND PLAN  83 y.o. year old female here with multiple ischemic infarctions in the left MCA and left PCA territories, concerning for embolic etiology. Possible sources include aortic arch atheromatous ulcer versus paroxysmal atrial fibrillation. Patient needs TEE and implanted loop recorder for further workup.  Dx:  1. Cerebrovascular accident (CVA) due to embolism of left middle cerebral artery ([place])   2. Cerebrovascular accident (CVA) due to embolism of left posterior cerebral artery (H[facility])      PLAN:  - refer to outpatient cardiology to setup TEE and possible loop recorder  - continue plavix, statin, BP control  - follow up with Dr. Oneida Alar (vascular surgery clinic) re: left subclavian stenosis  - follow up with Dr. Erlinda Hong in stroke clinic 6 months  Orders Placed This Encounter  Procedures  . Ambulatory referral to Cardiac Electrophysiology   Return in about 6 months (around 02/10/2017) for with Dr. Erlinda Hong in stroke clinic.  I reviewed images, labs, notes, records myself. I summarized findings and reviewed with patient, for this high risk condition (stroke) requiring high complexity decision making.     Penni Bombard, MD 0/0/9381, 82:99 AM Certified in Neurology, Neurophysiology and Neuroimaging  [facility] [address] [place], [place] [address] [phone]

## 2016-08-10 NOTE — Patient Instructions (Signed)
-   follow up outpatient cardiology to setup TEE and possible loop recorder  - continue plavix, statin, BP control  - follow up with Dr. Oneida Alar (vascular surgery clinic) re: left subclavian stenosis  - follow up with Dr. Erlinda Hong in stroke clinic 6 months

## 2016-08-12 ENCOUNTER — Encounter: Payer: Medicare Other | Admitting: Occupational Therapy

## 2016-08-20 ENCOUNTER — Encounter: Payer: Self-pay | Admitting: Vascular Surgery

## 2016-08-23 ENCOUNTER — Telehealth: Payer: Self-pay | Admitting: *Deleted

## 2016-08-23 NOTE — Telephone Encounter (Signed)
Informed pt that I would follow up with her this week to arrange TEE.  Pt is agreeable to plan.  (received notice for need to scheduled her in the last week or so.  Received info from scheduling dept for need to arrange TEE)

## 2016-08-26 ENCOUNTER — Ambulatory Visit (INDEPENDENT_AMBULATORY_CARE_PROVIDER_SITE_OTHER): Payer: Medicare Other | Admitting: Vascular Surgery

## 2016-08-26 ENCOUNTER — Encounter: Payer: Self-pay | Admitting: Vascular Surgery

## 2016-08-26 ENCOUNTER — Ambulatory Visit (HOSPITAL_COMMUNITY)
Admission: RE | Admit: 2016-08-26 | Discharge: 2016-08-26 | Disposition: A | Discharge status: Home / Self Care | Payer: Medicare Other | Source: Ambulatory Visit | Attending: Vascular Surgery | Admitting: Vascular Surgery

## 2016-08-26 VITALS — BP 118/80 | HR 58 | Temp 98.1°F | Resp 16 | Ht 66.5 in | Wt 162.0 lb

## 2016-08-26 DIAGNOSIS — I712 Thoracic aortic aneurysm, without rupture: Secondary | ICD-10-CM

## 2016-08-26 DIAGNOSIS — I771 Stricture of artery: Secondary | ICD-10-CM

## 2016-08-26 DIAGNOSIS — I639 Cerebral infarction, unspecified: Secondary | ICD-10-CM | POA: Diagnosis not present

## 2016-08-26 DIAGNOSIS — I6523 Occlusion and stenosis of bilateral carotid arteries: Secondary | ICD-10-CM | POA: Diagnosis not present

## 2016-08-26 DIAGNOSIS — I7121 Aneurysm of the ascending aorta, without rupture: Secondary | ICD-10-CM

## 2016-08-26 LAB — VAS US CAROTID
LEFT ECA DIAS: -20 cm/s
Left CCA dist dias: 14 cm/s
Left CCA dist sys: 55 cm/s
Left CCA prox dias: 16 cm/s
Left CCA prox sys: 64 cm/s
Left ICA dist dias: -20 cm/s
Left ICA dist sys: -68 cm/s
Left ICA prox dias: -33 cm/s
Left ICA prox sys: -114 cm/s
RIGHT CCA MID DIAS: 14 cm/s
RIGHT ECA DIAS: -12 cm/s
Right CCA prox dias: 14 cm/s
Right CCA prox sys: 92 cm/s
Right cca dist sys: -62 cm/s

## 2016-08-26 NOTE — Progress Notes (Signed)
Referring Physician:  Dr Kathyrn Lass  Patient name: Regina James MRN: [id number] DOB: 23-Dec-1933 Sex: female  REASON FOR CONSULT: Left subclavian stenosis  HPI: Regina James is a 83 y.o. female referred for evaluation of left subclavian artery stenosis. This was incidentally found on a CT angiogram for evaluation of stroke. The patient had a stroke March 25. Her symptoms were right arm weakness and some residual right hand clumsiness. She is currently on Plavix and a statin. She had been on aspirin prior to the event. She also has a history of cardiac arrhythmia. She is scheduled in the near future for a transesophageal echo. She had never had a prior stroke. She is a former smoker and quit after her stroke. She denies any left upper extremity exertional fatigue. She denies any dizziness.   Past Medical History:  Diagnosis Date  . Atrial fibrillation ([place])   . Cancer ([place])    basal cell carcinoma  . History of PSVT (paroxysmal supraventricular tachycardia)    Reported back as far as 2010.  Marland Kitchen Palpitations   . Stroke [facility])    Past Surgical History:  Procedure Laterality Date  . APPENDECTOMY  1993  . CHOLECYSTECTOMY  1996  . ROTATOR CUFF REPAIR Right 1990  . US ECHOCARDIOGRAPHY  01/09/2009   EF 55-60%  . WRIST FRACTURE SURGERY      Family History  Problem Relation Age of Onset  . Cancer Father        prostate  . Diabetes Father   . Varicose Veins Mother   . Cardiomyopathy Mother   . Kidney cancer Brother   . Arrhythmia Brother     SOCIAL HISTORY: Social History   Social History  . Marital status: Married    Spouse name: Wouter  . Number of children: 3  . Years of education: 14   Occupational History  .      retired, travel Music therapist   Social History Main Topics  . Smoking status: Former Smoker    Packs/day: 0.25    Years: 45.00    Types: Cigarettes    Quit date: 06/10/2016  . Smokeless tobacco: Never Used  . Alcohol use 4.2 oz/week    7 Glasses of wine per  week     Comment: 1 glass wine daily  . Drug use: No  . Sexual activity: Not on file   Other Topics Concern  . Not on file   Social History Narrative   Lives at home w/husband   Caffeine- coffee, 2 cups daily    Allergies  Allergen Reactions  . Morphine And Related Swelling  . Levofloxacin Anxiety    Double vision    Current Outpatient Prescriptions  Medication Sig Dispense Refill  . guaiFENesin (MUCINEX) 600 MG 12 hr tablet Take by mouth 2 (two) times daily.    Marland Kitchen atorvastatin (LIPITOR) 20 MG tablet TAKE 1 TABLET AT 6PM. 30 tablet 6  . clopidogrel (PLAVIX) 75 MG tablet TAKE 1 TABLET ONCE DAILY. 30 tablet 6  . Doxylamine Succinate, Sleep, (SLEEP AID PO) Take 1 tablet by mouth at bedtime.     . metoprolol succinate (TOPROL-XL) 100 MG 24 hr tablet Take  0.5 tablet ( 50 mg total) once daily 45 tablet 3  . Multiple Vitamin (MULTIVITAMIN) tablet Take 1 tablet by mouth daily.      Vladimir Faster Glycol-Propyl Glycol (SYSTANE OP) Place 1 drop into both eyes daily as needed. For dry eyes     No current facility-administered  medications for this visit.     ROS:   General:  No weight loss, Fever, chills  HEENT: No recent headaches, no nasal bleeding, no visual changes, no sore throat  Neurologic: No dizziness, blackouts, seizures. + recent symptoms of stroke or mini- stroke. No recent episodes of slurred speech, or temporary blindness.  Cardiac: No recent episodes of chest pain/pressure, no shortness of breath at rest.  No shortness of breath with exertion. +history of atrial fibrillation or irregular heartbeat  Vascular: No history of rest pain in feet.  No history of claudication.  No history of non-healing ulcer, No history of DVT   Pulmonary: No home oxygen, no productive cough, no hemoptysis,  No asthma or wheezing  Musculoskeletal:  [ ]  Arthritis, [ ]  Low back pain,  [ ]  Joint pain  Hematologic:No history of hypercoagulable state.  No history of easy bleeding.  No history of  anemia  Gastrointestinal: No hematochezia or melena,  No gastroesophageal reflux, no trouble swallowing  Urinary: [ ]  chronic Kidney disease, [ ]  on HD - [ ]  MWF or [ ]  TTHS, [ ]  Burning with urination, [ ]  Frequent urination, [ ]  Difficulty urinating;   Skin: No rashes  Psychological: No history of anxiety,  No history of depression   Physical Examination  Vitals:   08/26/16 1428 08/26/16 1433  BP: 138/80 118/80  Pulse: (!) 58   Resp: 16   Temp: 98.1 F (36.7 C)   TempSrc: Oral   SpO2: 95%   Weight: 162 lb (73.5 kg)   Height: 5' 6.5" (1.689 m)     Body mass index is 25.76 kg/m.  General:  Alert and oriented, no acute distress HEENT: Normal Neck: No bruit or JVD Pulmonary: Clear to auscultation bilaterally Cardiac: Regular Rate and Rhythm without murmur Abdomen: Soft, non-tender, non-distended, prominent aortic pulsation mid epigastrium, no scars Skin: No rash Extremity Pulses:  2+ radial, brachial, femoral, absent popliteal dorsalis pedis, posterior tibial pulses bilaterally Musculoskeletal: No deformity or edema  Neurologic: Upper and lower extremity motor 5/5 and symmetric  DATA:  I reviewed the patient's recent CT Angio the neck dated 06/27/2016. This showed a 4.6 cm ascending aortic aneurysm. There was also atheromatous debris in this area. There was approximate 40% narrowing of the proximal aspect of the right and left common carotid artery. Both carotid bifurcations were less than 50%. There is an 80% left subclavian artery stenosis distal to the left vertebral artery.  ASSESSMENT:  #1 left subclavian artery stenosis. Patient is asymptomatic from this. The stenosis is distal to the left vertebral artery side did not believe this is going to compromise her posterior circulation. If she develops symptoms of dizziness with exertion with the left arm or left arm exertional fatigue consideration could be given for left subclavian artery stenting. Otherwise if she remains  asymptomatic no intervention necessary. Especially in light of the fact that she has atheromatous debris in her aorta she would be at slightly higher risk of stroke from an intervention. I currently do not believe benefit outweighs risk. I did inform her that she should always have the blood pressure measured in her right upper extremity since her left arm is 30 points lower than her right.  #2 patient with an ascending aortic aneurysm smoking history and prominent epigastric aortic pulsation needs to rule out abdominal aortic aneurysm. We will schedule her for an ultrasound of her abdominal aorta. If this shows normal size aorta then she can follow up on an as-needed  basis.   Ruta Hinds, MD [facility] Office: [phone] Pager: [phone]

## 2016-08-27 ENCOUNTER — Ambulatory Visit (HOSPITAL_COMMUNITY)
Admission: RE | Admit: 2016-08-27 | Discharge: 2016-08-27 | Disposition: A | Discharge status: Home / Self Care | Payer: Medicare Other | Source: Ambulatory Visit | Attending: Vascular Surgery | Admitting: Vascular Surgery

## 2016-08-27 DIAGNOSIS — I7121 Aneurysm of the ascending aorta, without rupture: Secondary | ICD-10-CM

## 2016-08-27 DIAGNOSIS — I712 Thoracic aortic aneurysm, without rupture: Secondary | ICD-10-CM | POA: Insufficient documentation

## 2016-08-27 NOTE — Telephone Encounter (Signed)
Follow up   Pt is calling returning call to [facility].

## 2016-09-13 DIAGNOSIS — Z0000 Encounter for general adult medical examination without abnormal findings: Secondary | ICD-10-CM | POA: Diagnosis not present

## 2016-09-13 DIAGNOSIS — Z131 Encounter for screening for diabetes mellitus: Secondary | ICD-10-CM | POA: Diagnosis not present

## 2016-09-13 DIAGNOSIS — E041 Nontoxic single thyroid nodule: Secondary | ICD-10-CM | POA: Diagnosis not present

## 2016-09-16 DIAGNOSIS — M810 Age-related osteoporosis without current pathological fracture: Secondary | ICD-10-CM | POA: Diagnosis not present

## 2016-09-16 DIAGNOSIS — Z87891 Personal history of nicotine dependence: Secondary | ICD-10-CM | POA: Diagnosis not present

## 2016-09-16 DIAGNOSIS — R918 Other nonspecific abnormal finding of lung field: Secondary | ICD-10-CM | POA: Diagnosis not present

## 2016-09-16 DIAGNOSIS — E041 Nontoxic single thyroid nodule: Secondary | ICD-10-CM | POA: Diagnosis not present

## 2016-09-16 DIAGNOSIS — E782 Mixed hyperlipidemia: Secondary | ICD-10-CM | POA: Diagnosis not present

## 2016-09-16 DIAGNOSIS — Z8673 Personal history of transient ischemic attack (TIA), and cerebral infarction without residual deficits: Secondary | ICD-10-CM | POA: Diagnosis not present

## 2016-09-16 DIAGNOSIS — Z0000 Encounter for general adult medical examination without abnormal findings: Secondary | ICD-10-CM | POA: Diagnosis not present

## 2016-09-17 ENCOUNTER — Other Ambulatory Visit: Payer: Self-pay | Admitting: Family Medicine

## 2016-09-17 DIAGNOSIS — E041 Nontoxic single thyroid nodule: Secondary | ICD-10-CM

## 2016-09-17 DIAGNOSIS — R918 Other nonspecific abnormal finding of lung field: Secondary | ICD-10-CM

## 2016-09-22 ENCOUNTER — Ambulatory Visit
Admission: RE | Admit: 2016-09-22 | Discharge: 2016-09-22 | Disposition: A | Discharge status: Home / Self Care | Payer: Medicare Other | Source: Ambulatory Visit | Attending: Family Medicine | Admitting: Family Medicine

## 2016-09-22 DIAGNOSIS — R918 Other nonspecific abnormal finding of lung field: Secondary | ICD-10-CM

## 2016-09-29 ENCOUNTER — Other Ambulatory Visit: Payer: Medicare Other

## 2016-09-29 DIAGNOSIS — M545 Low back pain: Secondary | ICD-10-CM | POA: Diagnosis not present

## 2016-09-29 DIAGNOSIS — R350 Frequency of micturition: Secondary | ICD-10-CM | POA: Diagnosis not present

## 2016-10-02 DIAGNOSIS — M9903 Segmental and somatic dysfunction of lumbar region: Secondary | ICD-10-CM | POA: Diagnosis not present

## 2016-10-02 DIAGNOSIS — M4315 Spondylolisthesis, thoracolumbar region: Secondary | ICD-10-CM | POA: Diagnosis not present

## 2016-10-02 DIAGNOSIS — M5136 Other intervertebral disc degeneration, lumbar region: Secondary | ICD-10-CM | POA: Diagnosis not present

## 2016-10-02 DIAGNOSIS — M545 Low back pain: Secondary | ICD-10-CM | POA: Diagnosis not present

## 2016-10-04 DIAGNOSIS — M545 Low back pain: Secondary | ICD-10-CM | POA: Diagnosis not present

## 2016-10-04 DIAGNOSIS — M9903 Segmental and somatic dysfunction of lumbar region: Secondary | ICD-10-CM | POA: Diagnosis not present

## 2016-10-04 DIAGNOSIS — M5136 Other intervertebral disc degeneration, lumbar region: Secondary | ICD-10-CM | POA: Diagnosis not present

## 2016-10-04 DIAGNOSIS — M4315 Spondylolisthesis, thoracolumbar region: Secondary | ICD-10-CM | POA: Diagnosis not present

## 2016-10-05 ENCOUNTER — Ambulatory Visit
Admission: RE | Admit: 2016-10-05 | Discharge: 2016-10-05 | Disposition: A | Discharge status: Home / Self Care | Payer: Medicare Other | Source: Ambulatory Visit | Attending: Family Medicine | Admitting: Family Medicine

## 2016-10-05 DIAGNOSIS — M4315 Spondylolisthesis, thoracolumbar region: Secondary | ICD-10-CM | POA: Diagnosis not present

## 2016-10-05 DIAGNOSIS — M9903 Segmental and somatic dysfunction of lumbar region: Secondary | ICD-10-CM | POA: Diagnosis not present

## 2016-10-05 DIAGNOSIS — M545 Low back pain: Secondary | ICD-10-CM | POA: Diagnosis not present

## 2016-10-05 DIAGNOSIS — M5136 Other intervertebral disc degeneration, lumbar region: Secondary | ICD-10-CM | POA: Diagnosis not present

## 2016-10-05 DIAGNOSIS — E042 Nontoxic multinodular goiter: Secondary | ICD-10-CM | POA: Diagnosis not present

## 2016-10-05 DIAGNOSIS — E041 Nontoxic single thyroid nodule: Secondary | ICD-10-CM

## 2016-10-07 DIAGNOSIS — M5136 Other intervertebral disc degeneration, lumbar region: Secondary | ICD-10-CM | POA: Diagnosis not present

## 2016-10-07 DIAGNOSIS — M4315 Spondylolisthesis, thoracolumbar region: Secondary | ICD-10-CM | POA: Diagnosis not present

## 2016-10-07 DIAGNOSIS — M545 Low back pain: Secondary | ICD-10-CM | POA: Diagnosis not present

## 2016-10-07 DIAGNOSIS — M9903 Segmental and somatic dysfunction of lumbar region: Secondary | ICD-10-CM | POA: Diagnosis not present

## 2016-10-12 ENCOUNTER — Ambulatory Visit: Payer: Self-pay | Admitting: Neurology

## 2016-10-20 NOTE — Telephone Encounter (Signed)
Have attempted to reach patient several times and left messages.  Final attempt today - left another message.  (will mail letter if don't hear back from pt)

## 2016-10-31 ENCOUNTER — Emergency Department (HOSPITAL_COMMUNITY): Payer: Medicare Other

## 2016-10-31 ENCOUNTER — Inpatient Hospital Stay (HOSPITAL_COMMUNITY)
Admission: EM | Admit: 2016-10-31 | Discharge: 2016-11-10 | DRG: 037 | Disposition: A | Discharge status: Skilled Nursing Facility | Payer: Medicare Other | Attending: Vascular Surgery | Admitting: Vascular Surgery

## 2016-10-31 ENCOUNTER — Encounter (HOSPITAL_COMMUNITY): Payer: Self-pay | Admitting: Neurology

## 2016-10-31 DIAGNOSIS — R2681 Unsteadiness on feet: Secondary | ICD-10-CM | POA: Diagnosis not present

## 2016-10-31 DIAGNOSIS — I952 Hypotension due to drugs: Secondary | ICD-10-CM | POA: Diagnosis not present

## 2016-10-31 DIAGNOSIS — E782 Mixed hyperlipidemia: Secondary | ICD-10-CM | POA: Diagnosis present

## 2016-10-31 DIAGNOSIS — J9811 Atelectasis: Secondary | ICD-10-CM | POA: Diagnosis not present

## 2016-10-31 DIAGNOSIS — E119 Type 2 diabetes mellitus without complications: Secondary | ICD-10-CM | POA: Diagnosis not present

## 2016-10-31 DIAGNOSIS — I7122 Aneurysm of the aortic arch, without rupture: Secondary | ICD-10-CM | POA: Diagnosis present

## 2016-10-31 DIAGNOSIS — I69391 Dysphagia following cerebral infarction: Secondary | ICD-10-CM | POA: Diagnosis not present

## 2016-10-31 DIAGNOSIS — Y838 Other surgical procedures as the cause of abnormal reaction of the patient, or of later complication, without mention of misadventure at the time of the procedure: Secondary | ICD-10-CM | POA: Diagnosis not present

## 2016-10-31 DIAGNOSIS — D62 Acute posthemorrhagic anemia: Secondary | ICD-10-CM | POA: Diagnosis not present

## 2016-10-31 DIAGNOSIS — I493 Ventricular premature depolarization: Secondary | ICD-10-CM | POA: Diagnosis not present

## 2016-10-31 DIAGNOSIS — I471 Supraventricular tachycardia: Secondary | ICD-10-CM | POA: Diagnosis present

## 2016-10-31 DIAGNOSIS — G8194 Hemiplegia, unspecified affecting left nondominant side: Secondary | ICD-10-CM | POA: Diagnosis not present

## 2016-10-31 DIAGNOSIS — Z9181 History of falling: Secondary | ICD-10-CM | POA: Diagnosis not present

## 2016-10-31 DIAGNOSIS — I63119 Cerebral infarction due to embolism of unspecified vertebral artery: Secondary | ICD-10-CM | POA: Diagnosis not present

## 2016-10-31 DIAGNOSIS — R000 Tachycardia, unspecified: Secondary | ICD-10-CM | POA: Diagnosis present

## 2016-10-31 DIAGNOSIS — R531 Weakness: Secondary | ICD-10-CM | POA: Diagnosis not present

## 2016-10-31 DIAGNOSIS — I639 Cerebral infarction, unspecified: Secondary | ICD-10-CM

## 2016-10-31 DIAGNOSIS — I63412 Cerebral infarction due to embolism of left middle cerebral artery: Secondary | ICD-10-CM | POA: Diagnosis present

## 2016-10-31 DIAGNOSIS — I6930 Unspecified sequelae of cerebral infarction: Secondary | ICD-10-CM | POA: Diagnosis not present

## 2016-10-31 DIAGNOSIS — Z9119 Patient's noncompliance with other medical treatment and regimen: Secondary | ICD-10-CM | POA: Diagnosis not present

## 2016-10-31 DIAGNOSIS — I10 Essential (primary) hypertension: Secondary | ICD-10-CM | POA: Diagnosis present

## 2016-10-31 DIAGNOSIS — Z01818 Encounter for other preprocedural examination: Secondary | ICD-10-CM

## 2016-10-31 DIAGNOSIS — I739 Peripheral vascular disease, unspecified: Secondary | ICD-10-CM | POA: Diagnosis not present

## 2016-10-31 DIAGNOSIS — R0682 Tachypnea, not elsewhere classified: Secondary | ICD-10-CM

## 2016-10-31 DIAGNOSIS — R29704 NIHSS score 4: Secondary | ICD-10-CM | POA: Diagnosis present

## 2016-10-31 DIAGNOSIS — I4891 Unspecified atrial fibrillation: Secondary | ICD-10-CM | POA: Diagnosis present

## 2016-10-31 DIAGNOSIS — I6522 Occlusion and stenosis of left carotid artery: Secondary | ICD-10-CM | POA: Diagnosis present

## 2016-10-31 DIAGNOSIS — R52 Pain, unspecified: Secondary | ICD-10-CM | POA: Diagnosis not present

## 2016-10-31 DIAGNOSIS — I97621 Postprocedural hematoma of a circulatory system organ or structure following other procedure: Secondary | ICD-10-CM | POA: Diagnosis not present

## 2016-10-31 DIAGNOSIS — J9600 Acute respiratory failure, unspecified whether with hypoxia or hypercapnia: Secondary | ICD-10-CM | POA: Diagnosis not present

## 2016-10-31 DIAGNOSIS — Z85828 Personal history of other malignant neoplasm of skin: Secondary | ICD-10-CM | POA: Diagnosis not present

## 2016-10-31 DIAGNOSIS — F17200 Nicotine dependence, unspecified, uncomplicated: Secondary | ICD-10-CM | POA: Diagnosis present

## 2016-10-31 DIAGNOSIS — L7632 Postprocedural hematoma of skin and subcutaneous tissue following other procedure: Secondary | ICD-10-CM | POA: Diagnosis not present

## 2016-10-31 DIAGNOSIS — G464 Cerebellar stroke syndrome: Secondary | ICD-10-CM | POA: Diagnosis not present

## 2016-10-31 DIAGNOSIS — Z9049 Acquired absence of other specified parts of digestive tract: Secondary | ICD-10-CM

## 2016-10-31 DIAGNOSIS — I708 Atherosclerosis of other arteries: Secondary | ICD-10-CM | POA: Diagnosis present

## 2016-10-31 DIAGNOSIS — Z978 Presence of other specified devices: Secondary | ICD-10-CM

## 2016-10-31 DIAGNOSIS — E875 Hyperkalemia: Secondary | ICD-10-CM | POA: Diagnosis not present

## 2016-10-31 DIAGNOSIS — I340 Nonrheumatic mitral (valve) insufficiency: Secondary | ICD-10-CM | POA: Diagnosis not present

## 2016-10-31 DIAGNOSIS — Z48812 Encounter for surgical aftercare following surgery on the circulatory system: Secondary | ICD-10-CM | POA: Diagnosis not present

## 2016-10-31 DIAGNOSIS — T4275XA Adverse effect of unspecified antiepileptic and sedative-hypnotic drugs, initial encounter: Secondary | ICD-10-CM | POA: Diagnosis not present

## 2016-10-31 DIAGNOSIS — F17203 Nicotine dependence unspecified, with withdrawal: Secondary | ICD-10-CM | POA: Diagnosis not present

## 2016-10-31 DIAGNOSIS — I6300 Cerebral infarction due to thrombosis of unspecified precerebral artery: Secondary | ICD-10-CM | POA: Diagnosis not present

## 2016-10-31 DIAGNOSIS — Z8051 Family history of malignant neoplasm of kidney: Secondary | ICD-10-CM

## 2016-10-31 DIAGNOSIS — I638 Other cerebral infarction: Secondary | ICD-10-CM | POA: Diagnosis not present

## 2016-10-31 DIAGNOSIS — E569 Vitamin deficiency, unspecified: Secondary | ICD-10-CM | POA: Diagnosis not present

## 2016-10-31 DIAGNOSIS — Z8673 Personal history of transient ischemic attack (TIA), and cerebral infarction without residual deficits: Secondary | ICD-10-CM | POA: Diagnosis present

## 2016-10-31 DIAGNOSIS — G8190 Hemiplegia, unspecified affecting unspecified side: Secondary | ICD-10-CM | POA: Diagnosis not present

## 2016-10-31 DIAGNOSIS — H04129 Dry eye syndrome of unspecified lacrimal gland: Secondary | ICD-10-CM | POA: Diagnosis not present

## 2016-10-31 DIAGNOSIS — Z9114 Patient's other noncompliance with medication regimen: Secondary | ICD-10-CM

## 2016-10-31 DIAGNOSIS — Y92230 Patient room in hospital as the place of occurrence of the external cause: Secondary | ICD-10-CM | POA: Diagnosis not present

## 2016-10-31 DIAGNOSIS — Z4682 Encounter for fitting and adjustment of non-vascular catheter: Secondary | ICD-10-CM | POA: Diagnosis not present

## 2016-10-31 DIAGNOSIS — R1310 Dysphagia, unspecified: Secondary | ICD-10-CM | POA: Diagnosis not present

## 2016-10-31 DIAGNOSIS — I97821 Postprocedural cerebrovascular infarction during other surgery: Secondary | ICD-10-CM | POA: Diagnosis not present

## 2016-10-31 DIAGNOSIS — Z7902 Long term (current) use of antithrombotics/antiplatelets: Secondary | ICD-10-CM | POA: Diagnosis not present

## 2016-10-31 DIAGNOSIS — I9762 Postprocedural hemorrhage of a circulatory system organ or structure following other procedure: Secondary | ICD-10-CM | POA: Diagnosis not present

## 2016-10-31 DIAGNOSIS — R0689 Other abnormalities of breathing: Secondary | ICD-10-CM | POA: Diagnosis not present

## 2016-10-31 DIAGNOSIS — I6789 Other cerebrovascular disease: Secondary | ICD-10-CM | POA: Diagnosis not present

## 2016-10-31 DIAGNOSIS — Z833 Family history of diabetes mellitus: Secondary | ICD-10-CM

## 2016-10-31 DIAGNOSIS — F5101 Primary insomnia: Secondary | ICD-10-CM | POA: Diagnosis not present

## 2016-10-31 DIAGNOSIS — I69351 Hemiplegia and hemiparesis following cerebral infarction affecting right dominant side: Secondary | ICD-10-CM | POA: Diagnosis not present

## 2016-10-31 DIAGNOSIS — I714 Abdominal aortic aneurysm, without rupture: Secondary | ICD-10-CM | POA: Diagnosis not present

## 2016-10-31 DIAGNOSIS — I6389 Other cerebral infarction: Secondary | ICD-10-CM

## 2016-10-31 DIAGNOSIS — I712 Thoracic aortic aneurysm, without rupture: Secondary | ICD-10-CM | POA: Diagnosis present

## 2016-10-31 DIAGNOSIS — D72829 Elevated white blood cell count, unspecified: Secondary | ICD-10-CM | POA: Diagnosis not present

## 2016-10-31 DIAGNOSIS — E785 Hyperlipidemia, unspecified: Secondary | ICD-10-CM | POA: Diagnosis not present

## 2016-10-31 DIAGNOSIS — Z87891 Personal history of nicotine dependence: Secondary | ICD-10-CM | POA: Diagnosis not present

## 2016-10-31 DIAGNOSIS — I63232 Cerebral infarction due to unspecified occlusion or stenosis of left carotid arteries: Secondary | ICD-10-CM | POA: Diagnosis not present

## 2016-10-31 DIAGNOSIS — I6349 Cerebral infarction due to embolism of other cerebral artery: Secondary | ICD-10-CM | POA: Diagnosis not present

## 2016-10-31 DIAGNOSIS — M6281 Muscle weakness (generalized): Secondary | ICD-10-CM | POA: Diagnosis not present

## 2016-10-31 DIAGNOSIS — I63132 Cerebral infarction due to embolism of left carotid artery: Secondary | ICD-10-CM | POA: Diagnosis not present

## 2016-10-31 DIAGNOSIS — J9690 Respiratory failure, unspecified, unspecified whether with hypoxia or hypercapnia: Secondary | ICD-10-CM | POA: Diagnosis not present

## 2016-10-31 DIAGNOSIS — K5900 Constipation, unspecified: Secondary | ICD-10-CM | POA: Diagnosis not present

## 2016-10-31 DIAGNOSIS — Z4659 Encounter for fitting and adjustment of other gastrointestinal appliance and device: Secondary | ICD-10-CM

## 2016-10-31 DIAGNOSIS — Z111 Encounter for screening for respiratory tuberculosis: Secondary | ICD-10-CM | POA: Diagnosis not present

## 2016-10-31 DIAGNOSIS — Z720 Tobacco use: Secondary | ICD-10-CM | POA: Diagnosis not present

## 2016-10-31 DIAGNOSIS — Z91199 Patient's noncompliance with other medical treatment and regimen due to unspecified reason: Secondary | ICD-10-CM

## 2016-10-31 DIAGNOSIS — G459 Transient cerebral ischemic attack, unspecified: Secondary | ICD-10-CM | POA: Diagnosis not present

## 2016-10-31 DIAGNOSIS — I771 Stricture of artery: Secondary | ICD-10-CM | POA: Diagnosis present

## 2016-10-31 LAB — COMPREHENSIVE METABOLIC PANEL
ALT: 38 U/L (ref 14–54)
AST: 37 U/L (ref 15–41)
Albumin: 3.5 g/dL (ref 3.5–5.0)
Alkaline Phosphatase: 106 U/L (ref 38–126)
Anion gap: 8 (ref 5–15)
BUN: 8 mg/dL (ref 6–20)
CO2: 25 mmol/L (ref 22–32)
Calcium: 9.1 mg/dL (ref 8.9–10.3)
Chloride: 110 mmol/L (ref 101–111)
Creatinine, Ser: 0.75 mg/dL (ref 0.44–1.00)
GFR calc Af Amer: >60 mL/min (ref >60)
GFR calc non Af Amer: >60 mL/min (ref >60)
Glucose, Bld: 98 mg/dL (ref 65–99)
Potassium: 3.9 mmol/L (ref 3.5–5.1)
Sodium: 143 mmol/L (ref 135–145)
Total Bilirubin: 0.8 mg/dL (ref 0.3–1.2)
Total Protein: 7 g/dL (ref 6.5–8.1)

## 2016-10-31 LAB — CBC WITH DIFFERENTIAL/PLATELET
Basophils Absolute: 0.1 10*3/uL (ref 0.0–0.1)
Basophils Relative: 1 %
Eosinophils Absolute: 0.1 10*3/uL (ref 0.0–0.7)
Eosinophils Relative: 1 %
HCT: 42 % (ref 36.0–46.0)
Hemoglobin: 14.1 g/dL (ref 12.0–15.0)
Lymphocytes Relative: 24 %
Lymphs Abs: 2.3 10*3/uL (ref 0.7–4.0)
MCH: 31.3 pg (ref 26.0–34.0)
MCHC: 33.6 g/dL (ref 30.0–36.0)
MCV: 93.3 fL (ref 78.0–100.0)
Monocytes Absolute: 0.9 10*3/uL (ref 0.1–1.0)
Monocytes Relative: 9 %
Neutro Abs: 6.3 10*3/uL (ref 1.7–7.7)
Neutrophils Relative %: 65 %
Platelets: 277 10*3/uL (ref 150–400)
RBC: 4.5 MIL/uL (ref 3.87–5.11)
RDW: 15 % (ref 11.5–15.5)
WBC: 9.6 10*3/uL (ref 4.0–10.5)

## 2016-10-31 LAB — I-STAT TROPONIN, ED: Troponin i, poc: 0.01 ng/mL (ref 0.00–0.08)

## 2016-10-31 LAB — I-STAT CHEM 8, ED
BUN: 9 mg/dL (ref 6–20)
Calcium, Ion: 1.19 mmol/L (ref 1.15–1.40)
Chloride: 108 mmol/L (ref 101–111)
Creatinine, Ser: 0.7 mg/dL (ref 0.44–1.00)
Glucose, Bld: 97 mg/dL (ref 65–99)
HCT: 42 % (ref 36.0–46.0)
Hemoglobin: 14.3 g/dL (ref 12.0–15.0)
Potassium: 4 mmol/L (ref 3.5–5.1)
Sodium: 143 mmol/L (ref 135–145)
TCO2: 25 mmol/L (ref 0–100)

## 2016-10-31 LAB — URINALYSIS, ROUTINE W REFLEX MICROSCOPIC
Bilirubin Urine: NEGATIVE
Glucose, UA: NEGATIVE mg/dL
Hgb urine dipstick: NEGATIVE
Ketones, ur: NEGATIVE mg/dL
Leukocytes, UA: NEGATIVE
Nitrite: NEGATIVE
Protein, ur: NEGATIVE mg/dL
Specific Gravity, Urine: 1.01 (ref 1.005–1.030)
pH: 6 (ref 5.0–8.0)

## 2016-10-31 LAB — ETHANOL: Alcohol, Ethyl (B): <5 mg/dL (ref <5)

## 2016-10-31 LAB — PROTIME-INR
INR: 1.1
Prothrombin Time: 14.3 seconds (ref 11.4–15.2)

## 2016-10-31 LAB — RAPID URINE DRUG SCREEN, HOSP PERFORMED
Amphetamines: NOT DETECTED
Barbiturates: NOT DETECTED
Benzodiazepines: NOT DETECTED
Cocaine: NOT DETECTED
Opiates: NOT DETECTED
Tetrahydrocannabinol: NOT DETECTED

## 2016-10-31 LAB — APTT: aPTT: 39 seconds — ABNORMAL HIGH (ref 24–36)

## 2016-10-31 LAB — CK: Total CK: 208 U/L (ref 38–234)

## 2016-10-31 MED ORDER — METOPROLOL SUCCINATE ER 50 MG PO TB24
50.0000 mg | ORAL_TABLET | Freq: Every day | ORAL | Status: DC
  Administered 2016-11-01 – 2016-11-03 (×3): 50 mg via ORAL
  Filled 2016-10-31 (×4): qty 1

## 2016-10-31 MED ORDER — SODIUM CHLORIDE 0.9 % IV SOLN
INTRAVENOUS | Status: AC
  Administered 2016-10-31: 75 mL/h via INTRAVENOUS
  Administered 2016-11-01: 05:00:00 via INTRAVENOUS

## 2016-10-31 MED ORDER — HEPARIN SODIUM (PORCINE) 5000 UNIT/ML IJ SOLN
5000.0000 [IU] | Freq: Three times a day (TID) | INTRAMUSCULAR | Status: DC
  Administered 2016-10-31: 5000 [IU] via SUBCUTANEOUS
  Filled 2016-10-31: qty 1

## 2016-10-31 MED ORDER — SODIUM CHLORIDE 0.9 % IV SOLN: INTRAVENOUS | Status: AC

## 2016-10-31 MED ORDER — ACETAMINOPHEN 650 MG RE SUPP: 650.0000 mg | RECTAL | Status: DC | PRN

## 2016-10-31 MED ORDER — ATORVASTATIN CALCIUM 20 MG PO TABS
20.0000 mg | ORAL_TABLET | Freq: Every day | ORAL | Status: DC
  Administered 2016-10-31: 20 mg via ORAL
  Filled 2016-10-31: qty 2
  Filled 2016-10-31 (×2): qty 1

## 2016-10-31 MED ORDER — LORAZEPAM 0.5 MG PO TABS
0.5000 mg | ORAL_TABLET | Freq: Once | ORAL | Status: AC
  Administered 2016-10-31: 0.5 mg via ORAL
  Filled 2016-10-31: qty 1

## 2016-10-31 MED ORDER — SENNOSIDES-DOCUSATE SODIUM 8.6-50 MG PO TABS
1.0000 | ORAL_TABLET | Freq: Every evening | ORAL | Status: DC | PRN
Start: 2016-10-31 — End: 2016-11-10

## 2016-10-31 MED ORDER — NICOTINE 14 MG/24HR TD PT24
14.0000 mg | MEDICATED_PATCH | Freq: Every day | TRANSDERMAL | Status: DC
  Administered 2016-11-01 – 2016-11-10 (×10): 14 mg via TRANSDERMAL
  Filled 2016-10-31 (×11): qty 1

## 2016-10-31 MED ORDER — CLOPIDOGREL BISULFATE 75 MG PO TABS
75.0000 mg | ORAL_TABLET | Freq: Every day | ORAL | Status: DC
  Administered 2016-10-31 – 2016-11-02 (×3): 75 mg via ORAL
  Filled 2016-10-31 (×3): qty 1

## 2016-10-31 MED ORDER — STROKE: EARLY STAGES OF RECOVERY BOOK
Freq: Once | Status: AC
  Administered 2016-10-31: 19:00:00
  Filled 2016-10-31: qty 1

## 2016-10-31 MED ORDER — ADULT MULTIVITAMIN W/MINERALS CH
1.0000 | ORAL_TABLET | Freq: Every day | ORAL | Status: DC
  Administered 2016-10-31 – 2016-11-02 (×3): 1 via ORAL
  Filled 2016-10-31 (×4): qty 1

## 2016-10-31 MED ORDER — HYDRALAZINE HCL 20 MG/ML IJ SOLN
10.0000 mg | Freq: Three times a day (TID) | INTRAMUSCULAR | Status: DC | PRN
  Administered 2016-11-04: 10 mg via INTRAVENOUS
  Filled 2016-10-31: qty 1

## 2016-10-31 MED ORDER — ACETAMINOPHEN 325 MG PO TABS: 650.0000 mg | ORAL_TABLET | ORAL | Status: DC | PRN

## 2016-10-31 MED ORDER — DIPHENHYDRAMINE HCL 25 MG PO CAPS
25.0000 mg | ORAL_CAPSULE | Freq: Once | ORAL | Status: AC
  Administered 2016-10-31: 25 mg via ORAL
  Filled 2016-10-31: qty 1

## 2016-10-31 MED ORDER — ACETAMINOPHEN 160 MG/5ML PO SOLN: 650.0000 mg | ORAL | Status: DC | PRN

## 2016-10-31 NOTE — ED Provider Notes (Signed)
[facility] DEPT Provider Note   CSN: [id number] Arrival date & time: 10/31/16  4098     History   Chief Complaint Chief Complaint  Patient presents with  . Leg Pain    HPI KATALEIA QUARANTA is a 83 y.o. female hx of afib not on blood thinners, HTN, recent stroke but uncompliant with plavix, here with R leg and arm weakness. She went to bed last night and was fine and woke up around 4 AM and had acute onset of right leg weakness. She states that she was walking and she was dragging her right foot. Also has worsening right arm weakness. She states that she had a stroke in March of this year and had some right arm weakness that is residual per the weakness got worse this morning. Denies facial numbness or weakness and denies speech deficit. She hasn't been taking plavix as prescribed and only took half a tablet 2 days ago.     The history is provided by the patient.    Past Medical History:  Diagnosis Date  . Atrial fibrillation ([place])   . Cancer ([place])    basal cell carcinoma  . History of PSVT (paroxysmal supraventricular tachycardia)    Reported back as far as 2010.  Marland Kitchen Palpitations   . Stroke [facility])     Patient Active Problem List   Diagnosis Date Noted  . Right arm weakness 06/27/2016  . Right arm numbness 06/27/2016  . Deviated septum 06/27/2016  . Disorder of ethmoidal sinus 06/27/2016  . Acute ischemic stroke ([place])   . Dysarthria   . Paroxysmal SVT (supraventricular tachycardia) ([place]) 05/21/2016  . Angiopathy, peripheral ([place]) 01/07/2015  . Cloudy posterior capsule 06/01/2013  . Pseudoaphakia 05/02/2013  . Malaise and fatigue 02/16/2013  . Cataract, nuclear 01/28/2012  . Palpitations 02/17/2011  . Claudication ([place]) 02/17/2011  . Tobacco abuse 02/17/2011    Past Surgical History:  Procedure Laterality Date  . APPENDECTOMY  1993  . CHOLECYSTECTOMY  1996  . ROTATOR CUFF REPAIR Right 1990  . US ECHOCARDIOGRAPHY  01/09/2009   EF 55-60%  . WRIST FRACTURE SURGERY       OB History    No data available       Home Medications    Prior to Admission medications   Medication Sig Start Date End Date Taking? Authorizing Provider  atorvastatin (LIPITOR) 20 MG tablet TAKE 1 TABLET AT 6PM. Patient taking differently: Take 10 mg by mouth every 3 days 07/30/16  Yes Hilty, Nadean Corwin, MD  clopidogrel (PLAVIX) 75 MG tablet TAKE 1 TABLET ONCE DAILY. Patient taking differently: Take 37.5 mg by mouth every 3 days 07/30/16  Yes Hilty, Nadean Corwin, MD  Doxylamine Succinate, Sleep, (SLEEP AID PO) Take 1 tablet by mouth at bedtime.    Yes [provider]  metoprolol succinate (TOPROL-XL) 100 MG 24 hr tablet Take  0.5 tablet ( 50 mg total) once daily 05/19/16  Yes Leonie Man, MD  Multiple Vitamin (MULTIVITAMIN) tablet Take 1 tablet by mouth daily.     Yes [provider]  Polyethyl Glycol-Propyl Glycol (SYSTANE OP) Place 1 drop into both eyes daily as needed (for dry eyes).    Yes [provider]    Family History Family History  Problem Relation Age of Onset  . Cancer Father        prostate  . Diabetes Father   . Varicose Veins Mother   . Cardiomyopathy Mother   . Kidney cancer Brother   .  Arrhythmia Brother     Social History Social History  Substance Use Topics  . Smoking status: Former Smoker    Packs/day: 0.25    Years: 45.00    Types: Cigarettes    Quit date: 06/10/2016  . Smokeless tobacco: Never Used  . Alcohol use 4.2 oz/week    7 Glasses of wine per week     Comment: 1 glass wine daily     Allergies   Morphine and related and Levofloxacin   Review of Systems Review of Systems  Neurological: Positive for weakness.  All other systems reviewed and are negative.    Physical Exam Updated Vital Signs BP (!) 171/155   Pulse 78   Temp 97.6 F (36.4 C) (Oral)   Resp (!) 26   SpO2 93%   Physical Exam  Constitutional:  Uncomfortable   HENT:  Head: Normocephalic.  Mouth/Throat: Oropharynx is clear and  moist.  Eyes: Pupils are equal, round, and reactive to light. Conjunctivae and EOM are normal.  Neck: Normal range of motion. Neck supple.  Cardiovascular: Normal rate, regular rhythm and normal heart sounds.   Pulmonary/Chest: Effort normal and breath sounds normal.  Abdominal: Soft. Bowel sounds are normal. She exhibits no distension. There is no tenderness.  Musculoskeletal: Normal range of motion.  Neurological:  ? L facial droop. Strength 4/5 R arm, 3/5 R leg.   Skin: Skin is warm.  Psychiatric: She has a normal mood and affect.  Nursing note and vitals reviewed.    ED Treatments / Results  Labs (all labs ordered are listed, but only abnormal results are displayed) Labs Reviewed  CBC WITH DIFFERENTIAL/PLATELET  COMPREHENSIVE METABOLIC PANEL  CK  ETHANOL  PROTIME-INR  APTT  RAPID URINE DRUG SCREEN, HOSP PERFORMED  URINALYSIS, ROUTINE W REFLEX MICROSCOPIC  I-STAT CHEM 8, ED  I-STAT TROPONIN, ED    EKG  EKG Interpretation  Date/Time:  Sunday October 31 2016 10:02:45 EDT Ventricular Rate:  82 PR Interval:    QRS Duration: 82 QT Interval:  381 QTC Calculation: 445 R Axis:   -37 Text Interpretation:  Sinus rhythm Left axis deviation Abnormal R-wave progression, early transition Consider anterior infarct No significant change since last tracing Confirmed by Wandra Arthurs [phone]) on 10/31/2016 11:03:52 AM       Radiology No results found.  Procedures Procedures (including critical care time)  Medications Ordered in ED Medications  LORazepam (ATIVAN) tablet 0.5 mg (not administered)     Initial Impression / Assessment and Plan / ED Course  I have reviewed the triage vital signs and the nursing notes.  Pertinent labs & imaging results that were available during my care of the patient were reviewed by me and considered in my medical decision making (see chart for details).     KIMA MALENFANT is a 83 y.o. female here with R leg and arm weakness. Outside of window  for TPA but within in 12 hr window. I called neuro hospitalist Dr. Rory Percy around 11 am and he agrees with no code stroke and will see patient.   1:15 pm CT unremarkable. MRI showed multiple new strokes. Will admit for stroke workup   Final Clinical Impressions(s) / ED Diagnoses   Final diagnoses:  Stroke [facility])    New Prescriptions New Prescriptions   No medications on file     Drenda Freeze, MD 10/31/16 1338

## 2016-10-31 NOTE — Progress Notes (Signed)
Patient requesting nicotine patch and "over the counter" sleep aide.Patient states she smokes 0.25packs/day. MD paged.

## 2016-10-31 NOTE — H&P (Signed)
[facility] History and Physical  Regina James [name]:[id number] DOB: 1933-05-10 DOA: 10/31/2016  Referring physician:  PCP: Kathyrn Lass, MD   Chief Complaint: stroke  HPI: Regina James is a 83 y.o. female  pmhx significant for skin ca, stroke and paroxysmal SVT. Patient has been noncompliant with her statin and Plavix after her last stroke. Patient presents today after she fell. Significant right upper lower extremity weakness. Did not call for help for several hours. His says she crawled across the floor at her home. Hopefully symptoms will resolve. Patient brought to the emergency room by EMS.  ED course: CT suspicious and MRI confirm stroke. Neuro hospitalist consulted in the emergency room. Hospitalist team consulted for admission.   Review of Systems:  As per HPI otherwise 10 point review of systems negative.    Past Medical History:  Diagnosis Date  . Cancer ([place])    basal cell carcinoma  . History of PSVT (paroxysmal supraventricular tachycardia)    Reported back as far as 2010.  Marland Kitchen Palpitations   . Stroke [facility])    Past Surgical History:  Procedure Laterality Date  . APPENDECTOMY  1993  . CHOLECYSTECTOMY  1996  . ROTATOR CUFF REPAIR Right 1990  . US ECHOCARDIOGRAPHY  01/09/2009   EF 55-60%  . WRIST FRACTURE SURGERY     Social History:  reports that she quit smoking about 4 months ago. Her smoking use included Cigarettes. She has a 11.25 pack-year smoking history. She has never used smokeless tobacco. She reports that she drinks about 4.2 oz of alcohol per week . She reports that she does not use drugs.  Allergies  Allergen Reactions  . Morphine And Related Swelling  . Levofloxacin Anxiety and Other (See Comments)    Double vision    Family History  Problem Relation Age of Onset  . Cancer Father        prostate  . Diabetes Father   . Varicose Veins Mother   . Cardiomyopathy Mother   . Kidney cancer Brother   . Arrhythmia Brother      Prior to  Admission medications   Medication Sig Start Date End Date Taking? Authorizing Provider  atorvastatin (LIPITOR) 20 MG tablet TAKE 1 TABLET AT 6PM. Patient taking differently: Take 10 mg by mouth every 3 days 07/30/16  Yes Hilty, Nadean Corwin, MD  clopidogrel (PLAVIX) 75 MG tablet TAKE 1 TABLET ONCE DAILY. Patient taking differently: Take 37.5 mg by mouth every 3 days 07/30/16  Yes Hilty, Nadean Corwin, MD  Doxylamine Succinate, Sleep, (SLEEP AID PO) Take 1 tablet by mouth at bedtime.    Yes [provider]  metoprolol succinate (TOPROL-XL) 100 MG 24 hr tablet Take  0.5 tablet ( 50 mg total) once daily 05/19/16  Yes Leonie Man, MD  Multiple Vitamin (MULTIVITAMIN) tablet Take 1 tablet by mouth daily.     Yes [provider]  Polyethyl Glycol-Propyl Glycol (SYSTANE OP) Place 1 drop into both eyes daily as needed (for dry eyes).    Yes [provider]   Physical Exam: Vitals:   10/31/16 1445 10/31/16 1500 10/31/16 1515 10/31/16 1614  BP: (!) 159/76 (!) 147/72 (!) 148/70 (!) 165/71  Pulse: 78 71 73 70  Resp: (!) 23 17 (!) 22 20  Temp:    98.4 F (36.9 C)  TempSrc:    Oral  SpO2: 98% 100% 99% 99%    Wt Readings from Last 3 Encounters:  08/26/16 73.5 kg (162 lb)  08/10/16  73 kg (161 lb)  06/27/16 70.3 kg (155 lb)    General:  Appears calm and comfortable, Alert and oriented 3 Eyes:  PERRL, EOMI, normal lids, iris ENT:  grossly normal hearing, lips & tongue Neck:  no LAD, masses or thyromegaly Cardiovascular:  RRR, no m/r/g. No LE edema.  Respiratory:  CTA bilaterally, no w/r/r. Normal respiratory effort. Abdomen:  soft, ntnd Skin:  no rash or induration seen on limited exam Musculoskeletal:  grossly normal tone BUE/BLE; Right lower extremity weaker than left with plantar flexion Psychiatric:  grossly normal mood and affect, speech fluent and appropriate Neurologic:  CN 2-12 grossly intact, moves all extremities in coordinated fashion.          Labs on  Admission:  Basic Metabolic Panel:  Recent Labs Lab 10/31/16 1055 10/31/16 1106  NA 143 143  K 3.9 4.0  CL 110 108  CO2 25  --   GLUCOSE 98 97  BUN 8 9  CREATININE 0.75 0.70  CALCIUM 9.1  --    Liver Function Tests:  Recent Labs Lab 10/31/16 1055  AST 37  ALT 38  ALKPHOS 106  BILITOT 0.8  PROT 7.0  ALBUMIN 3.5   No results for input(s): LIPASE, AMYLASE in the last 168 hours. No results for input(s): AMMONIA in the last 168 hours. CBC:  Recent Labs Lab 10/31/16 1055 10/31/16 1106  WBC 9.6  --   NEUTROABS 6.3  --   HGB 14.1 14.3  HCT 42.0 42.0  MCV 93.3  --   PLT 277  --    Cardiac Enzymes:  Recent Labs Lab 10/31/16 1055  CKTOTAL 208    BNP (last 3 results) No results for input(s): BNP in the last 8760 hours.  ProBNP (last 3 results) No results for input(s): PROBNP in the last 8760 hours.   Creatinine clearance cannot be calculated (Unknown ideal weight.)  CBG: No results for input(s): GLUCAP in the last 168 hours.  Radiological Exams on Admission: Ct Head Wo Contrast  Result Date: 10/31/2016 CLINICAL DATA:  Pt wok up with Crump of the right leg PT reports waking at 0400 With Rt leg cramp. Pt reports she had a limp since the cramp. EXAM: CT HEAD WITHOUT CONTRAST TECHNIQUE: Contiguous axial images were obtained from the base of the skull through the vertex without intravenous contrast. COMPARISON:  06/27/2016 FINDINGS: Brain: Patchy areas of hypoattenuation in left frontal and parietal white matter are more conspicuous than on previous exam. Negative for acute intracranial hemorrhage, midline shift, mass, mass effect, hydrocephalus, or extra-axial collection. Acute infarct may be inapparent on noncontrast CT. Vascular: Atherosclerotic and physiologic intracranial calcifications. Skull: Normal. Negative for fracture or focal lesion. Sinuses/Orbits: No acute finding. Other: None. IMPRESSION: 1. Negative for bleed or other acute process. 2. Patchy left  frontal parietal white matter hypointensities more conspicuous than on prior study. Electronically Signed   By: Lucrezia Europe M.D.   On: 10/31/2016 12:00   Mr Brain Limited Wo Contrast  Result Date: 10/31/2016 CLINICAL DATA:  Right sided weakness, increased in the upper extremity and new in the lower extremity. Prior stroke with residual right-sided weakness. EXAM: MRI HEAD WITHOUT CONTRAST TECHNIQUE: Multiplanar, multiecho pulse sequences of the brain and surrounding structures were obtained without intravenous contrast. COMPARISON:  Head CT 10/31/2016 and MRI 06/27/2016 FINDINGS: Brain: Patchy, small areas of acute infarction are present in the high posterior left frontal lobe, predominantly involving cortex. Additional scattered punctate acute infarcts are present more anteriorly in the left  centrum semiovale as well as CED inferior left parietal white matter and left lateral temporal occipital junction cortex. Scattered small chronic cortical infarcts are present in the posterior left cerebral hemisphere corresponding to some of the acute infarcts on the prior MRI. There is a focal area of hemosiderin staining in the superior aspect of the left occipital lobe which is sulcal and suggestive of prior subarachnoid hemorrhage, close to a chronic infarct. There is also a focus of chronic microhemorrhage more superiorly in the left parietal lobe. There is mild cerebral atrophy. No mass, midline shift, or extra-axial fluid collection is seen. Small foci of T2 hyperintensity scattered throughout the cerebral white matter bilaterally are nonspecific but compatible with chronic small vessel ischemic disease, relatively mild for age Vascular: Major intracranial vascular flow voids are preserved. Skull and upper cervical spine: Unremarkable bone marrow signal. Sinuses/Orbits: Bilateral cataract extraction. Paranasal sinuses and mastoid air cells are clear. Other: None. IMPRESSION: 1. Scattered small acute infarcts in the  left cerebral hemisphere, greatest in the posterior frontal lobe near the vertex. 2. Mild chronic small vessel ischemic disease. Electronically Signed   By: Logan Bores M.D.   On: 10/31/2016 13:22    EKG: Independently reviewed. NSR. No stemi.  Assessment/Plan Active Problems:   Stroke (cerebrum) (HCC)   Stroke CT head negative for acute bleed  MRI also pos Aspirin full dose given  And daily A1c ordered Lipid panel ordered Admitted to telemetry bed TEE needed per neuro they will coordinate Carotid Dopplers ordered Stroke team to follow patient Cont statin, plavix Permissive HTN  P SVT Cont toprol  Code Status: full  DVT Prophylaxis: heparin Family Communication: none Disposition Plan: Pending Improvement  Status: tele inpt  Elwin Mocha, MD Family Medicine [facility] [email or web address] Password [email or web address]

## 2016-10-31 NOTE — ED Notes (Signed)
Pt has improved strength and miovement pof right arm and leg.

## 2016-10-31 NOTE — ED Notes (Signed)
Patient transported to MRI 

## 2016-10-31 NOTE — ED Notes (Signed)
Pt returns from ct  

## 2016-10-31 NOTE — ED Triage Notes (Addendum)
PT reports waking at 0400  With Rt leg cramp. Pt reports she had a limp since the cramp.

## 2016-10-31 NOTE — Consult Note (Signed)
Requesting Physician: Dr. Darl Householder    Chief Complaint: Stroke  History obtained from: Patient and Chart   HPI:                                                                                                                                      Regina James is an 83 y.o. female with a past medical history significant for CVA in March, 2018 with residual proximal RUE weakness, HTN, tobacco abuse, tachyarrhythmia that does not appear to be afib (per cards workup in March) presents with right sided weakness. She states that she woke up around 0400 by a severe right leg cramp and persistent right leg weakness following resolution of the cramp. She states that the RUE weakness has worsened. She denies word trouble speaking and focal neurological deficit otherwise. Regina James states acknowledges that she does not take plavix as prescribed.   Pertinent Imaging: CT head 10/31/16: Patchy left frontal parietal white matter hypointensities more conspicuous than on prior study. MRI 10/31/16: pending formal read CTA head/neck 06/27/16:No LVO. Hypoplastic right vertebral artery. Atheromatous plaque up to 30% on the right and 40-50% on the left carotid bifurcations. Superimposed penetrating atheromatous ulcer at the aortic arch. Narrowing of approximately 40% at the origin of the great vessels. Severe 80% left subclavian artery stenosis as above. MR brain 3/25: Widely scattered but small, mostly cortically based, acute infarcts in the posterior Left MCA and the superior Left PCA territories. Otherwise stable since 2007.  Date last known well: Date: 10/30/2016 Time last known well: Time: 23:00  tPA Given: No: Out of window, no cortical signs  Stroke Risk Factors - carotid stenosis, hypertension and smoking   Past Medical History:  Diagnosis Date  . Atrial fibrillation ([place])   . Cancer ([place])    basal cell carcinoma  . History of PSVT (paroxysmal supraventricular tachycardia)    Reported back as far as 2010.   Marland Kitchen Palpitations   . Stroke [facility])     Past Surgical History:  Procedure Laterality Date  . APPENDECTOMY  1993  . CHOLECYSTECTOMY  1996  . ROTATOR CUFF REPAIR Right 1990  . US ECHOCARDIOGRAPHY  01/09/2009   EF 55-60%  . WRIST FRACTURE SURGERY      Family History  Problem Relation Age of Onset  . Cancer Father        prostate  . Diabetes Father   . Varicose Veins Mother   . Cardiomyopathy Mother   . Kidney cancer Brother   . Arrhythmia Brother      reports that she quit smoking about 4 months ago. Her smoking use included Cigarettes. She has a 11.25 pack-year smoking history. She has never used smokeless tobacco. She reports that she drinks about 4.2 oz of alcohol per week . She reports that she does not use drugs.  Allergies  Allergen Reactions  . Morphine And Related Swelling  . Levofloxacin Anxiety and  Other (See Comments)    Double vision    Medications:                                                                                                                       Current Meds  Medication Sig  . atorvastatin (LIPITOR) 20 MG tablet TAKE 1 TABLET AT 6PM. (Patient taking differently: Take 10 mg by mouth every 3 days)  . clopidogrel (PLAVIX) 75 MG tablet TAKE 1 TABLET ONCE DAILY. (Patient taking differently: Take 37.5 mg by mouth every 3 days)  . Doxylamine Succinate, Sleep, (SLEEP AID PO) Take 1 tablet by mouth at bedtime.   . metoprolol succinate (TOPROL-XL) 100 MG 24 hr tablet Take  0.5 tablet ( 50 mg total) once daily  . Multiple Vitamin (MULTIVITAMIN) tablet Take 1 tablet by mouth daily.    Vladimir Faster Glycol-Propyl Glycol (SYSTANE OP) Place 1 drop into both eyes daily as needed (for dry eyes).     Review Of Systems:                                                                                                           History obtained from the patient  General: Negative for chills, fatigue, fever Psychological: Negative for any known behavioral  disorder Ophthalmic: Negative for blurry or double vision, loss of vision ENT: Negative for tinnitus or vertigo Respiratory: Negative for cough, shortness of breath or wheezing Cardiovascular: Negative for chest pain, dyspnea on exertion, edema Gastrointestinal: Negative for abdominal pain Genito-Urinary: Negative for dysuria Musculoskeletal: Negative for joint swelling or muscular weakness Neurological: As noted in HPI Dermatological: Negative for rash or skin changes, numbness or tingling  Blood pressure (!) 171/155, pulse 78, temperature 98 F (36.7 C), resp. rate (!) 26, SpO2 93 %.   Physical Examination:                                                                                                      General: WDWN female.  HEENT:  Normocephalic, no lesions, without obvious abnormality.  Normal external eye and conjunctiva.  Normal external ears. Normal external nose, mucus membranes  and septum.  Normal pharynx. Cardiovascular: S1, S2 normal, pulses palpable throughout   Pulmonary: chest clear, no wheezing, rales, normal symmetric air entry Abdomen: soft Extremities: no joint deformities, effusion, or inflammation Musculoskeletal: no joint tenderness, deformity or swelling \ Skin: warm and dry, no hyperpigmentation, vitiligo, or suspicious lesions  Neurological Examination:                                                                                               Mental Status: Regina James is alert, oriented, thought content appropriate.  Speech fluent without evidence of aphasia. Able to follow 3-step commands without difficulty. Cranial Nerves: II: Visual fields grossly normal, pupils are equal, round, reactive to light and accommodation. III,IV, VI: Ptosis not present, extra-ocular muscle movements intact bilaterally V,VII: Smile and eyebrow raise is symmetric. Facial light touch and pinprick sensation intact bilaterally VIII: Hearing grossly intact IX,X: Uvula and  palate rise symmetrically XI: SCM and bilateral shoulder shrug strength 5/5 XII: Midline tongue extension Motor: Right :     Upper extremity   4-/5 proximally; 5/5 distally Left:     Upper extremity   5/5          Lower extremity   2/5     Lower extremity   5/5 Pronator drift not present Sensory: Pinprick and light touch intact throughout, bilaterally Deep Tendon Reflexes: 2+ and symmetric throughout BUE; Dropped BLE. Plantars: Right: downgoing   Left: downgoing Cerebellar: Coordinated rapid alternating movements ataxic on the right Proprioception: Vibratory sense greater on the left than the right Gait: Not tested  On our follow up visit, after the MRI, Regina James had a complete resolution of symptoms, including 5/5 strength throughout  Initial NIHSS 1a Level of Conscious.: 0 1b LOC Questions: 0 1c LOC Commands: 0 2 Best Gaze: 0 3 Visual: 0 4 Facial Palsy: 0 5a Motor Arm - left: 0 5b Motor Arm - Right: 1 6a Motor Leg - Left: 0 6b Motor Leg - Right: 1 7 Limb Ataxia: 0 8 Sensory: 0 9 Best Language: 0 10 Dysarthria: 0 11 Extinct. and Inatten.: 0 TOTAL: 2   Lab Results: Basic Metabolic Panel:  Recent Labs Lab 10/31/16 1055 10/31/16 1106  NA 143 143  K 3.9 4.0  CL 110 108  CO2 25  --   GLUCOSE 98 97  BUN 8 9  CREATININE 0.75 0.70  CALCIUM 9.1  --     Liver Function Tests:  Recent Labs Lab 10/31/16 1055  AST 37  ALT 38  ALKPHOS 106  BILITOT 0.8  PROT 7.0  ALBUMIN 3.5   No results for input(s): LIPASE, AMYLASE in the last 168 hours. No results for input(s): AMMONIA in the last 168 hours.  CBC:  Recent Labs Lab 10/31/16 1055 10/31/16 1106  WBC 9.6  --   NEUTROABS 6.3  --   HGB 14.1 14.3  HCT 42.0 42.0  MCV 93.3  --   PLT 277  --     Cardiac Enzymes:  Recent Labs Lab 10/31/16 1055  CKTOTAL 208    Lipid Panel: No results for input(s): CHOL, TRIG,  HDL, CHOLHDL, VLDL, LDLCALC in the last 168 hours.  CBG: No results for input(s): GLUCAP  in the last 168 hours.  Microbiology: No results found for this or any previous visit.  Coagulation Studies: No results for input(s): LABPROT, INR in the last 72 hours.  Imaging: Ct Head Wo Contrast  Result Date: 10/31/2016 CLINICAL DATA:  Pt wok up with Crump of the right leg PT reports waking at 0400 With Rt leg cramp. Pt reports she had a limp since the cramp. EXAM: CT HEAD WITHOUT CONTRAST TECHNIQUE: Contiguous axial images were obtained from the base of the skull through the vertex without intravenous contrast. COMPARISON:  06/27/2016 FINDINGS: Brain: Patchy areas of hypoattenuation in left frontal and parietal white matter are more conspicuous than on previous exam. Negative for acute intracranial hemorrhage, midline shift, mass, mass effect, hydrocephalus, or extra-axial collection. Acute infarct may be inapparent on noncontrast CT. Vascular: Atherosclerotic and physiologic intracranial calcifications. Skull: Normal. Negative for fracture or focal lesion. Sinuses/Orbits: No acute finding. Other: None. IMPRESSION: 1. Negative for bleed or other acute process. 2. Patchy left frontal parietal white matter hypointensities more conspicuous than on prior study. Electronically Signed   By: Lucrezia Europe M.D.   On: 10/31/2016 12:00   CTA from last admission shows evidence of atherosclerotic disease in both carotids, including the left ICA and left subclavian along with an aortic atheroma.   Assessment  83 year old female with recent CVA with residual right sided weakness presents with increased weakness of the RUE and new onset weakness of the RLE. It is plausible that the present symptoms are reflective of a recrudesence of her previous stroke or new stroke.   Stat MRI was ordered and showed new strokes that appeared to be of an embolic source.  During her last admission, she was discharged with the advice to get a transesophageal echocardiogram as well as a loop recorder placed because there was  high suspicion for underlying atrial fibrillation given the embolic appearance of her strokes and etiology still being present. She did not follow-up with either of those. She continues to smoke, and is also noncompliant to her Plavix. We discussed in detail the importance of medication compliance along with equal importance of smoking cessation in detail. We also discussed various methods that can be used for smoking cessation including patches, gum as well as medications. She verbalized understanding of the importance of smoking cessation as well as compliance to medications and assured [place] that she will be compliant to her antiplatelets from now on.   Impression Left MCA territory stroke-embolic-?atheroembolic (from the arch of left carotid) versus cardio embolic.   Plan:  -Admit to hospitalist service -HgbA1c, fasting lipid panel -MRA  of the brain without contrast and MRA neck with contrast -PT consult, OT consult, Speech consult -TTE followed by TEE -Plavix 75 grams by mouth daily -Continue with Lipitor 20 daily -Risk factor modification - discussed in detail -telemetry monitoring - might need Loop recorder (cardiology consultation from last admission did not confirm atrial fibrillation, high suspicion for A. fib based on the appearance of the strokes) -Frequent neuro checks -NPO until passes stroke swallow screen Please page stroke NP  Or  PA  Or MD from 8am -4 pm  as this patient from this time will be  followed by the stroke.   You can look them up on [email or web address] [facility] [phone]  If 7pm to 7am, please call on call  as listed on AMION.

## 2016-10-31 NOTE — ED Notes (Signed)
Dr. Arora at bedside 

## 2016-11-01 ENCOUNTER — Encounter (HOSPITAL_COMMUNITY): Payer: Self-pay

## 2016-11-01 ENCOUNTER — Inpatient Hospital Stay (HOSPITAL_COMMUNITY): Payer: Medicare Other

## 2016-11-01 DIAGNOSIS — I712 Thoracic aortic aneurysm, without rupture: Secondary | ICD-10-CM | POA: Diagnosis present

## 2016-11-01 DIAGNOSIS — I7122 Aneurysm of the aortic arch, without rupture: Secondary | ICD-10-CM | POA: Diagnosis present

## 2016-11-01 DIAGNOSIS — I10 Essential (primary) hypertension: Secondary | ICD-10-CM | POA: Diagnosis present

## 2016-11-01 DIAGNOSIS — E782 Mixed hyperlipidemia: Secondary | ICD-10-CM | POA: Diagnosis present

## 2016-11-01 DIAGNOSIS — I714 Abdominal aortic aneurysm, without rupture: Secondary | ICD-10-CM

## 2016-11-01 DIAGNOSIS — R000 Tachycardia, unspecified: Secondary | ICD-10-CM

## 2016-11-01 DIAGNOSIS — I63132 Cerebral infarction due to embolism of left carotid artery: Secondary | ICD-10-CM

## 2016-11-01 DIAGNOSIS — I6522 Occlusion and stenosis of left carotid artery: Secondary | ICD-10-CM

## 2016-11-01 LAB — CBC WITH DIFFERENTIAL/PLATELET
Basophils Absolute: 0.1 10*3/uL (ref 0.0–0.1)
Basophils Relative: 1 %
Eosinophils Absolute: 0.2 10*3/uL (ref 0.0–0.7)
Eosinophils Relative: 2 %
HCT: 38.5 % (ref 36.0–46.0)
Hemoglobin: 13 g/dL (ref 12.0–15.0)
Lymphocytes Relative: 36 %
Lymphs Abs: 2.5 10*3/uL (ref 0.7–4.0)
MCH: 31.5 pg (ref 26.0–34.0)
MCHC: 33.8 g/dL (ref 30.0–36.0)
MCV: 93.2 fL (ref 78.0–100.0)
Monocytes Absolute: 0.7 10*3/uL (ref 0.1–1.0)
Monocytes Relative: 9 %
Neutro Abs: 3.6 10*3/uL (ref 1.7–7.7)
Neutrophils Relative %: 52 %
Platelets: 262 10*3/uL (ref 150–400)
RBC: 4.13 MIL/uL (ref 3.87–5.11)
RDW: 15 % (ref 11.5–15.5)
WBC: 7 10*3/uL (ref 4.0–10.5)

## 2016-11-01 LAB — BASIC METABOLIC PANEL
Anion gap: 7 (ref 5–15)
BUN: 8 mg/dL (ref 6–20)
CO2: 23 mmol/L (ref 22–32)
Calcium: 8.5 mg/dL — ABNORMAL LOW (ref 8.9–10.3)
Chloride: 109 mmol/L (ref 101–111)
Creatinine, Ser: 0.68 mg/dL (ref 0.44–1.00)
GFR calc Af Amer: >60 mL/min (ref >60)
GFR calc non Af Amer: >60 mL/min (ref >60)
Glucose, Bld: 103 mg/dL — ABNORMAL HIGH (ref 65–99)
Potassium: 3.6 mmol/L (ref 3.5–5.1)
Sodium: 139 mmol/L (ref 135–145)

## 2016-11-01 LAB — LIPID PANEL
Cholesterol: 147 mg/dL (ref 0–200)
HDL: 42 mg/dL (ref >40)
LDL Cholesterol: 89 mg/dL (ref 0–99)
Total CHOL/HDL Ratio: 3.5 RATIO
Triglycerides: 82 mg/dL (ref <150)
VLDL: 16 mg/dL (ref 0–40)

## 2016-11-01 MED ORDER — PRAVASTATIN SODIUM 20 MG PO TABS
20.0000 mg | ORAL_TABLET | Freq: Every day | ORAL | Status: DC
  Administered 2016-11-01 – 2016-11-09 (×7): 20 mg via ORAL
  Filled 2016-11-01 (×7): qty 1

## 2016-11-01 MED ORDER — NON FORMULARY: 3.0000 mg | Freq: Once | Status: DC

## 2016-11-01 MED ORDER — ENOXAPARIN SODIUM 40 MG/0.4ML ~~LOC~~ SOLN
40.0000 mg | SUBCUTANEOUS | Status: DC
  Administered 2016-11-01 – 2016-11-02 (×2): 40 mg via SUBCUTANEOUS
  Filled 2016-11-01 (×2): qty 0.4

## 2016-11-01 MED ORDER — MELATONIN 3 MG PO TABS
3.0000 mg | ORAL_TABLET | Freq: Once | ORAL | Status: AC
  Administered 2016-11-02: 3 mg via ORAL
  Filled 2016-11-01: qty 1

## 2016-11-01 MED ORDER — IOPAMIDOL (ISOVUE-370) INJECTION 76%
INTRAVENOUS | Status: AC
  Administered 2016-11-01: 50 mL
  Filled 2016-11-01: qty 50

## 2016-11-01 MED ORDER — ASPIRIN EC 325 MG PO TBEC
325.0000 mg | DELAYED_RELEASE_TABLET | Freq: Every day | ORAL | Status: DC
  Administered 2016-11-01 – 2016-11-02 (×2): 325 mg via ORAL
  Filled 2016-11-01 (×2): qty 1

## 2016-11-01 NOTE — Progress Notes (Signed)
STROKE TEAM PROGRESS NOTE   HISTORY OF PRESENT ILLNESS (per record) Regina James an 83 y.o.femalewith a history of cancer, history PSVT, palpitations, tobacco abuse, CVA in March, 2018 with residual proximal RUE weakness, HTN, who presented with right sided weakness.  She states that she woke up around 0400 on 10/31/2016 by a severe right leg cramp and persistent right leg weakness following resolution of the cramp. She states that the RUE weakness has worsened. She denies word trouble speaking and focal neurological deficit otherwise. Regina James states acknowledges that she does not take plavix as prescribed (takes one pill every three days) and is not taking her statin.  Pertinent Imaging: CT head 10/31/16: Patchy left frontal parietal white matter hypointensities more conspicuous than on prior study. MRI 10/31/16: pending formal read CTA head/neck 06/27/16:No LVO. Hypoplastic right vertebral artery. Atheromatous plaque up to30% on the right and 40-50% on the left carotid bifurcations. Superimposed penetrating atheromatous ulcer at the aortic arch. Narrowing of approximately 40% at the origin of the great vessels. Severe 80% left subclavian artery stenosis as above. MR brain 3/25:Widely scattered but small, mostly cortically based, acute infarcts in the posterior Left MCA and the superior Left PCA territories. Otherwise stable since 2007.  Date last known well: Date: 10/30/2016 Time last known well: Time: 23:00  Patient was not administered IV t-PA secondary to arriving outside of the treatment window/presenting without cortical signs.  She was admitted to General Neurology for further evaluation and treatment.   SUBJECTIVE (INTERVAL HISTORY) Her daughter is at the bedside.  The patient is awake, alert, and follows all commands appropriately.  The patient confirms that she is still smoking, but is willing to try quitting.  The patient confirms that she is only taking Plavix every third  day at home.  The patient says she is amenable to trying an alternative statin to atorvastatin.     OBJECTIVE Temp:  [97.8 F (36.6 C)-98.4 F (36.9 C)] 98 F (36.7 C) (07/30 0947) Pulse Rate:  [68-90] 90 (07/30 0947) Cardiac Rhythm: Normal sinus rhythm (07/30 0700) Resp:  [17-23] 18 (07/30 0947) BP: (124-165)/(66-87) 162/80 (07/30 0947) SpO2:  [96 %-100 %] 99 % (07/30 0947)  CBC:  Recent Labs Lab 10/31/16 1055 10/31/16 1106 11/01/16 0341  WBC 9.6  --  7.0  NEUTROABS 6.3  --  3.6  HGB 14.1 14.3 13.0  HCT 42.0 42.0 38.5  MCV 93.3  --  93.2  PLT 277  --  633    Basic Metabolic Panel:  Recent Labs Lab 10/31/16 1055 10/31/16 1106 11/01/16 0341  NA 143 143 139  K 3.9 4.0 3.6  CL 110 108 109  CO2 25  --  23  GLUCOSE 98 97 103*  BUN 8 9 8   CREATININE 0.75 0.70 0.68  CALCIUM 9.1  --  8.5*    Lipid Panel:    Component Value Date/Time   CHOL 147 11/01/2016 0341   TRIG 82 11/01/2016 0341   HDL 42 11/01/2016 0341   CHOLHDL 3.5 11/01/2016 0341   VLDL 16 11/01/2016 0341   LDLCALC 89 11/01/2016 0341   HgbA1c:  Lab Results  Component Value Date   HGBA1C 5.6 06/28/2016   Urine Drug Screen:    Component Value Date/Time   LABOPIA NONE DETECTED 10/31/2016 1541   COCAINSCRNUR NONE DETECTED 10/31/2016 1541   LABBENZ NONE DETECTED 10/31/2016 1541   AMPHETMU NONE DETECTED 10/31/2016 1541   THCU NONE DETECTED 10/31/2016 1541   LABBARB NONE DETECTED 10/31/2016 1541  Alcohol Level     Component Value Date/Time   ETH <5 10/31/2016 1055    IMAGING I have personally reviewed the radiological images below and agree with the radiology interpretations.  Ct Head Wo Contrast 10/31/2016 IMPRESSION: 1. Negative for bleed or other acute process. 2. Patchy left frontal parietal white matter hypointensities more conspicuous than on prior study.  Ct Angio Head W Or Wo Contrast Ct Angio Neck W Or Wo Contrast 11/01/2016 IMPRESSION: 1. Penetrating Aortic Ulcer (PAU) with  progression since March, now measuring up to 14 mm. See series 9, image 171 and series 12, image 127. 2. Negative for large vessel occlusion. Mild progression of bulky soft plaque at the left ICA origin since March, with 60% proximal Left ICA stenosis. No other large vessel stenosis. 3. Mild to moderate atherosclerotic irregularity of the left MCA and left greater than right PCA branches. 4. Continued stable non contrast CT appearance of the brain.    Mr Brain Limited Wo Contrast 10/31/2016 IMPRESSION: 1. Scattered small acute infarcts in the left cerebral hemisphere, greatest in the posterior frontal lobe near the vertex. 2. Mild chronic small vessel ischemic disease.  2D Echo 06/28/2016 Study Conclusions - Left ventricle: The cavity size was normal. There was mild concentric hypertrophy. Systolic function was normal. The estimated ejection fraction was in the range of 55% to 60%. Wall motion was normal; there were no regional wall motion abnormalities. Doppler parameters are consistent with abnormal left ventricular relaxation (grade 1 diastolic dysfunction).  Doppler parameters are consistent with high ventricular filling pressure. - Aortic valve: Transvalvular velocity was within the normal range.  There was no stenosis. There was no regurgitation. - Mitral valve: Transvalvular velocity was within the normal range.  There was no evidence for stenosis. There was no regurgitation. - Left atrium: The atrium was moderately dilated. - Right ventricle: The cavity size was normal. Wall thickness was normal. Systolic function was normal. - Atrial septum: No defect or patent foramen ovale was identified. - Tricuspid valve: There was mild regurgitation. - Pulmonary arteries: Systolic pressure was within the normal   range. PA peak pressure: 30 mm Hg (S).  VAS [place] LE 06/28/2016 Summary: - No evidence of deep vein or superficial thrombosis involving the right lower extremity and left lower  extremity.  TEE pending   PHYSICAL EXAM  Temp:  [97.8 F (36.6 C)-98.4 F (36.9 C)] 98.3 F (36.8 C) (07/30 1337) Pulse Rate:  [70-90] 74 (07/30 1337) Resp:  [18-20] 18 (07/30 1337) BP: (149-186)/(68-85) 186/85 (07/30 1337) SpO2:  [97 %-100 %] 100 % (07/30 1337)  General - Well nourished, well developed, in no apparent distress.  Ophthalmologic - Sharp disc margins OU.   Cardiovascular - Regular rate and rhythm.  Mental Status -  Level of arousal and orientation to time, place, and person were intact. Language including expression, naming, repetition, comprehension was assessed and found intact. Attention span and concentration were normal. Fund of Knowledge was assessed and was intact.  Cranial Nerves II - XII - II - Visual field intact OU. III, IV, VI - Extraocular movements intact. V - Facial sensation intact bilaterally. VII - Facial movement intact bilaterally. VIII - Hearing & vestibular intact bilaterally. X - Palate elevates symmetrically. XI - Chin turning & shoulder shrug intact bilaterally. XII - Tongue protrusion intact.  Motor Strength - The patient's strength was normal in all extremities except right hand dexterity difficulty and and pronator drift was present, as well as right foot DF 4+/5.  Bulk was normal and fasciculations were absent.   Motor Tone - Muscle tone was assessed at the neck and appendages and was normal.  Reflexes - The patient's reflexes were 1+ in all extremities and she had no pathological reflexes.  Sensory - Light touch, temperature/pinprick were assessed and were symmetrical.    Coordination - The patient had normal movements in the hands with no ataxia or dysmetria.  Tremor was absent.  Gait and Station - deferred.   ASSESSMENT/PLAN Ms. XANIYAH BUCHHOLZ is a 83 y.o. female with history of cancer, history PSVT, palpitations, tobacco abuse, CVA in March, 2018 with residual proximal RUE weakness, HTN, who presented with right sided  weakness. She did not receive IV t-PA due to arriving outside of the treatment window/presenting without cortical signs.  Stroke:  Scattered small acute infarcts in the left MCA and ACA, likely embolic, due to left ICA soft plaque with stenosis vs. cardioembolic with PSVT and suspicious for afib.  Resultant  Right hand and right foot mild weakness  CT head: Patchy left frontal parietal white matter hypointensities more conspicuous than on prior study.  MRI head: Scattered small acute infarcts in the left cerebral hemisphere, greatest in the posterior frontal lobe near the vertex.   CTA: increased left proximal ICA soft plaque and stenosis now at 60%. No LVO or high-grade stenosis.  14 mm penetrating aortic ulcer.  2D Echo (06/28/2016): EF 60-65%. No source of embolus.   TEE and loop pending  LDL 89  HgbA1c pending  SCDs for VTE prophylaxis  Diet Heart Room service appropriate? Yes; Fluid consistency: Thin  clopidogrel 75 mg daily prior to admission, now on aspirin 325 mg daily and clopidogrel 75 mg daily. Continue DAPT for 3 months and then plavix alone.  Patient counseled to be compliant with her antithrombotic medications  Ongoing aggressive stroke risk factor management  Therapy recommendations:  pending  Disposition:  Pending  Left ICA stenosis with soft plaque  Could be the reason for recurrent left MCA / ACA infarcts  Recommend vascular surgery consultation for possible CEA  Continue DAPT  Aortic ulceration   Distal to the left CCA take off - unlikely the cause of her stroke  recommend vascular surgery consultation for further monitoring and management  Hx of stroke  06/2016 right hand weakness - CTA head negative - CTA head left ICA 40-50% stenosis, aortic ulceration - MRI scattered left MCA infarcts - LDL 95 and A1C 5.6 - changed ASA to plavix and add lipitor - recommend outpt TEE and loop  08/2016 followed up in [place] with Dr. Leta Baptist - arranged TEE and loop  but pt did not answer the arrangement with cardiology  Pt also noncompliant with plavix and lipitor  Hypertension  Unstable  Permissive hypertension (OK if < 220/120) but gradually normalize in 5-7 days  Long-term BP goal normotensive  Hyperlipidemia  Home meds: atorvastatin 20mg , not taking at home  LDL 89, goal < 70  Will change to pravastatin 20mg  for compliance  Continue statin at discharge   Tobacco abuse  Current smoker  Smoking cessation counseling provided  Nicotine patch provided  Pt is willing to quit  Other Stroke Risk Factors  Advanced age  ETOH use, advised to drink no more than 1 drink(s) a day  Other Active Problems  None  Hospital day # 1  [name], MD PhD Stroke Neurology 11/01/2016 4:47 PM   To contact Stroke Continuity provider, please refer to [email or web address] After hours, contact General Neurology

## 2016-11-01 NOTE — Evaluation (Signed)
SLP Cancellation Note  Patient Details Name: Regina James MRN: [id number] DOB: 11-Mar-1934   Cancelled treatment:       Reason Eval/Treat Not Completed: SLP screened, no needs identified, will sign off (pt holding high level conversation with RN re: po medications and timing of blood thinner injection, no dysarthria noted, no needs)  Luanna Salk, MS [facility] SLP [phone]  Macario Golds 11/01/2016, 9:34 AM

## 2016-11-01 NOTE — Progress Notes (Signed)
OT Cancellation Note  Patient Details Name: Regina James MRN: [id number] DOB: Dec 01, 1933   Cancelled Treatment:    Reason Eval/Treat Not Completed: Patient at procedure or test/ unavailable (At CT. Will attempt to reutrn later today.)  Gordon, OT/L  [phone] 11/01/2016 11/01/2016, 11:41 AM

## 2016-11-01 NOTE — Consult Note (Signed)
[facility] NOTE   MRN : [id number]  Reason for Consult: left carotid stenosis Requesting  Physician: Dr. Rich Brave  History of Present Illness: 83 y/o female admitted secondary to a fall and is being worked up for possible stroke. Now has residual weakness in right lower extremity. Right upper extremity and speech not affected   She was last seen in our office 08/26/2016 and previously seen for claudication.  S/p stroke with right UE weakness.  Previous carotid ultrasound showed <40% ICA bilaterally with 80% left subclavian artery stenosis distal to the left vertebral artery.  The left subclavian was asymptomatic Prior CTA also showed a 4.6 cm ascending aortic aneurysm.   She was scheduled for a follow up ultrasound of the ascending aortic aneurysm.    The patient had a stroke March 25. Her symptoms were right arm weakness and some residual right hand clumsiness. She is currently on Plavix and a statin. She had not been on aspirin prior to the event. She also has a history of cardiac arrhythmia. She is scheduled tomorrow for a transesophageal echo. She had never had a prior stroke.   States that she is going to quit smoking.      Current Facility-Administered Medications  Medication Dose Route Frequency Provider Last Rate Last Dose  . acetaminophen (TYLENOL) tablet 650 mg  650 mg Oral Q4H PRN Elwin Mocha, MD       Or  . acetaminophen (TYLENOL) solution 650 mg  650 mg Per Tube Q4H PRN Elwin Mocha, MD       Or  . acetaminophen (TYLENOL) suppository 650 mg  650 mg Rectal Q4H PRN Elwin Mocha, MD      . aspirin EC tablet 325 mg  325 mg Oral Daily Rama, Venetia Maxon, MD   325 mg at 11/01/16 1145  . atorvastatin (LIPITOR) tablet 20 mg  20 mg Oral q1800 Elwin Mocha, MD   20 mg at 10/31/16 1842  . clopidogrel (PLAVIX) tablet 75 mg  75 mg Oral Daily Elwin Mocha, MD   75 mg at 11/01/16 6734  . enoxaparin (LOVENOX) injection 40 mg  40 mg  Subcutaneous Q24H Rama, Venetia Maxon, MD   40 mg at 11/01/16 0921  . hydrALAZINE (APRESOLINE) injection 10 mg  10 mg Intravenous Q8H PRN Elwin Mocha, MD      . metoprolol succinate (TOPROL-XL) 24 hr tablet 50 mg  50 mg Oral Daily Elwin Mocha, MD   50 mg at 11/01/16 1937  . multivitamin with minerals tablet 1 tablet  1 tablet Oral Daily Elwin Mocha, MD   1 tablet at 11/01/16 9024  . nicotine (NICODERM CQ - dosed in mg/24 hours) patch 14 mg  14 mg Transdermal Daily Rise Patience, MD   14 mg at 11/01/16 0000  . senna-docusate (Senokot-S) tablet 1 tablet  1 tablet Oral QHS PRN Elwin Mocha, MD          Past Medical History:  Diagnosis Date  . Cancer ([place])    basal cell carcinoma  . History of PSVT (paroxysmal supraventricular tachycardia)    Reported back as far as 2010.  Marland Kitchen Palpitations   . Stroke [facility])     Past Surgical History:  Procedure Laterality Date  . APPENDECTOMY  1993  . CHOLECYSTECTOMY  1996  . ROTATOR CUFF REPAIR Right 1990  . US ECHOCARDIOGRAPHY  01/09/2009   EF 55-60%  . WRIST FRACTURE SURGERY  Social History Social History  Substance Use Topics  . Smoking status: Former Smoker    Packs/day: 0.25    Years: 45.00    Types: Cigarettes    Quit date: 06/10/2016  . Smokeless tobacco: Never Used  . Alcohol use 4.2 oz/week    7 Glasses of wine per week     Comment: 1 glass wine daily    Family History Family History  Problem Relation Age of Onset  . Cancer Father        prostate  . Diabetes Father   . Varicose Veins Mother   . Cardiomyopathy Mother   . Kidney cancer Brother   . Arrhythmia Brother     Allergies  Allergen Reactions  . Morphine And Related Swelling  . Levofloxacin Anxiety and Other (See Comments)    Double vision     REVIEW OF SYSTEMS  General: [ ]  Weight loss, [ ]  Fever, [ ]  chills Neurologic: [ ]  Dizziness, [ ]  Blackouts, [ ]  Seizure [ ]  Stroke, [ ]  "Mini stroke", [ ]  Slurred speech, [ ]  Temporary  blindness; [ ]  weakness in arms or legs, [ ]  Hoarseness [ ]  Dysphagia Cardiac: [ ]  Chest pain/pressure, [ ]  Shortness of breath at rest [ ]  Shortness of breath with exertion, [ ]  Atrial fibrillation or irregular heartbeat  Vascular: [ ]  Pain in legs with walking, [ ]  Pain in legs at rest, [ ]  Pain in legs at night,  [ ]  Non-healing ulcer, [ ]  Blood clot in vein/DVT,   Pulmonary: [ ]  Home oxygen, [ ]  Productive cough, [ ]  Coughing up blood, [ ]  Asthma,  [ ]  Wheezing [ ]  COPD Musculoskeletal:  [ ]  Arthritis, [ ]  Low back pain, [ ]  Joint pain Hematologic: [ ]  Easy Bruising, [ ]  Anemia; [ ]  Hepatitis Gastrointestinal: [ ]  Blood in stool, [ ]  Gastroesophageal Reflux/heartburn, Urinary: [ ]  chronic Kidney disease, [ ]  on HD - [ ]  MWF or [ ]  TTHS, [ ]  Burning with urination, [ ]  Difficulty urinating Skin: [ ]  Rashes, [ ]  Wounds Psychological: [ ]  Anxiety, [ ]  Depression  Physical Examination Vitals:   11/01/16 0445 11/01/16 0651 11/01/16 0947 11/01/16 1337  BP: (!) 161/75 (!) 159/79 (!) 162/80 (!) 186/85  Pulse:  75 90 74  Resp: 18 18 18 18   Temp:  97.8 F (36.6 C) 98 F (36.7 C) 98.3 F (36.8 C)  TempSrc:  Oral Oral Oral  SpO2: 97% 98% 99% 100%   There is no height or weight on file to calculate BMI.  General:  WDWN in NAD Gait: Normal HENT: WNL Eyes: Pupils equal Pulmonary: normal non-labored breathing , without Rales, rhonchi,  wheezing Cardiac: RRR, without  Murmurs, rubs or gallops; bilateral femoral pulses are palpable No carotid bruits Abdomen: soft, NT, no masses Skin: no rashes, ulcers noted;  no Gangrene , no cellulitis; no open wounds;    Musculoskeletal: no muscle wasting or atrophy; no edema  Neurologic: A&O X 3; Appropriate Affect ;  SENSATION: normal; MOTOR FUNCTION: 5/5 Symmetric Speech is fluent/normal   Significant Diagnostic Studies: CBC Lab Results  Component Value Date   WBC 7.0 11/01/2016   HGB 13.0 11/01/2016   HCT 38.5 11/01/2016   MCV 93.2  11/01/2016   PLT 262 11/01/2016    BMET    Component Value Date/Time   NA 139 11/01/2016 0341   K 3.6 11/01/2016 0341   CL 109 11/01/2016 0341   CO2 23 11/01/2016 0341  GLUCOSE 103 (H) 11/01/2016 0341   BUN 8 11/01/2016 0341   CREATININE 0.68 11/01/2016 0341   CALCIUM 8.5 (L) 11/01/2016 0341   GFRNONAA >60 11/01/2016 0341   GFRAA >60 11/01/2016 0341   CrCl cannot be calculated (Unknown ideal weight.).  COAG Lab Results  Component Value Date   INR 1.10 10/31/2016   INR 1.04 06/27/2016     Non-Invasive Vascular Imaging:  CTA 11/01/2016 IMPRESSION: 1. Penetrating Aortic Ulcer (PAU) with progression since March, now measuring up to 14 mm. See series 9, image 171 and series 12, image 127. 2. Negative for large vessel occlusion. Mild progression of bulky soft plaque at the left ICA origin since March, with 60% proximal Left ICA stenosis. No other large vessel stenosis. 3. Mild to moderate atherosclerotic irregularity of the left MCA and left greater than right PCA branches.  MRI 10/31/2016 Study Result   CLINICAL DATA:  Right sided weakness, increased in the upper extremity and new in the lower extremity. Prior stroke with residual right-sided weakness.  EXAM: MRI HEAD WITHOUT CONTRAST  TECHNIQUE: Multiplanar, multiecho pulse sequences of the brain and surrounding structures were obtained without intravenous contrast.  COMPARISON:  Head CT 10/31/2016 and MRI 06/27/2016  FINDINGS: Brain: Patchy, small areas of acute infarction are present in the high posterior left frontal lobe, predominantly involving cortex. Additional scattered punctate acute infarcts are present more anteriorly in the left centrum semiovale as well as CED inferior left parietal white matter and left lateral temporal occipital junction cortex. Scattered small chronic cortical infarcts are present in the posterior left cerebral hemisphere corresponding to some of the acute infarcts on the  prior MRI. There is a focal area of hemosiderin staining in the superior aspect of the left occipital lobe which is sulcal and suggestive of prior subarachnoid hemorrhage, close to a chronic infarct. There is also a focus of chronic microhemorrhage more superiorly in the left parietal lobe. There is mild cerebral atrophy. No mass, midline shift, or extra-axial fluid collection is seen. Small foci of T2 hyperintensity scattered throughout the cerebral white matter bilaterally are nonspecific but compatible with chronic small vessel ischemic disease, relatively mild for age  Vascular: Major intracranial vascular flow voids are preserved.  Skull and upper cervical spine: Unremarkable bone marrow signal.  Sinuses/Orbits: Bilateral cataract extraction. Paranasal sinuses and mastoid air cells are clear.  Other: None.  IMPRESSION: 1. Scattered small acute infarcts in the left cerebral hemisphere, greatest in the posterior frontal lobe near the vertex. 2. Mild chronic small vessel ischemic disease.          ASSESSMENT/PLAN:  83 year old female here forAcute left cerebral hemispheric strokes. She does have 60% stenosis of her left internal carotid artery which is progressed from her previous stroke where was measured at 40%. She is to undergo transesophageal echo tomorrow. Pending that workup I will discuss carotid endarterectomy with Dr. Oneida Alar in the near future. Continue dual antiplatelet therapy and statin.  Paiten Boies C. Donzetta Matters, MD [facility] [place] Office: [phone] Pager: [phone]

## 2016-11-01 NOTE — Progress Notes (Signed)
Progress Note    Regina James  AOZ:[id number] DOB: 08-Dec-1933  DOA: 10/31/2016 PCP: Kathyrn Lass, MD    Brief Narrative:   Chief complaint: Follow-up stroke  Medical records reviewed and are as summarized below:  Regina James is an 83 y.o. female with a PMH of stroke diagnosed 06/2016 when she presented with right upper extremity weakness, found to have scattered left MCA infarcts. Workup at that time included CTA of the head and neck which showed left subclavian artery stenosis, and aortic arch aneurysm and large vessel vasculopathy. 2-D echo did not show any cardioembolic source. She was also found to have paroxysmal SVT. Cardiologist was consulted and planned an outpatient TEE and loop recorder. She was discharged on Lipitor, aspirin and Plavix. It was also recommended that she follow-up with vascular surgery to address her left subclavian arterial stenosis. She followed up as an outpatient with neurology on 08/10/16, but had not yet followed up with cardiology for the TEE or loop recorder. She did see Dr. Oneida Alar from vascular surgery 08/26/16, and it was felt that the benefits did not outweigh the risks to stent the left subclavian artery. To address the ascending aortic aneurysm, an abdominal ultrasound was scheduled. She returned to the hospital 10/31/16 for evaluation of the acute onset of right leg weakness and worsening right arm weakness. She admitted that she had not been compliant with taking her Lipitor or Plavix as prescribed. An MRI was obtained which showed scattered small acute infarcts in the left cerebral hemisphere.  Assessment/Plan:   Principal Problem:   Embolic stroke [facility]) Neurohospitalist consulted. Follow-up MRA of the brain and MRA of the neck, 2-D Echo/TEE. PT/OT/ST evaluations requested.  Cardiology consult for TEE/loop recorder. Continue ASA/Plavix/Lipitor.  Active Problems:   Mixed dyslipidemia Cholesterol 167, triglycerides 206, LDL 95.  Continue  Lipitor.    Essential hypertension Continue metoprolol. Allow permissive hypertension.    [facility]  Cardiology consulted. Monitor on telemetry.    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    Left ICA stenosis Given progression and possible association with recurrent stroke, neurologist recommends consideration for carotid endarterectomy. Vascular surgeon consulted.  Family Communication/Anticipated D/C date and plan/Code Status   DVT prophylaxis: Lovenox ordered. Code Status: Full Code.  Family Communication: Daughter updated at bedside. Disposition Plan:    Medical Consultants:    Neurology   Anti-Infectives:    None  Subjective:   Able to move right side better, but still notices some weakness.  No dysarthria, dysphagia or reports of paresthesia.  Objective:    Vitals:   11/01/16 0033 11/01/16 0230 11/01/16 0445 11/01/16 0651  BP: (!) 163/79 (!) 149/71 (!) 161/75 (!) 159/79  Pulse: 73 70  75  Resp: 18 18 18 18   Temp: 98.3 F (36.8 C)   97.8 F (36.6 C)  TempSrc: Oral   Oral  SpO2: 97% 98% 97% 98%    Intake/Output Summary (Last 24 hours) at 11/01/16 9629 Last data filed at 11/01/16 0704  Gross per 24 hour  Intake            922.5 ml  Output                0 ml  Net            922.5 ml   There were no vitals filed for this visit.  Exam: General: Well developed, well nourished female. No acute distress. Cardiovascular: Heart sounds show a regular rate,  and rhythm with an occasional premature beat. No gallops or rubs. II/VI murmur. No JVD. Lungs: Clear to auscultation bilaterally with good air movement. No rales, rhonchi or wheezes. Abdomen: Soft, nontender, nondistended with normal active bowel sounds. No masses. No hepatosplenomegaly. Neurological: Alert and oriented 3. Cranial nerves II through XII grossly intact. Mild right hemiparesis. Skin: Warm and dry. No rashes or lesions. Extremities: No  clubbing or cyanosis. No edema. Pedal pulses 2+. Psychiatric: Mood and affect are normal. Insight and judgment are mildly impaired.   Data Reviewed:   I have personally reviewed following labs and imaging studies:  Labs: Labs show the following:   Basic Metabolic Panel:  Recent Labs Lab 10/31/16 1055 10/31/16 1106 11/01/16 0341  NA 143 143 139  K 3.9 4.0 3.6  CL 110 108 109  CO2 25  --  23  GLUCOSE 98 97 103*  BUN 8 9 8   CREATININE 0.75 0.70 0.68  CALCIUM 9.1  --  8.5*   GFR CrCl cannot be calculated (Unknown ideal weight.). Liver Function Tests:  Recent Labs Lab 10/31/16 1055  AST 37  ALT 38  ALKPHOS 106  BILITOT 0.8  PROT 7.0  ALBUMIN 3.5   Coagulation profile  Recent Labs Lab 10/31/16 1055  INR 1.10    CBC:  Recent Labs Lab 10/31/16 1055 10/31/16 1106 11/01/16 0341  WBC 9.6  --  7.0  NEUTROABS 6.3  --  3.6  HGB 14.1 14.3 13.0  HCT 42.0 42.0 38.5  MCV 93.3  --  93.2  PLT 277  --  262   Cardiac Enzymes:  Recent Labs Lab 10/31/16 1055  CKTOTAL 208   Lipid Profile:  Recent Labs  11/01/16 0341  CHOL 147  HDL 42  LDLCALC 89  TRIG 82  CHOLHDL 3.5    Microbiology No results found for this or any previous visit (from the past 240 hour(s)).  Procedures and diagnostic studies:  Ct Head Wo Contrast 10/31/2016: Personally reviewed. No acute infarcts are apparent.  Mr Brain Limited Wo Contrast 10/31/2016: Personally reviewed and is as pictured below and shows scattered small acute infarcts in the left cerebral hemisphere.    Medications:   . atorvastatin  20 mg Oral q1800  . clopidogrel  75 mg Oral Daily  . heparin  5,000 Units Subcutaneous Q8H  . metoprolol succinate  50 mg Oral Daily  . multivitamin with minerals  1 tablet Oral Daily  . nicotine  14 mg Transdermal Daily   Continuous Infusions: . sodium chloride Stopped (11/01/16 0710)  . sodium chloride      Medical decision making is of high complexity and this patient  is at high risk of deterioration, therefore this is a level 3 visit.  (> 4 problem points, >4 data points, high risk: Need 2 out of 3)   LOS: 1 day   RAMA,CHRISTINA  [facility] Pager [phone]. If unable to reach me by pager, please call my cell phone at [phone].  *Please refer to [email or web address], password [email or web address] to get updated schedule on who will round on this patient, as hospitalists switch teams weekly. If 7PM-7AM, please contact night-coverage at [email or web address], password [email or web address] for any overnight needs.  11/01/2016, 8:12 AM

## 2016-11-01 NOTE — Progress Notes (Signed)
    [facility] has been requested to perform a transesophageal echocardiogram on Regina James for CVA.  After careful review of history and examination, the risks and benefits of transesophageal echocardiogram have been explained including risks of esophageal damage, perforation (1:10,000 risk), bleeding, pharyngeal hematoma as well as other potential complications associated with conscious sedation including aspiration, arrhythmia, respiratory failure and death. Alternatives to treatment were discussed, questions were answered. Patient is willing to proceed.   Cecilie Kicks, NP  11/01/2016 4:21 PM

## 2016-11-01 NOTE — Progress Notes (Signed)
PT Cancellation Note  Patient Details Name: Regina James MRN: [id number] DOB: 1933/05/13   Cancelled Treatment:    Reason Eval/Treat Not Completed: Patient at procedure or test/unavailable. Pt off unit for testing at this time. Will check back as schedule allows to continue with PT POC.   Rolinda Roan, PT, DPT Acute Rehabilitation Services Pager: [phone]   Thelma Comp 11/01/2016, 11:17 AM

## 2016-11-02 ENCOUNTER — Encounter (HOSPITAL_COMMUNITY)
Admission: EM | Disposition: A | Discharge status: Skilled Nursing Facility | Payer: Self-pay | Source: Home / Self Care | Attending: Vascular Surgery

## 2016-11-02 ENCOUNTER — Inpatient Hospital Stay (HOSPITAL_COMMUNITY): Payer: Medicare Other

## 2016-11-02 ENCOUNTER — Encounter (HOSPITAL_COMMUNITY): Payer: Self-pay | Admitting: *Deleted

## 2016-11-02 DIAGNOSIS — I639 Cerebral infarction, unspecified: Secondary | ICD-10-CM

## 2016-11-02 DIAGNOSIS — I63119 Cerebral infarction due to embolism of unspecified vertebral artery: Secondary | ICD-10-CM

## 2016-11-02 DIAGNOSIS — I340 Nonrheumatic mitral (valve) insufficiency: Secondary | ICD-10-CM

## 2016-11-02 DIAGNOSIS — I6349 Cerebral infarction due to embolism of other cerebral artery: Secondary | ICD-10-CM

## 2016-11-02 HISTORY — PX: LOOP RECORDER INSERTION: EP1214

## 2016-11-02 HISTORY — PX: TEE WITHOUT CARDIOVERSION: SHX5443

## 2016-11-02 LAB — BASIC METABOLIC PANEL
Anion gap: 9 (ref 5–15)
BUN: 9 mg/dL (ref 6–20)
CO2: 24 mmol/L (ref 22–32)
Calcium: 8.9 mg/dL (ref 8.9–10.3)
Chloride: 110 mmol/L (ref 101–111)
Creatinine, Ser: 0.77 mg/dL (ref 0.44–1.00)
GFR calc Af Amer: >60 mL/min (ref >60)
GFR calc non Af Amer: >60 mL/min (ref >60)
Glucose, Bld: 108 mg/dL — ABNORMAL HIGH (ref 65–99)
Potassium: 3.9 mmol/L (ref 3.5–5.1)
Sodium: 143 mmol/L (ref 135–145)

## 2016-11-02 LAB — PROTIME-INR
INR: 1.06
Prothrombin Time: 13.8 seconds (ref 11.4–15.2)

## 2016-11-02 LAB — HEMOGLOBIN A1C
Hgb A1c MFr Bld: 5.7 % — ABNORMAL HIGH (ref 4.8–5.6)
Mean Plasma Glucose: 117 mg/dL

## 2016-11-02 SURGERY — LOOP RECORDER INSERTION

## 2016-11-02 SURGERY — ECHOCARDIOGRAM, TRANSESOPHAGEAL
Anesthesia: Moderate Sedation

## 2016-11-02 MED ORDER — CEFAZOLIN SODIUM-DEXTROSE 1-4 GM/50ML-% IV SOLN: 1.0000 g | INTRAVENOUS | Status: DC

## 2016-11-02 MED ORDER — FENTANYL CITRATE (PF) 100 MCG/2ML IJ SOLN
INTRAMUSCULAR | Status: DC | PRN
  Administered 2016-11-02: 25 ug via INTRAVENOUS

## 2016-11-02 MED ORDER — MIDAZOLAM HCL 5 MG/ML IJ SOLN
INTRAMUSCULAR | Status: AC
  Filled 2016-11-02: qty 2

## 2016-11-02 MED ORDER — DEXTROSE 5 % IV SOLN: 1.5000 g | INTRAVENOUS | Status: AC

## 2016-11-02 MED ORDER — FENTANYL CITRATE (PF) 100 MCG/2ML IJ SOLN
INTRAMUSCULAR | Status: AC
  Filled 2016-11-02: qty 2

## 2016-11-02 MED ORDER — SODIUM CHLORIDE 0.9 % IV SOLN: INTRAVENOUS | Status: DC

## 2016-11-02 MED ORDER — DIPHENHYDRAMINE HCL 25 MG PO CAPS
25.0000 mg | ORAL_CAPSULE | Freq: Every evening | ORAL | Status: DC | PRN
  Administered 2016-11-02: 25 mg via ORAL
  Filled 2016-11-02: qty 1

## 2016-11-02 MED ORDER — MIDAZOLAM HCL 10 MG/2ML IJ SOLN
INTRAMUSCULAR | Status: DC | PRN
  Administered 2016-11-02: 2 mg via INTRAVENOUS
  Administered 2016-11-02 (×2): 1 mg via INTRAVENOUS

## 2016-11-02 MED ORDER — LIDOCAINE-EPINEPHRINE 1 %-1:100000 IJ SOLN
INTRAMUSCULAR | Status: DC | PRN
  Administered 2016-11-02: 10 mL via INTRADERMAL

## 2016-11-02 MED ORDER — LIDOCAINE-EPINEPHRINE 1 %-1:100000 IJ SOLN
INTRAMUSCULAR | Status: AC
  Filled 2016-11-02: qty 1

## 2016-11-02 MED ORDER — BUTAMBEN-TETRACAINE-BENZOCAINE 2-2-14 % EX AERO
INHALATION_SPRAY | CUTANEOUS | Status: DC | PRN
  Administered 2016-11-02: 2 via TOPICAL

## 2016-11-02 SURGICAL SUPPLY — 2 items
LOOP REVEAL LINQSYS (Prosthesis & Implant Heart) ×1 IMPLANT
PACK LOOP INSERTION (CUSTOM PROCEDURE TRAY) ×2 IMPLANT

## 2016-11-02 NOTE — Interval H&P Note (Signed)
History and Physical Interval Note:  11/02/2016 10:12 AM  Regina James  has presented today for surgery, with the diagnosis of stroke  The various methods of treatment have been discussed with the patient and family. After consideration of risks, benefits and other options for treatment, the patient has consented to  Procedure(s): Loop Recorder Insertion (N/A) as a surgical intervention .  The patient's history has been reviewed, patient examined, no change in status, stable for surgery.  I have reviewed the patient's chart and labs.  Questions were answered to the patient's satisfaction.     [name]

## 2016-11-02 NOTE — H&P (View-Only) (Signed)
ELECTROPHYSIOLOGY CONSULT NOTE  Patient ID: Regina James MRN: [id number], DOB/AGE: 04-27-1933   Admit date: 10/31/2016 Date of Consult: 11/02/2016  Primary Physician: Kathyrn Lass, MD Primary Cardiologist: Dr Ellyn Hack Reason for Consultation: Cryptogenic stroke; recommendations regarding Implantable Loop Recorder (Dr Erlinda Hong consulting)  History of Present Illness Regina James was admitted on 10/31/2016 with acute CVA. she has been monitored on telemetry which has demonstrated no arrhythmias. No cause has been identified. She is making slow recovery.  This is her second stroke.  She had prior stroke in March.  She has been found to have scattered small acute infarcts in  The L MCA and ACA, likely embolic per Dr Erlinda Hong. She has a h/o SVT but no known afib.  Inpatient stroke work-up is to be completed with a TEE. EP has been asked to evaluate for placement of an implantable loop recorder to monitor for atrial fibrillation.  Past Medical History Past Medical History:  Diagnosis Date  . Cancer ([place])    basal cell carcinoma  . History of PSVT (paroxysmal supraventricular tachycardia)    Reported back as far as 2010.  Marland Kitchen Palpitations   . Stroke [facility])     Past Surgical History Past Surgical History:  Procedure Laterality Date  . APPENDECTOMY  1993  . CHOLECYSTECTOMY  1996  . ROTATOR CUFF REPAIR Right 1990  . US ECHOCARDIOGRAPHY  01/09/2009   EF 55-60%  . WRIST FRACTURE SURGERY      Allergies/Intolerances Allergies  Allergen Reactions  . Morphine And Related Swelling  . Levofloxacin Anxiety and Other (See Comments)    Double vision   Inpatient Medications . aspirin EC  325 mg Oral Daily  . clopidogrel  75 mg Oral Daily  . enoxaparin (LOVENOX) injection  40 mg Subcutaneous Q24H  . metoprolol succinate  50 mg Oral Daily  . multivitamin with minerals  1 tablet Oral Daily  . nicotine  14 mg Transdermal Daily  . pravastatin  20 mg Oral q1800    Social History Social History   Social  History  . Marital status: Married    Spouse name: Wouter  . Number of children: 3  . Years of education: 14   Occupational History  .      retired, travel Music therapist   Social History Main Topics  . Smoking status: Former Smoker    Packs/day: 0.25    Years: 45.00    Types: Cigarettes    Quit date: 06/10/2016  . Smokeless tobacco: Never Used  . Alcohol use 4.2 oz/week    7 Glasses of wine per week     Comment: 1 glass wine daily  . Drug use: No  . Sexual activity: Not on file   Other Topics Concern  . Not on file   Social History Narrative   Lives at home w/husband   Caffeine- coffee, 2 cups daily    Review of Systems General: No chills, fever, night sweats or weight changes  Cardiovascular:  No chest pain, dyspnea on exertion, edema, orthopnea, palpitations, paroxysmal nocturnal dyspnea Dermatological: No rash, lesions or masses Respiratory: No cough, dyspnea Urologic: No hematuria, dysuria Abdominal: No nausea, vomiting, diarrhea, bright red blood per rectum, melena, or hematemesis Neurologic: No visual changes, weakness, changes in mental status All other systems reviewed and are otherwise negative except as noted above.  Physical Exam Blood pressure 124/80, pulse 62, temperature 98 F (36.7 C), temperature source Oral, resp. rate 16, SpO2 96 %.  General: Well developed, well appearing 83  y.o. female in no acute distress. HEENT: Normocephalic, atraumatic. EOMs intact. Sclera nonicteric. Oropharynx clear.  Neck: Supple without bruits. No JVD. Lungs: Respirations regular and unlabored, CTA bilaterally. No wheezes, rales or rhonchi. Heart: RRR. S1, S2 present. No murmurs, rub, S3 or S4. Abdomen: Soft, non-tender, non-distended. BS present x 4 quadrants. No hepatosplenomegaly.  Extremities: No clubbing, cyanosis or edema. DP/PT/Radials 2+ and equal bilaterally. Psych: Normal affect. Neuro: Alert and oriented X 3. Moves all extremities spontaneously. Musculoskeletal: No  kyphosis. Skin: Intact. Warm and dry. No rashes or petechiae in exposed areas.   Labs Lab Results  Component Value Date   WBC 7.0 11/01/2016   HGB 13.0 11/01/2016   HCT 38.5 11/01/2016   MCV 93.2 11/01/2016   PLT 262 11/01/2016    Recent Labs Lab 10/31/16 1055  11/01/16 0341  NA 143  < > 139  K 3.9  < > 3.6  CL 110  < > 109  CO2 25  --  23  BUN 8  < > 8  CREATININE 0.75  < > 0.68  CALCIUM 9.1  --  8.5*  PROT 7.0  --   --   BILITOT 0.8  --   --   ALKPHOS 106  --   --   ALT 38  --   --   AST 37  --   --   GLUCOSE 98  < > 103*  < > = values in this interval not displayed.  Recent Labs  11/02/16 0658  INR 1.06    Radiology/Studies Ct Angio Head W Or Wo Contrast  Addendum Date: 11/01/2016   ADDENDUM REPORT: 11/01/2016 13:16 ADDENDUM: Study discussed by telephone with Dr. [name] on 11/01/2016 at 1249 hours. Electronically Signed   By: Genevie Ann M.D.   On: 11/01/2016 13:16   Result Date: 11/01/2016 CLINICAL DATA:  83 year old female found have scattered acute infarcts in the left hemisphere following presentation of right side weakness. Multiple left side infarcts also in March of this year. EXAM: CT ANGIOGRAPHY HEAD AND NECK TECHNIQUE: Multidetector CT imaging of the head and neck was performed using the standard protocol during bolus administration of intravenous contrast. Multiplanar CT image reconstructions and MIPs were obtained to evaluate the vascular anatomy. Carotid stenosis measurements (when applicable) are obtained utilizing NASCET criteria, using the distal internal carotid diameter as the denominator. CONTRAST:  50 mL Isovue 370 COMPARISON:  Brain MRI 10/31/2016. CT head 10/31/2016. CTA head and neck 06/27/2016 FINDINGS: CT HEAD Brain: No acute intracranial hemorrhage identified. No midline shift, mass effect, or evidence of intracranial mass lesion. Gray-white matter differentiation remains stable by CT in the left hemisphere. No ventriculomegaly. Calvarium and  skull base: Stable. No acute osseous abnormality identified. Paranasal sinuses: Visualized paranasal sinuses and mastoids are stable and well pneumatized. Orbits: No acute orbit or scalp soft tissue findings. CTA NECK Skeleton: No acute osseous abnormality identified. Cervical spine degeneration. Partially visible thoracic dextroconvex scoliosis. Chronic or congenital fusion of the right second and third ribs. Upper chest: Negative lung apices. No superior mediastinal lymphadenopathy. Other neck: Stable, negative.  No cervical lymphadenopathy. Aortic arch: Progressed since March and now 14 mm penetrating ulcer of the lateral aspect of the distal aortic arch (series 9, image 171 today versus series 5 image 169 previously. See also coronal series 12, image 127. Otherwise stable 3 vessel arch configuration with mostly soft aortic arch atherosclerosis. Right carotid system: Stable since March with intermittent right CCA and cervical right ICA soft plaque but  no significant stenosis. Left carotid system: Negative left CCA origin. Medial and posterior wall soft plaque in the left CCA is stable since March. Confluent soft plaque in the left ICA origin and bulb results in proximal left ICA stenosis of 60 % with respect to the distal vessel and is mildly progressed since March. See series 9, image 107 and series 12, images 112 and 113). Distal to the bulb the cervical left ICA is stable and negative. Vertebral arteries:Stable proximal subclavian arteries and vertebral artery origins without significant stenosis. Dominant left vertebral artery. The distal right vertebral artery today is partially obscured by paravertebral venous contrast. No cervical vertebral arteries stenosis. CTA HEAD Posterior circulation: Dominant distal left vertebral artery supplies the basilar and the diminutive right V4 segment functionally terminates in PICA. Left PICA origin remains patent. Tortuous basilar artery without stenosis is stable. SCA  and PCA origins are stable, with a fetal type right PCA origin. The left posterior communicating artery is diminutive or absent. Bilateral PCA branches are stable with mild distal left P2 in bilateral P3 segment irregularity. Anterior circulation: Stable ICA siphons with mild for age calcified plaque and no siphon stenosis. Patent carotid termini. MCA and ACA origins remain normal. Mildly dominant left A1 segment. Anterior communicating artery and bilateral ACA branches are stable. Right MCA M1 segment, bifurcation, and right MCA branches are stable and within normal limits. Left MCA M1 segment remains patent without stenosis. Left MCA trifurcation is patent. Left MCA branches appear stable since March with attenuated anterior sylvian branches, and mild irregularity in the dominant posterior branches. No interval left MCA branch occlusion. Venous sinuses: Patent. Anatomic variants: Dominant left vertebral artery. The distal right vertebral terminates in PICA. Fetal type right PCA origin. Delayed phase: No abnormal enhancement identified. Review of the MIP images confirms the above findings IMPRESSION: 1. Penetrating Aortic Ulcer (PAU) with progression since March, now measuring up to 14 mm. See series 9, image 171 and series 12, image 127. 2. Negative for large vessel occlusion. Mild progression of bulky soft plaque at the left ICA origin since March, with 60% proximal Left ICA stenosis. No other large vessel stenosis. 3. Mild to moderate atherosclerotic irregularity of the left MCA and left greater than right PCA branches. 4. Continued stable non contrast CT appearance of the brain. Electronically Signed: By: Genevie Ann M.D. On: 11/01/2016 12:46   Ct Head Wo Contrast  Result Date: 10/31/2016 CLINICAL DATA:  Pt wok up with Crump of the right leg PT reports waking at 0400 With Rt leg cramp. Pt reports she had a limp since the cramp. EXAM: CT HEAD WITHOUT CONTRAST TECHNIQUE: Contiguous axial images were obtained from  the base of the skull through the vertex without intravenous contrast. COMPARISON:  06/27/2016 FINDINGS: Brain: Patchy areas of hypoattenuation in left frontal and parietal white matter are more conspicuous than on previous exam. Negative for acute intracranial hemorrhage, midline shift, mass, mass effect, hydrocephalus, or extra-axial collection. Acute infarct may be inapparent on noncontrast CT. Vascular: Atherosclerotic and physiologic intracranial calcifications. Skull: Normal. Negative for fracture or focal lesion. Sinuses/Orbits: No acute finding. Other: None. IMPRESSION: 1. Negative for bleed or other acute process. 2. Patchy left frontal parietal white matter hypointensities more conspicuous than on prior study. Electronically Signed   By: Lucrezia Europe M.D.   On: 10/31/2016 12:00   Ct Angio Neck W Or Wo Contrast  Addendum Date: 11/01/2016   ADDENDUM REPORT: 11/01/2016 13:16 ADDENDUM: Study discussed by telephone with Dr. [name]  on 11/01/2016 at 1249 hours. Electronically Signed   By: Genevie Ann M.D.   On: 11/01/2016 13:16   Result Date: 11/01/2016 CLINICAL DATA:  83 year old female found have scattered acute infarcts in the left hemisphere following presentation of right side weakness. Multiple left side infarcts also in March of this year. EXAM: CT ANGIOGRAPHY HEAD AND NECK TECHNIQUE: Multidetector CT imaging of the head and neck was performed using the standard protocol during bolus administration of intravenous contrast. Multiplanar CT image reconstructions and MIPs were obtained to evaluate the vascular anatomy. Carotid stenosis measurements (when applicable) are obtained utilizing NASCET criteria, using the distal internal carotid diameter as the denominator. CONTRAST:  50 mL Isovue 370 COMPARISON:  Brain MRI 10/31/2016. CT head 10/31/2016. CTA head and neck 06/27/2016 FINDINGS: CT HEAD Brain: No acute intracranial hemorrhage identified. No midline shift, mass effect, or evidence of intracranial  mass lesion. Gray-white matter differentiation remains stable by CT in the left hemisphere. No ventriculomegaly. Calvarium and skull base: Stable. No acute osseous abnormality identified. Paranasal sinuses: Visualized paranasal sinuses and mastoids are stable and well pneumatized. Orbits: No acute orbit or scalp soft tissue findings. CTA NECK Skeleton: No acute osseous abnormality identified. Cervical spine degeneration. Partially visible thoracic dextroconvex scoliosis. Chronic or congenital fusion of the right second and third ribs. Upper chest: Negative lung apices. No superior mediastinal lymphadenopathy. Other neck: Stable, negative.  No cervical lymphadenopathy. Aortic arch: Progressed since March and now 14 mm penetrating ulcer of the lateral aspect of the distal aortic arch (series 9, image 171 today versus series 5 image 169 previously. See also coronal series 12, image 127. Otherwise stable 3 vessel arch configuration with mostly soft aortic arch atherosclerosis. Right carotid system: Stable since March with intermittent right CCA and cervical right ICA soft plaque but no significant stenosis. Left carotid system: Negative left CCA origin. Medial and posterior wall soft plaque in the left CCA is stable since March. Confluent soft plaque in the left ICA origin and bulb results in proximal left ICA stenosis of 60 % with respect to the distal vessel and is mildly progressed since March. See series 9, image 107 and series 12, images 112 and 113). Distal to the bulb the cervical left ICA is stable and negative. Vertebral arteries:Stable proximal subclavian arteries and vertebral artery origins without significant stenosis. Dominant left vertebral artery. The distal right vertebral artery today is partially obscured by paravertebral venous contrast. No cervical vertebral arteries stenosis. CTA HEAD Posterior circulation: Dominant distal left vertebral artery supplies the basilar and the diminutive right V4  segment functionally terminates in PICA. Left PICA origin remains patent. Tortuous basilar artery without stenosis is stable. SCA and PCA origins are stable, with a fetal type right PCA origin. The left posterior communicating artery is diminutive or absent. Bilateral PCA branches are stable with mild distal left P2 in bilateral P3 segment irregularity. Anterior circulation: Stable ICA siphons with mild for age calcified plaque and no siphon stenosis. Patent carotid termini. MCA and ACA origins remain normal. Mildly dominant left A1 segment. Anterior communicating artery and bilateral ACA branches are stable. Right MCA M1 segment, bifurcation, and right MCA branches are stable and within normal limits. Left MCA M1 segment remains patent without stenosis. Left MCA trifurcation is patent. Left MCA branches appear stable since March with attenuated anterior sylvian branches, and mild irregularity in the dominant posterior branches. No interval left MCA branch occlusion. Venous sinuses: Patent. Anatomic variants: Dominant left vertebral artery. The distal right vertebral terminates in  PICA. Fetal type right PCA origin. Delayed phase: No abnormal enhancement identified. Review of the MIP images confirms the above findings IMPRESSION: 1. Penetrating Aortic Ulcer (PAU) with progression since March, now measuring up to 14 mm. See series 9, image 171 and series 12, image 127. 2. Negative for large vessel occlusion. Mild progression of bulky soft plaque at the left ICA origin since March, with 60% proximal Left ICA stenosis. No other large vessel stenosis. 3. Mild to moderate atherosclerotic irregularity of the left MCA and left greater than right PCA branches. 4. Continued stable non contrast CT appearance of the brain. Electronically Signed: By: Genevie Ann M.D. On: 11/01/2016 12:46   Mr Brain Limited Wo Contrast  Result Date: 10/31/2016 CLINICAL DATA:  Right sided weakness, increased in the upper extremity and new in the  lower extremity. Prior stroke with residual right-sided weakness. EXAM: MRI HEAD WITHOUT CONTRAST TECHNIQUE: Multiplanar, multiecho pulse sequences of the brain and surrounding structures were obtained without intravenous contrast. COMPARISON:  Head CT 10/31/2016 and MRI 06/27/2016 FINDINGS: Brain: Patchy, small areas of acute infarction are present in the high posterior left frontal lobe, predominantly involving cortex. Additional scattered punctate acute infarcts are present more anteriorly in the left centrum semiovale as well as CED inferior left parietal white matter and left lateral temporal occipital junction cortex. Scattered small chronic cortical infarcts are present in the posterior left cerebral hemisphere corresponding to some of the acute infarcts on the prior MRI. There is a focal area of hemosiderin staining in the superior aspect of the left occipital lobe which is sulcal and suggestive of prior subarachnoid hemorrhage, close to a chronic infarct. There is also a focus of chronic microhemorrhage more superiorly in the left parietal lobe. There is mild cerebral atrophy. No mass, midline shift, or extra-axial fluid collection is seen. Small foci of T2 hyperintensity scattered throughout the cerebral white matter bilaterally are nonspecific but compatible with chronic small vessel ischemic disease, relatively mild for age Vascular: Major intracranial vascular flow voids are preserved. Skull and upper cervical spine: Unremarkable bone marrow signal. Sinuses/Orbits: Bilateral cataract extraction. Paranasal sinuses and mastoid air cells are clear. Other: None. IMPRESSION: 1. Scattered small acute infarcts in the left cerebral hemisphere, greatest in the posterior frontal lobe near the vertex. 2. Mild chronic small vessel ischemic disease. Electronically Signed   By: Logan Bores M.D.   On: 10/31/2016 13:22   US Thyroid  Result Date: 10/05/2016 CLINICAL DATA:  Prior ultrasound follow-up. EXAM: THYROID  ULTRASOUND TECHNIQUE: Ultrasound examination of the thyroid gland and adjacent soft tissues was performed. COMPARISON:  05/14/2014 FINDINGS: Parenchymal Echotexture: Moderately heterogenous Isthmus: Normal in size measures 0.2 cm in diameter, unchanged Right lobe: Slightly diminutive in size measuring 3.4 x 1.5 x 1.7 cm, unchanged, previously, 3.5 x 1.4 x 2.0 cm Left lobe: Diminutive in size measuring 2.6 x 1.0 x 1.1 cm, unchanged, previously, 2.3 x 0.6 x 1.2 cm. _________________________________________________________ Estimated total number of nodules >/= 1 cm: 0 Number of spongiform nodules >/=  2 cm not described below (TR1): 0 Number of mixed cystic and solid nodules >/= 1.5 cm not described below ([place]): 0 _________________________________________________________ Spongiform appearing nodule within the mid aspect the right lobe of the thyroid is decreased in size in the interval, currently measuring 0.7 x 0.5 x 0.7 cm, previously, 1.0 x 0.6 x 0.7 cm. This nodule does not meet imaging criteria to recommend percutaneous sampling or dedicated follow-up. An additional spongiform appearing nodule with the mid aspect the right  lobe of the thyroid is unchanged, currently measuring 0.8 x 0.7 x 0.8 cm, previously, 0.9 x 0.7 x 0.8 cm. This nodule does not meet imaging criteria to recommend percutaneous sampling or dedicated follow-up. Spongiform appearing nodule within the posterior inferior aspect of the right lobe of the thyroid is unchanged to decreased in size the interval, currently measuring 0.9 x 0.6 x 0.6 cm, previously, 1.1 x 0.7 x 1.2 cm. This nodule does not meet imaging criteria to recommend percutaneous sampling or dedicated follow-up. There is a punctate (approximately 0.3 cm) anechoic cyst within the inferior aspect the left lobe of the thyroid which does not meet imaging criteria to recommend percutaneous sampling or dedicated follow-up. IMPRESSION: 1. No new or enlarging thyroid nodules. 2. All discretely  measured thyroid nodules are unchanged to decreased in size compared to the 05/2014 examination. None of the discretely measured thyroid nodules meet imaging criteria to recommend percutaneous sampling or dedicated follow-up. The above is in keeping with the ACR TI-RADS recommendations - J Am Coll Radiol 2017;14:587-595. Electronically Signed   By: Sandi Mariscal M.D.   On: 10/05/2016 16:25    Echocardiogram  March 26,2018 echo is reviewed.  EF is preserved.  No significant valvular disease  12-lead ECG all ekgs in epic are reviewed,  Sinus rhythm is observed on all Telemetry sinus rhythm, no SVT or Afib   Assessment and Plan 1. Cryptogenic stroke The patient presents with recurrent strokes.  Her strokes are multiple territory and felt to possibly be cardioembolic in nature.  TEE has been ordered. If the TEE is negative, I recommend loop recorder insertion to monitor for AF. The indication for loop recorder insertion / monitoring for AF in setting of cryptogenic stroke was discussed with the patient. The loop recorder insertion procedure was reviewed in detail including risks and benefits. These risks include but are not limited to bleeding and infection. The patient expressed verbal understanding and agrees to proceed. The patient was also counseled regarding wound care and device follow-up.  Army Fossa MD 11/02/2016, 7:39 AM

## 2016-11-02 NOTE — Progress Notes (Signed)
Progress Note    Regina James  HBZ:[id number] DOB: 1933/11/16  DOA: 10/31/2016 PCP: Regina Lass, MD    Brief Narrative:   Chief complaint: Follow-up stroke  Medical records reviewed and are as summarized below:  Regina James is an 83 y.o. female with a PMH of stroke diagnosed 06/2016 when she presented with right upper extremity weakness, found to have scattered left MCA infarcts. Workup at that time included CTA of the head and neck which showed left subclavian artery stenosis, and aortic arch aneurysm and large vessel vasculopathy. 2-D echo did not show any cardioembolic source. She was also found to have paroxysmal SVT. Cardiologist was consulted and planned an outpatient TEE and loop recorder. She was discharged on Lipitor, aspirin and Plavix. It was also recommended that she follow-up with vascular surgery to address her left subclavian arterial stenosis. She followed up as an outpatient with neurology on 08/10/16, but had not yet followed up with cardiology for the TEE or loop recorder. She did see Dr. Oneida Alar from vascular surgery 08/26/16, and it was felt that the benefits did not outweigh the risks to stent the left subclavian artery. To address the ascending aortic aneurysm, an abdominal ultrasound was scheduled. She returned to the hospital 10/31/16 for evaluation of the acute onset of right leg weakness and worsening right arm weakness. She admitted that she had not been compliant with taking her Lipitor or Plavix as prescribed. An MRI was obtained which showed scattered small acute infarcts in the left cerebral hemisphere.  Assessment/Plan:   Principal Problem:   Embolic stroke [facility] Neurohospitalist consulted. CT angiogram of the head and neck was negative for large vessel occlusion but showed mild progression of bulky soft plaque at the left ICA [name] with 60% proximal left ICA stenosis. There is also disease of the left MCA and left greater than right PCA branches. Seen by  vascular surgeon with plans to proceed with carotid endarterectomy. TEE/loop recorder done today. Results from TEE are pending. PT is recommending home health PT with 24-hour supervision. OT evaluation pending. No speech therapy needed.  Continue ASA/Plavix/Lipitor.   Active Problems:   Mixed dyslipidemia Cholesterol 167, triglycerides 206, LDL 95.  Continue Lipitor.     Essential hypertension Continue metoprolol. Permissive hypertension allowed for the first 48 hours, now begin to slowly lower blood pressure.    [place]  [facility]iology consulted. Monitoring on telemetry. Loop recorder placed.    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    Left ICA stenosis Plans are to proceed with carotid endarterectomy 11/03/16.  Family Communication/Anticipated D/C date and plan/Code Status   DVT prophylaxis: Lovenox ordered. Code Status: Full Code.  Family Communication: Daughter updated at bedside. Disposition Plan: Home when stable after carotid endarterectomy.   Medical Consultants:    Neurology   Anti-Infectives:    None  Subjective:   Still notices some right sided weakness. No other focal neurological complaints. Appetite good. No reports of bleeding.  Objective:    Vitals:   11/01/16 1337 11/01/16 2033 11/02/16 0057 11/02/16 0522  BP: (!) 186/85 (!) 185/92 (!) 170/76 124/80  Pulse: 74 65 70 62  Resp: 18 18 18 16   Temp: 98.3 F (36.8 C) 98.6 F (37 C) 98.2 F (36.8 C) 98 F (36.7 C)  TempSrc: Oral Oral Oral Oral  SpO2: 100% 99% 98% 96%    Intake/Output Summary (Last 24 hours) at 11/02/16 0758 Last data filed at 11/02/16 0000  Gross per  24 hour  Intake              180 ml  Output                0 ml  Net              180 ml   There were no vitals filed for this visit.  Exam: General: No acute distress. Cardiovascular: Heart sounds show a regular rate, and rhythm. No gallops or rubs. No murmurs. No JVD. Lungs:  Clear to auscultation bilaterally with good air movement. No rales, rhonchi or wheezes. Abdomen: Soft, nontender, nondistended with normal active bowel sounds. No masses. No hepatosplenomegaly. Neurological: Alert and oriented 3. Mild right hemiparesis. Cranial nerves II through XII grossly intact. Skin: Warm and dry. No rashes or lesions. Extremities: No clubbing or cyanosis. No edema. Pedal pulses 2+. Psychiatric: Mood and affect are normal. Insight and judgment are good.   Data Reviewed:   I have personally reviewed following labs and imaging studies:  Labs: Labs show the following:   Basic Metabolic Panel:  Recent Labs Lab 10/31/16 1055 10/31/16 1106 11/01/16 0341 11/02/16 0658  NA 143 143 139 143  K 3.9 4.0 3.6 3.9  CL 110 108 109 110  CO2 25  --  23 24  GLUCOSE 98 97 103* 108*  BUN 8 9 8 9   CREATININE 0.75 0.70 0.68 0.77  CALCIUM 9.1  --  8.5* 8.9   GFR CrCl cannot be calculated (Unknown ideal weight.). Liver Function Tests:  Recent Labs Lab 10/31/16 1055  AST 37  ALT 38  ALKPHOS 106  BILITOT 0.8  PROT 7.0  ALBUMIN 3.5   Coagulation profile  Recent Labs Lab 10/31/16 1055 11/02/16 0658  INR 1.10 1.06    CBC:  Recent Labs Lab 10/31/16 1055 10/31/16 1106 11/01/16 0341  WBC 9.6  --  7.0  NEUTROABS 6.3  --  3.6  HGB 14.1 14.3 13.0  HCT 42.0 42.0 38.5  MCV 93.3  --  93.2  PLT 277  --  262   Cardiac Enzymes:  Recent Labs Lab 10/31/16 1055  CKTOTAL 208   Lipid Profile:  Recent Labs  11/01/16 0341  CHOL 147  HDL 42  LDLCALC 89  TRIG 82  CHOLHDL 3.5    Microbiology No results found for this or any previous visit (from the past 240 hour(s)).  Procedures and diagnostic studies:  Ct Head Wo Contrast 10/31/2016: Personally reviewed. No acute infarcts are apparent.  Mr Brain Limited Wo Contrast 10/31/2016: Personally reviewed and is as pictured below and shows scattered small acute infarcts in the left cerebral  hemisphere.    Medications:   . aspirin EC  325 mg Oral Daily  . clopidogrel  75 mg Oral Daily  . enoxaparin (LOVENOX) injection  40 mg Subcutaneous Q24H  . metoprolol succinate  50 mg Oral Daily  . multivitamin with minerals  1 tablet Oral Daily  . nicotine  14 mg Transdermal Daily  . pravastatin  20 mg Oral q1800   Continuous Infusions:   Medical decision making is of high complexity and this patient is at high risk of deterioration, therefore this is a level 3 visit.  (> 4 problem points, 1 data points, high risk: Need 2 out of 3)   LOS: 2 days   Jamelle Goldston  [facility] Pager [phone]. If unable to reach me by pager, please call my cell phone at [phone].  *Please refer to [email or web address], password [email or web address]  to get updated schedule on who will round on this patient, as hospitalists switch teams weekly. If 7PM-7AM, please contact night-coverage at [email or web address], password [email or web address] for any overnight needs.  11/02/2016, 7:58 AM

## 2016-11-02 NOTE — Evaluation (Signed)
Physical Therapy Evaluation Patient Details Name: Regina James MRN: [id number] DOB: December 08, 1933 Today's Date: 11/02/2016   History of Present Illness  Regina James is an 83 y.o. female with a history of cancer, history PSVT, palpitations, tobacco abuse, CVA in March, 2018 with residual proximal RUE weakness, HTN, who presented with right sided weakness.  Scattered small acute infarcts in the left cerebral hemisphere, greatest in the posterior frontal lobe near the vertex  Clinical Impression  Patient presents with decreased independence and safety with mobility with impaired R side awareness and some proprioceptive deficits as well as decreased strength.  She will benefit from skilled PT in the acute setting to allow return home with family support (24 A) and follow up HHPT.     Follow Up Recommendations Home health PT;Supervision/Assistance - 24 hour    Equipment Recommendations  Rolling walker with 5" wheels    Recommendations for Other Services       Precautions / Restrictions Precautions Precautions: Fall Precaution Comments: decreased R side awarness      Mobility  Bed Mobility Overal bed mobility: Needs Assistance Bed Mobility: Supine to Sit     Supine to sit: HOB elevated;Supervision     General bed mobility comments: increased time; assist for safety  Transfers Overall transfer level: Needs assistance Equipment used: Rolling walker (2 wheeled) Transfers: Sit to/from Stand Sit to Stand: Min guard         General transfer comment: for safety  Ambulation/Gait Ambulation/Gait assistance: Min assist Ambulation Distance (Feet): 400 Feet Assistive device: Rolling walker (2 wheeled) Gait Pattern/deviations: Step-through pattern;Decreased dorsiflexion - right;Step-to pattern;Shuffle     General Gait Details: R LE instability at times and decreased foot clearance, cues for proximity to walker, around turns R leg outside walker  Stairs Stairs: Yes Stairs  assistance: Min assist Stair Management: Two rails;One rail Left;Step to pattern;Alternating pattern;Forwards Number of Stairs: 10 General stair comments: assist for safety at times not clearing step with R foot, cues for step to pattern up, but pt already halfway up,  assist for R hand to reach rail; step to on way down after demo with one rail L and min A  Wheelchair Mobility    Modified Rankin (Stroke Patients Only) Modified Rankin (Stroke Patients Only) Pre-Morbid Rankin Score: No significant disability Modified Rankin: Moderately severe disability     Balance Overall balance assessment: Needs assistance   Sitting balance-Leahy Scale: Good     Standing balance support: Bilateral upper extremity supported Standing balance-Leahy Scale: Poor Standing balance comment: UE support for balance                             Pertinent Vitals/Pain Pain Assessment: No/denies pain    Home Living Family/patient expects to be discharged to:: Private residence Living Arrangements: Spouse/significant other Available Help at Discharge: Family;Available 24 hours/day Type of Home: House Home Access: Stairs to enter Entrance Stairs-Rails: Left;Right;Can reach both Entrance Stairs-Number of Steps: 3 to sidewalk, 2 to front door Home Layout: Two level;Bed/bath upstairs Home Equipment: Shower seat - built in;Cane - single point      Prior Function Level of Independence: Independent         Comments: just trouble with handwriting     Hand Dominance   Dominant Hand: Right    Extremity/Trunk Assessment   Upper Extremity Assessment Upper Extremity Assessment: RUE deficits/detail RUE Deficits / Details: decreased coordination with FNF, pron/sup and opposition  Lower Extremity Assessment Lower Extremity Assessment: RLE deficits/detail RLE Deficits / Details: AROM WFL, strength hip flexion 3-/5, knee extension 4/5, ankle DF 4-/5; coordination decreased with toe tapping  as compared to L RLE Sensation: decreased proprioception (noted in function)       Communication   Communication: No difficulties  Cognition Arousal/Alertness: Awake/alert Behavior During Therapy: WFL for tasks assessed/performed Overall Cognitive Status: Within Functional Limits for tasks assessed                                        General Comments General comments (skin integrity, edema, etc.): daughter in room, spouse and other daugther arrived during session; discussed d/c options and they report able to have 24 hour assist at home and prefer HHPT    Exercises     Assessment/Plan    PT Assessment Patient needs continued PT services  PT Problem List Decreased strength;Decreased mobility;Decreased safety awareness;Decreased balance;Decreased knowledge of use of DME;Impaired sensation;Decreased coordination       PT Treatment Interventions DME instruction;Gait training;Balance training;Stair training;Functional mobility training;Neuromuscular re-education;Therapeutic exercise;Patient/family education;Therapeutic activities    PT Goals (Current goals can be found in the Care Plan section)  Acute Rehab PT Goals Patient Stated Goal: To return to independent PT Goal Formulation: With patient/family Time For Goal Achievement: 11/16/16 Potential to Achieve Goals: Good    Frequency Min 4X/week   Barriers to discharge        Co-evaluation               AM-PAC PT "6 Clicks" Daily Activity  Outcome Measure Difficulty turning over in bed (including adjusting bedclothes, sheets and blankets)?: None Difficulty moving from lying on back to sitting on the side of the bed? : A Little Difficulty sitting down on and standing up from a chair with arms (e.g., wheelchair, bedside commode, etc,.)?: Total Help needed moving to and from a bed to chair (including a wheelchair)?: A Little Help needed walking in hospital room?: A Little Help needed climbing 3-5 steps  with a railing? : A Little 6 Click Score: 17    End of Session Equipment Utilized During Treatment: Gait belt Activity Tolerance: Patient tolerated treatment well Patient left: in bed;with call bell/phone within reach;with family/visitor present   PT Visit Diagnosis: Other abnormalities of gait and mobility (R26.89);Hemiplegia and hemiparesis Hemiplegia - Right/Left: Right Hemiplegia - dominant/non-dominant: Dominant Hemiplegia - caused by: Cerebral infarction    Time: 1200-1229 PT Time Calculation (min) (ACUTE ONLY): 29 min   Charges:   PT Evaluation $PT Eval Moderate Complexity: 1 Mod PT Treatments $Gait Training: 8-22 mins   PT G CodesMagda Kiel, [place] [phone] 11/02/2016   Reginia Naas 11/02/2016, 1:27 PM

## 2016-11-02 NOTE — Progress Notes (Signed)
STROKE TEAM PROGRESS NOTE   SUBJECTIVE (INTERVAL HISTORY) Her daughter is at the bedside.  The patient had TEE and unremarkable. Loop recorder placed. Dr. Oneida Alar is going to do left CEA tomorrow.      OBJECTIVE Temp:  [97.5 F (36.4 C)-98.7 F (37.1 C)] 98.7 F (37.1 C) (07/31 1400) Pulse Rate:  [57-71] 61 (07/31 1400) Cardiac Rhythm: Sinus bradycardia (07/31 0700) Resp:  [16-23] 17 (07/31 1400) BP: (124-199)/(57-107) 155/72 (07/31 1400) SpO2:  [96 %-100 %] 98 % (07/31 1400) Weight:  [162 lb (73.5 kg)] 162 lb (73.5 kg) (07/31 0839)  CBC:   Recent Labs Lab 10/31/16 1055 10/31/16 1106 11/01/16 0341  WBC 9.6  --  7.0  NEUTROABS 6.3  --  3.6  HGB 14.1 14.3 13.0  HCT 42.0 42.0 38.5  MCV 93.3  --  93.2  PLT 277  --  725    Basic Metabolic Panel:   Recent Labs Lab 11/01/16 0341 11/02/16 0658  NA 139 143  K 3.6 3.9  CL 109 110  CO2 23 24  GLUCOSE 103* 108*  BUN 8 9  CREATININE 0.68 0.77  CALCIUM 8.5* 8.9    Lipid Panel:     Component Value Date/Time   CHOL 147 11/01/2016 0341   TRIG 82 11/01/2016 0341   HDL 42 11/01/2016 0341   CHOLHDL 3.5 11/01/2016 0341   VLDL 16 11/01/2016 0341   LDLCALC 89 11/01/2016 0341   HgbA1c:  Lab Results  Component Value Date   HGBA1C 5.7 (H) 11/01/2016   Urine Drug Screen:     Component Value Date/Time   LABOPIA NONE DETECTED 10/31/2016 1541   COCAINSCRNUR NONE DETECTED 10/31/2016 1541   LABBENZ NONE DETECTED 10/31/2016 1541   AMPHETMU NONE DETECTED 10/31/2016 1541   THCU NONE DETECTED 10/31/2016 1541   LABBARB NONE DETECTED 10/31/2016 1541    Alcohol Level     Component Value Date/Time   ETH <5 10/31/2016 1055    IMAGING I have personally reviewed the radiological images below and agree with the radiology interpretations.  Ct Head Wo Contrast 10/31/2016 IMPRESSION: 1. Negative for bleed or other acute process. 2. Patchy left frontal parietal white matter hypointensities more conspicuous than on prior  study.  Ct Angio Head W Or Wo Contrast Ct Angio Neck W Or Wo Contrast 11/01/2016 IMPRESSION: 1. Penetrating Aortic Ulcer (PAU) with progression since March, now measuring up to 14 mm. See series 9, image 171 and series 12, image 127. 2. Negative for large vessel occlusion. Mild progression of bulky soft plaque at the left ICA origin since March, with 60% proximal Left ICA stenosis. No other large vessel stenosis. 3. Mild to moderate atherosclerotic irregularity of the left MCA and left greater than right PCA branches. 4. Continued stable non contrast CT appearance of the brain.    Mr Brain Limited Wo Contrast 10/31/2016 IMPRESSION: 1. Scattered small acute infarcts in the left cerebral hemisphere, greatest in the posterior frontal lobe near the vertex. 2. Mild chronic small vessel ischemic disease.  2D Echo 06/28/2016 Study Conclusions - Left ventricle: The cavity size was normal. There was mild concentric hypertrophy. Systolic function was normal. The estimated ejection fraction was in the range of 55% to 60%. Wall motion was normal; there were no regional wall motion abnormalities. Doppler parameters are consistent with abnormal left ventricular relaxation (grade 1 diastolic dysfunction).  Doppler parameters are consistent with high ventricular filling pressure. - Aortic valve: Transvalvular velocity was within the normal range.  There was no  stenosis. There was no regurgitation. - Mitral valve: Transvalvular velocity was within the normal range.  There was no evidence for stenosis. There was no regurgitation. - Left atrium: The atrium was moderately dilated. - Right ventricle: The cavity size was normal. Wall thickness was normal. Systolic function was normal. - Atrial septum: No defect or patent foramen ovale was identified. - Tricuspid valve: There was mild regurgitation. - Pulmonary arteries: Systolic pressure was within the normal   range. PA peak pressure: 30 mm Hg (S).  VAS [place]  LE 06/28/2016 Summary: - No evidence of deep vein or superficial thrombosis involving the right lower extremity and left lower extremity.  TEE Normal LV function; no LAA thrombus; atrial septal aneurysm; negative saline microcavitation study.   PHYSICAL EXAM  Temp:  [97.5 F (36.4 C)-98.7 F (37.1 C)] 98.7 F (37.1 C) (07/31 1400) Pulse Rate:  [57-71] 61 (07/31 1400) Resp:  [16-23] 17 (07/31 1400) BP: (124-199)/(57-107) 155/72 (07/31 1400) SpO2:  [96 %-100 %] 98 % (07/31 1400) Weight:  [162 lb (73.5 kg)] 162 lb (73.5 kg) (07/31 0839)  General - Well nourished, well developed, in no apparent distress.  Ophthalmologic - Sharp disc margins OU.   Cardiovascular - Regular rate and rhythm.  Mental Status -  Level of arousal and orientation to time, place, and person were intact. Language including expression, naming, repetition, comprehension was assessed and found intact. Attention span and concentration were normal. Fund of Knowledge was assessed and was intact.  Cranial Nerves II - XII - II - Visual field intact OU. III, IV, VI - Extraocular movements intact. V - Facial sensation intact bilaterally. VII - Facial movement intact bilaterally. VIII - Hearing & vestibular intact bilaterally. X - Palate elevates symmetrically. XI - Chin turning & shoulder shrug intact bilaterally. XII - Tongue protrusion intact.  Motor Strength - The patient's strength was normal in all extremities except right hand dexterity difficulty and and pronator drift was present, as well as right foot DF 4+/5.  Bulk was normal and fasciculations were absent.   Motor Tone - Muscle tone was assessed at the neck and appendages and was normal.  Reflexes - The patient's reflexes were 1+ in all extremities and she had no pathological reflexes.  Sensory - Light touch, temperature/pinprick were assessed and were symmetrical.    Coordination - The patient had normal movements in the hands with no ataxia or  dysmetria.  Tremor was absent.  Gait and Station - deferred.   ASSESSMENT/PLAN Regina James is a 83 y.o. female with history of cancer, history PSVT, palpitations, tobacco abuse, CVA in March, 2018 with residual proximal RUE weakness, HTN, who presented with right sided weakness. She did not receive IV t-PA due to arriving outside of the treatment window/presenting without cortical signs.  Stroke:  Scattered small acute infarcts in the left MCA and ACA, likely embolic, due to left ICA soft plaque with stenosis vs. cardioembolic with PSVT and suspicious for afib.  Resultant  Right hand and right foot mild weakness  CT head: Patchy left frontal parietal white matter hypointensities more conspicuous than on prior study.  MRI head: Scattered small acute infarcts in the left cerebral hemisphere, greatest in the posterior frontal lobe near the vertex.   CTA: increased left proximal ICA soft plaque and stenosis now at 60%. No LVO or high-grade stenosis.  14 mm penetrating aortic ulcer.  2D Echo (06/28/2016): EF 60-65%. No source of embolus.   TEE unremarkable   Loop recorder placed  LDL 89  HgbA1c 5.7  SCDs for VTE prophylaxis Diet Heart Room service appropriate? Yes; Fluid consistency: Thin Diet NPO time specified Except for: Sips with Meds  clopidogrel 75 mg daily prior to admission, now on aspirin 325 mg daily and clopidogrel 75 mg daily. Continue DAPT for 3 months and then plavix alone.  Patient counseled to be compliant with her antithrombotic medications  Ongoing aggressive stroke risk factor management  Therapy recommendations:  pending  Disposition:  Pending  Left ICA stenosis with soft plaque  Could be the reason for recurrent left MCA / ACA infarcts  vascular surgery plans for left CEA in am  Continue DAPT  Aortic ulceration   Distal to the left CCA take off - unlikely the cause of her stroke  recommend vascular surgery or CVST consultation for further  monitoring and management  Hx of stroke  06/2016 right hand weakness - CTA head negative - CTA head left ICA 40-50% stenosis, aortic ulceration - MRI scattered left MCA infarcts - LDL 95 and A1C 5.6 - changed ASA to plavix and add lipitor - recommend outpt TEE and loop  08/2016 followed up in [place] with Dr. Leta Baptist - arranged TEE and loop but pt did not answer the arrangement with cardiology  Pt also noncompliant with plavix and lipitor  Hypertension  Unstable  Permissive hypertension (OK if < 220/120) but gradually normalize in 5-7 days  Long-term BP goal normotensive  Hyperlipidemia  Home meds: atorvastatin 20mg , not taking at home  LDL 89, goal < 70  Will change to pravastatin 20mg  for compliance  Continue statin at discharge   Tobacco abuse  Current smoker  Smoking cessation counseling provided  Nicotine patch provided  Pt is willing to quit  Other Stroke Risk Factors  Advanced age  ETOH use, advised to drink no more than 1 drink(s) a day  Other Active Problems  None  Hospital day # 2  Regina Hawking, MD PhD Stroke Neurology 11/02/2016 5:40 PM   To contact Stroke Continuity provider, please refer to [email or web address] After hours, contact General Neurology

## 2016-11-02 NOTE — Consult Note (Signed)
[facility]  Subjective  - feels ok   Objective 137/65 63 (!) 97.5 F (36.4 C) (Oral) (!) 21 98%  Intake/Output Summary (Last 24 hours) at 11/02/16 1107 Last data filed at 11/02/16 0000  Gross per 24 hour  Intake              180 ml  Output                0 ml  Net              180 ml   Neuro 5/5 motor ue/le  Assessment/Planning: Pt CT neck and head and MRI head reviewed.  Recurrent left brain embolic event with progression of left carotid disease.  Difficult to know if arrhythmia arch or carotid source but I believe would benefit from left CEA.  Will schedule for tomorrow.  Risk benefits possible complications including but not limited to bleeding infection stroke 1-2%.  Cranial nerve injury 10%.  She understands and wishes to proceed.  NPO p midnight Consent  Ruta Hinds 11/02/2016 11:07 AM --  Laboratory Lab Results:  Recent Labs  10/31/16 1055 10/31/16 1106 11/01/16 0341  WBC 9.6  --  7.0  HGB 14.1 14.3 13.0  HCT 42.0 42.0 38.5  PLT 277  --  262   BMET  Recent Labs  11/01/16 0341 11/02/16 0658  NA 139 143  K 3.6 3.9  CL 109 110  CO2 23 24  GLUCOSE 103* 108*  BUN 8 9  CREATININE 0.68 0.77  CALCIUM 8.5* 8.9    COAG Lab Results  Component Value Date   INR 1.06 11/02/2016   INR 1.10 10/31/2016   INR 1.04 06/27/2016   No results found for: PTT

## 2016-11-02 NOTE — Care Management Note (Signed)
Case Management Note  Patient Details  Name: Regina James MRN: [id number] Date of Birth: 05/19/1933  Subjective/Objective:  Pt admitted with CVA. She is from home with her spouse.                   Action/Plan: Plan is for [facility] tomorrow. Currently PT recommending HHPT and walker. CM following for d/c needs, physician orders.   Expected Discharge Date:                  Expected Discharge Plan:     In-House Referral:     Discharge planning Services     Post Acute Care Choice:    Choice offered to:     DME Arranged:    DME Agency:     HH Arranged:    HH Agency:     Status of Service:  In process, will continue to follow  If discussed at Long Length of Stay Meetings, dates discussed:    Additional Comments:  Pollie Friar, RN 11/02/2016, 2:12 PM

## 2016-11-02 NOTE — Telephone Encounter (Signed)
Closing encounter.  Pt went to hospital over the weekend and had TEE.

## 2016-11-02 NOTE — CV Procedure (Signed)
    Transesophageal Echocardiogram Note  RIYANSHI WAHAB [id number] Jun 26, 1933  Procedure: Transesophageal Echocardiogram Indications: CVA  Procedure Details Consent: Obtained Time Out: Verified patient identification, verified procedure, site/side was marked, verified correct patient position, special equipment/implants available, Radiology Safety Procedures followed,  medications/allergies/relevent history reviewed, required imaging and test results available.  Performed  Medications:  During this procedure the patient is administered a total of Versed 4 mg and Fentanyl 25 mcg  to achieve and maintain moderate conscious sedation.  The patient's heart rate, blood pressure, and oxygen saturation are monitored continuously during the procedure. The period of conscious sedation is 30 minutes, of which I was present face-to-face 100% of this time.  Normal LV function; no LAA thrombus; atrial septal aneurysm; negative saline microcavitation study.   Complications: No apparent complications Patient did tolerate procedure well.  Kirk Ruths, MD

## 2016-11-02 NOTE — Progress Notes (Signed)
  Echocardiogram 2D Echocardiogram has been performed.  Regina James [name] 11/02/2016, 9:45 AM

## 2016-11-02 NOTE — Consult Note (Signed)
ELECTROPHYSIOLOGY CONSULT NOTE  Patient ID: Regina James MRN: [id number], DOB/AGE: [birth year]   Admit date: 10/31/2016 Date of Consult: 11/02/2016  Primary Physician: Kathyrn Lass, MD Primary Cardiologist: Dr Ellyn Hack Reason for Consultation: Cryptogenic stroke; recommendations regarding Implantable Loop Recorder (Dr Erlinda Hong consulting)  History of Present Illness Regina James was admitted on 10/31/2016 with acute CVA. she has been monitored on telemetry which has demonstrated no arrhythmias. No cause has been identified. She is making slow recovery.  This is her second stroke.  She had prior stroke in March.  She has been found to have scattered small acute infarcts in  The L MCA and ACA, likely embolic per Dr Erlinda Hong. She has a h/o SVT but no known afib.  Inpatient stroke work-up is to be completed with a TEE. EP has been asked to evaluate for placement of an implantable loop recorder to monitor for atrial fibrillation.  Past Medical History Past Medical History:  Diagnosis Date  . Cancer ([place])    basal cell carcinoma  . History of PSVT (paroxysmal supraventricular tachycardia)    Reported back as far as 2010.  Marland Kitchen Palpitations   . Stroke [facility])     Past Surgical History Past Surgical History:  Procedure Laterality Date  . APPENDECTOMY  1993  . CHOLECYSTECTOMY  1996  . ROTATOR CUFF REPAIR Right 1990  . US ECHOCARDIOGRAPHY  01/09/2009   EF 55-60%  . WRIST FRACTURE SURGERY      Allergies/Intolerances Allergies  Allergen Reactions  . Morphine And Related Swelling  . Levofloxacin Anxiety and Other (See Comments)    Double vision   Inpatient Medications . aspirin EC  325 mg Oral Daily  . clopidogrel  75 mg Oral Daily  . enoxaparin (LOVENOX) injection  40 mg Subcutaneous Q24H  . metoprolol succinate  50 mg Oral Daily  . multivitamin with minerals  1 tablet Oral Daily  . nicotine  14 mg Transdermal Daily  . pravastatin  20 mg Oral q1800    Social History Social History   Social  History  . Marital status: Married    Spouse name: Wouter  . Number of children: 3  . Years of education: 14   Occupational History  .      retired, travel Music therapist   Social History Main Topics  . Smoking status: Former Smoker    Packs/day: 0.25    Years: 45.00    Types: Cigarettes    Quit date: 06/10/2016  . Smokeless tobacco: Never Used  . Alcohol use 4.2 oz/week    7 Glasses of wine per week     Comment: 1 glass wine daily  . Drug use: No  . Sexual activity: Not on file   Other Topics Concern  . Not on file   Social History Narrative   Lives at home w/husband   Caffeine- coffee, 2 cups daily    Review of Systems General: No chills, fever, night sweats or weight changes  Cardiovascular:  No chest pain, dyspnea on exertion, edema, orthopnea, palpitations, paroxysmal nocturnal dyspnea Dermatological: No rash, lesions or masses Respiratory: No cough, dyspnea Urologic: No hematuria, dysuria Abdominal: No nausea, vomiting, diarrhea, bright red blood per rectum, melena, or hematemesis Neurologic: No visual changes, weakness, changes in mental status All other systems reviewed and are otherwise negative except as noted above.  Physical Exam Blood pressure 124/80, pulse 62, temperature 98 F (36.7 C), temperature source Oral, resp. rate 16, SpO2 96 %.  General: Well developed, well appearing 83  y.o. female in no acute distress. HEENT: Normocephalic, atraumatic. EOMs intact. Sclera nonicteric. Oropharynx clear.  Neck: Supple without bruits. No JVD. Lungs: Respirations regular and unlabored, CTA bilaterally. No wheezes, rales or rhonchi. Heart: RRR. S1, S2 present. No murmurs, rub, S3 or S4. Abdomen: Soft, non-tender, non-distended. BS present x 4 quadrants. No hepatosplenomegaly.  Extremities: No clubbing, cyanosis or edema. DP/PT/Radials 2+ and equal bilaterally. Psych: Normal affect. Neuro: Alert and oriented X 3. Moves all extremities spontaneously. Musculoskeletal: No  kyphosis. Skin: Intact. Warm and dry. No rashes or petechiae in exposed areas.   Labs Lab Results  Component Value Date   WBC 7.0 11/01/2016   HGB 13.0 11/01/2016   HCT 38.5 11/01/2016   MCV 93.2 11/01/2016   PLT 262 11/01/2016    Recent Labs Lab 10/31/16 1055  11/01/16 0341  NA 143  < > 139  K 3.9  < > 3.6  CL 110  < > 109  CO2 25  --  23  BUN 8  < > 8  CREATININE 0.75  < > 0.68  CALCIUM 9.1  --  8.5*  PROT 7.0  --   --   BILITOT 0.8  --   --   ALKPHOS 106  --   --   ALT 38  --   --   AST 37  --   --   GLUCOSE 98  < > 103*  < > = values in this interval not displayed.  Recent Labs  11/02/16 0658  INR 1.06    Radiology/Studies Ct Angio Head W Or Wo Contrast  Addendum Date: 11/01/2016   ADDENDUM REPORT: 11/01/2016 13:16 ADDENDUM: Study discussed by telephone with Dr. [name] on 11/01/2016 at 1249 hours. Electronically Signed   By: Genevie Ann M.D.   On: 11/01/2016 13:16   Result Date: 11/01/2016 CLINICAL DATA:  83 year old female found have scattered acute infarcts in the left hemisphere following presentation of right side weakness. Multiple left side infarcts also in March of this year. EXAM: CT ANGIOGRAPHY HEAD AND NECK TECHNIQUE: Multidetector CT imaging of the head and neck was performed using the standard protocol during bolus administration of intravenous contrast. Multiplanar CT image reconstructions and MIPs were obtained to evaluate the vascular anatomy. Carotid stenosis measurements (when applicable) are obtained utilizing NASCET criteria, using the distal internal carotid diameter as the denominator. CONTRAST:  50 mL Isovue 370 COMPARISON:  Brain MRI 10/31/2016. CT head 10/31/2016. CTA head and neck 06/27/2016 FINDINGS: CT HEAD Brain: No acute intracranial hemorrhage identified. No midline shift, mass effect, or evidence of intracranial mass lesion. Gray-white matter differentiation remains stable by CT in the left hemisphere. No ventriculomegaly. Calvarium and  skull base: Stable. No acute osseous abnormality identified. Paranasal sinuses: Visualized paranasal sinuses and mastoids are stable and well pneumatized. Orbits: No acute orbit or scalp soft tissue findings. CTA NECK Skeleton: No acute osseous abnormality identified. Cervical spine degeneration. Partially visible thoracic dextroconvex scoliosis. Chronic or congenital fusion of the right second and third ribs. Upper chest: Negative lung apices. No superior mediastinal lymphadenopathy. Other neck: Stable, negative.  No cervical lymphadenopathy. Aortic arch: Progressed since March and now 14 mm penetrating ulcer of the lateral aspect of the distal aortic arch (series 9, image 171 today versus series 5 image 169 previously. See also coronal series 12, image 127. Otherwise stable 3 vessel arch configuration with mostly soft aortic arch atherosclerosis. Right carotid system: Stable since March with intermittent right CCA and cervical right ICA soft plaque but  no significant stenosis. Left carotid system: Negative left CCA origin. Medial and posterior wall soft plaque in the left CCA is stable since March. Confluent soft plaque in the left ICA origin and bulb results in proximal left ICA stenosis of 60 % with respect to the distal vessel and is mildly progressed since March. See series 9, image 107 and series 12, images 112 and 113). Distal to the bulb the cervical left ICA is stable and negative. Vertebral arteries:Stable proximal subclavian arteries and vertebral artery origins without significant stenosis. Dominant left vertebral artery. The distal right vertebral artery today is partially obscured by paravertebral venous contrast. No cervical vertebral arteries stenosis. CTA HEAD Posterior circulation: Dominant distal left vertebral artery supplies the basilar and the diminutive right V4 segment functionally terminates in PICA. Left PICA origin remains patent. Tortuous basilar artery without stenosis is stable. SCA  and PCA origins are stable, with a fetal type right PCA origin. The left posterior communicating artery is diminutive or absent. Bilateral PCA branches are stable with mild distal left P2 in bilateral P3 segment irregularity. Anterior circulation: Stable ICA siphons with mild for age calcified plaque and no siphon stenosis. Patent carotid termini. MCA and ACA origins remain normal. Mildly dominant left A1 segment. Anterior communicating artery and bilateral ACA branches are stable. Right MCA M1 segment, bifurcation, and right MCA branches are stable and within normal limits. Left MCA M1 segment remains patent without stenosis. Left MCA trifurcation is patent. Left MCA branches appear stable since March with attenuated anterior sylvian branches, and mild irregularity in the dominant posterior branches. No interval left MCA branch occlusion. Venous sinuses: Patent. Anatomic variants: Dominant left vertebral artery. The distal right vertebral terminates in PICA. Fetal type right PCA origin. Delayed phase: No abnormal enhancement identified. Review of the MIP images confirms the above findings IMPRESSION: 1. Penetrating Aortic Ulcer (PAU) with progression since March, now measuring up to 14 mm. See series 9, image 171 and series 12, image 127. 2. Negative for large vessel occlusion. Mild progression of bulky soft plaque at the left ICA origin since March, with 60% proximal Left ICA stenosis. No other large vessel stenosis. 3. Mild to moderate atherosclerotic irregularity of the left MCA and left greater than right PCA branches. 4. Continued stable non contrast CT appearance of the brain. Electronically Signed: By: Genevie Ann M.D. On: 11/01/2016 12:46   Ct Head Wo Contrast  Result Date: 10/31/2016 CLINICAL DATA:  Pt wok up with Crump of the right leg PT reports waking at 0400 With Rt leg cramp. Pt reports she had a limp since the cramp. EXAM: CT HEAD WITHOUT CONTRAST TECHNIQUE: Contiguous axial images were obtained from  the base of the skull through the vertex without intravenous contrast. COMPARISON:  06/27/2016 FINDINGS: Brain: Patchy areas of hypoattenuation in left frontal and parietal white matter are more conspicuous than on previous exam. Negative for acute intracranial hemorrhage, midline shift, mass, mass effect, hydrocephalus, or extra-axial collection. Acute infarct may be inapparent on noncontrast CT. Vascular: Atherosclerotic and physiologic intracranial calcifications. Skull: Normal. Negative for fracture or focal lesion. Sinuses/Orbits: No acute finding. Other: None. IMPRESSION: 1. Negative for bleed or other acute process. 2. Patchy left frontal parietal white matter hypointensities more conspicuous than on prior study. Electronically Signed   By: Lucrezia Europe M.D.   On: 10/31/2016 12:00   Ct Angio Neck W Or Wo Contrast  Addendum Date: 11/01/2016   ADDENDUM REPORT: 11/01/2016 13:16 ADDENDUM: Study discussed by telephone with Dr. [name]  on 11/01/2016 at 1249 hours. Electronically Signed   By: Genevie Ann M.D.   On: 11/01/2016 13:16   Result Date: 11/01/2016 CLINICAL DATA:  83 year old female found have scattered acute infarcts in the left hemisphere following presentation of right side weakness. Multiple left side infarcts also in March of this year. EXAM: CT ANGIOGRAPHY HEAD AND NECK TECHNIQUE: Multidetector CT imaging of the head and neck was performed using the standard protocol during bolus administration of intravenous contrast. Multiplanar CT image reconstructions and MIPs were obtained to evaluate the vascular anatomy. Carotid stenosis measurements (when applicable) are obtained utilizing NASCET criteria, using the distal internal carotid diameter as the denominator. CONTRAST:  50 mL Isovue 370 COMPARISON:  Brain MRI 10/31/2016. CT head 10/31/2016. CTA head and neck 06/27/2016 FINDINGS: CT HEAD Brain: No acute intracranial hemorrhage identified. No midline shift, mass effect, or evidence of intracranial  mass lesion. Gray-white matter differentiation remains stable by CT in the left hemisphere. No ventriculomegaly. Calvarium and skull base: Stable. No acute osseous abnormality identified. Paranasal sinuses: Visualized paranasal sinuses and mastoids are stable and well pneumatized. Orbits: No acute orbit or scalp soft tissue findings. CTA NECK Skeleton: No acute osseous abnormality identified. Cervical spine degeneration. Partially visible thoracic dextroconvex scoliosis. Chronic or congenital fusion of the right second and third ribs. Upper chest: Negative lung apices. No superior mediastinal lymphadenopathy. Other neck: Stable, negative.  No cervical lymphadenopathy. Aortic arch: Progressed since March and now 14 mm penetrating ulcer of the lateral aspect of the distal aortic arch (series 9, image 171 today versus series 5 image 169 previously. See also coronal series 12, image 127. Otherwise stable 3 vessel arch configuration with mostly soft aortic arch atherosclerosis. Right carotid system: Stable since March with intermittent right CCA and cervical right ICA soft plaque but no significant stenosis. Left carotid system: Negative left CCA origin. Medial and posterior wall soft plaque in the left CCA is stable since March. Confluent soft plaque in the left ICA origin and bulb results in proximal left ICA stenosis of 60 % with respect to the distal vessel and is mildly progressed since March. See series 9, image 107 and series 12, images 112 and 113). Distal to the bulb the cervical left ICA is stable and negative. Vertebral arteries:Stable proximal subclavian arteries and vertebral artery origins without significant stenosis. Dominant left vertebral artery. The distal right vertebral artery today is partially obscured by paravertebral venous contrast. No cervical vertebral arteries stenosis. CTA HEAD Posterior circulation: Dominant distal left vertebral artery supplies the basilar and the diminutive right V4  segment functionally terminates in PICA. Left PICA origin remains patent. Tortuous basilar artery without stenosis is stable. SCA and PCA origins are stable, with a fetal type right PCA origin. The left posterior communicating artery is diminutive or absent. Bilateral PCA branches are stable with mild distal left P2 in bilateral P3 segment irregularity. Anterior circulation: Stable ICA siphons with mild for age calcified plaque and no siphon stenosis. Patent carotid termini. MCA and ACA origins remain normal. Mildly dominant left A1 segment. Anterior communicating artery and bilateral ACA branches are stable. Right MCA M1 segment, bifurcation, and right MCA branches are stable and within normal limits. Left MCA M1 segment remains patent without stenosis. Left MCA trifurcation is patent. Left MCA branches appear stable since March with attenuated anterior sylvian branches, and mild irregularity in the dominant posterior branches. No interval left MCA branch occlusion. Venous sinuses: Patent. Anatomic variants: Dominant left vertebral artery. The distal right vertebral terminates in  PICA. Fetal type right PCA origin. Delayed phase: No abnormal enhancement identified. Review of the MIP images confirms the above findings IMPRESSION: 1. Penetrating Aortic Ulcer (PAU) with progression since March, now measuring up to 14 mm. See series 9, image 171 and series 12, image 127. 2. Negative for large vessel occlusion. Mild progression of bulky soft plaque at the left ICA origin since March, with 60% proximal Left ICA stenosis. No other large vessel stenosis. 3. Mild to moderate atherosclerotic irregularity of the left MCA and left greater than right PCA branches. 4. Continued stable non contrast CT appearance of the brain. Electronically Signed: By: Genevie Ann M.D. On: 11/01/2016 12:46   Mr Brain Limited Wo Contrast  Result Date: 10/31/2016 CLINICAL DATA:  Right sided weakness, increased in the upper extremity and new in the  lower extremity. Prior stroke with residual right-sided weakness. EXAM: MRI HEAD WITHOUT CONTRAST TECHNIQUE: Multiplanar, multiecho pulse sequences of the brain and surrounding structures were obtained without intravenous contrast. COMPARISON:  Head CT 10/31/2016 and MRI 06/27/2016 FINDINGS: Brain: Patchy, small areas of acute infarction are present in the high posterior left frontal lobe, predominantly involving cortex. Additional scattered punctate acute infarcts are present more anteriorly in the left centrum semiovale as well as CED inferior left parietal white matter and left lateral temporal occipital junction cortex. Scattered small chronic cortical infarcts are present in the posterior left cerebral hemisphere corresponding to some of the acute infarcts on the prior MRI. There is a focal area of hemosiderin staining in the superior aspect of the left occipital lobe which is sulcal and suggestive of prior subarachnoid hemorrhage, close to a chronic infarct. There is also a focus of chronic microhemorrhage more superiorly in the left parietal lobe. There is mild cerebral atrophy. No mass, midline shift, or extra-axial fluid collection is seen. Small foci of T2 hyperintensity scattered throughout the cerebral white matter bilaterally are nonspecific but compatible with chronic small vessel ischemic disease, relatively mild for age Vascular: Major intracranial vascular flow voids are preserved. Skull and upper cervical spine: Unremarkable bone marrow signal. Sinuses/Orbits: Bilateral cataract extraction. Paranasal sinuses and mastoid air cells are clear. Other: None. IMPRESSION: 1. Scattered small acute infarcts in the left cerebral hemisphere, greatest in the posterior frontal lobe near the vertex. 2. Mild chronic small vessel ischemic disease. Electronically Signed   By: Logan Bores M.D.   On: 10/31/2016 13:22   US Thyroid  Result Date: 10/05/2016 CLINICAL DATA:  Prior ultrasound follow-up. EXAM: THYROID  ULTRASOUND TECHNIQUE: Ultrasound examination of the thyroid gland and adjacent soft tissues was performed. COMPARISON:  05/14/2014 FINDINGS: Parenchymal Echotexture: Moderately heterogenous Isthmus: Normal in size measures 0.2 cm in diameter, unchanged Right lobe: Slightly diminutive in size measuring 3.4 x 1.5 x 1.7 cm, unchanged, previously, 3.5 x 1.4 x 2.0 cm Left lobe: Diminutive in size measuring 2.6 x 1.0 x 1.1 cm, unchanged, previously, 2.3 x 0.6 x 1.2 cm. _________________________________________________________ Estimated total number of nodules >/= 1 cm: 0 Number of spongiform nodules >/=  2 cm not described below (TR1): 0 Number of mixed cystic and solid nodules >/= 1.5 cm not described below ([place]): 0 _________________________________________________________ Spongiform appearing nodule within the mid aspect the right lobe of the thyroid is decreased in size in the interval, currently measuring 0.7 x 0.5 x 0.7 cm, previously, 1.0 x 0.6 x 0.7 cm. This nodule does not meet imaging criteria to recommend percutaneous sampling or dedicated follow-up. An additional spongiform appearing nodule with the mid aspect the right  lobe of the thyroid is unchanged, currently measuring 0.8 x 0.7 x 0.8 cm, previously, 0.9 x 0.7 x 0.8 cm. This nodule does not meet imaging criteria to recommend percutaneous sampling or dedicated follow-up. Spongiform appearing nodule within the posterior inferior aspect of the right lobe of the thyroid is unchanged to decreased in size the interval, currently measuring 0.9 x 0.6 x 0.6 cm, previously, 1.1 x 0.7 x 1.2 cm. This nodule does not meet imaging criteria to recommend percutaneous sampling or dedicated follow-up. There is a punctate (approximately 0.3 cm) anechoic cyst within the inferior aspect the left lobe of the thyroid which does not meet imaging criteria to recommend percutaneous sampling or dedicated follow-up. IMPRESSION: 1. No new or enlarging thyroid nodules. 2. All discretely  measured thyroid nodules are unchanged to decreased in size compared to the 05/2014 examination. None of the discretely measured thyroid nodules meet imaging criteria to recommend percutaneous sampling or dedicated follow-up. The above is in keeping with the ACR TI-RADS recommendations - J Am Coll Radiol 2017;14:587-595. Electronically Signed   By: Sandi Mariscal M.D.   On: 10/05/2016 16:25    Echocardiogram  March 26,2018 echo is reviewed.  EF is preserved.  No significant valvular disease  12-lead ECG all ekgs in epic are reviewed,  Sinus rhythm is observed on all Telemetry sinus rhythm, no SVT or Afib   Assessment and Plan 1. Cryptogenic stroke The patient presents with recurrent strokes.  Her strokes are multiple territory and felt to possibly be cardioembolic in nature.  TEE has been ordered. If the TEE is negative, I recommend loop recorder insertion to monitor for AF. The indication for loop recorder insertion / monitoring for AF in setting of cryptogenic stroke was discussed with the patient. The loop recorder insertion procedure was reviewed in detail including risks and benefits. These risks include but are not limited to bleeding and infection. The patient expressed verbal understanding and agrees to proceed. The patient was also counseled regarding wound care and device follow-up.  Army Fossa MD 11/02/2016, 7:39 AM

## 2016-11-02 NOTE — Interval H&P Note (Signed)
History and Physical Interval Note:  11/02/2016 8:46 AM  Regina James  has presented today for surgery, with the diagnosis of stroke  The various methods of treatment have been discussed with the patient and family. After consideration of risks, benefits and other options for treatment, the patient has consented to  Procedure(s): TRANSESOPHAGEAL ECHOCARDIOGRAM (TEE) (N/A) as a surgical intervention .  The patient's history has been reviewed, patient examined, no change in status, stable for surgery.  I have reviewed the patient's chart and labs.  Questions were answered to the patient's satisfaction.     Kirk Ruths

## 2016-11-03 ENCOUNTER — Inpatient Hospital Stay (HOSPITAL_COMMUNITY): Payer: Medicare Other

## 2016-11-03 ENCOUNTER — Encounter (HOSPITAL_COMMUNITY)
Admission: EM | Disposition: A | Discharge status: Skilled Nursing Facility | Payer: Self-pay | Source: Home / Self Care | Attending: Vascular Surgery

## 2016-11-03 ENCOUNTER — Inpatient Hospital Stay (HOSPITAL_COMMUNITY): Payer: Medicare Other | Admitting: Anesthesiology

## 2016-11-03 ENCOUNTER — Encounter (HOSPITAL_COMMUNITY): Payer: Self-pay

## 2016-11-03 DIAGNOSIS — I97621 Postprocedural hematoma of a circulatory system organ or structure following other procedure: Secondary | ICD-10-CM

## 2016-11-03 DIAGNOSIS — I471 Supraventricular tachycardia: Secondary | ICD-10-CM | POA: Diagnosis not present

## 2016-11-03 DIAGNOSIS — I9762 Postprocedural hemorrhage of a circulatory system organ or structure following other procedure: Secondary | ICD-10-CM | POA: Diagnosis not present

## 2016-11-03 DIAGNOSIS — E782 Mixed hyperlipidemia: Secondary | ICD-10-CM | POA: Diagnosis not present

## 2016-11-03 DIAGNOSIS — I6522 Occlusion and stenosis of left carotid artery: Secondary | ICD-10-CM

## 2016-11-03 DIAGNOSIS — I739 Peripheral vascular disease, unspecified: Secondary | ICD-10-CM | POA: Diagnosis not present

## 2016-11-03 HISTORY — PX: ENDARTERECTOMY: SHX5162

## 2016-11-03 LAB — CBC
HCT: 43.1 % (ref 36.0–46.0)
Hemoglobin: 14.7 g/dL (ref 12.0–15.0)
MCH: 31.7 pg (ref 26.0–34.0)
MCHC: 34.1 g/dL (ref 30.0–36.0)
MCV: 92.9 fL (ref 78.0–100.0)
Platelets: 241 10*3/uL (ref 150–400)
RBC: 4.64 MIL/uL (ref 3.87–5.11)
RDW: 14.8 % (ref 11.5–15.5)
WBC: 7.3 10*3/uL (ref 4.0–10.5)

## 2016-11-03 LAB — BASIC METABOLIC PANEL
Anion gap: 10 (ref 5–15)
BUN: 14 mg/dL (ref 6–20)
CO2: 22 mmol/L (ref 22–32)
Calcium: 8.7 mg/dL — ABNORMAL LOW (ref 8.9–10.3)
Chloride: 109 mmol/L (ref 101–111)
Creatinine, Ser: 0.77 mg/dL (ref 0.44–1.00)
GFR calc Af Amer: >60 mL/min (ref >60)
GFR calc non Af Amer: >60 mL/min (ref >60)
Glucose, Bld: 94 mg/dL (ref 65–99)
Potassium: 4.1 mmol/L (ref 3.5–5.1)
Sodium: 141 mmol/L (ref 135–145)

## 2016-11-03 LAB — SURGICAL PCR SCREEN
MRSA, PCR: NEGATIVE
Staphylococcus aureus: NEGATIVE

## 2016-11-03 SURGERY — ENDARTERECTOMY, CAROTID
Anesthesia: General | Site: Neck | Laterality: Left

## 2016-11-03 MED ORDER — HEPARIN SODIUM (PORCINE) 1000 UNIT/ML IJ SOLN
INTRAMUSCULAR | Status: DC | PRN
  Administered 2016-11-03: 7000 [IU] via INTRAVENOUS

## 2016-11-03 MED ORDER — PROPOFOL 500 MG/50ML IV EMUL
INTRAVENOUS | Status: DC | PRN
  Administered 2016-11-03: 25 ug/kg/min via INTRAVENOUS

## 2016-11-03 MED ORDER — LIDOCAINE HCL (PF) 1 % IJ SOLN
INTRAMUSCULAR | Status: AC
  Filled 2016-11-03: qty 30

## 2016-11-03 MED ORDER — PHENYLEPHRINE 40 MCG/ML (10ML) SYRINGE FOR IV PUSH (FOR BLOOD PRESSURE SUPPORT)
PREFILLED_SYRINGE | INTRAVENOUS | Status: DC | PRN
  Administered 2016-11-03: 120 ug via INTRAVENOUS

## 2016-11-03 MED ORDER — 0.9 % SODIUM CHLORIDE (POUR BTL) OPTIME
TOPICAL | Status: DC | PRN
  Administered 2016-11-03 (×3): 1000 mL

## 2016-11-03 MED ORDER — PROTAMINE SULFATE 10 MG/ML IV SOLN
INTRAVENOUS | Status: DC | PRN
  Administered 2016-11-03: 70 mg via INTRAVENOUS

## 2016-11-03 MED ORDER — SUCCINYLCHOLINE CHLORIDE 20 MG/ML IJ SOLN
INTRAMUSCULAR | Status: DC | PRN
  Administered 2016-11-03: 100 mg via INTRAVENOUS

## 2016-11-03 MED ORDER — LACTATED RINGERS IV SOLN
INTRAVENOUS | Status: DC | PRN
  Administered 2016-11-03: 17:00:00 via INTRAVENOUS

## 2016-11-03 MED ORDER — EPHEDRINE SULFATE 50 MG/ML IJ SOLN
INTRAMUSCULAR | Status: DC | PRN
  Administered 2016-11-03: 5 mg via INTRAVENOUS

## 2016-11-03 MED ORDER — EPHEDRINE 5 MG/ML INJ
INTRAVENOUS | Status: AC
  Filled 2016-11-03: qty 10

## 2016-11-03 MED ORDER — DEXAMETHASONE SODIUM PHOSPHATE 10 MG/ML IJ SOLN
INTRAMUSCULAR | Status: DC | PRN
  Administered 2016-11-03: 10 mg via INTRAVENOUS

## 2016-11-03 MED ORDER — FENTANYL CITRATE (PF) 250 MCG/5ML IJ SOLN
INTRAMUSCULAR | Status: AC
  Filled 2016-11-03: qty 5

## 2016-11-03 MED ORDER — LIDOCAINE 2% (20 MG/ML) 5 ML SYRINGE
INTRAMUSCULAR | Status: DC | PRN
  Administered 2016-11-03: 70 mg via INTRAVENOUS

## 2016-11-03 MED ORDER — LIDOCAINE 2% (20 MG/ML) 5 ML SYRINGE
INTRAMUSCULAR | Status: AC
  Filled 2016-11-03: qty 5

## 2016-11-03 MED ORDER — ROCURONIUM BROMIDE 100 MG/10ML IV SOLN
INTRAVENOUS | Status: DC | PRN
  Administered 2016-11-03: 10 mg via INTRAVENOUS
  Administered 2016-11-03: 50 mg via INTRAVENOUS
  Administered 2016-11-03: 10 mg via INTRAVENOUS
  Administered 2016-11-03: 5 mg via INTRAVENOUS

## 2016-11-03 MED ORDER — PHENYLEPHRINE 40 MCG/ML (10ML) SYRINGE FOR IV PUSH (FOR BLOOD PRESSURE SUPPORT)
PREFILLED_SYRINGE | INTRAVENOUS | Status: AC
  Filled 2016-11-03: qty 20

## 2016-11-03 MED ORDER — ONDANSETRON HCL 4 MG/2ML IJ SOLN: 4.0000 mg | Freq: Four times a day (QID) | INTRAMUSCULAR | Status: DC | PRN

## 2016-11-03 MED ORDER — LACTATED RINGERS IV SOLN
INTRAVENOUS | Status: DC
  Administered 2016-11-03 (×5): via INTRAVENOUS

## 2016-11-03 MED ORDER — ONDANSETRON HCL 4 MG/2ML IJ SOLN
INTRAMUSCULAR | Status: DC | PRN
  Administered 2016-11-03: 4 mg via INTRAVENOUS

## 2016-11-03 MED ORDER — PROPOFOL 10 MG/ML IV BOLUS
INTRAVENOUS | Status: DC | PRN
  Administered 2016-11-03: 90 mg via INTRAVENOUS
  Administered 2016-11-03: 110 mg via INTRAVENOUS

## 2016-11-03 MED ORDER — IOPAMIDOL (ISOVUE-370) INJECTION 76%
INTRAVENOUS | Status: AC
  Administered 2016-11-03: 50 mL
  Filled 2016-11-03: qty 50

## 2016-11-03 MED ORDER — MEPERIDINE HCL 25 MG/ML IJ SOLN: 6.2500 mg | INTRAMUSCULAR | Status: DC | PRN

## 2016-11-03 MED ORDER — LACTATED RINGERS IV SOLN: INTRAVENOUS | Status: DC

## 2016-11-03 MED ORDER — DEXTROSE 5 % IV SOLN
1.5000 g | INTRAVENOUS | Status: AC
  Administered 2016-11-03: 1.5 g via INTRAVENOUS
  Filled 2016-11-03: qty 1.5

## 2016-11-03 MED ORDER — PHENYLEPHRINE HCL 10 MG/ML IJ SOLN
INTRAVENOUS | Status: DC | PRN
  Administered 2016-11-03: 25 ug/min via INTRAVENOUS

## 2016-11-03 MED ORDER — SODIUM CHLORIDE 0.9 % IV SOLN
INTRAVENOUS | Status: DC | PRN
  Administered 2016-11-03: 17:00:00 1 mL

## 2016-11-03 MED ORDER — SUGAMMADEX SODIUM 200 MG/2ML IV SOLN
INTRAVENOUS | Status: AC
  Filled 2016-11-03: qty 2

## 2016-11-03 MED ORDER — THROMBIN 20000 UNITS EX SOLR
CUTANEOUS | Status: AC
  Filled 2016-11-03: qty 40000

## 2016-11-03 MED ORDER — FENTANYL CITRATE (PF) 100 MCG/2ML IJ SOLN: 25.0000 ug | INTRAMUSCULAR | Status: DC | PRN

## 2016-11-03 MED ORDER — PROTAMINE SULFATE 10 MG/ML IV SOLN
INTRAVENOUS | Status: DC | PRN
  Administered 2016-11-03: 70 mg via INTRAVENOUS
  Administered 2016-11-03: 30 mg via INTRAVENOUS

## 2016-11-03 MED ORDER — ROCURONIUM BROMIDE 100 MG/10ML IV SOLN
INTRAVENOUS | Status: DC | PRN
  Administered 2016-11-03 (×2): 30 mg via INTRAVENOUS

## 2016-11-03 MED ORDER — FENTANYL CITRATE (PF) 100 MCG/2ML IJ SOLN
INTRAMUSCULAR | Status: DC | PRN
  Administered 2016-11-03 (×2): 50 ug via INTRAVENOUS
  Administered 2016-11-03: 100 ug via INTRAVENOUS
  Administered 2016-11-03: 50 ug via INTRAVENOUS

## 2016-11-03 MED ORDER — PROMETHAZINE HCL 25 MG/ML IJ SOLN: 6.2500 mg | INTRAMUSCULAR | Status: DC | PRN

## 2016-11-03 MED ORDER — ROCURONIUM BROMIDE 10 MG/ML (PF) SYRINGE
PREFILLED_SYRINGE | INTRAVENOUS | Status: AC
  Filled 2016-11-03: qty 5

## 2016-11-03 MED ORDER — CEFAZOLIN SODIUM-DEXTROSE 2-3 GM-% IV SOLR
INTRAVENOUS | Status: DC | PRN
  Administered 2016-11-03: 2 g via INTRAVENOUS

## 2016-11-03 MED ORDER — PROPOFOL 10 MG/ML IV BOLUS
INTRAVENOUS | Status: DC | PRN
  Administered 2016-11-03: 120 mg via INTRAVENOUS

## 2016-11-03 MED ORDER — HEMOSTATIC AGENTS (NO CHARGE) OPTIME
TOPICAL | Status: DC | PRN
  Administered 2016-11-03: 1 via TOPICAL

## 2016-11-03 MED ORDER — SUGAMMADEX SODIUM 200 MG/2ML IV SOLN
INTRAVENOUS | Status: DC | PRN
  Administered 2016-11-03: 150 mg via INTRAVENOUS

## 2016-11-03 MED ORDER — HEPARIN SODIUM (PORCINE) 1000 UNIT/ML IJ SOLN
INTRAMUSCULAR | Status: AC
  Filled 2016-11-03: qty 3

## 2016-11-03 MED ORDER — HEMOSTATIC AGENTS (NO CHARGE) OPTIME
TOPICAL | Status: DC | PRN
  Administered 2016-11-03 (×4): 1 via TOPICAL

## 2016-11-03 MED ORDER — ONDANSETRON HCL 4 MG/2ML IJ SOLN
INTRAMUSCULAR | Status: AC
  Filled 2016-11-03: qty 2

## 2016-11-03 MED ORDER — LIDOCAINE HCL (CARDIAC) 20 MG/ML IV SOLN
INTRAVENOUS | Status: DC | PRN
  Administered 2016-11-03: 50 mg via INTRAVENOUS

## 2016-11-03 MED ORDER — DEXAMETHASONE SODIUM PHOSPHATE 10 MG/ML IJ SOLN
INTRAMUSCULAR | Status: AC
  Filled 2016-11-03: qty 1

## 2016-11-03 MED ORDER — DEXTROSE 5 % IV SOLN
INTRAVENOUS | Status: DC | PRN
  Administered 2016-11-03: 50 ug/min via INTRAVENOUS

## 2016-11-03 MED ORDER — EPHEDRINE SULFATE 50 MG/ML IJ SOLN
INTRAMUSCULAR | Status: DC | PRN
  Administered 2016-11-03: 10 mg via INTRAVENOUS
  Administered 2016-11-03: 5 mg via INTRAVENOUS

## 2016-11-03 MED ORDER — FENTANYL CITRATE (PF) 100 MCG/2ML IJ SOLN
INTRAMUSCULAR | Status: DC | PRN
  Administered 2016-11-03: 100 ug via INTRAVENOUS

## 2016-11-03 MED ORDER — PHENYLEPHRINE HCL 10 MG/ML IJ SOLN
INTRAMUSCULAR | Status: DC | PRN
  Administered 2016-11-03 (×2): 40 ug via INTRAVENOUS

## 2016-11-03 SURGICAL SUPPLY — 51 items
ADH SKN CLS APL DERMABOND .7 (GAUZE/BANDAGES/DRESSINGS) ×1
ADH SKN CLS LQ APL DERMABOND (GAUZE/BANDAGES/DRESSINGS) ×1
AGENT HMST SPONGE THK3/8 (HEMOSTASIS) ×3
CANISTER SUCT 3000ML PPV (MISCELLANEOUS) ×2 IMPLANT
CANNULA VESSEL 3MM 2 BLNT TIP (CANNULA) ×4 IMPLANT
CATH ROBINSON RED A/P 18FR (CATHETERS) ×3 IMPLANT
CLIP TI MEDIUM 6 (CLIP) ×1 IMPLANT
CLIP TI WIDE RED SMALL 6 (CLIP) ×1 IMPLANT
CLIP VESOCCLUDE MED 6/CT (CLIP) ×2 IMPLANT
CLIP VESOCCLUDE SM WIDE 6/CT (CLIP) ×2 IMPLANT
CRADLE DONUT ADULT HEAD (MISCELLANEOUS) ×2 IMPLANT
DECANTER SPIKE VIAL GLASS SM (MISCELLANEOUS) IMPLANT
DERMABOND ADHESIVE PROPEN (GAUZE/BANDAGES/DRESSINGS) ×1
DERMABOND ADVANCED (GAUZE/BANDAGES/DRESSINGS) ×1
DERMABOND ADVANCED .7 DNX12 (GAUZE/BANDAGES/DRESSINGS) ×1 IMPLANT
DERMABOND ADVANCED .7 DNX6 (GAUZE/BANDAGES/DRESSINGS) IMPLANT
DRAIN HEMOVAC 1/8 X 5 (WOUND CARE) IMPLANT
ELECT REM PT RETURN 9FT ADLT (ELECTROSURGICAL) ×2
ELECTRODE REM PT RTRN 9FT ADLT (ELECTROSURGICAL) ×1 IMPLANT
EVACUATOR SILICONE 100CC (DRAIN) ×1 IMPLANT
GAUZE SPONGE 4X4 12PLY STRL (GAUZE/BANDAGES/DRESSINGS) ×1 IMPLANT
GAUZE SPONGE 4X4 16PLY XRAY LF (GAUZE/BANDAGES/DRESSINGS) ×3 IMPLANT
GLOVE BIO SURGEON STRL SZ 6.5 (GLOVE) ×1 IMPLANT
GLOVE BIO SURGEON STRL SZ7.5 (GLOVE) ×2 IMPLANT
GOWN STRL REUS W/ TWL LRG LVL3 (GOWN DISPOSABLE) ×3 IMPLANT
GOWN STRL REUS W/TWL LRG LVL3 (GOWN DISPOSABLE) ×6
HEMOSTAT SNOW SURGICEL 2X4 (HEMOSTASIS) ×1 IMPLANT
HEMOSTAT SPONGE AVITENE ULTRA (HEMOSTASIS) ×3 IMPLANT
KIT BASIN OR (CUSTOM PROCEDURE TRAY) ×2 IMPLANT
KIT ROOM TURNOVER OR (KITS) ×2 IMPLANT
KIT SHUNT ARGYLE CAROTID ART 6 (VASCULAR PRODUCTS) IMPLANT
NDL HYPO 25GX1X1/2 BEV (NEEDLE) IMPLANT
NEEDLE HYPO 25GX1X1/2 BEV (NEEDLE) IMPLANT
NS IRRIG 1000ML POUR BTL (IV SOLUTION) ×4 IMPLANT
PACK CAROTID (CUSTOM PROCEDURE TRAY) ×2 IMPLANT
PAD ARMBOARD 7.5X6 YLW CONV (MISCELLANEOUS) ×4 IMPLANT
POWDER SURGICEL 3.0 GRAM (HEMOSTASIS) ×1 IMPLANT
SHUNT CAROTID BYPASS 10 (VASCULAR PRODUCTS) IMPLANT
SHUNT CAROTID BYPASS 12FRX15.5 (VASCULAR PRODUCTS) IMPLANT
SUT ETHILON 3 0 FSL (SUTURE) ×1 IMPLANT
SUT ETHILON 3 0 PS 1 (SUTURE) ×2 IMPLANT
SUT PROLENE 6 0 CC (SUTURE) ×10 IMPLANT
SUT SILK 3 0 TIES 17X18 (SUTURE)
SUT SILK 3-0 18XBRD TIE BLK (SUTURE) IMPLANT
SUT VIC AB 3-0 SH 27 (SUTURE) ×4
SUT VIC AB 3-0 SH 27X BRD (SUTURE) ×1 IMPLANT
SUT VICRYL 4-0 PS2 18IN ABS (SUTURE) ×2 IMPLANT
SYR CONTROL 10ML LL (SYRINGE) IMPLANT
TAPE CLOTH SURG 4X10 WHT LF (GAUZE/BANDAGES/DRESSINGS) ×1 IMPLANT
TRAY FOLEY W/METER SILVER 16FR (SET/KITS/TRAYS/PACK) ×1 IMPLANT
WATER STERILE IRR 1000ML POUR (IV SOLUTION) ×2 IMPLANT

## 2016-11-03 SURGICAL SUPPLY — 46 items
ADH SKN CLS APL DERMABOND .7 (GAUZE/BANDAGES/DRESSINGS) ×1
AGENT HMST SPONGE THK3/8 (HEMOSTASIS) ×1
CANISTER SUCT 3000ML PPV (MISCELLANEOUS) ×2 IMPLANT
CANNULA VESSEL 3MM 2 BLNT TIP (CANNULA) ×4 IMPLANT
CATH ROBINSON RED A/P 18FR (CATHETERS) ×2 IMPLANT
CLIP TI WIDE RED SMALL 6 (CLIP) ×1 IMPLANT
CLIP VESOCCLUDE MED 6/CT (CLIP) ×2 IMPLANT
CLIP VESOCCLUDE SM WIDE 6/CT (CLIP) ×2 IMPLANT
CRADLE DONUT ADULT HEAD (MISCELLANEOUS) ×2 IMPLANT
DECANTER SPIKE VIAL GLASS SM (MISCELLANEOUS) IMPLANT
DERMABOND ADVANCED (GAUZE/BANDAGES/DRESSINGS) ×1
DERMABOND ADVANCED .7 DNX12 (GAUZE/BANDAGES/DRESSINGS) ×1 IMPLANT
DRAIN HEMOVAC 1/8 X 5 (WOUND CARE) ×1 IMPLANT
ELECT REM PT RETURN 9FT ADLT (ELECTROSURGICAL) ×2
ELECTRODE REM PT RTRN 9FT ADLT (ELECTROSURGICAL) ×1 IMPLANT
EVACUATOR SILICONE 100CC (DRAIN) ×1 IMPLANT
GAUZE SPONGE 4X4 12PLY STRL (GAUZE/BANDAGES/DRESSINGS) ×1 IMPLANT
GAUZE SPONGE 4X4 16PLY XRAY LF (GAUZE/BANDAGES/DRESSINGS) ×1 IMPLANT
GLOVE BIO SURGEON STRL SZ 6 (GLOVE) ×1 IMPLANT
GLOVE BIO SURGEON STRL SZ7.5 (GLOVE) ×3 IMPLANT
GOWN STRL REUS W/ TWL LRG LVL3 (GOWN DISPOSABLE) ×3 IMPLANT
GOWN STRL REUS W/TWL LRG LVL3 (GOWN DISPOSABLE) ×6
HEMOSTAT SPONGE AVITENE ULTRA (HEMOSTASIS) ×1 IMPLANT
KIT BASIN OR (CUSTOM PROCEDURE TRAY) ×2 IMPLANT
KIT ROOM TURNOVER OR (KITS) ×2 IMPLANT
KIT SHUNT ARGYLE CAROTID ART 6 (VASCULAR PRODUCTS) IMPLANT
NDL HYPO 25GX1X1/2 BEV (NEEDLE) IMPLANT
NEEDLE HYPO 25GX1X1/2 BEV (NEEDLE) IMPLANT
NS IRRIG 1000ML POUR BTL (IV SOLUTION) ×4 IMPLANT
PACK CAROTID (CUSTOM PROCEDURE TRAY) ×2 IMPLANT
PAD ARMBOARD 7.5X6 YLW CONV (MISCELLANEOUS) ×4 IMPLANT
PATCH HEMASHIELD 8X75 (Vascular Products) ×1 IMPLANT
SHUNT CAROTID BYPASS 10 (VASCULAR PRODUCTS) ×1 IMPLANT
SHUNT CAROTID BYPASS 12FRX15.5 (VASCULAR PRODUCTS) IMPLANT
SUT ETHILON 3 0 FSL (SUTURE) ×1 IMPLANT
SUT ETHILON 3 0 PS 1 (SUTURE) IMPLANT
SUT PROLENE 6 0 CC (SUTURE) ×3 IMPLANT
SUT PROLENE 7 0 BV 1 (SUTURE) ×1 IMPLANT
SUT PROLENE 7 0 BV1 MDA (SUTURE) ×1 IMPLANT
SUT SILK 3 0 TIES 17X18 (SUTURE)
SUT SILK 3-0 18XBRD TIE BLK (SUTURE) IMPLANT
SUT VIC AB 3-0 SH 27 (SUTURE) ×2
SUT VIC AB 3-0 SH 27X BRD (SUTURE) ×1 IMPLANT
SUT VICRYL 4-0 PS2 18IN ABS (SUTURE) ×2 IMPLANT
SYR CONTROL 10ML LL (SYRINGE) IMPLANT
WATER STERILE IRR 1000ML POUR (IV SOLUTION) ×2 IMPLANT

## 2016-11-03 NOTE — Progress Notes (Signed)
PROGRESS NOTE    Regina James  OVF:[id number] DOB: 04-02-1934 DOA: 10/31/2016 PCP: Kathyrn Lass, MD    Brief Narrative:  83 y.o. female with a PMH of stroke diagnosed 06/2016 when she presented with right upper extremity weakness, found to have scattered left MCA infarcts. Workup at that time included CTA of the head and neck which showed left subclavian artery stenosis, and aortic arch aneurysm and large vessel vasculopathy. 2-D echo did not show any cardioembolic source. She was also found to have paroxysmal SVT. Cardiologist was consulted and planned an outpatient TEE and loop recorder. She was discharged on Lipitor, aspirin and Plavix. It was also recommended that she follow-up with vascular surgery to address her left subclavian arterial stenosis. She followed up as an outpatient with neurology on 08/10/16, but had not yet followed up with cardiology for the TEE or loop recorder. She did see Dr. Oneida Alar from vascular surgery 08/26/16, and it was felt that the benefits did not outweigh the risks to stent the left subclavian artery. To address the ascending aortic aneurysm, an abdominal ultrasound was scheduled. She returned to the hospital 10/31/16 for evaluation of the acute onset of right leg weakness and worsening right arm weakness. She admitted that she had not been compliant with taking her Lipitor or Plavix as prescribed. An MRI was obtained which showed scattered small acute infarcts in the left cerebral hemisphere.  Assessment & Plan:   Principal Problem:   Stroke [facility]) Active Problems:   Mixed dyslipidemia   Essential hypertension   Tachyarrhythmia   Internal carotid artery stenosis, left   Aortic arch aneurysm (HCC)  Principal Problem:   Embolic stroke [facility]) - Neurohospitalist consulted. CT angiogram of the head and neck was negative for large vessel occlusion but showed mild progression of bulky soft plaque at the left ICA [name] with 60% proximal left ICA stenosis. There is also  disease of the left MCA and left greater than right PCA branches. Patient was seen by vascular surgeon with plans to proceed with carotid endarterectomy. TEE/loop recorder done.. Results from TEE are pending. PT is recommending home health PT with 24-hour supervision. OT evaluation pending. No speech therapy needed.  Continue ASA/Plavix/Lipitor. - Pt planned for CEA 8/1   Active Problems:   Mixed dyslipidemia - Cholesterol 167, triglycerides 206, LDL 95.  Continue Lipitor. - Stable at this time     Essential hypertension - Continue metoprolol.  - Continued with permissive hypertension allowed for the first 48 hours, now begin to slowly lower blood pressure.    Tachyarrhythmia  - Cardiology consulted.  - Continue telemetry. Loop recorder was placed.    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 - Stable at present    Left ICA stenosis - Pt to have CEA 8/1 - Stable at present  DVT prophylaxis: Lovenox subQ Code Status: Full Family Communication: Pt in room, family not at bedside Disposition Plan: Uncertain at this time  Consultants:   Neurology  Vascular Surgery  Procedures:   CEA 8/1  Antimicrobials: Anti-infectives    Start     Dose/Rate Route Frequency Ordered Stop   11/03/16 0400  ceFAZolin (ANCEF) IVPB 1 g/50 mL premix    Comments:  Send with pt to OR   1 g 100 mL/hr over 30 Minutes Intravenous On call 11/02/16 1328 11/04/16 0400   11/02/16 1111  cefUROXime (ZINACEF) 1.5 g in dextrose 5 % 50 mL IVPB     1.5 g 100 mL/hr over  30 Minutes Intravenous On call to O.R. 11/02/16 1110 11/03/16 0559       Subjective: Without complaints at present  Objective: Vitals:   11/02/16 2153 11/03/16 0029 11/03/16 0537 11/03/16 1443  BP: (!) 170/68 135/60 (!) 153/69   Pulse: (!) 58 60 63   Resp: 18 18 18    Temp: (!) 97.4 F (36.3 C) 98.3 F (36.8 C) 97.8 F (36.6 C)   TempSrc: Oral Oral Oral   SpO2: 99% 94% 97%     Weight:    73.5 kg (162 lb)  Height:    5' 6.6" (1.692 m)    Intake/Output Summary (Last 24 hours) at 11/03/16 1538 Last data filed at 11/02/16 2235  Gross per 24 hour  Intake              150 ml  Output                0 ml  Net              150 ml   Filed Weights   11/02/16 0839 11/03/16 1443  Weight: 73.5 kg (162 lb) 73.5 kg (162 lb)    Examination:  General exam: Appears calm and comfortable  Respiratory system: Clear to auscultation. Respiratory effort normal. Cardiovascular system: S1 & S2 heard, RRR Gastrointestinal system: Abdomen is nondistended, soft and nontender. No organomegaly or masses felt. Normal bowel sounds heard. Central nervous system: Alert and oriented. No focal neurological deficits. Extremities: Symmetric 5 x 5 power. Skin: No rashes, lesions Psychiatry: Judgement and insight appear normal. Mood & affect appropriate.   Data Reviewed: I have personally reviewed following labs and imaging studies  CBC:  Recent Labs Lab 10/31/16 1055 10/31/16 1106 11/01/16 0341 11/03/16 0711  WBC 9.6  --  7.0 7.3  NEUTROABS 6.3  --  3.6  --   HGB 14.1 14.3 13.0 14.7  HCT 42.0 42.0 38.5 43.1  MCV 93.3  --  93.2 92.9  PLT 277  --  262 916   Basic Metabolic Panel:  Recent Labs Lab 10/31/16 1055 10/31/16 1106 11/01/16 0341 11/02/16 0658 11/03/16 0711  NA 143 143 139 143 141  K 3.9 4.0 3.6 3.9 4.1  CL 110 108 109 110 109  CO2 25  --  23 24 22   GLUCOSE 98 97 103* 108* 94  BUN 8 9 8 9 14   CREATININE 0.75 0.70 0.68 0.77 0.77  CALCIUM 9.1  --  8.5* 8.9 8.7*   GFR: Estimated Creatinine Clearance: 57.3 mL/min (by C-G formula based on SCr of 0.77 mg/dL). Liver Function Tests:  Recent Labs Lab 10/31/16 1055  AST 37  ALT 38  ALKPHOS 106  BILITOT 0.8  PROT 7.0  ALBUMIN 3.5   No results for input(s): LIPASE, AMYLASE in the last 168 hours. No results for input(s): AMMONIA in the last 168 hours. Coagulation Profile:  Recent Labs Lab 10/31/16 1055  11/02/16 0658  INR 1.10 1.06   Cardiac Enzymes:  Recent Labs Lab 10/31/16 1055  CKTOTAL 208   BNP (last 3 results) No results for input(s): PROBNP in the last 8760 hours. HbA1C:  Recent Labs  11/01/16 0341  HGBA1C 5.7*   CBG: No results for input(s): GLUCAP in the last 168 hours. Lipid Profile:  Recent Labs  11/01/16 0341  CHOL 147  HDL 42  LDLCALC 89  TRIG 82  CHOLHDL 3.5   Thyroid Function Tests: No results for input(s): TSH, T4TOTAL, FREET4, T3FREE, THYROIDAB in the last 72  hours. Anemia Panel: No results for input(s): VITAMINB12, FOLATE, FERRITIN, TIBC, IRON, RETICCTPCT in the last 72 hours. Sepsis Labs: No results for input(s): PROCALCITON, LATICACIDVEN in the last 168 hours.  Recent Results (from the past 240 hour(s))  Surgical pcr screen     Status: None   Collection Time: 11/03/16  5:13 AM  Result Value Ref Range Status   MRSA, PCR NEGATIVE NEGATIVE Final   Staphylococcus aureus NEGATIVE NEGATIVE Final    Comment:        The Xpert SA Assay (FDA approved for NASAL specimens in patients over 51 years of age), is one component of a comprehensive surveillance program.  Test performance has been validated by [facility] for patients greater than or equal to 45 year old. It is not intended to diagnose infection nor to guide or monitor treatment.      Radiology Studies: No results found.  Scheduled Meds: . [MAR Hold] aspirin EC  325 mg Oral Daily  . [MAR Hold] clopidogrel  75 mg Oral Daily  . [MAR Hold] enoxaparin (LOVENOX) injection  40 mg Subcutaneous Q24H  . [MAR Hold] metoprolol succinate  50 mg Oral Daily  . [MAR Hold] multivitamin with minerals  1 tablet Oral Daily  . [MAR Hold] nicotine  14 mg Transdermal Daily  . [MAR Hold] pravastatin  20 mg Oral q1800   Continuous Infusions: .  ceFAZolin (ANCEF) IV    . lactated ringers 10 mL/hr at 11/03/16 1445     LOS: 3 days   Kjuan Seipp, Orpah Melter, MD [facility] Pager  [phone]  If 7PM-7AM, please contact night-coverage [email or web address] Password [facility] 11/03/2016, 3:38 PM

## 2016-11-03 NOTE — Progress Notes (Signed)
Called by Dr. Oneida Alar due to increased right sided weakness following carotid surgery. She was taken back for emergent hematoma evacuation/exploration to the OR. Following this, she was taken for stat CT. The patient was still intubated and sedated. CTA without hemorrhage or large vessel occlusion. She is not a tpa candidate due to surgery. Will plan for MRI to further evaluate for recurrent stroke.   Roland Rack, MD [facility] [phone]  If 7pm- 7am, please page neurology on call as listed in [place].

## 2016-11-03 NOTE — Progress Notes (Signed)
STROKE TEAM PROGRESS NOTE   SUBJECTIVE (INTERVAL HISTORY) Her RN is with her in the hallway, she was sitting in wheelchair. She has been doing well, waiting for Dr. Oneida Alar performing left CEA today.    OBJECTIVE Temp:  [97.4 F (36.3 C)-98.7 F (37.1 C)] 97.8 F (36.6 C) (08/01 0537) Pulse Rate:  [58-75] 63 (08/01 0537) Cardiac Rhythm: Normal sinus rhythm (08/01 0900) Resp:  [16-18] 18 (08/01 0537) BP: (135-170)/(60-90) 153/69 (08/01 0537) SpO2:  [94 %-99 %] 97 % (08/01 0537) Weight:  [73.5 kg (162 lb)] 73.5 kg (162 lb) (08/01 1443)  CBC:   Recent Labs Lab 10/31/16 1055  11/01/16 0341 11/03/16 0711  WBC 9.6  --  7.0 7.3  NEUTROABS 6.3  --  3.6  --   HGB 14.1  < > 13.0 14.7  HCT 42.0  < > 38.5 43.1  MCV 93.3  --  93.2 92.9  PLT 277  --  262 241  < > = values in this interval not displayed.  Basic Metabolic Panel:   Recent Labs Lab 11/02/16 0658 11/03/16 0711  NA 143 141  K 3.9 4.1  CL 110 109  CO2 24 22  GLUCOSE 108* 94  BUN 9 14  CREATININE 0.77 0.77  CALCIUM 8.9 8.7*    Lipid Panel:     Component Value Date/Time   CHOL 147 11/01/2016 0341   TRIG 82 11/01/2016 0341   HDL 42 11/01/2016 0341   CHOLHDL 3.5 11/01/2016 0341   VLDL 16 11/01/2016 0341   LDLCALC 89 11/01/2016 0341   HgbA1c:  Lab Results  Component Value Date   HGBA1C 5.7 (H) 11/01/2016   Urine Drug Screen:     Component Value Date/Time   LABOPIA NONE DETECTED 10/31/2016 1541   COCAINSCRNUR NONE DETECTED 10/31/2016 1541   LABBENZ NONE DETECTED 10/31/2016 1541   AMPHETMU NONE DETECTED 10/31/2016 1541   THCU NONE DETECTED 10/31/2016 1541   LABBARB NONE DETECTED 10/31/2016 1541    Alcohol Level     Component Value Date/Time   ETH <5 10/31/2016 1055    IMAGING I have personally reviewed the radiological images below and agree with the radiology interpretations.  Ct Head Wo Contrast 10/31/2016 IMPRESSION: 1. Negative for bleed or other acute process. 2. Patchy left frontal parietal  white matter hypointensities more conspicuous than on prior study.  Ct Angio Head W Or Wo Contrast Ct Angio Neck W Or Wo Contrast 11/01/2016 IMPRESSION: 1. Penetrating Aortic Ulcer (PAU) with progression since March, now measuring up to 14 mm. See series 9, image 171 and series 12, image 127. 2. Negative for large vessel occlusion. Mild progression of bulky soft plaque at the left ICA origin since March, with 60% proximal Left ICA stenosis. No other large vessel stenosis. 3. Mild to moderate atherosclerotic irregularity of the left MCA and left greater than right PCA branches. 4. Continued stable non contrast CT appearance of the brain.    Mr Brain Limited Wo Contrast 10/31/2016 IMPRESSION: 1. Scattered small acute infarcts in the left cerebral hemisphere, greatest in the posterior frontal lobe near the vertex. 2. Mild chronic small vessel ischemic disease.  2D Echo 06/28/2016 Study Conclusions - Left ventricle: The cavity size was normal. There was mild concentric hypertrophy. Systolic function was normal. The estimated ejection fraction was in the range of 55% to 60%. Wall motion was normal; there were no regional wall motion abnormalities. Doppler parameters are consistent with abnormal left ventricular relaxation (grade 1 diastolic dysfunction).  Doppler parameters  are consistent with high ventricular filling pressure. - Aortic valve: Transvalvular velocity was within the normal range.  There was no stenosis. There was no regurgitation. - Mitral valve: Transvalvular velocity was within the normal range.  There was no evidence for stenosis. There was no regurgitation. - Left atrium: The atrium was moderately dilated. - Right ventricle: The cavity size was normal. Wall thickness was normal. Systolic function was normal. - Atrial septum: No defect or patent foramen ovale was identified. - Tricuspid valve: There was mild regurgitation. - Pulmonary arteries: Systolic pressure was within the  normal   range. PA peak pressure: 30 mm Hg (S).  VAS [place] LE 06/28/2016 Summary: - No evidence of deep vein or superficial thrombosis involving the right lower extremity and left lower extremity.  TEE Normal LV function; no LAA thrombus; atrial septal aneurysm; negative saline microcavitation study.   PHYSICAL EXAM  Temp:  [97.4 F (36.3 C)-98.7 F (37.1 C)] 97.8 F (36.6 C) (08/01 0537) Pulse Rate:  [58-75] 63 (08/01 0537) Resp:  [16-18] 18 (08/01 0537) BP: (135-170)/(60-90) 153/69 (08/01 0537) SpO2:  [94 %-99 %] 97 % (08/01 0537) Weight:  [73.5 kg (162 lb)] 73.5 kg (162 lb) (08/01 1443)  General - Well nourished, well developed, in no apparent distress.  Ophthalmologic - Sharp disc margins OU.   Cardiovascular - Regular rate and rhythm.  Mental Status -  Level of arousal and orientation to time, place, and person were intact. Language including expression, naming, repetition, comprehension was assessed and found intact. Attention span and concentration were normal. Fund of Knowledge was assessed and was intact.  Cranial Nerves II - XII - II - Visual field intact OU. III, IV, VI - Extraocular movements intact. V - Facial sensation intact bilaterally. VII - Facial movement intact bilaterally. VIII - Hearing & vestibular intact bilaterally. X - Palate elevates symmetrically. XI - Chin turning & shoulder shrug intact bilaterally. XII - Tongue protrusion intact.  Motor Strength - The patient's strength was normal in all extremities except right hand dexterity difficulty and and pronator drift was present, as well as right foot DF 4+/5.  Bulk was normal and fasciculations were absent.   Motor Tone - Muscle tone was assessed at the neck and appendages and was normal.  Reflexes - The patient's reflexes were 1+ in all extremities and she had no pathological reflexes.  Sensory - Light touch, temperature/pinprick were assessed and were symmetrical.    Coordination - The  patient had normal movements in the hands with no ataxia or dysmetria.  Tremor was absent.  Gait and Station - deferred.   ASSESSMENT/PLAN [name] is a 83 y.o. female with history of cancer, history PSVT, palpitations, tobacco abuse, CVA in March, 2018 with residual proximal RUE weakness, HTN, who presented with right sided weakness. She did not receive IV t-PA due to arriving outside of the treatment window/presenting without cortical signs.  Stroke:  Scattered small acute infarcts in the left MCA and ACA, likely embolic, due to left ICA soft plaque with stenosis vs. cardioembolic with PSVT and suspicious for afib.  Resultant  Right hand and right foot mild weakness  CT head: Patchy left frontal parietal white matter hypointensities more conspicuous than on prior study.  MRI head: Scattered small acute infarcts in the left cerebral hemisphere, greatest in the posterior frontal lobe near the vertex.   CTA: increased left proximal ICA soft plaque and stenosis now at 60%. No LVO or high-grade stenosis.  14 mm penetrating  aortic ulcer.  2D Echo (06/28/2016): EF 60-65%. No source of embolus.   TEE unremarkable   Loop recorder placed  LDL 89  HgbA1c 5.7  SCDs for VTE prophylaxis Diet NPO time specified Except for: Sips with Meds  clopidogrel 75 mg daily prior to admission, now on aspirin 325 mg daily and clopidogrel 75 mg daily. Continue DAPT for 3 months and then plavix alone.  Patient counseled to be compliant with her antithrombotic medications  Ongoing aggressive stroke risk factor management  Therapy recommendations:  pending  Disposition:  Pending  Left ICA stenosis with soft plaque  Could be the reason for recurrent left MCA / ACA infarcts  vascular surgery plans for left CEA in pm  Continue DAPT  Aortic ulceration   Distal to the left CCA take off - unlikely the cause of her stroke  Recommend vascular surgery or CVST consultation for further  monitoring and management  Hx of stroke  06/2016 right hand weakness - CTA head negative - CTA head left ICA 40-50% stenosis, aortic ulceration - MRI scattered left MCA infarcts - LDL 95 and A1C 5.6 - changed ASA to plavix and add lipitor - recommend outpt TEE and loop  08/2016 followed up in [place] with Dr. Leta Baptist - arranged TEE and loop but pt did not answer the arrangement with cardiology  Pt also noncompliant with plavix and lipitor  Hypertension  Unstable Permissive hypertension (OK if < 220/120) but gradually normalize in 5-7 days Long-term BP goal normotensive  Hyperlipidemia  Home meds: atorvastatin 20mg , not taking at home  LDL 89, goal < 70  Will change to pravastatin 20mg  for compliance  Continue statin at discharge   Tobacco abuse  Current smoker  Smoking cessation counseling provided  Nicotine patch provided  Pt is willing to quit  Other Stroke Risk Factors  Advanced age  ETOH use, advised to drink no more than 1 drink(s) a day  Other Active Problems  None  Hospital day # 3  [name], MD PhD Stroke Neurology 11/03/2016 3:43 PM   To contact Stroke Continuity provider, please refer to [email or web address] After hours, contact General Neurology

## 2016-11-03 NOTE — Anesthesia Preprocedure Evaluation (Addendum)
Anesthesia Evaluation  Patient identified by MRN, date of birth, ID band Patient awake    Reviewed: Allergy & Precautions, NPO status , Patient's Chart, lab work & pertinent test results  Airway Mallampati: II  TM Distance: >3 FB     Dental  (+) Teeth Intact, Dental Advisory Given   Pulmonary former smoker,    breath sounds clear to auscultation       Cardiovascular hypertension, + Peripheral Vascular Disease   Rhythm:Regular Rate:Normal     Neuro/Psych CVA    GI/Hepatic negative GI ROS, Neg liver ROS,   Endo/Other  negative endocrine ROS  Renal/GU negative Renal ROS     Musculoskeletal negative musculoskeletal ROS (+)   Abdominal   Peds  Hematology negative hematology ROS (+)   Anesthesia Other Findings   Reproductive/Obstetrics negative OB ROS                            Lab Results  Component Value Date   WBC 7.3 11/03/2016   HGB 14.7 11/03/2016   HCT 43.1 11/03/2016   MCV 92.9 11/03/2016   PLT 241 11/03/2016   Lab Results  Component Value Date   CREATININE 0.77 11/03/2016   BUN 14 11/03/2016   NA 141 11/03/2016   K 4.1 11/03/2016   CL 109 11/03/2016   CO2 22 11/03/2016   Lab Results  Component Value Date   INR 1.06 11/02/2016   INR 1.10 10/31/2016   INR 1.04 06/27/2016   Echo: Left ventricle: Hypertrophy was noted. Systolic function was   normal. The estimated ejection fraction was in the range of 55%   to 60%. Wall motion was normal; there were no regional wall   motion abnormalities. - Aortic valve: No evidence of vegetation. There was trivial   regurgitation. - Aortic root: The aortic root was mildly dilated. - Mitral valve: No evidence of vegetation. There was mild to   moderate regurgitation. - Left atrium: The atrium was mildly dilated. No evidence of   thrombus in the atrial cavity or appendage. - Right atrium: No evidence of thrombus in the atrial cavity  or   appendage. - Atrial septum: No defect or patent foramen ovale was identified.   There was an atrial septal aneurysm. - Tricuspid valve: No evidence of vegetation. - Pulmonic valve: No evidence of vegetation.  Anesthesia Physical Anesthesia Plan  ASA: III  Anesthesia Plan: General   Post-op Pain Management:    Induction: Intravenous  PONV Risk Score and Plan: 4 or greater and Ondansetron, Dexamethasone, Propofol infusion and Treatment may vary due to age or medical condition  Airway Management Planned: Oral ETT  Additional Equipment: Arterial line  Intra-op Plan:   Post-operative Plan: Extubation in OR and Possible Post-op intubation/ventilation  Informed Consent: I have reviewed the patients History and Physical, chart, labs and discussed the procedure including the risks, benefits and alternatives for the proposed anesthesia with the patient or authorized representative who has indicated his/her understanding and acceptance.   Dental advisory given  Plan Discussed with: CRNA  Anesthesia Plan Comments:         Anesthesia Quick Evaluation

## 2016-11-03 NOTE — Transfer of Care (Signed)
Immediate Anesthesia Transfer of Care Note  Patient: Regina James  Procedure(s) Performed: Procedure(s): ENDARTERECTOMY CAROTID (Left)  Patient Location: PACU  Anesthesia Type:General  Level of Consciousness: awake, alert  and oriented  Airway & Oxygen Therapy: Patient connected to nasal cannula oxygen  Post-op Assessment: Report given to RN, Post -op Vital signs reviewed and stable, Patient moving all extremities X 4 and Left side > Right side  Post vital signs: Reviewed and stable  Last Vitals:  Vitals:   11/03/16 0029 11/03/16 0537  BP: 135/60 (!) 153/69  Pulse: 60 63  Resp: 18 18  Temp: 36.8 C 36.6 C    Last Pain:  Vitals:   11/03/16 0800  TempSrc:   PainSc: 0-No pain      Patients Stated Pain Goal: 3 (70/78/67 5449)  Complications: No apparent anesthesia complications

## 2016-11-03 NOTE — Progress Notes (Addendum)
On arrival in PACU.  Pt not "feeling right".  She has 3/5 strength right hand arm and leg.  She also has a hematoma in the left neck.  Will return to OR for exploration. Pt family updated.  Ruta Hinds, MD [facility] Office: [phone] Pager: [phone]

## 2016-11-03 NOTE — Op Note (Signed)
Procedure: Evacuation hematoma left carotid, exploration left carotid artery  Preoperative diagnosis: #1 hematoma left neck #2 postop stroke  Postoperative diagnosis: Same  Anesthesia: Gen.  Operative findings: #1 no evidence of thrombus dissection or embolic debris within the carotid #2 diffuse needle hole bleeding most likely secondary to Plavix  Operative details: The patient was taken emergently back to the operating room. She was placed in supine position operating table. After induction of general anesthesia and endotracheal intubation, the patient's entire left neck and chest were prepped and draped in usual sterile fashion. Next a pre-existing Jackson-Pratt drain was removed. The left neck incision was reopened. A 5 x 5 cm hematoma was evacuated completely. There was some diffuse oozing from needle holes. The internal carotid and external carotids. Thyroid arteries were all dissected free circumferentially and vessel loops placed around these. Umbilical tape was placed around the common carotid artery. The patient was given 7000 units of intravenous heparin. The distal internal carotid artery was controlled with a small bulldog clamp. This. Thyroid and external carotid arteries controlled with vessel loops. Peripheral DeBakey clamp was used to control the common carotid artery. The patch was then opened on its medial aspect. Upon opening the carotid artery there was no thrombus within the artery. The distal extent of the endarterectomy was inspected and found to have no elevated flaps or evidence of dissection. Proximal common carotid artery was also widely patent. This point everything was irrigated with heparinized saline. The patch was then repaired using a running 6-0 Prolene suture. Flow was then restored first retrograde from the external carotid artery in an antegrade from the common carotid artery and finally to the internal carotid artery. There was still diffuse needle hole bleeding most  likely secondary to the patient's Plavix. Several hemostatic agents were placed on the patch. These included Avitene thrombin powder thrombin and Gelfoam and some material. Finally after about 45 minutes of direct pressure all needle hole bleeding seemed to cease. A new 10 mm Jackson-Pratt drain was brought out through separate stab incision at the base of the neck and secured to the skin with nylon stitch. Sternocleidomastoid was then closed over the drain and carotid artery using a running 3-0 Vicryl suture. The platysma was then reapproximated using a running 3-0 Vicryl suture. The skin was closed with a 4 Vicryl subcuticular stitch. Dermabond was applied the incision. Patient was taken intubated on a ventilator to CT for an emergent head CT scan. Instrument sponge and needle count was correct in the case. Patient was taken to the CT scanner in stable condition.  Ruta Hinds, MD [facility] Office: [phone] Pager: [phone]

## 2016-11-03 NOTE — Progress Notes (Signed)
Patient transported from CT to 5T-27 without complication.

## 2016-11-03 NOTE — Progress Notes (Signed)
OT Cancellation Note  Patient Details Name: Regina James MRN: [id number] DOB: [birth year]   Cancelled Treatment:    Reason Eval/Treat Not Completed: Patient at procedure or test/ unavailable. Pt in surgery currently for LCEA. OT will continue to follow.  [place] 11/03/2016, 2:44 PM  Hulda Humphrey OTR/L [phone]

## 2016-11-03 NOTE — Anesthesia Preprocedure Evaluation (Signed)
Anesthesia Evaluation  Patient identified by MRN, date of birth, ID band Patient awake    Reviewed: Allergy & Precautions, H&P , NPO status , Patient's Chart, lab work & pertinent test resultsPreop documentation limited or incomplete due to emergent nature of procedure.  Airway Mallampati: II   Neck ROM: full    Dental   Pulmonary former smoker,    breath sounds clear to auscultation       Cardiovascular hypertension, + Peripheral Vascular Disease   Rhythm:regular Rate:Normal     Neuro/Psych CVA    GI/Hepatic   Endo/Other    Renal/GU      Musculoskeletal   Abdominal   Peds  Hematology   Anesthesia Other Findings   Reproductive/Obstetrics                             Anesthesia Physical Anesthesia Plan  ASA: III and emergent  Anesthesia Plan: General   Post-op Pain Management:    Induction: Intravenous  PONV Risk Score and Plan: 4 or greater and Ondansetron, Dexamethasone, Midazolam, Propofol infusion and Treatment may vary due to age or medical condition  Airway Management Planned: Oral ETT  Additional Equipment: Arterial line  Intra-op Plan:   Post-operative Plan: Extubation in OR  Informed Consent:   Plan Discussed with: CRNA, Anesthesiologist and Surgeon  Anesthesia Plan Comments: (Emergency case.  Bring back for post-op neck hematoma.)        Anesthesia Quick Evaluation

## 2016-11-03 NOTE — Transfer of Care (Signed)
Immediate Anesthesia Transfer of Care Note  Patient: Regina James  Procedure(s) Performed: Procedure(s): Exploration of Carotid  ENDARTERECTOMY. (Left)  Patient Location: SICU  Anesthesia Type:General  Level of Consciousness: Patient remains intubated per anesthesia plan  Airway & Oxygen Therapy: Patient placed on Ventilator (see vital sign flow sheet for setting)  Post-op Assessment: Report given to RN and Post -op Vital signs reviewed and stable  Post vital signs: Reviewed and stable  Last Vitals:  Vitals:   11/03/16 2340 11/03/16 2354  BP:    Pulse:    Resp: 16   Temp:  36.4 C    Last Pain:  Vitals:   11/03/16 2354  TempSrc: Oral  PainSc:       Patients Stated Pain Goal: 3 (94/80/16 5537)  Complications: No apparent anesthesia complications

## 2016-11-03 NOTE — Progress Notes (Signed)
PT Cancellation Note  Patient Details Name: KRISTALYN BERGSTRESSER MRN: [id number] DOB: [birth year]   Cancelled Treatment:    Reason Eval/Treat Not Completed: Patient at procedure or test/unavailable; patient down for CEA.  Will attempt to see tomorrow.   Reginia Naas 11/03/2016, 3:50 PM  Magda Kiel, [place] 11/03/2016

## 2016-11-03 NOTE — Anesthesia Postprocedure Evaluation (Signed)
Anesthesia Post Note  Patient: Regina James  Procedure(s) Performed: Procedure(s) (LRB): ENDARTERECTOMY CAROTID (Left)     Patient location during evaluation: PACU Anesthesia Type: General Level of consciousness: awake and alert and patient cooperative Pain management: pain level controlled Vital Signs Assessment: post-procedure vital signs reviewed and stable Respiratory status: spontaneous breathing and respiratory function stable Cardiovascular status: stable Anesthetic complications: no Comments: Pt has swelling at left neck incision site.  Bleeding post-op.  Plan to bring back to OR urgently.    Last Vitals:  Vitals:   11/03/16 1945 11/03/16 2000  BP: 138/65 124/64  Pulse: (!) 37 72  Resp: (!) 9 15  Temp: 36.9 C     Last Pain:  Vitals:   11/03/16 0800  TempSrc:   PainSc: 0-No pain                 Almalik Weissberg S

## 2016-11-03 NOTE — Anesthesia Procedure Notes (Signed)
Procedure Name: Intubation Date/Time: 11/03/2016 8:20 PM Performed by: Valetta Fuller Pre-anesthesia Checklist: Patient identified, Emergency Drugs available, Suction available and Patient being monitored Patient Re-evaluated:Patient Re-evaluated prior to induction Oxygen Delivery Method: Circle system utilized Preoxygenation: Pre-oxygenation with 100% oxygen Induction Type: IV induction and Rapid sequence Laryngoscope Size: Miller and 2 Grade View: Grade II Tube type: Subglottic suction tube Tube size: 7.5 mm Number of attempts: 1 Airway Equipment and Method: Stylet Placement Confirmation: ETT inserted through vocal cords under direct vision,  positive ETCO2 and breath sounds checked- equal and bilateral Secured at: 21 cm Tube secured with: Tape Dental Injury: Teeth and Oropharynx as per pre-operative assessment

## 2016-11-03 NOTE — Anesthesia Procedure Notes (Signed)
Procedure Name: Intubation Date/Time: 11/03/2016 5:02 PM Performed by: Candis Shine Pre-anesthesia Checklist: Patient identified, Emergency Drugs available, Suction available and Patient being monitored Patient Re-evaluated:Patient Re-evaluated prior to induction Oxygen Delivery Method: Circle System Utilized Preoxygenation: Pre-oxygenation with 100% oxygen Induction Type: IV induction Ventilation: Mask ventilation without difficulty Laryngoscope Size: Mac and 3 Grade View: Grade II Tube type: Oral Tube size: 7.5 mm Number of attempts: 1 Airway Equipment and Method: Stylet Placement Confirmation: ETT inserted through vocal cords under direct vision,  positive ETCO2 and breath sounds checked- equal and bilateral Secured at: 22 cm Tube secured with: Tape Dental Injury: Teeth and Oropharynx as per pre-operative assessment

## 2016-11-03 NOTE — Op Note (Signed)
Procedure: Left carotid endarterectomy  Preoperative diagnosis: Symptomatic left internal carotid artery stenosis  Postoperative diagnosis: Same  Anesthesia: General  Asst.: Leontine Locket, PAC  Operative findings: #1 greater than 70% left internal carotid stenosis ulcerated friable plaque                                #2 Dacron patch           #3 10 Fr shunt  Operative details: After obtaining informed consent, the patient was taken to the operating room. The patient was placed in a supine position on the operating room table. After induction of general anesthesia the patient's entire neck and chest was prepped and draped in the usual sterile fashion. An oblique incision was made on the left aspect of the patient's neck anterior to the border the left sternocleidomastoid muscle. The incision was carried into the subcutaneous tissues and through the platysma. The sternocleidomastoid muscle was identified and reflected laterally.  The common carotid artery was then found at the base of the incision this was dissected free circumferentially. It was fairly soft on palpation.  The vagus nerve was identified and protected. Dissection was then carried up to the level carotid bifurcation.   The hyperglossal nerve was well above the primary area of dissection. It was identified and protected.  The internal carotid artery was dissected free circumferentially just below the level of the hypoglossal nerve and it was soft in character at this location and above any palpable disease. A vessel loop was placed around this. Next the external carotid and superior thyroid arteries were dissected free circumferentially and vessel loops were placed around these. The patient was given 7000 units of intravenous heparin.  After 2 minutes of circulation time and raising the mean arterial pressure to 90 mm mercury, the distal internal carotid artery was controlled with small bulldog clamp. The external carotid and superior  thyroid arteries were controlled with vessel loops. The common carotid artery was controlled with a peripheral DeBakey clamp. A longitudinal opening was made in the common carotid artery just below the bifurcation. The arteriotomy was extended distally up into the internal carotid with Potts scissors. There was a large ulcerated friable plaque with greater than 70% stenosis in the internal carotid.  A 10 Fr shunt was brought onto the field and fashioned to fit the patient's artery.  This was threaded into the distal internal carotid artery and allowed to backbleed thoroughly.  There was pulsatile backbleeding.  This was then threaded into the common carotid and secured with a Rummel tourniquet.  There was no air at this point and flow was restored to the brain.  Attention was then turned to the common carotid artery once again. A suitable endarterectomy plane was obtained and endarterectomy was begun in the common carotid artery and a good proximal endpoint was obtained. An eversion endarterectomy was performed on the external carotid artery and a good endpoint was obtained. The plaque was then elevated in the internal carotid artery and a nice feathered distal endpoint was also obtained.  The plaque was passed off the table. All loose debris was then removed from the carotid bed and everything was thoroughly irrigated with heparinized saline. A Dacron patch was then brought on to the operative field and this was sewn on as a patch angioplasty using a running 6-0 Prolene suture. Just prior to completion of the patch, the shunt was occluded and removed.  The arteries were again controlled sequentially internal followed by common carotid.  Prior to completion of the anastomosis the internal carotid artery was thoroughly backbled. This was then controlled again with a fine bulldog clamp.  The common carotid was thoroughly flushed forward. The external carotid was also thoroughly backbled.  The remainder of the patch  was completed and the anastomosis was secured. Flow was then restored first retrograde from the external carotid into the carotid bed then antegrade from the common carotid to the external carotid artery and after approximately 5 cardiac cycles to the internal carotid artery. Doppler was used to evaluate the external/internal and common carotid arteries and these all had good Doppler flow. Hemostasis was obtained avitene and direct pressure. The patient was also given 80 mg of Protamine.  A 10 flat JP drain was brought out through a separate stab incision at the base of the neck and sutured to the skin with a 3 0 nylon suture.    The platysma muscle was reapproximated using a running 3-0 Vicryl suture. The skin was closed with 4 0 Vicryl subcuticular stitch.  The patient was awakened in the operating room and was moving upper and lower extremities symmetrically and following commands.  The patient was stable on arrival to the PACU.  Ruta Hinds, MD [facility] Office: [phone] Pager: [phone]

## 2016-11-04 ENCOUNTER — Inpatient Hospital Stay (HOSPITAL_COMMUNITY): Payer: Medicare Other

## 2016-11-04 ENCOUNTER — Encounter (HOSPITAL_COMMUNITY): Payer: Self-pay | Admitting: Vascular Surgery

## 2016-11-04 DIAGNOSIS — I63232 Cerebral infarction due to unspecified occlusion or stenosis of left carotid arteries: Secondary | ICD-10-CM

## 2016-11-04 DIAGNOSIS — J9600 Acute respiratory failure, unspecified whether with hypoxia or hypercapnia: Secondary | ICD-10-CM

## 2016-11-04 LAB — BASIC METABOLIC PANEL
Anion gap: 8 (ref 5–15)
BUN: 14 mg/dL (ref 6–20)
CO2: 23 mmol/L (ref 22–32)
Calcium: 8 mg/dL — ABNORMAL LOW (ref 8.9–10.3)
Chloride: 111 mmol/L (ref 101–111)
Creatinine, Ser: 0.79 mg/dL (ref 0.44–1.00)
GFR calc Af Amer: >60 mL/min (ref >60)
GFR calc non Af Amer: >60 mL/min (ref >60)
Glucose, Bld: 158 mg/dL — ABNORMAL HIGH (ref 65–99)
Potassium: 3.7 mmol/L (ref 3.5–5.1)
Sodium: 142 mmol/L (ref 135–145)

## 2016-11-04 LAB — GLUCOSE, CAPILLARY
Glucose-Capillary: 119 mg/dL — ABNORMAL HIGH (ref 65–99)
Glucose-Capillary: 129 mg/dL — ABNORMAL HIGH (ref 65–99)
Glucose-Capillary: 139 mg/dL — ABNORMAL HIGH (ref 65–99)
Glucose-Capillary: 87 mg/dL (ref 65–99)
Glucose-Capillary: 98 mg/dL (ref 65–99)

## 2016-11-04 LAB — BLOOD GAS, ARTERIAL
Acid-base deficit: 1.3 mmol/L (ref 0.0–2.0)
Bicarbonate: 22.5 mmol/L (ref 20.0–28.0)
FIO2: 100
MECHVT: 480 mL
O2 Saturation: 99.3 %
PEEP: 5 cmH2O
Patient temperature: 98.6
RATE: 16 resp/min
pCO2 arterial: 35.6 mmHg (ref 32.0–48.0)
pH, Arterial: 7.418 (ref 7.350–7.450)
pO2, Arterial: 231 mmHg — ABNORMAL HIGH (ref 83.0–108.0)

## 2016-11-04 LAB — CBC
HCT: 36 % (ref 36.0–46.0)
Hemoglobin: 12.1 g/dL (ref 12.0–15.0)
MCH: 31.2 pg (ref 26.0–34.0)
MCHC: 33.6 g/dL (ref 30.0–36.0)
MCV: 92.8 fL (ref 78.0–100.0)
Platelets: 321 10*3/uL (ref 150–400)
RBC: 3.88 MIL/uL (ref 3.87–5.11)
RDW: 14.8 % (ref 11.5–15.5)
WBC: 12.8 10*3/uL — ABNORMAL HIGH (ref 4.0–10.5)

## 2016-11-04 LAB — PHOSPHORUS: Phosphorus: 3.9 mg/dL (ref 2.5–4.6)

## 2016-11-04 LAB — MAGNESIUM: Magnesium: 1.5 mg/dL — ABNORMAL LOW (ref 1.7–2.4)

## 2016-11-04 LAB — TRIGLYCERIDES: Triglycerides: 71 mg/dL (ref <150)

## 2016-11-04 MED ORDER — PHENOL 1.4 % MT LIQD: 1.0000 | OROMUCOSAL | Status: DC | PRN

## 2016-11-04 MED ORDER — INSULIN ASPART 100 UNIT/ML ~~LOC~~ SOLN
0.0000 [IU] | SUBCUTANEOUS | Status: DC
  Administered 2016-11-04 (×2): 1 [IU] via SUBCUTANEOUS
  Administered 2016-11-06: 2 [IU] via SUBCUTANEOUS
  Administered 2016-11-07: 1 [IU] via SUBCUTANEOUS

## 2016-11-04 MED ORDER — PANTOPRAZOLE SODIUM 40 MG PO TBEC: 40.0000 mg | DELAYED_RELEASE_TABLET | Freq: Every day | ORAL | Status: DC

## 2016-11-04 MED ORDER — FENTANYL 2500MCG IN NS 250ML (10MCG/ML) PREMIX INFUSION
25.0000 ug/h | INTRAVENOUS | Status: DC
  Administered 2016-11-04: 300 ug/h via INTRAVENOUS
  Administered 2016-11-04: 50 ug/h via INTRAVENOUS
  Filled 2016-11-04 (×3): qty 250

## 2016-11-04 MED ORDER — SODIUM CHLORIDE 0.9 % IV SOLN
0.0000 ug/min | INTRAVENOUS | Status: DC
  Administered 2016-11-04: 25 ug/min via INTRAVENOUS
  Administered 2016-11-04: 100 ug/min via INTRAVENOUS
  Administered 2016-11-04: 50 ug/min via INTRAVENOUS
  Filled 2016-11-04 (×4): qty 1

## 2016-11-04 MED ORDER — PROPOFOL 1000 MG/100ML IV EMUL
5.0000 ug/kg/min | INTRAVENOUS | Status: DC
  Administered 2016-11-04: 60 ug/kg/min via INTRAVENOUS
  Filled 2016-11-04: qty 100

## 2016-11-04 MED ORDER — SODIUM CHLORIDE 0.9 % IV SOLN: 500.0000 mL | Freq: Once | INTRAVENOUS | Status: DC | PRN

## 2016-11-04 MED ORDER — CLOPIDOGREL BISULFATE 75 MG PO TABS
75.0000 mg | ORAL_TABLET | Freq: Every day | ORAL | Status: DC
  Filled 2016-11-04: qty 1

## 2016-11-04 MED ORDER — MAGNESIUM SULFATE 2 GM/50ML IV SOLN
2.0000 g | Freq: Every day | INTRAVENOUS | Status: DC | PRN
  Filled 2016-11-04: qty 50

## 2016-11-04 MED ORDER — FENTANYL CITRATE (PF) 100 MCG/2ML IJ SOLN: 50.0000 ug | Freq: Once | INTRAMUSCULAR | Status: DC

## 2016-11-04 MED ORDER — LABETALOL HCL 5 MG/ML IV SOLN: 10.0000 mg | INTRAVENOUS | Status: DC | PRN

## 2016-11-04 MED ORDER — ADULT MULTIVITAMIN W/MINERALS CH
1.0000 | ORAL_TABLET | Freq: Every day | ORAL | Status: DC
  Filled 2016-11-04: qty 1

## 2016-11-04 MED ORDER — POTASSIUM CHLORIDE CRYS ER 20 MEQ PO TBCR
20.0000 meq | EXTENDED_RELEASE_TABLET | Freq: Every day | ORAL | Status: DC | PRN
Start: 2016-11-04 — End: 2016-11-04

## 2016-11-04 MED ORDER — FENTANYL CITRATE (PF) 100 MCG/2ML IJ SOLN: 50.0000 ug | INTRAMUSCULAR | Status: DC | PRN

## 2016-11-04 MED ORDER — FENTANYL BOLUS VIA INFUSION
25.0000 ug | INTRAVENOUS | Status: DC | PRN
Start: 2016-11-04 — End: 2016-11-05
  Administered 2016-11-04: 25 ug via INTRAVENOUS
  Filled 2016-11-04: qty 25

## 2016-11-04 MED ORDER — PROPOFOL 1000 MG/100ML IV EMUL: 0.0000 ug/kg/min | INTRAVENOUS | Status: DC

## 2016-11-04 MED ORDER — HYDROMORPHONE HCL 1 MG/ML IJ SOLN: 0.5000 mg | INTRAMUSCULAR | Status: DC | PRN

## 2016-11-04 MED ORDER — DOCUSATE SODIUM 100 MG PO CAPS
100.0000 mg | ORAL_CAPSULE | Freq: Every day | ORAL | Status: DC
  Filled 2016-11-04: qty 1

## 2016-11-04 MED ORDER — CLOPIDOGREL BISULFATE 75 MG PO TABS
75.0000 mg | ORAL_TABLET | Freq: Once | ORAL | Status: AC
  Administered 2016-11-04: 75 mg

## 2016-11-04 MED ORDER — ASPIRIN 325 MG PO TABS
325.0000 mg | ORAL_TABLET | Freq: Every day | ORAL | Status: DC
  Administered 2016-11-04 – 2016-11-07 (×3): 325 mg
  Filled 2016-11-04 (×3): qty 1

## 2016-11-04 MED ORDER — ORAL CARE MOUTH RINSE
15.0000 mL | Freq: Four times a day (QID) | OROMUCOSAL | Status: DC
  Administered 2016-11-04 – 2016-11-06 (×7): 15 mL via OROMUCOSAL

## 2016-11-04 MED ORDER — DEXTROSE 5 % IV SOLN
1.5000 g | Freq: Two times a day (BID) | INTRAVENOUS | Status: AC
  Administered 2016-11-04 (×2): 1.5 g via INTRAVENOUS
  Filled 2016-11-04 (×3): qty 1.5

## 2016-11-04 MED ORDER — GUAIFENESIN-DM 100-10 MG/5ML PO SYRP: 15.0000 mL | ORAL_SOLUTION | ORAL | Status: DC | PRN

## 2016-11-04 MED ORDER — SODIUM CHLORIDE 0.9 % IV SOLN
0.0000 mg/h | INTRAVENOUS | Status: DC
  Administered 2016-11-04: 3 mg/h via INTRAVENOUS
  Filled 2016-11-04 (×3): qty 10

## 2016-11-04 MED ORDER — MIDAZOLAM HCL 2 MG/2ML IJ SOLN
1.0000 mg | INTRAMUSCULAR | Status: DC | PRN
  Administered 2016-11-04: 1 mg via INTRAVENOUS
  Filled 2016-11-04 (×2): qty 2

## 2016-11-04 MED ORDER — ONDANSETRON HCL 4 MG/2ML IJ SOLN
4.0000 mg | Freq: Four times a day (QID) | INTRAMUSCULAR | Status: DC | PRN
  Administered 2016-11-04: 4 mg via INTRAVENOUS
  Filled 2016-11-04: qty 2

## 2016-11-04 MED ORDER — FENTANYL CITRATE (PF) 100 MCG/2ML IJ SOLN
INTRAMUSCULAR | Status: AC
  Administered 2016-11-04: 50 ug
  Filled 2016-11-04: qty 2

## 2016-11-04 MED ORDER — MIDAZOLAM BOLUS VIA INFUSION
1.0000 mg | INTRAVENOUS | Status: DC | PRN
  Administered 2016-11-04 (×2): 2 mg via INTRAVENOUS
  Filled 2016-11-04: qty 2

## 2016-11-04 MED ORDER — ASPIRIN EC 325 MG PO TBEC: 325.0000 mg | DELAYED_RELEASE_TABLET | Freq: Every day | ORAL | Status: DC

## 2016-11-04 MED ORDER — METOPROLOL TARTRATE 5 MG/5ML IV SOLN: 2.0000 mg | INTRAVENOUS | Status: DC | PRN

## 2016-11-04 MED ORDER — CLOPIDOGREL BISULFATE 75 MG PO TABS
75.0000 mg | ORAL_TABLET | Freq: Every day | ORAL | Status: DC
  Administered 2016-11-05 – 2016-11-07 (×2): 75 mg
  Filled 2016-11-04 (×2): qty 1

## 2016-11-04 MED ORDER — CHLORHEXIDINE GLUCONATE 0.12% ORAL RINSE (MEDLINE KIT)
15.0000 mL | Freq: Two times a day (BID) | OROMUCOSAL | Status: DC
  Administered 2016-11-04 – 2016-11-06 (×5): 15 mL via OROMUCOSAL

## 2016-11-04 MED ORDER — METOPROLOL SUCCINATE ER 50 MG PO TB24: 50.0000 mg | ORAL_TABLET | Freq: Every day | ORAL | Status: DC

## 2016-11-04 MED ORDER — OXYCODONE-ACETAMINOPHEN 5-325 MG PO TABS: 1.0000 | ORAL_TABLET | Freq: Four times a day (QID) | ORAL | Status: DC | PRN

## 2016-11-04 MED ORDER — SODIUM CHLORIDE 0.9 % IV SOLN: 250.0000 mL | INTRAVENOUS | Status: DC | PRN

## 2016-11-04 MED ORDER — MIDAZOLAM HCL 2 MG/2ML IJ SOLN
1.0000 mg | INTRAMUSCULAR | Status: DC | PRN
  Administered 2016-11-04: 1 mg via INTRAVENOUS

## 2016-11-04 MED ORDER — ALUM & MAG HYDROXIDE-SIMETH 200-200-20 MG/5ML PO SUSP: 15.0000 mL | ORAL | Status: DC | PRN

## 2016-11-04 MED ORDER — PROPOFOL 1000 MG/100ML IV EMUL
0.0000 ug/kg/min | INTRAVENOUS | Status: DC
  Filled 2016-11-04: qty 100

## 2016-11-04 MED ORDER — SODIUM CHLORIDE 0.9 % IV SOLN
INTRAVENOUS | Status: DC
  Administered 2016-11-04 – 2016-11-05 (×3): via INTRAVENOUS

## 2016-11-04 MED ORDER — PANTOPRAZOLE SODIUM 40 MG IV SOLR
40.0000 mg | INTRAVENOUS | Status: DC
  Administered 2016-11-04 – 2016-11-05 (×2): 40 mg via INTRAVENOUS
  Filled 2016-11-04 (×2): qty 40

## 2016-11-04 NOTE — Progress Notes (Signed)
PT Cancellation Note  Patient Details Name: Regina James MRN: [id number] DOB: 06/17/1933   Cancelled Treatment:    Reason Eval/Treat Not Completed: Medical issues which prohibited therapy PT will continue to follow acutely.    Salina April, PTA Pager: [phone]   11/04/2016, 1:55 PM

## 2016-11-04 NOTE — Progress Notes (Signed)
Attempted to wean pt on SBT, pt w/ low VT 100 ml and also periods of apnea.  Increased PS up to 15, pt VT improved to 350-400 but still having periods of apnea even with frequent stimulation.  Pt placed back on full vent support at this time.  RN aware.

## 2016-11-04 NOTE — Consult Note (Signed)
PULMONARY / CRITICAL CARE MEDICINE   Name: Regina James MRN: [id number] DOB: 1933-06-06    ADMISSION DATE:  10/31/2016 CONSULTATION DATE:  11/04/2016  REFERRING MD:  Dr. 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 MRI with scattered small acute infarcts in the left cerebral hemisphere. 8/1 underwent CEA, found to have left carotid hematoma and right sided weakness so taken back to OR for evacuation, where a 5 x 5 cm hematoma was evacuated. Post-operatively Emergent head CT without hemorrhage or large vessel occlusion. Remained intubated. PCCM consulted.   PAST MEDICAL HISTORY :  She  has a past medical history of Cancer ([place]); History of PSVT (paroxysmal supraventricular tachycardia); Palpitations; and Stroke ([place]).  PAST SURGICAL HISTORY: She  has a past surgical history that includes US ECHOCARDIOGRAPHY (01/09/2009); Wrist fracture surgery; Appendectomy (1993); Cholecystectomy (1996); Rotator cuff repair (Right, 1990); LOOP RECORDER INSERTION (N/A, 11/02/2016); and TEE without cardioversion (N/A, 11/02/2016).  Allergies  Allergen Reactions  . Morphine And Related Swelling  . Levofloxacin Anxiety and Other (See Comments)    Double vision    No current facility-administered medications on file prior to encounter.    Current Outpatient Prescriptions on File Prior to Encounter  Medication Sig  . atorvastatin (LIPITOR) 20 MG tablet TAKE 1 TABLET AT 6PM. (Patient taking differently: Take 10 mg by mouth every 3 days)  . clopidogrel (PLAVIX) 75 MG tablet TAKE 1 TABLET ONCE DAILY. (Patient taking differently: Take 37.5 mg by mouth every 3 days)  . Doxylamine Succinate, Sleep, (SLEEP AID PO)  Take 1 tablet by mouth at bedtime.   . metoprolol succinate (TOPROL-XL) 100 MG 24 hr tablet Take  0.5 tablet ( 50 mg total) once daily  . Multiple Vitamin (MULTIVITAMIN) tablet Take 1 tablet by mouth daily.    Vladimir Faster Glycol-Propyl Glycol (SYSTANE OP) Place 1 drop into both eyes daily as needed (for dry eyes).     FAMILY HISTORY:  Her indicated that her mother is deceased. She indicated that her father is deceased. She indicated that both of her brothers are alive. She indicated that her maternal grandmother is deceased. She indicated that her maternal grandfather is deceased. She indicated that her paternal grandmother is deceased. She indicated that her paternal grandfather is deceased.    SOCIAL HISTORY: She  reports that she quit smoking about 4 months ago. Her smoking use included Cigarettes. She has a 11.25 pack-year smoking history. She has never used smokeless tobacco. She reports that she drinks about 4.2 oz of alcohol per week . She reports that she does not use drugs.  REVIEW OF SYSTEMS:   Unable to review as patient is encephalopathic   SUBJECTIVE:   VITAL SIGNS: BP (!) 188/100   Pulse 89   Temp 97.6 F (36.4 C) (Oral)   Resp 16   Ht 5\' 6"  (1.676 m)   Wt 74.4 kg (164 lb 0.4 oz)   SpO2 100%   BMI 26.47 kg/m   HEMODYNAMICS:    VENTILATOR SETTINGS: Vent Mode: PRVC FiO2 (%):  [100 %] 100 % Set Rate:  [16 bmp] 16 bmp Vt Set:  [480 mL] 480 mL PEEP:  [5 cmH20] 5 cmH20 Plateau Pressure:  [15 cmH20] 15  cmH20  INTAKE / OUTPUT: I/O last 3 completed shifts: In: 150 [P.O.:150] Out: -   PHYSICAL EXAMINATION: General:  Elderly female, no distress  Neuro:  Sedated, moves extremities, does not follow commands HEENT:  ETT in place, JP noted to neck  Cardiovascular:  RRR, no MRG  Lungs:  Clear breath sounds, non-labored, no wheeze  Abdomen:  Non-distended, active bowel sounds  Musculoskeletal:  -edema  Skin:  Warm, dry   LABS:  BMET  Recent Labs Lab  11/01/16 0341 11/02/16 0658 11/03/16 0711  NA 139 143 141  K 3.6 3.9 4.1  CL 109 110 109  CO2 23 24 22   BUN 8 9 14   CREATININE 0.68 0.77 0.77  GLUCOSE 103* 108* 94    Electrolytes  Recent Labs Lab 11/01/16 0341 11/02/16 0658 11/03/16 0711  CALCIUM 8.5* 8.9 8.7*    CBC  Recent Labs Lab 10/31/16 1055 10/31/16 1106 11/01/16 0341 11/03/16 0711  WBC 9.6  --  7.0 7.3  HGB 14.1 14.3 13.0 14.7  HCT 42.0 42.0 38.5 43.1  PLT 277  --  262 241    Coag's  Recent Labs Lab 10/31/16 1055 11/02/16 0658  APTT 39*  --   INR 1.10 1.06    Sepsis Markers No results for input(s): LATICACIDVEN, PROCALCITON, O2SATVEN in the last 168 hours.  ABG No results for input(s): PHART, PCO2ART, PO2ART in the last 168 hours.  Liver Enzymes  Recent Labs Lab 10/31/16 1055  AST 37  ALT 38  ALKPHOS 106  BILITOT 0.8  ALBUMIN 3.5    Cardiac Enzymes No results for input(s): TROPONINI, PROBNP in the last 168 hours.  Glucose No results for input(s): GLUCAP in the last 168 hours.  Imaging Ct Angio Head W Or Wo Contrast  Result Date: 11/04/2016 CLINICAL DATA:  Initial evaluation for acute right-sided weakness, recently postop from carotid endarterectomy. EXAM: CT ANGIOGRAPHY HEAD AND NECK TECHNIQUE: Multidetector CT imaging of the head and neck was performed using the standard protocol during bolus administration of intravenous contrast. Multiplanar CT image reconstructions and MIPs were obtained to evaluate the vascular anatomy. Carotid stenosis measurements (when applicable) are obtained utilizing NASCET criteria, using the distal internal carotid diameter as the denominator. CONTRAST:  50 cc of Isovue 370. COMPARISON:  Prior CT from 11/01/2016. FINDINGS: CT HEAD FINDINGS Brain: Age related cerebral atrophy with chronic small vessel ischemic disease. No acute intracranial hemorrhage. No evidence for acute large vessel territory infarct. No mass lesion, midline shift or mass effect. No  hydrocephalus. No extra-axial fluid collection. Vascular: No hyperdense vessel. Scattered vascular calcifications noted within the carotid siphons. Skull: Scalp soft tissues and calvarium within normal limits. Scattered mucosal no mastoid effusion. Orbits: Globes and orbital soft tissues within normal limits. Patient status post lens extraction bilaterally. CTA NECK FINDINGS Aortic arch: Penetrating atheromatous ulcer extending from the left lateral undersurface of the aortic arch again noted, measuring 15 mm on today's study, previously 14 mm. Visualized ascending aorta dilated up to 4.4 cm. No flow-limiting stenosis about the origin of the great vessels. Atheromatous irregularity within the right subclavian artery without stenosis. Severe stenosis involving the left subclavian artery of approximately 80% noted, stable (series 8, image 305). Right carotid system: Right common carotid artery patent from its origin to the bifurcation. Mild soft plaque about the right carotid bifurcation without flow-limiting stenosis. Right ICA patent to the skullbase without stenosis, dissection, or occlusion. Left carotid system: Left common carotid artery patent from its origin to the bifurcation. Postoperative changes  from recent left carotid endarterectomy mild irregularity about the endarterectomy site without flow-limiting stenosis. Left ICA widely patent distally to the skullbase without stenosis, dissection, or occlusion. Incidental note made of fairly high-grade severe stenosis at the proximal left external carotid artery (series 8, image 207). Vertebral arteries: Both of the vertebral arteries arise from the subclavian arteries. Left vertebral artery dominant. Vertebral arteries patent within the neck without stenosis, dissection, or occlusion. Skeleton: No acute osseus abnormality. No worrisome lytic or blastic osseous lesions. Moderate degenerate spondylolysis at C3-4 through C6-7. Other neck: Postoperative changes from  recent interval left carotid endarterectomy. Postoperative soft tissue swelling with emphysema within the left neck. JP drain remains in place. Patient is intubated with the endotracheal to in place. No complication. Upper chest: Visualized upper chest within normal limits. Atelectatic changes in within the partially visualized lungs. Partially visualized lungs are otherwise clear. Review of the MIP images confirms the above findings CTA HEAD FINDINGS Anterior circulation: Petrous segments patent bilaterally. Mild atheromatous plaque within the cavernous ICAs without flow-limiting stenosis. ICA termini patent. A1 segments patent bilaterally. Anterior communicating artery normal. Anterior cerebral arteries patent to their distal aspects. M1 segments patent without stenosis or occlusion. MCA bifurcations normal. No proximal M2 occlusion. Distal MCA branches well opacified and symmetric. Atheromatous irregularity involving the left greater than right MCA branches noted. Posterior circulation: Dominant left vertebral artery patent to the vertebrobasilar junction without stenosis. Hypoplastic right vertebral artery largely terminates in PICA. Posterior inferior cerebral arteries themselves patent proximally. Basilar artery somewhat diminutive but widely patent to its distal aspect. Superior cerebral arteries patent bilaterally. Both of the posterior cerebral arteries primarily supplied via the basilar and are well opacified to their distal aspects. Mild to moderate atheromatous irregularity involving the PCAs bilaterally, similar to previous. Venous sinuses: Not well evaluated on this exam due to arterial timing of the contrast bolus. Anatomic variants: No anatomic variant. No aneurysm or vascular malformation. Delayed phase: No pathologic enhancement. Review of the MIP images confirms the above findings IMPRESSION: 1. Negative CTA for emergent large vessel occlusion. 2. Postoperative changes from recent left carotid  endarterectomy without complication. No significant residual stenosis. 3. High-grade approximate 80% stenosis involving the mid left subclavian artery. 4. No other high-grade or critical flow limiting stenosis. 5. Penetrating aortic ulcer or, measuring 15 mm on today's exam, previously 14 mm on 11/01/2016. 6. Small vessel atheromatous irregularity involving the distal left MCA and left greater than right PCA branches bilaterally. Critical Value/emergent results were called by telephone at the time of interpretation on 11/03/2016 at 11:27 pm to Dr. Leonel Ramsay , who verbally acknowledged these results. Electronically Signed   By: Jeannine Boga M.D.   On: 11/04/2016 00:20   Ct Angio Neck W Or Wo Contrast  Result Date: 11/04/2016 CLINICAL DATA:  Initial evaluation for acute right-sided weakness, recently postop from carotid endarterectomy. EXAM: CT ANGIOGRAPHY HEAD AND NECK TECHNIQUE: Multidetector CT imaging of the head and neck was performed using the standard protocol during bolus administration of intravenous contrast. Multiplanar CT image reconstructions and MIPs were obtained to evaluate the vascular anatomy. Carotid stenosis measurements (when applicable) are obtained utilizing NASCET criteria, using the distal internal carotid diameter as the denominator. CONTRAST:  50 cc of Isovue 370. COMPARISON:  Prior CT from 11/01/2016. FINDINGS: CT HEAD FINDINGS Brain: Age related cerebral atrophy with chronic small vessel ischemic disease. No acute intracranial hemorrhage. No evidence for acute large vessel territory infarct. No mass lesion, midline shift or mass effect. No hydrocephalus. No  extra-axial fluid collection. Vascular: No hyperdense vessel. Scattered vascular calcifications noted within the carotid siphons. Skull: Scalp soft tissues and calvarium within normal limits. Scattered mucosal no mastoid effusion. Orbits: Globes and orbital soft tissues within normal limits. Patient status post lens  extraction bilaterally. CTA NECK FINDINGS Aortic arch: Penetrating atheromatous ulcer extending from the left lateral undersurface of the aortic arch again noted, measuring 15 mm on today's study, previously 14 mm. Visualized ascending aorta dilated up to 4.4 cm. No flow-limiting stenosis about the origin of the great vessels. Atheromatous irregularity within the right subclavian artery without stenosis. Severe stenosis involving the left subclavian artery of approximately 80% noted, stable (series 8, image 305). Right carotid system: Right common carotid artery patent from its origin to the bifurcation. Mild soft plaque about the right carotid bifurcation without flow-limiting stenosis. Right ICA patent to the skullbase without stenosis, dissection, or occlusion. Left carotid system: Left common carotid artery patent from its origin to the bifurcation. Postoperative changes from recent left carotid endarterectomy mild irregularity about the endarterectomy site without flow-limiting stenosis. Left ICA widely patent distally to the skullbase without stenosis, dissection, or occlusion. Incidental note made of fairly high-grade severe stenosis at the proximal left external carotid artery (series 8, image 207). Vertebral arteries: Both of the vertebral arteries arise from the subclavian arteries. Left vertebral artery dominant. Vertebral arteries patent within the neck without stenosis, dissection, or occlusion. Skeleton: No acute osseus abnormality. No worrisome lytic or blastic osseous lesions. Moderate degenerate spondylolysis at C3-4 through C6-7. Other neck: Postoperative changes from recent interval left carotid endarterectomy. Postoperative soft tissue swelling with emphysema within the left neck. JP drain remains in place. Patient is intubated with the endotracheal to in place. No complication. Upper chest: Visualized upper chest within normal limits. Atelectatic changes in within the partially visualized lungs.  Partially visualized lungs are otherwise clear. Review of the MIP images confirms the above findings CTA HEAD FINDINGS Anterior circulation: Petrous segments patent bilaterally. Mild atheromatous plaque within the cavernous ICAs without flow-limiting stenosis. ICA termini patent. A1 segments patent bilaterally. Anterior communicating artery normal. Anterior cerebral arteries patent to their distal aspects. M1 segments patent without stenosis or occlusion. MCA bifurcations normal. No proximal M2 occlusion. Distal MCA branches well opacified and symmetric. Atheromatous irregularity involving the left greater than right MCA branches noted. Posterior circulation: Dominant left vertebral artery patent to the vertebrobasilar junction without stenosis. Hypoplastic right vertebral artery largely terminates in PICA. Posterior inferior cerebral arteries themselves patent proximally. Basilar artery somewhat diminutive but widely patent to its distal aspect. Superior cerebral arteries patent bilaterally. Both of the posterior cerebral arteries primarily supplied via the basilar and are well opacified to their distal aspects. Mild to moderate atheromatous irregularity involving the PCAs bilaterally, similar to previous. Venous sinuses: Not well evaluated on this exam due to arterial timing of the contrast bolus. Anatomic variants: No anatomic variant. No aneurysm or vascular malformation. Delayed phase: No pathologic enhancement. Review of the MIP images confirms the above findings IMPRESSION: 1. Negative CTA for emergent large vessel occlusion. 2. Postoperative changes from recent left carotid endarterectomy without complication. No significant residual stenosis. 3. High-grade approximate 80% stenosis involving the mid left subclavian artery. 4. No other high-grade or critical flow limiting stenosis. 5. Penetrating aortic ulcer or, measuring 15 mm on today's exam, previously 14 mm on 11/01/2016. 6. Small vessel atheromatous  irregularity involving the distal left MCA and left greater than right PCA branches bilaterally. Critical Value/emergent results were called by telephone at  the time of interpretation on 11/03/2016 at 11:27 pm to Dr. Leonel Ramsay , who verbally acknowledged these results. Electronically Signed   By: Jeannine Boga M.D.   On: 11/04/2016 00:20     STUDIES:  CT Head 7/29 > Negative for bleed, negative acute process, patchy left frontal parietal white matter hypo-intensities  MR Brain 7/29 > Scattered small acute infarcts in the left cerebral hemisphere , greatest in the posterior frontal lobe near the vertex, mild chronic small vessel ischemic disease  CTA Head/Neck 7/30 > negative for large vessel occlusion, mild progression of bulky soft plaque at the left ICA, with 60% proximal left ICA stenosis CTA Head/Neck 8/2 > Negative for large vessel occlusion, high-grade stenosis of mid left subclavian artery, small vessel atheromatous irregularity involving the distal left MCA and left greater than right PCA branches bilaterally MR Brain 8/2 >>   CULTURES: MRSA by PCR 8/1 > Negative  Staphylococcus 8/1 > Negative   ANTIBIOTICS: Zinacef 8/2 >>   SIGNIFICANT EVENTS: 7/29 > ED > scattered emboli in left hemisphere  8/1 > CEA > post-operatively right side weakness > CTA negative   LINES/TUBES: ETT 8/1 >>   DISCUSSION: 83 year old female presents to ED on 7/29 with right leg weakness, found to have scattered emboli in left hemisphere. 8/1 underwent CEA post-operatively had new right side weakness > CT negative for occlusion or bleed. Remained intubated.   ASSESSMENT / PLAN:  PULMONARY A: Respiratory Insufficiency in post-operative setting  P:   Vent Support  VAP Bundle  Wean in AM  CXR/ABG now   CARDIOVASCULAR A:  Hypotension secondary to sedation  Internal Carotid Artery Stenosis  Distal Aortic Arch Ulcer - 4.6 cm ascending aortic aneurysm  H/O HTN and Dyslipidemia  P:  Cardiac  Monitoring  Vascular Following  Cardiology Following  Hold HTN medications  Continue Plavix and ASA per neurology   RENAL A:   No issues  P:   Trend BMP Replace electrolytes as needed   GASTROINTESTINAL A:   Dysphagia  P:   NPO PPI Speech evaluation after extubation   HEMATOLOGIC A:   Anticoagulation needs  P:  Trend CBC  SCDs  INFECTIOUS A:   No issues  P:   Trend WBC and Fever Curve  Continue Zinacef per Vascular   ENDOCRINE A:   No issues    P:   Trend Glucose   NEUROLOGIC A:   New Right side weakness post-op  Scattered CVC in left hemisphere  H/O Left MCA CVA  P:   RASS goal: -1/-2 Per Neurology  MRI pending  Propofol d/c due to hypotension  Fentanyl gtt  PRN versed   FAMILY  - Updates: Family updated at bedside   - Inter-disciplinary family meet or Palliative Care meeting due by:  11/11/2016   CC Time: 32 minutes   Hayden Pedro, AGACNP-BC [facility]  Pgr: [phone]  PCCM Pgr: [phone]  STAFF NOTE  I, Dr Seward Carol have personally reviewed patient's available data, including medical history, events of note, physical examination and test results as part of my evaluation. I have discussed with resident/NP and other care providers such as pharmacist, RN and RRT.  In addition,  I personally evaluated patient  83 yr old female with PMHx of CVA x 2  non compliant with anticoagulation (plavix) presented with new right sided weakness that she hoped would resolve and therefore didn't immediately call EMS presented to ER and  MRI on 7/29 read: Scattered  small acute infarcts in the left cerebral hemisphere greatest in the posterior frontal lobe near the vertex. Due to the high suspicion of a cardioembolic origin patient received a TEE on 7/31 which had an EF 55%-60% no PFO no thrombi and no vegetation. Carotid studies demonstrated Left ICA stenosis with soft plaque, patient received a Carotid Endartectomy. Post CEA  she was not "feeling right" developed a hematoma in the left neck and had 3/5 strength on the right side. She was taken to the OR for evacuation. She remains intubated and sedated.  She became agitated and Propofol ggt was started. Pt became hypotensive and sedation was switched to fentanyl ggt with versed pushes. SBP goal >90 to ensure perfusion. VTE and GI PPx.  CODE STATUS: FULL Rest per NP/medical resident whose note is outlined above and that I agree with  I discussed with daughter at bedside regarding critical condition of the patient, current interventions and possible outcomes.   The patient is critically ill with neurological injury and is being supported with mechanical ventilation and hemodynamic management.  Her care requires a high complexity decision making for assessment and support, frequent evaluation and titration of therapies, application of advanced monitoring technologies and extensive interpretation of multiple databases.   Critical Care Time devoted to patient care services described in this note is >30 Minutes. This time reflects time of care of this signee Dr Seward Carol. This critical care time does not reflect procedure time, or teaching time or supervisory time of PA/NP/Med student/Med Resident etc but could involve care discussion time    Dr. Seward Carol [facility]  11/04/2016 2:58 AM

## 2016-11-04 NOTE — Progress Notes (Signed)
STROKE TEAM PROGRESS NOTE   SUBJECTIVE (INTERVAL HISTORY) Her family members are at the bedside. She had left CEA done yesterday but developed neck hematoma needed urgent evacuation. Post op CTA head and neck showed patent left ICA. MRI today showed scattered left MCA infarcts. Pt still has left hemiparesis, still intubated on vent.   OBJECTIVE Temp:  [97.3 F (36.3 C)-98.4 F (36.9 C)] 97.3 F (36.3 C) (08/02 1217) Pulse Rate:  [30-89] 52 (08/02 1300) Cardiac Rhythm: Sinus bradycardia (08/02 0400) Resp:  [9-31] 16 (08/02 1300) BP: (78-188)/(32-100) 129/45 (08/02 1300) SpO2:  [91 %-100 %] 100 % (08/02 1300) Arterial Line BP: (63-178)/(47-107) 112/64 (08/02 0800) FiO2 (%):  [40 %-100 %] 40 % (08/02 1200) Weight:  [162 lb (73.5 kg)-165 lb 9.1 oz (75.1 kg)] 165 lb 9.1 oz (75.1 kg) (08/02 0424)  CBC:   Recent Labs Lab 10/31/16 1055  11/01/16 0341 11/03/16 0711 11/04/16 0358  WBC 9.6  --  7.0 7.3 12.8*  NEUTROABS 6.3  --  3.6  --   --   HGB 14.1  < > 13.0 14.7 12.1  HCT 42.0  < > 38.5 43.1 36.0  MCV 93.3  --  93.2 92.9 92.8  PLT 277  --  262 241 321  < > = values in this interval not displayed.  Basic Metabolic Panel:   Recent Labs Lab 11/03/16 0711 11/04/16 0358  NA 141 142  K 4.1 3.7  CL 109 111  CO2 22 23  GLUCOSE 94 158*  BUN 14 14  CREATININE 0.77 0.79  CALCIUM 8.7* 8.0*  MG  --  1.5*  PHOS  --  3.9    Lipid Panel:     Component Value Date/Time   CHOL 147 11/01/2016 0341   TRIG 71 11/04/2016 0358   HDL 42 11/01/2016 0341   CHOLHDL 3.5 11/01/2016 0341   VLDL 16 11/01/2016 0341   LDLCALC 89 11/01/2016 0341   HgbA1c:  Lab Results  Component Value Date   HGBA1C 5.7 (H) 11/01/2016   Urine Drug Screen:     Component Value Date/Time   LABOPIA NONE DETECTED 10/31/2016 1541   COCAINSCRNUR NONE DETECTED 10/31/2016 1541   LABBENZ NONE DETECTED 10/31/2016 1541   AMPHETMU NONE DETECTED 10/31/2016 1541   THCU NONE DETECTED 10/31/2016 1541   LABBARB NONE  DETECTED 10/31/2016 1541    Alcohol Level     Component Value Date/Time   ETH <5 10/31/2016 1055    IMAGING I have personally reviewed the radiological images below and agree with the radiology interpretations.  Ct Head Wo Contrast 10/31/2016 IMPRESSION: 1. Negative for bleed or other acute process. 2. Patchy left frontal parietal white matter hypointensities more conspicuous than on prior study.  Ct Angio Head W Or Wo Contrast Ct Angio Neck W Or Wo Contrast 11/01/2016 IMPRESSION: 1. Penetrating Aortic Ulcer (PAU) with progression since March, now measuring up to 14 mm. See series 9, image 171 and series 12, image 127. 2. Negative for large vessel occlusion. Mild progression of bulky soft plaque at the left ICA origin since March, with 60% proximal Left ICA stenosis. No other large vessel stenosis. 3. Mild to moderate atherosclerotic irregularity of the left MCA and left greater than right PCA branches. 4. Continued stable non contrast CT appearance of the brain.    Mr Brain Limited Wo Contrast 10/31/2016 IMPRESSION: 1. Scattered small acute infarcts in the left cerebral hemisphere, greatest in the posterior frontal lobe near the vertex. 2. Mild chronic small vessel  ischemic disease.  2D Echo 06/28/2016 Study Conclusions - Left ventricle: The cavity size was normal. There was mild concentric hypertrophy. Systolic function was normal. The estimated ejection fraction was in the range of 55% to 60%. Wall motion was normal; there were no regional wall motion abnormalities. Doppler parameters are consistent with abnormal left ventricular relaxation (grade 1 diastolic dysfunction).  Doppler parameters are consistent with high ventricular filling pressure. - Aortic valve: Transvalvular velocity was within the normal range.  There was no stenosis. There was no regurgitation. - Mitral valve: Transvalvular velocity was within the normal range.  There was no evidence for stenosis. There was no  regurgitation. - Left atrium: The atrium was moderately dilated. - Right ventricle: The cavity size was normal. Wall thickness was normal. Systolic function was normal. - Atrial septum: No defect or patent foramen ovale was identified. - Tricuspid valve: There was mild regurgitation. - Pulmonary arteries: Systolic pressure was within the normal   range. PA peak pressure: 30 mm Hg (S).  VAS [place] LE 06/28/2016 Summary: - No evidence of deep vein or superficial thrombosis involving the right lower extremity and left lower extremity.  TEE Normal LV function; no LAA thrombus; atrial septal aneurysm; negative saline microcavitation study.  Ct Angio Head and neck W Or Wo Contrast 11/04/2016 IMPRESSION: 1. Negative CTA for emergent large vessel occlusion. 2. Postoperative changes from recent left carotid endarterectomy without complication. No significant residual stenosis. 3. High-grade approximate 80% stenosis involving the mid left subclavian artery. 4. No other high-grade or critical flow limiting stenosis. 5. Penetrating aortic ulcer or, measuring 15 mm on today's exam, previously 14 mm on 11/01/2016. 6. Small vessel atheromatous irregularity involving the distal left MCA and left greater than right PCA branches bilaterally.   Mr Brain Wo Contrast 11/04/2016 IMPRESSION: Several new small acute embolic infarcts in the left cerebral hemisphere, predominantly in the posterior frontal lobe near the vertex.    PHYSICAL EXAM  Temp:  [97.3 F (36.3 C)-98.4 F (36.9 C)] 97.3 F (36.3 C) (08/02 1217) Pulse Rate:  [30-89] 52 (08/02 1300) Resp:  [9-31] 16 (08/02 1300) BP: (78-188)/(32-100) 129/45 (08/02 1300) SpO2:  [91 %-100 %] 100 % (08/02 1300) Arterial Line BP: (63-178)/(47-107) 112/64 (08/02 0800) FiO2 (%):  [40 %-100 %] 40 % (08/02 1200) Weight:  [162 lb (73.5 kg)-165 lb 9.1 oz (75.1 kg)] 165 lb 9.1 oz (75.1 kg) (08/02 0424)  General - Well nourished, well developed, intubated on  vent.  Ophthalmologic - disc not visualized due to noncooperation   Cardiovascular - Regular rate and rhythm.  Neuro - intubated on vent, eyes open, following simple commands. Eyes moving to both directions, PERRL, EOMI, blinking to visual threat bilaterally, facial symmetry and tongue protrusion not able to check due to ET tube. LUE and LLE 5/5. RUE 2+/5 and RLE 2+/5 proximal but 0/5 distally. DTR 1+ and no babinski. Sensation, coordination and gait not tested.   ASSESSMENT/PLAN Ms. TYJANAE BARTEK is a 83 y.o. female with history of cancer, history PSVT, palpitations, tobacco abuse, CVA in March, 2018 with residual proximal RUE weakness, HTN, who presented with right sided weakness. She did not receive IV t-PA due to arriving outside of the treatment window/presenting without cortical signs.  Stroke:   1.Scattered small acute infarcts in the left MCA and ACA, likely embolic, due to left ICA soft plaque with stenosis vs. cardioembolic with PSVT and suspicious for afib. 2. Recurrent scattered left MCA infarct s/p left CEA with neck hematoma after evacuation -  likely related to procedure complication of neck hematoma  Resultant  Right hemiparesis  CT head: Patchy left frontal parietal white matter hypointensities more conspicuous than on prior study.  CTA: increased left proximal ICA soft plaque and stenosis now at 60%. No LVO or high-grade stenosis.  14 mm penetrating aortic ulcer.  MRI head 10/31/16: Scattered small acute infarcts in the left cerebral hemisphere, greatest in the posterior frontal lobe near the vertex.   MRI head 11/04/16: Several new small acute embolic infarcts in the left cerebral hemisphere, predominantly in the posterior frontal lobe near the vertex.  2D Echo (06/28/2016): EF 60-65%. No source of embolus.   TEE unremarkable   Loop recorder placed  LDL 89   HgbA1c 5.7  SCDs for VTE prophylaxis  clopidogrel 75 mg daily prior to admission, now on aspirin 325 mg  daily and clopidogrel 75 mg daily. Continue DAPT for 3 months and then plavix alone.  Patient counseled to be compliant with her antithrombotic medications  Ongoing aggressive stroke risk factor management  Therapy recommendations:  pending  Disposition:  Pending  Left ICA stenosis with soft plaque s/p CEA  Could be the reason for recurrent left MCA / ACA infarcts  vascular surgery had left CEA on 11/03/16  Neck hematoma s/p evacuation  Continue DAPT  Aortic ulceration   Distal to the left CCA take off - unlikely the cause of her stroke  Recommend vascular surgery or CVST consultation for further monitoring and management  Hx of stroke  06/2016 right hand weakness - CTA head negative - CTA head left ICA 40-50% stenosis, aortic ulceration - MRI scattered left MCA infarcts - LDL 95 and A1C 5.6 - changed ASA to plavix and add lipitor - recommend outpt TEE and loop  08/2016 followed up in [place] with Dr. Leta Baptist - arranged TEE and loop but pt did not answer the arrangement with cardiology  Pt also noncompliant with plavix and lipitor  Hypertension  Unstable Permissive hypertension (OK if < 220/120) but gradually normalize in 5-7 days Long-term BP goal normotensive  Hyperlipidemia  Home meds: atorvastatin 20mg , not taking at home  LDL 89, goal < 70  Will change to pravastatin 20mg  for compliance  Continue statin at discharge   Tobacco abuse  Current smoker  Smoking cessation counseling provided  Nicotine patch provided  Pt is willing to quit  Other Stroke Risk Factors  Advanced age  ETOH use, advised to drink no more than 1 drink(s) a day  Other Active Problems  Intubated   Hospital day # 4  This patient is critically ill due to left MCA infarct s/p left CEA, respiratory failure, left neck hematoma s/p evacuation, right hemiparesis and at significant risk of neurological worsening, death form recurrent stroke, hemorrhagic conversion, seizure, bleeding.  This patient's care requires constant monitoring of vital signs, hemodynamics, respiratory and cardiac monitoring, review of multiple databases, neurological assessment, discussion with family, other specialists and medical decision making of high complexity. I spent 40 minutes of neurocritical care time in the care of this patient.   Regina Hawking, MD PhD Stroke Neurology 11/04/2016 2:33 PM   To contact Stroke Continuity provider, please refer to [email or web address] After hours, contact General Neurology

## 2016-11-04 NOTE — Progress Notes (Signed)
Per CRNA at bedside report, the patient's arterial line has not been accurate with her cuff pressure, which is suspected d.t her PMHx of left subclavian stenosis.  Cuff pressures have been reading at least 30-40 mmhg different systolic from the a-line.  RN to titrate drips to reflect this.  RN will continue to monitor patient carefully.

## 2016-11-04 NOTE — Progress Notes (Signed)
PULMONARY / CRITICAL CARE MEDICINE   Name: BRAYLEA BRANCATO MRN: [id number] DOB: [birth year]    ADMISSION DATE:  10/31/2016 CONSULTATION DATE:  11/04/2016  REFERRING MD:  Dr. 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 MRI with scattered small acute infarcts in the left cerebral hemisphere. 8/1 underwent CEA, found to have left carotid hematoma and right sided weakness so taken back to OR for evacuation, where a 5 x 5 cm hematoma was evacuated. Post-operatively Emergent head CT without hemorrhage or large vessel occlusion. Remained intubated. PCCM consulted.   SUBJECTIVE:   VITAL SIGNS: BP (!) 161/57   Pulse (!) 50   Temp 98 F (36.7 C) (Oral)   Resp 16   Ht 5\' 6"  (1.676 m)   Wt 75.1 kg (165 lb 9.1 oz)   SpO2 100%   BMI 26.72 kg/m   HEMODYNAMICS:    VENTILATOR SETTINGS: Vent Mode: PRVC FiO2 (%):  [50 %-100 %] 50 % Set Rate:  [16 bmp] 16 bmp Vt Set:  [480 mL] 480 mL PEEP:  [5 cmH20] 5 cmH20 Plateau Pressure:  [15 cmH20-17 cmH20] 17 cmH20  INTAKE / OUTPUT: I/O last 3 completed shifts: In: 4021.7 [P.O.:150; I.V.:3871.7] Out: 1320 [Urine:950; Drains:20; Blood:350]  PHYSICAL EXAMINATION: General:  Intubated and sedated Neuro:  Sedated, no spont movement or resp drive HEENT:  ETT in place, JP drain in place Cardiovascular:  Regular, brady, sinus, no M, no edema Lungs:  Clear bilaterally Abdomen: soft, benign, + BS Musculoskeletal: no deformity Skin: no rash  LABS:  BMET  Recent Labs Lab 11/02/16 0658 11/03/16 0711 11/04/16 0358  NA 143 141 142  K 3.9 4.1 3.7  CL 110 109 111  CO2 24 22 23   BUN 9 14 14   CREATININE 0.77 0.77 0.79  GLUCOSE 108* 94 158*     Electrolytes  Recent Labs Lab 11/02/16 0658 11/03/16 0711 11/04/16 0358  CALCIUM 8.9 8.7* 8.0*  MG  --   --  1.5*  PHOS  --   --  3.9    CBC  Recent Labs Lab 11/01/16 0341 11/03/16 0711 11/04/16 0358  WBC 7.0 7.3 12.8*  HGB 13.0 14.7 12.1  HCT 38.5 43.1 36.0  PLT 262 241 321    Coag's  Recent Labs Lab 10/31/16 1055 11/02/16 0658  APTT 39*  --   INR 1.10 1.06    Sepsis Markers No results for input(s): LATICACIDVEN, PROCALCITON, O2SATVEN in the last 168 hours.  ABG  Recent Labs Lab 11/04/16 0135  PHART 7.418  PCO2ART 35.6  PO2ART 231*    Liver Enzymes  Recent Labs Lab 10/31/16 1055  AST 37  ALT 38  ALKPHOS 106  BILITOT 0.8  ALBUMIN 3.5    Cardiac Enzymes No results for input(s): TROPONINI, PROBNP in the last 168 hours.  Glucose No results for input(s): GLUCAP in the last 168 hours.  Imaging Ct Angio Head W Or Wo Contrast  Result Date: 11/04/2016 CLINICAL DATA:  Initial evaluation for acute right-sided weakness, recently postop from carotid endarterectomy. EXAM: CT ANGIOGRAPHY HEAD AND NECK TECHNIQUE: Multidetector CT imaging of the head and neck was performed using the standard protocol during bolus administration of intravenous contrast. Multiplanar CT image reconstructions and  MIPs were obtained to evaluate the vascular anatomy. Carotid stenosis measurements (when applicable) are obtained utilizing NASCET criteria, using the distal internal carotid diameter as the denominator. CONTRAST:  50 cc of Isovue 370. COMPARISON:  Prior CT from 11/01/2016. FINDINGS: CT HEAD FINDINGS Brain: Age related cerebral atrophy with chronic small vessel ischemic disease. No acute intracranial hemorrhage. No evidence for acute large vessel territory infarct. No mass lesion, midline shift or mass effect. No hydrocephalus. No extra-axial fluid collection. Vascular: No hyperdense vessel. Scattered vascular calcifications noted within the carotid siphons. Skull: Scalp  soft tissues and calvarium within normal limits. Scattered mucosal no mastoid effusion. Orbits: Globes and orbital soft tissues within normal limits. Patient status post lens extraction bilaterally. CTA NECK FINDINGS Aortic arch: Penetrating atheromatous ulcer extending from the left lateral undersurface of the aortic arch again noted, measuring 15 mm on today's study, previously 14 mm. Visualized ascending aorta dilated up to 4.4 cm. No flow-limiting stenosis about the origin of the great vessels. Atheromatous irregularity within the right subclavian artery without stenosis. Severe stenosis involving the left subclavian artery of approximately 80% noted, stable (series 8, image 305). Right carotid system: Right common carotid artery patent from its origin to the bifurcation. Mild soft plaque about the right carotid bifurcation without flow-limiting stenosis. Right ICA patent to the skullbase without stenosis, dissection, or occlusion. Left carotid system: Left common carotid artery patent from its origin to the bifurcation. Postoperative changes from recent left carotid endarterectomy mild irregularity about the endarterectomy site without flow-limiting stenosis. Left ICA widely patent distally to the skullbase without stenosis, dissection, or occlusion. Incidental note made of fairly high-grade severe stenosis at the proximal left external carotid artery (series 8, image 207). Vertebral arteries: Both of the vertebral arteries arise from the subclavian arteries. Left vertebral artery dominant. Vertebral arteries patent within the neck without stenosis, dissection, or occlusion. Skeleton: No acute osseus abnormality. No worrisome lytic or blastic osseous lesions. Moderate degenerate spondylolysis at C3-4 through C6-7. Other neck: Postoperative changes from recent interval left carotid endarterectomy. Postoperative soft tissue swelling with emphysema within the left neck. JP drain remains in place. Patient is  intubated with the endotracheal to in place. No complication. Upper chest: Visualized upper chest within normal limits. Atelectatic changes in within the partially visualized lungs. Partially visualized lungs are otherwise clear. Review of the MIP images confirms the above findings CTA HEAD FINDINGS Anterior circulation: Petrous segments patent bilaterally. Mild atheromatous plaque within the cavernous ICAs without flow-limiting stenosis. ICA termini patent. A1 segments patent bilaterally. Anterior communicating artery normal. Anterior cerebral arteries patent to their distal aspects. M1 segments patent without stenosis or occlusion. MCA bifurcations normal. No proximal M2 occlusion. Distal MCA branches well opacified and symmetric. Atheromatous irregularity involving the left greater than right MCA branches noted. Posterior circulation: Dominant left vertebral artery patent to the vertebrobasilar junction without stenosis. Hypoplastic right vertebral artery largely terminates in PICA. Posterior inferior cerebral arteries themselves patent proximally. Basilar artery somewhat diminutive but widely patent to its distal aspect. Superior cerebral arteries patent bilaterally. Both of the posterior cerebral arteries primarily supplied via the basilar and are well opacified to their distal aspects. Mild to moderate atheromatous irregularity involving the PCAs bilaterally, similar to previous. Venous sinuses: Not well evaluated on this exam due to arterial timing of the contrast bolus. Anatomic variants: No anatomic variant. No aneurysm or vascular malformation. Delayed phase: No pathologic enhancement. Review of the MIP images confirms the above findings IMPRESSION: 1. Negative CTA for emergent large vessel  occlusion. 2. Postoperative changes from recent left carotid endarterectomy without complication. No significant residual stenosis. 3. High-grade approximate 80% stenosis involving the mid left subclavian artery. 4. No  other high-grade or critical flow limiting stenosis. 5. Penetrating aortic ulcer or, measuring 15 mm on today's exam, previously 14 mm on 11/01/2016. 6. Small vessel atheromatous irregularity involving the distal left MCA and left greater than right PCA branches bilaterally. Critical Value/emergent results were called by telephone at the time of interpretation on 11/03/2016 at 11:27 pm to Dr. Leonel Ramsay , who verbally acknowledged these results. Electronically Signed   By: Jeannine Boga M.D.   On: 11/04/2016 00:20   Ct Angio Neck W Or Wo Contrast  Result Date: 11/04/2016 CLINICAL DATA:  Initial evaluation for acute right-sided weakness, recently postop from carotid endarterectomy. EXAM: CT ANGIOGRAPHY HEAD AND NECK TECHNIQUE: Multidetector CT imaging of the head and neck was performed using the standard protocol during bolus administration of intravenous contrast. Multiplanar CT image reconstructions and MIPs were obtained to evaluate the vascular anatomy. Carotid stenosis measurements (when applicable) are obtained utilizing NASCET criteria, using the distal internal carotid diameter as the denominator. CONTRAST:  50 cc of Isovue 370. COMPARISON:  Prior CT from 11/01/2016. FINDINGS: CT HEAD FINDINGS Brain: Age related cerebral atrophy with chronic small vessel ischemic disease. No acute intracranial hemorrhage. No evidence for acute large vessel territory infarct. No mass lesion, midline shift or mass effect. No hydrocephalus. No extra-axial fluid collection. Vascular: No hyperdense vessel. Scattered vascular calcifications noted within the carotid siphons. Skull: Scalp soft tissues and calvarium within normal limits. Scattered mucosal no mastoid effusion. Orbits: Globes and orbital soft tissues within normal limits. Patient status post lens extraction bilaterally. CTA NECK FINDINGS Aortic arch: Penetrating atheromatous ulcer extending from the left lateral undersurface of the aortic arch again noted,  measuring 15 mm on today's study, previously 14 mm. Visualized ascending aorta dilated up to 4.4 cm. No flow-limiting stenosis about the origin of the great vessels. Atheromatous irregularity within the right subclavian artery without stenosis. Severe stenosis involving the left subclavian artery of approximately 80% noted, stable (series 8, image 305). Right carotid system: Right common carotid artery patent from its origin to the bifurcation. Mild soft plaque about the right carotid bifurcation without flow-limiting stenosis. Right ICA patent to the skullbase without stenosis, dissection, or occlusion. Left carotid system: Left common carotid artery patent from its origin to the bifurcation. Postoperative changes from recent left carotid endarterectomy mild irregularity about the endarterectomy site without flow-limiting stenosis. Left ICA widely patent distally to the skullbase without stenosis, dissection, or occlusion. Incidental note made of fairly high-grade severe stenosis at the proximal left external carotid artery (series 8, image 207). Vertebral arteries: Both of the vertebral arteries arise from the subclavian arteries. Left vertebral artery dominant. Vertebral arteries patent within the neck without stenosis, dissection, or occlusion. Skeleton: No acute osseus abnormality. No worrisome lytic or blastic osseous lesions. Moderate degenerate spondylolysis at C3-4 through C6-7. Other neck: Postoperative changes from recent interval left carotid endarterectomy. Postoperative soft tissue swelling with emphysema within the left neck. JP drain remains in place. Patient is intubated with the endotracheal to in place. No complication. Upper chest: Visualized upper chest within normal limits. Atelectatic changes in within the partially visualized lungs. Partially visualized lungs are otherwise clear. Review of the MIP images confirms the above findings CTA HEAD FINDINGS Anterior circulation: Petrous segments  patent bilaterally. Mild atheromatous plaque within the cavernous ICAs without flow-limiting stenosis. ICA termini patent. A1 segments  patent bilaterally. Anterior communicating artery normal. Anterior cerebral arteries patent to their distal aspects. M1 segments patent without stenosis or occlusion. MCA bifurcations normal. No proximal M2 occlusion. Distal MCA branches well opacified and symmetric. Atheromatous irregularity involving the left greater than right MCA branches noted. Posterior circulation: Dominant left vertebral artery patent to the vertebrobasilar junction without stenosis. Hypoplastic right vertebral artery largely terminates in PICA. Posterior inferior cerebral arteries themselves patent proximally. Basilar artery somewhat diminutive but widely patent to its distal aspect. Superior cerebral arteries patent bilaterally. Both of the posterior cerebral arteries primarily supplied via the basilar and are well opacified to their distal aspects. Mild to moderate atheromatous irregularity involving the PCAs bilaterally, similar to previous. Venous sinuses: Not well evaluated on this exam due to arterial timing of the contrast bolus. Anatomic variants: No anatomic variant. No aneurysm or vascular malformation. Delayed phase: No pathologic enhancement. Review of the MIP images confirms the above findings IMPRESSION: 1. Negative CTA for emergent large vessel occlusion. 2. Postoperative changes from recent left carotid endarterectomy without complication. No significant residual stenosis. 3. High-grade approximate 80% stenosis involving the mid left subclavian artery. 4. No other high-grade or critical flow limiting stenosis. 5. Penetrating aortic ulcer or, measuring 15 mm on today's exam, previously 14 mm on 11/01/2016. 6. Small vessel atheromatous irregularity involving the distal left MCA and left greater than right PCA branches bilaterally. Critical Value/emergent results were called by telephone at the  time of interpretation on 11/03/2016 at 11:27 pm to Dr. Leonel Ramsay , who verbally acknowledged these results. Electronically Signed   By: Jeannine Boga M.D.   On: 11/04/2016 00:20   Dg Chest Port 1 View  Result Date: 11/04/2016 CLINICAL DATA:  Endotracheal tube adjustment. EXAM: PORTABLE CHEST 1 VIEW COMPARISON:  11/04/2016 FINDINGS: Endotracheal tube tip measures about 4.8 cm above the carina. Linear atelectasis in the left lung base. No airspace disease or consolidation in the lungs. No blunting of costophrenic angles. No pneumothorax. Mediastinal contours appear intact. Degenerative changes in the shoulders. IMPRESSION: Enteric tube tip measures 4.8 cm above the carina. Linear atelectasis in the left lung base. Electronically Signed   By: Lucienne Capers M.D.   On: 11/04/2016 01:28   Dg Chest Port 1 View  Result Date: 11/04/2016 CLINICAL DATA:  Intubation EXAM: PORTABLE CHEST 1 VIEW COMPARISON:  02/12/2013 FINDINGS: Endotracheal tube with tip measuring 5.2 cm above the carina. Normal heart size and pulmonary vascularity. Lungs are clear and expanded. No blunting of costophrenic angles. No pneumothorax. There appears to be a loop recorder present. IMPRESSION: Endotracheal tube appears in satisfactory position. No evidence of active pulmonary disease. Electronically Signed   By: Lucienne Capers M.D.   On: 11/04/2016 01:00     STUDIES:  CT Head 7/29 > Negative for bleed, negative acute process, patchy left frontal parietal white matter hypo-intensities  MR Brain 7/29 > Scattered small acute infarcts in the left cerebral hemisphere , greatest in the posterior frontal lobe near the vertex, mild chronic small vessel ischemic disease  CTA Head/Neck 7/30 > negative for large vessel occlusion, mild progression of bulky soft plaque at the left ICA, with 60% proximal left ICA stenosis CTA Head/Neck 8/2 > Negative for large vessel occlusion, high-grade stenosis of mid left subclavian artery, small  vessel atheromatous irregularity involving the distal left MCA and left greater than right PCA branches bilaterally MR Brain 8/2 >>   CULTURES: MRSA by PCR 8/1 > Negative  Staphylococcus 8/1 > Negative   ANTIBIOTICS: Zinacef 8/2 >>  SIGNIFICANT EVENTS: 7/29 > ED > scattered emboli in left hemisphere  8/1 > CEA > post-operatively right side weakness > CTA negative   LINES/TUBES: ETT 8/1 >>  JP drain neck 8/1 >> 8/2  DISCUSSION: 83 year old female presents to ED on 7/29 with right leg weakness, found to have scattered emboli in left hemisphere. 8/1 underwent CEA post-operatively had new right side weakness > CT negative for occlusion or bleed. Remained intubated.   ASSESSMENT / PLAN:  PULMONARY A: Respiratory Insufficiency in post-operative setting  P:   Goal wake up and SBT this am 8/2 VAP prevention orders Follow airway protection, swallowing post extubation  CARDIOVASCULAR A:  Hypotension secondary to sedation  Internal Carotid Artery Stenosis  Distal Aortic Arch Ulcer - 4.6 cm ascending aortic aneurysm  H/O HTN and Dyslipidemia  P:  JP drain and post-op management as per Dr Oneida Alar Cardiology Following  Hold HTN meds, weaning pressors to off  plavix and ASA as ordered  RENAL A:   No issues  P:   Follow BMP and UOP  GASTROINTESTINAL A:   Dysphagia  P:   Will need formal SLP eval post-extubation  HEMATOLOGIC A:   Anticoagulation needs  P:  scd Follow CBC  INFECTIOUS A:   No issues  P:   Zinacef surgical prop[hylaxis  Follow Clinically, WBC  ENDOCRINE A:   No issues    P:    NEUROLOGIC A:   New Right side weakness post-op  Scattered CVC in left hemisphere  H/O Left MCA CVA  P:   RASS goal: -1 Appreciate Neurology assistance Final read MRI pending Lighten sedation 8/2 am and assess for neuro deficits  FAMILY  - Updates: No family present am 8/2  - Inter-disciplinary family meet or Palliative Care meeting due by:   11/11/2016  Independent CC time 38 minutes  Baltazar Apo, MD, PhD 11/04/2016, 9:08 AM [facility] [phone] or if no answer [phone]

## 2016-11-04 NOTE — Progress Notes (Signed)
Overnight events noted. Following CEA, patient noted to have L carotid hematoma, returned to OR for hematoma evacuation. Patient since remained intubated post-op. Critical Care now following. Plans for SBT today.

## 2016-11-04 NOTE — Progress Notes (Signed)
50 mcg injection of fentanyl wasted in sharps with Naaman Plummer RN as second Psychologist, counselling

## 2016-11-04 NOTE — Progress Notes (Signed)
RT NOTE :  Pt transported to MRI @ 0540. Placed on MRI vent, no event day shift RT will note when they arrive back to unit

## 2016-11-04 NOTE — Progress Notes (Signed)
[facility]  Subjective  - sedated unresponsive   Objective (!) 161/57 (!) 50 98 F (36.7 C) (Oral) 16 100%  Intake/Output Summary (Last 24 hours) at 11/04/16 0811 Last data filed at 11/04/16 0600  Gross per 24 hour  Intake          3871.65 ml  Output             1320 ml  Net          2551.65 ml   Neck without hematoma, drain minimal  Assessment/Planning: Post left CEA left periop stroke Neuro following Will d/c JP drain Hopefully extubate this morning  Ruta Hinds 11/04/2016 8:11 AM --  Laboratory Lab Results:  Recent Labs  11/03/16 0711 11/04/16 0358  WBC 7.3 12.8*  HGB 14.7 12.1  HCT 43.1 36.0  PLT 241 321   BMET  Recent Labs  11/03/16 0711 11/04/16 0358  NA 141 142  K 4.1 3.7  CL 109 111  CO2 22 23  GLUCOSE 94 158*  BUN 14 14  CREATININE 0.77 0.79  CALCIUM 8.7* 8.0*    COAG Lab Results  Component Value Date   INR 1.06 11/02/2016   INR 1.10 10/31/2016   INR 1.04 06/27/2016   No results found for: PTT

## 2016-11-05 ENCOUNTER — Telehealth: Payer: Self-pay | Admitting: Vascular Surgery

## 2016-11-05 LAB — BASIC METABOLIC PANEL
Anion gap: 6 (ref 5–15)
Anion gap: 4 — ABNORMAL LOW (ref 5–15)
BUN: 17 mg/dL (ref 6–20)
BUN: 15 mg/dL (ref 6–20)
CO2: 22 mmol/L (ref 22–32)
CO2: 24 mmol/L (ref 22–32)
Calcium: 8 mg/dL — ABNORMAL LOW (ref 8.9–10.3)
Calcium: 8.2 mg/dL — ABNORMAL LOW (ref 8.9–10.3)
Chloride: 112 mmol/L — ABNORMAL HIGH (ref 101–111)
Chloride: 111 mmol/L (ref 101–111)
Creatinine, Ser: 0.68 mg/dL (ref 0.44–1.00)
Creatinine, Ser: 0.53 mg/dL (ref 0.44–1.00)
GFR calc Af Amer: >60 mL/min (ref >60)
GFR calc Af Amer: >60 mL/min (ref >60)
GFR calc non Af Amer: >60 mL/min (ref >60)
GFR calc non Af Amer: >60 mL/min (ref >60)
Glucose, Bld: 99 mg/dL (ref 65–99)
Glucose, Bld: 111 mg/dL — ABNORMAL HIGH (ref 65–99)
Potassium: 5.8 mmol/L — ABNORMAL HIGH (ref 3.5–5.1)
Potassium: 3.7 mmol/L (ref 3.5–5.1)
Sodium: 140 mmol/L (ref 135–145)
Sodium: 139 mmol/L (ref 135–145)

## 2016-11-05 LAB — GLUCOSE, CAPILLARY
Glucose-Capillary: 101 mg/dL — ABNORMAL HIGH (ref 65–99)
Glucose-Capillary: 109 mg/dL — ABNORMAL HIGH (ref 65–99)
Glucose-Capillary: 112 mg/dL — ABNORMAL HIGH (ref 65–99)
Glucose-Capillary: 84 mg/dL (ref 65–99)

## 2016-11-05 LAB — CBC
HCT: 33.9 % — ABNORMAL LOW (ref 36.0–46.0)
Hemoglobin: 11.3 g/dL — ABNORMAL LOW (ref 12.0–15.0)
MCH: 32.6 pg (ref 26.0–34.0)
MCHC: 33.3 g/dL (ref 30.0–36.0)
MCV: 97.7 fL (ref 78.0–100.0)
Platelets: 280 10*3/uL (ref 150–400)
RBC: 3.47 MIL/uL — ABNORMAL LOW (ref 3.87–5.11)
RDW: 15.8 % — ABNORMAL HIGH (ref 11.5–15.5)
WBC: 15.2 10*3/uL — ABNORMAL HIGH (ref 4.0–10.5)

## 2016-11-05 MED ORDER — PANTOPRAZOLE SODIUM 40 MG PO TBEC
40.0000 mg | DELAYED_RELEASE_TABLET | Freq: Every day | ORAL | Status: DC
  Administered 2016-11-07 – 2016-11-10 (×4): 40 mg via ORAL
  Filled 2016-11-05 (×4): qty 1

## 2016-11-05 MED ORDER — DOCUSATE SODIUM 50 MG/5ML PO LIQD
100.0000 mg | Freq: Every day | ORAL | Status: DC
  Administered 2016-11-05: 100 mg

## 2016-11-05 MED ORDER — HYDRALAZINE HCL 20 MG/ML IJ SOLN: 10.0000 mg | Freq: Four times a day (QID) | INTRAMUSCULAR | Status: DC | PRN

## 2016-11-05 MED ORDER — ADULT MULTIVITAMIN LIQUID CH
15.0000 mL | Freq: Every day | ORAL | Status: DC
  Administered 2016-11-05: 15 mL via ORAL

## 2016-11-05 NOTE — Progress Notes (Signed)
STROKE TEAM PROGRESS NOTE   SUBJECTIVE (INTERVAL HISTORY) Her daughter is at the bedside. She was more awake alert today, doing well with SBT. Plan to extubate today. Her right UE and LE strength much improved.   OBJECTIVE Temp:  [97.4 F (36.3 C)-97.8 F (36.6 C)] 97.8 F (36.6 C) (08/03 1149) Pulse Rate:  [43-70] 63 (08/03 1149) Cardiac Rhythm: Sinus bradycardia (08/03 0800) Resp:  [11-24] 18 (08/03 1149) BP: (111-176)/(49-117) 173/73 (08/03 1149) SpO2:  [100 %] 100 % (08/03 1100) FiO2 (%):  [40 %] 40 % (08/03 0811) Weight:  [166 lb 10.7 oz (75.6 kg)] 166 lb 10.7 oz (75.6 kg) (08/03 0417)  CBC:   Recent Labs Lab 10/31/16 1055  11/01/16 0341  11/04/16 0358 11/05/16 0526  WBC 9.6  --  7.0  < > 12.8* 15.2*  NEUTROABS 6.3  --  3.6  --   --   --   HGB 14.1  < > 13.0  < > 12.1 11.3*  HCT 42.0  < > 38.5  < > 36.0 33.9*  MCV 93.3  --  93.2  < > 92.8 97.7  PLT 277  --  262  < > 321 280  < > = values in this interval not displayed.  Basic Metabolic Panel:   Recent Labs Lab 11/04/16 0358 11/05/16 0526  NA 142 140  K 3.7 5.8*  CL 111 112*  CO2 23 22  GLUCOSE 158* 99  BUN 14 17  CREATININE 0.79 0.68  CALCIUM 8.0* 8.0*  MG 1.5*  --   PHOS 3.9  --     Lipid Panel:     Component Value Date/Time   CHOL 147 11/01/2016 0341   TRIG 71 11/04/2016 0358   HDL 42 11/01/2016 0341   CHOLHDL 3.5 11/01/2016 0341   VLDL 16 11/01/2016 0341   LDLCALC 89 11/01/2016 0341   HgbA1c:  Lab Results  Component Value Date   HGBA1C 5.7 (H) 11/01/2016   Urine Drug Screen:     Component Value Date/Time   LABOPIA NONE DETECTED 10/31/2016 1541   COCAINSCRNUR NONE DETECTED 10/31/2016 1541   LABBENZ NONE DETECTED 10/31/2016 1541   AMPHETMU NONE DETECTED 10/31/2016 1541   THCU NONE DETECTED 10/31/2016 1541   LABBARB NONE DETECTED 10/31/2016 1541    Alcohol Level     Component Value Date/Time   ETH <5 10/31/2016 1055    IMAGING I have personally reviewed the radiological images  below and agree with the radiology interpretations.  Ct Head Wo Contrast 10/31/2016 IMPRESSION: 1. Negative for bleed or other acute process. 2. Patchy left frontal parietal white matter hypointensities more conspicuous than on prior study.  Ct Angio Head W Or Wo Contrast Ct Angio Neck W Or Wo Contrast 11/01/2016 IMPRESSION: 1. Penetrating Aortic Ulcer (PAU) with progression since March, now measuring up to 14 mm. See series 9, image 171 and series 12, image 127. 2. Negative for large vessel occlusion. Mild progression of bulky soft plaque at the left ICA origin since March, with 60% proximal Left ICA stenosis. No other large vessel stenosis. 3. Mild to moderate atherosclerotic irregularity of the left MCA and left greater than right PCA branches. 4. Continued stable non contrast CT appearance of the brain.    Mr Brain Limited Wo Contrast 10/31/2016 IMPRESSION: 1. Scattered small acute infarcts in the left cerebral hemisphere, greatest in the posterior frontal lobe near the vertex. 2. Mild chronic small vessel ischemic disease.  2D Echo 06/28/2016 Study Conclusions - Left ventricle:  The cavity size was normal. There was mild concentric hypertrophy. Systolic function was normal. The estimated ejection fraction was in the range of 55% to 60%. Wall motion was normal; there were no regional wall motion abnormalities. Doppler parameters are consistent with abnormal left ventricular relaxation (grade 1 diastolic dysfunction).  Doppler parameters are consistent with high ventricular filling pressure. - Aortic valve: Transvalvular velocity was within the normal range.  There was no stenosis. There was no regurgitation. - Mitral valve: Transvalvular velocity was within the normal range.  There was no evidence for stenosis. There was no regurgitation. - Left atrium: The atrium was moderately dilated. - Right ventricle: The cavity size was normal. Wall thickness was normal. Systolic function was normal. -  Atrial septum: No defect or patent foramen ovale was identified. - Tricuspid valve: There was mild regurgitation. - Pulmonary arteries: Systolic pressure was within the normal   range. PA peak pressure: 30 mm Hg (S).  VAS [place] LE 06/28/2016 Summary: - No evidence of deep vein or superficial thrombosis involving the right lower extremity and left lower extremity.  TEE Normal LV function; no LAA thrombus; atrial septal aneurysm; negative saline microcavitation study.  Ct Angio Head and neck W Or Wo Contrast 11/04/2016 IMPRESSION: 1. Negative CTA for emergent large vessel occlusion. 2. Postoperative changes from recent left carotid endarterectomy without complication. No significant residual stenosis. 3. High-grade approximate 80% stenosis involving the mid left subclavian artery. 4. No other high-grade or critical flow limiting stenosis. 5. Penetrating aortic ulcer or, measuring 15 mm on today's exam, previously 14 mm on 11/01/2016. 6. Small vessel atheromatous irregularity involving the distal left MCA and left greater than right PCA branches bilaterally.   Mr Brain Wo Contrast 11/04/2016 IMPRESSION: Several new small acute embolic infarcts in the left cerebral hemisphere, predominantly in the posterior frontal lobe near the vertex.    PHYSICAL EXAM  Temp:  [97.4 F (36.3 C)-97.8 F (36.6 C)] 97.8 F (36.6 C) (08/03 1149) Pulse Rate:  [43-70] 63 (08/03 1149) Resp:  [11-24] 18 (08/03 1149) BP: (111-176)/(49-117) 173/73 (08/03 1149) SpO2:  [100 %] 100 % (08/03 1100) FiO2 (%):  [40 %] 40 % (08/03 0811) Weight:  [166 lb 10.7 oz (75.6 kg)] 166 lb 10.7 oz (75.6 kg) (08/03 0417)  General - Well nourished, well developed, intubated on low dose sedation.  Ophthalmologic - disc not visualized due to noncooperation   Cardiovascular - Regular rate and rhythm.  Neuro - intubated on low dose fetanyl, eyes open, following simple commands. Eyes moving to both directions, PERRL, EOMI, blinking to  visual threat bilaterally, facial symmetry and tongue protrusion not able to check due to ET tube. LUE and LLE 5/5. RUE 3/5 and RLE 4-/5 proximal and 2+/5 distally. DTR 1+ and no babinski. Sensation, coordination and gait not tested.   ASSESSMENT/PLAN [name] is a 83 y.o. female with history of cancer, history PSVT, palpitations, tobacco abuse, CVA in March, 2018 with residual proximal RUE weakness, HTN, who presented with right sided weakness. She did not receive IV t-PA due to arriving outside of the treatment window/presenting without cortical signs.  Stroke:   1.Scattered small acute infarcts in the left MCA and ACA, likely embolic, due to left ICA soft plaque with stenosis vs. cardioembolic with PSVT and suspicious for afib. 2. Recurrent scattered left MCA infarct s/p left CEA with neck hematoma after evacuation - likely related to procedure complication of neck hematoma  Resultant  Right hemiparesis  CT head: Patchy left  frontal parietal white matter hypointensities more conspicuous than on prior study.  CTA: increased left proximal ICA soft plaque and stenosis now at 60%. No LVO or high-grade stenosis.  14 mm penetrating aortic ulcer.  MRI head 10/31/16: Scattered small acute infarcts in the left cerebral hemisphere, greatest in the posterior frontal lobe near the vertex.   MRI head 11/04/16: Several new small acute embolic infarcts in the left cerebral hemisphere, predominantly in the posterior frontal lobe near the vertex.  2D Echo (06/28/2016): EF 60-65%. No source of embolus.   TEE unremarkable   Loop recorder placed  LDL 89   HgbA1c 5.7  SCDs for VTE prophylaxis  clopidogrel 75 mg daily prior to admission, now on aspirin 325 mg daily and clopidogrel 75 mg daily. Continue DAPT for 3 months and then plavix alone.  Patient counseled to be compliant with her antithrombotic medications  Ongoing aggressive stroke risk factor management  Therapy recommendations:   pending  Disposition:  Pending  Left ICA stenosis with soft plaque s/p CEA  Could be the reason for recurrent left MCA / ACA infarcts  left CEA on 11/03/16  Neck hematoma s/p evacuation  Continue DAPT  Aortic ulceration   Distal to the left CCA take off - unlikely the cause of her stroke  Recommend vascular surgery or CVST consultation for further monitoring and management  Hx of stroke  06/2016 right hand weakness - CTA head negative - CTA head left ICA 40-50% stenosis, aortic ulceration - MRI scattered left MCA infarcts - LDL 95 and A1C 5.6 - changed ASA to plavix and add lipitor - recommend outpt TEE and loop  08/2016 followed up in [place] with Dr. Leta Baptist - arranged TEE and loop but pt did not answer the arrangement with cardiology  Pt also noncompliant with plavix and lipitor  Hypertension  Unstable Permissive hypertension (OK if < 220/120) but gradually normalize in 5-7 days Long-term BP goal normotensive  Hyperlipidemia  Home meds: atorvastatin 20mg , not taking at home  LDL 89, goal < 70  Will change to pravastatin 20mg  for compliance  Continue statin at discharge   Tobacco abuse  Current smoker  Smoking cessation counseling provided  Nicotine patch provided  Pt is willing to quit  Other Stroke Risk Factors  Advanced age  ETOH use, advised to drink no more than 1 drink(s) a day  Other Active Problems  Extubate today   Hospital day # 5  This patient is critically ill due to left MCA infarct s/p left CEA, respiratory failure, left neck hematoma s/p evacuation, right hemiparesis and at significant risk of neurological worsening, death form recurrent stroke, hemorrhagic conversion, seizure, bleeding. This patient's care requires constant monitoring of vital signs, hemodynamics, respiratory and cardiac monitoring, review of multiple databases, neurological assessment, discussion with family, other specialists and medical decision making of high  complexity. I had long discussion with daughter and pt at bedside, updated pt current condition, treatment plan and potential prognosis. They expressed understanding and appreciation. I spent 40 minutes of neurocritical care time in the care of this patient.   [name], MD PhD Stroke Neurology 11/05/2016 1:33 PM   To contact Stroke Continuity provider, please refer to [email or web address] After hours, contact General Neurology

## 2016-11-05 NOTE — Progress Notes (Signed)
PT Cancellation Note  Patient Details Name: Regina James MRN: [id number] DOB: [birth year]   Cancelled Treatment:    Reason Eval/Treat Not Completed: Medical issues which prohibited therapy (pt remains on vent with medical decline. Will check on next week for appropriateness to proceed with therapy)   Maddilyn Campus B Braxden Lovering 11/05/2016, 7:07 AM  Elwyn Reach, [place]

## 2016-11-05 NOTE — Anesthesia Postprocedure Evaluation (Signed)
Anesthesia Post Note  Patient: Regina James  Procedure(s) Performed: Procedure(s) (LRB): Exploration of Carotid  ENDARTERECTOMY. (Left)     Patient location during evaluation: SICU Anesthesia Type: General Level of consciousness: sedated Pain management: pain level controlled Vital Signs Assessment: post-procedure vital signs reviewed and stable Respiratory status: patient remains intubated per anesthesia plan Cardiovascular status: stable Anesthetic complications: no    Last Vitals:  Vitals:   11/05/16 0600 11/05/16 0700  BP: (!) 176/66 (!) 111/53  Pulse: 64 (!) 50  Resp: 18 13  Temp:      Last Pain:  Vitals:   11/05/16 0406  TempSrc: Oral  PainSc:                  [place]

## 2016-11-05 NOTE — Progress Notes (Signed)
Patient extubated per MD's order, placed on 2LNC, SATS 100%, no stridor heard, pt able to vocalize, will continue to monitor patient.

## 2016-11-05 NOTE — Progress Notes (Addendum)
OT Cancellation Note  Patient Details Name: Regina James MRN: [id number] DOB: 03-28-1934   Cancelled Treatment:    Reason Eval/Treat Not Completed: Patient not medically ready.  Pt on vent with medical decline.  Will check back next week to determine appropriateness to proceed with OT.  [place], OTR/L [phone]   [name] 11/05/2016, 10:32 AM

## 2016-11-05 NOTE — Telephone Encounter (Signed)
Sched appt 11/18/16 at 11:30. Spoke to pt's husband.

## 2016-11-05 NOTE — Progress Notes (Signed)
PULMONARY / CRITICAL CARE MEDICINE   Name: STORMY CONNON MRN: [id number] DOB: [birth year]    ADMISSION DATE:  10/31/2016 CONSULTATION DATE:  11/04/2016  REFERRING MD:  Dr. 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 MRI with scattered small acute infarcts in the left cerebral hemisphere. 8/1 underwent CEA, found to have left carotid hematoma and right sided weakness so taken back to OR for evacuation, where a 5 x 5 cm hematoma was evacuated. Post-operatively Emergent head CT without hemorrhage or large vessel occlusion. Remained intubated. PCCM consulted.   SUBJECTIVE:  Tolerating pressure support Awake  VITAL SIGNS: BP (!) 154/60   Pulse 62   Temp 97.7 F (36.5 C) (Oral)   Resp 16   Ht 5\' 6"  (1.676 m)   Wt 75.6 kg (166 lb 10.7 oz)   SpO2 100%   BMI 26.90 kg/m   HEMODYNAMICS:    VENTILATOR SETTINGS: Vent Mode: CPAP;PSV FiO2 (%):  [40 %] 40 % Set Rate:  [12 bmp-16 bmp] 12 bmp Vt Set:  [480 mL] 480 mL PEEP:  [5 cmH20] 5 cmH20 Pressure Support:  [8 cmH20-10 cmH20] 10 cmH20 Plateau Pressure:  [13 cmH20-15 cmH20] 14 cmH20  INTAKE / OUTPUT: I/O last 3 completed shifts: In: 6776.9 [I.V.:6656.9; NG/GT:120] Out: 2215 [Urine:1735; Emesis/NG output:100; Drains:30; Blood:350]  PHYSICAL EXAMINATION: General: Awake on mechanical ventilation, comfortable Neuro:  Alert, interactive, follows commands, moves all extremities HEENT:  ET tube in place Cardiovascular:  Regular, no murmur Lungs:  Clear bilaterally Abdomen: Soft, positive bowel sounds Musculoskeletal: No deformities Skin: No rash  LABS:  BMET  Recent Labs Lab 11/03/16 0711 11/04/16 0358 11/05/16 0526  NA 141  142 140  K 4.1 3.7 5.8*  CL 109 111 112*  CO2 22 23 22   BUN 14 14 17   CREATININE 0.77 0.79 0.68  GLUCOSE 94 158* 99    Electrolytes  Recent Labs Lab 11/03/16 0711 11/04/16 0358 11/05/16 0526  CALCIUM 8.7* 8.0* 8.0*  MG  --  1.5*  --   PHOS  --  3.9  --     CBC  Recent Labs Lab 11/03/16 0711 11/04/16 0358 11/05/16 0526  WBC 7.3 12.8* 15.2*  HGB 14.7 12.1 11.3*  HCT 43.1 36.0 33.9*  PLT 241 321 280    Coag's  Recent Labs Lab 10/31/16 1055 11/02/16 0658  APTT 39*  --   INR 1.10 1.06    Sepsis Markers No results for input(s): LATICACIDVEN, PROCALCITON, O2SATVEN in the last 168 hours.  ABG  Recent Labs Lab 11/04/16 0135  PHART 7.418  PCO2ART 35.6  PO2ART 231*    Liver Enzymes  Recent Labs Lab 10/31/16 1055  AST 37  ALT 38  ALKPHOS 106  BILITOT 0.8  ALBUMIN 3.5    Cardiac Enzymes No results for input(s): TROPONINI, PROBNP in the last 168 hours.  Glucose  Recent Labs Lab 11/04/16 0901 11/04/16 1214 11/04/16 1545 11/04/16 1928 11/04/16 2333 11/05/16 0350  GLUCAP 139* 129* 119* 98 87 101*    Imaging No results found.   STUDIES:  CT Head 7/29 > Negative for bleed, negative acute process, patchy left frontal parietal white matter hypo-intensities  MR Brain 7/29 > Scattered small acute infarcts in  the left cerebral hemisphere , greatest in the posterior frontal lobe near the vertex, mild chronic small vessel ischemic disease  CTA Head/Neck 7/30 > negative for large vessel occlusion, mild progression of bulky soft plaque at the left ICA, with 60% proximal left ICA stenosis CTA Head/Neck 8/2 > Negative for large vessel occlusion, high-grade stenosis of mid left subclavian artery, small vessel atheromatous irregularity involving the distal left MCA and left greater than right PCA branches bilaterally MR Brain 8/2 >>Several new small acute embolic infarcts in the left cerebral hemisphere, predominantly posterior frontal  lobe   CULTURES: MRSA by PCR 8/1 > Negative  Staphylococcus 8/1 > Negative   ANTIBIOTICS: Zinacef 8/2 >> 8/3  SIGNIFICANT EVENTS: 7/29 > ED > scattered emboli in left hemisphere  8/1 > CEA > post-operatively right side weakness > CTA negative   LINES/TUBES: ETT 8/1 >>  JP drain neck 8/1 >> 8/2  DISCUSSION: 83 year old female presents to ED on 7/29 with right leg weakness, found to have scattered emboli in left hemisphere. 8/1 underwent CEA post-operatively had new right side weakness > CT negative for occlusion or bleed. Remained intubated.   ASSESSMENT / PLAN:  PULMONARY A: Respiratory Insufficiency in post-operative setting  P:   Plan for extubation today Speech therapy evaluation for swallowing in the aftermath of apparent acute stroke  CARDIOVASCULAR A:  Hypotension secondary to sedation  Internal Carotid Artery Stenosis  Distal Aortic Arch Ulcer - 4.6 cm ascending aortic aneurysm  H/O HTN and Dyslipidemia  P:  Plans as outlined by vascular surgery and cardiology Restart antihypertensive medications when able to take by mouth Plavix and aspirin as ordered  RENAL A:   Hyperkalemia P:   Recheck BMP now, treat hyperkalemia if indicated Follow BMP and urine output  GASTROINTESTINAL A:   Dysphagia  P:   Formal speech therapy evaluation for swallowing  HEMATOLOGIC A:   Anticoagulation needs  P:  SCD, Plavix, aspirin Follow CBC  INFECTIOUS A:   No issues  P:   Zinacef surgical prophylaxis completed Follow clinically, WBC  ENDOCRINE A:   No issues    P:    NEUROLOGIC A:   New Right side weakness post-op  Scattered CVC in left hemisphere  H/O Left MCA CVA  P:   RASS goal: 0 Management CVA as per neurology recommendations Aspirin, Plavix  FAMILY  - Updates: Family updated at bedside on 8/3  - Inter-disciplinary family meet or Palliative Care meeting due by:  11/11/2016  Independent CC time 24 minutes  Baltazar Apo, MD, PhD 11/05/2016,  11:01 AM [facility] [phone] or if no answer [phone]

## 2016-11-05 NOTE — Telephone Encounter (Signed)
-----   Message from Mena Goes, RN sent at 11/04/2016  8:53 AM EDT ----- Regarding: 2 weeks   ----- Message ----- From: Gabriel Earing, PA-C Sent: 11/03/2016   7:30 PM To: Vvs Charge Pool  S/p left CEA 11/03/16.  F/u with Dr. Oneida Alar in 2 weeks.  Thanks

## 2016-11-05 NOTE — Progress Notes (Signed)
PULMONARY / CRITICAL CARE MEDICINE   Name: Regina James MRN: [id number] DOB: 1933/05/26    ADMISSION DATE:  10/31/2016 CONSULTATION DATE:  11/04/2016  REFERRING MD:  Dr. 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 MRI with scattered small acute infarcts in the left cerebral hemisphere. 8/1 underwent CEA, found to have left carotid hematoma and right sided weakness so taken back to OR for evacuation, where a 5 x 5 cm hematoma was evacuated. Post-operatively Emergent head CT without hemorrhage or large vessel occlusion. Remained intubated. PCCM consulted.   SUBJECTIVE:  Weaning on PSV this am, awake, wants ETT out  VITAL SIGNS: BP (!) 154/60   Pulse 62   Temp 97.7 F (36.5 C) (Oral)   Resp 16   Ht 5\' 6"  (1.676 m)   Wt 166 lb 10.7 oz (75.6 kg)   SpO2 100%   BMI 26.90 kg/m   HEMODYNAMICS:    VENTILATOR SETTINGS: Vent Mode: CPAP;PSV FiO2 (%):  [40 %] 40 % Set Rate:  [12 bmp-16 bmp] 12 bmp Vt Set:  [480 mL] 480 mL PEEP:  [5 cmH20] 5 cmH20 Pressure Support:  [8 cmH20-10 cmH20] 10 cmH20 Plateau Pressure:  [13 cmH20-15 cmH20] 14 cmH20  INTAKE / OUTPUT: I/O last 3 completed shifts: In: 6776.9 [I.V.:6656.9; NG/GT:120] Out: 2215 [Urine:1735; Emesis/NG output:100; Drains:30; Blood:350]  PHYSICAL EXAMINATION: General:  Elderly female sitting up in bed, fully alert, NAD HEENT: MM pink/moist, PERRL, ETT/OGT, left neck incision soft Neuro:  Awake, follows commands, L normal, RUE/RLE 2/5 CV: rrr PULM: even/non-labored on PSV 5/5, lungs bilaterally clear GI: soft, non-tender, bs active  Extremities: warm/dry, no edema  Skin: no rashes or lesions   LABS:  BMET  Recent  Labs Lab 11/03/16 0711 11/04/16 0358 11/05/16 0526  NA 141 142 140  K 4.1 3.7 5.8*  CL 109 111 112*  CO2 22 23 22   BUN 14 14 17   CREATININE 0.77 0.79 0.68  GLUCOSE 94 158* 99    Electrolytes  Recent Labs Lab 11/03/16 0711 11/04/16 0358 11/05/16 0526  CALCIUM 8.7* 8.0* 8.0*  MG  --  1.5*  --   PHOS  --  3.9  --     CBC  Recent Labs Lab 11/03/16 0711 11/04/16 0358 11/05/16 0526  WBC 7.3 12.8* 15.2*  HGB 14.7 12.1 11.3*  HCT 43.1 36.0 33.9*  PLT 241 321 280    Coag's  Recent Labs Lab 10/31/16 1055 11/02/16 0658  APTT 39*  --   INR 1.10 1.06    Sepsis Markers No results for input(s): LATICACIDVEN, PROCALCITON, O2SATVEN in the last 168 hours.  ABG  Recent Labs Lab 11/04/16 0135  PHART 7.418  PCO2ART 35.6  PO2ART 231*    Liver Enzymes  Recent Labs Lab 10/31/16 1055  AST 37  ALT 38  ALKPHOS 106  BILITOT 0.8  ALBUMIN 3.5    Cardiac Enzymes No results for input(s): TROPONINI, PROBNP in the last 168 hours.  Glucose  Recent Labs Lab 11/04/16 0901 11/04/16 1214 11/04/16 1545 11/04/16 1928 11/04/16 2333 11/05/16 0350  GLUCAP 139* 129* 119* 98 87 101*    Imaging No results found.   STUDIES:  CT Head 7/29 > Negative for bleed,  negative acute process, patchy left frontal parietal white matter hypo-intensities  MR Brain 7/29 > Scattered small acute infarcts in the left cerebral hemisphere , greatest in the posterior frontal lobe near the vertex, mild chronic small vessel ischemic disease  CTA Head/Neck 7/30 > negative for large vessel occlusion, mild progression of bulky soft plaque at the left ICA, with 60% proximal left ICA stenosis 7/31 TEE >> LVEF 55-60, Normal LV function; LVH; mildly dilated ascending aorta; trace AI; mild to moderate MR; mild TR; atrial septal aneurysm; negative saline microcavitation study CTA Head/Neck 8/2 > Negative for large vessel occlusion, high-grade stenosis of mid left subclavian artery, small vessel  atheromatous irregularity involving the distal left MCA and left greater than right PCA branches bilaterally MR Brain 8/2 >> Several new small acute embolic infarcts in the left cerebral hemisphere, predominantly in the posterior frontal lobe near the vertex   CULTURES: MRSA by PCR 8/1 > Negative  Staphylococcus 8/1 > Negative   ANTIBIOTICS: Zinacef 8/2 >>   SIGNIFICANT EVENTS: 7/29 > ED > scattered emboli in left hemisphere  8/1 > CEA > post-operatively right side weakness > CTA negative    LINES/TUBES: ETT 8/1 >> 8/3 JP drain neck 8/1 >> 8/2  DISCUSSION: 83 year old female presents to ED on 7/29 with right leg weakness, found to have scattered emboli in left hemisphere. 8/1 underwent CEA post-operatively had new right side weakness > CT negative for occlusion or bleed. Remained intubated.   ASSESSMENT / PLAN:  PULMONARY A: Respiratory Insufficiency in post-operative setting - resolved P:   Sucessful SBT- extubate now Wean O2 for sats > 94% Pulmonary hygiene with IS, mobilize as able/per VSS/neuro SLP eval  CARDIOVASCULAR A:  Hypotension secondary to sedation  Internal Carotid Artery Stenosis  Distal Aortic Arch Ulcer - 4.6 cm ascending aortic aneurysm  H/O HTN and Dyslipidemia  -TEE noted from 7/31 P:  Tele monitoring Hold home lopressor Apresoline prn for SBP > 180, some permissive HTN ok per neuro Continue Plavix, ASA, and pravastatin  RENAL A:   No issues  - K 5.8 hemolysis on am labs, no EKG changes- difficult IV stick  P:   KVO IVF Follow BMP and UOP  GASTROINTESTINAL A:   Dysphagia  P:   NPO  SLP to determine aspiration risk/diet  HEMATOLOGIC A:   Anticoagulation needs  P:  scd Follow CBC  INFECTIOUS A:   Slight leukocytosis -afebrile  P:   Follow Clinically, WBC  ENDOCRINE A:   No issues    P:   SSI cbg q 4  NEUROLOGIC A:   New Right side weakness post-op  Scattered CVC in left hemisphere  H/O Left MCA CVA  - post-op CTA  head/neck with patent left ICA - MRI with scattered left MCA infarcts with residual left hemiparesis  P:   Appreciate Neurology assistance Final read MRI pending PT/OT eval  FAMILY  - Updates: daughter at bedside updated.   - Inter-disciplinary family meet or Palliative Care meeting due by:  11/11/2016  CCT 30 mins  Kennieth Rad, AGACNP-BC  Pulmonary & Critical Care Pgr: [phone] or if no answer [phone] 11/05/2016, 11:01 AM

## 2016-11-05 NOTE — Progress Notes (Signed)
  Progress Note  SUBJECTIVE:    POD #2  Awake on vent. Following commands.   OBJECTIVE:   Vitals:   11/05/16 0726 11/05/16 0811  BP: (!) 123/51 (!) 147/60  Pulse: (!) 50 (!) 57  Resp: 13 18  Temp: 97.7 F (36.5 C)     Intake/Output Summary (Last 24 hours) at 11/05/16 0859 Last data filed at 11/05/16 0700  Gross per 24 hour  Intake          2635.22 ml  Output              895 ml  Net          1740.22 ml   Intubated. Follows commands.  Left neck soft without hematoma. Strength symmetric upper and lower extremities.   ASSESSMENT/PLAN:   83 y.o. female is s/p: left carotid endarterectomy, evacuation hematoma left neck, post-op stroke, MRI with scattered left MCA infarcts.  2 Days Post-Op   Moving all extremities fairly equally this am. Hopefully can extubate today.  Appreciate CCM and neurology assistance with this patient.   Alvia Grove 11/05/2016 8:59 AM   Has made remarkable recovery overnight.  Right side near baseline No neck hematoma Extubate today PT OT  Ruta Hinds, MD [facility] Office: [phone] Pager: [phone]  -- LABS:   CBC    Component Value Date/Time   WBC 15.2 (H) 11/05/2016 0526   HGB 11.3 (L) 11/05/2016 0526   HCT 33.9 (L) 11/05/2016 0526   PLT 280 11/05/2016 0526    BMET    Component Value Date/Time   NA 140 11/05/2016 0526   K 5.8 (H) 11/05/2016 0526   CL 112 (H) 11/05/2016 0526   CO2 22 11/05/2016 0526   GLUCOSE 99 11/05/2016 0526   BUN 17 11/05/2016 0526   CREATININE 0.68 11/05/2016 0526   CALCIUM 8.0 (L) 11/05/2016 0526   GFRNONAA >60 11/05/2016 0526   GFRAA >60 11/05/2016 0526    COAG Lab Results  Component Value Date   INR 1.06 11/02/2016   INR 1.10 10/31/2016   INR 1.04 06/27/2016   No results found for: PTT  ANTIBIOTICS:   Anti-infectives    Start     Dose/Rate Route Frequency Ordered Stop   11/04/16 0600  cefUROXime (ZINACEF) 1.5 g in dextrose 5 % 50 mL  IVPB     1.5 g 100 mL/hr over 30 Minutes Intravenous On call to O.R. 11/03/16 1709 11/03/16 1719   11/04/16 0400  cefUROXime (ZINACEF) 1.5 g in dextrose 5 % 50 mL IVPB     1.5 g 100 mL/hr over 30 Minutes Intravenous Every 12 hours 11/04/16 0001 11/04/16 1700   11/03/16 0400  ceFAZolin (ANCEF) IVPB 1 g/50 mL premix  Status:  Discontinued    Comments:  Send with pt to OR   1 g 100 mL/hr over 30 Minutes Intravenous On call 11/02/16 1328 11/03/16 2340   11/02/16 1111  cefUROXime (ZINACEF) 1.5 g in dextrose 5 % 50 mL IVPB     1.5 g 100 mL/hr over 30 Minutes Intravenous On call to O.R. 11/02/16 1110 11/03/16 [place], PA-C [facility] Office: [phone] Pager: [phone] 11/05/2016 8:59 AM

## 2016-11-06 ENCOUNTER — Inpatient Hospital Stay (HOSPITAL_COMMUNITY): Payer: Medicare Other

## 2016-11-06 LAB — GLUCOSE, CAPILLARY
Glucose-Capillary: 97 mg/dL (ref 65–99)
Glucose-Capillary: 67 mg/dL (ref 65–99)
Glucose-Capillary: 64 mg/dL — ABNORMAL LOW (ref 65–99)
Glucose-Capillary: 183 mg/dL — ABNORMAL HIGH (ref 65–99)
Glucose-Capillary: 103 mg/dL — ABNORMAL HIGH (ref 65–99)
Glucose-Capillary: 120 mg/dL — ABNORMAL HIGH (ref 65–99)
Glucose-Capillary: 157 mg/dL — ABNORMAL HIGH (ref 65–99)
Glucose-Capillary: 108 mg/dL — ABNORMAL HIGH (ref 65–99)

## 2016-11-06 LAB — CBC
HCT: 33.3 % — ABNORMAL LOW (ref 36.0–46.0)
Hemoglobin: 11.1 g/dL — ABNORMAL LOW (ref 12.0–15.0)
MCH: 31.5 pg (ref 26.0–34.0)
MCHC: 33.3 g/dL (ref 30.0–36.0)
MCV: 94.6 fL (ref 78.0–100.0)
Platelets: 294 10*3/uL (ref 150–400)
RBC: 3.52 MIL/uL — ABNORMAL LOW (ref 3.87–5.11)
RDW: 15.2 % (ref 11.5–15.5)
WBC: 12 10*3/uL — ABNORMAL HIGH (ref 4.0–10.5)

## 2016-11-06 LAB — BASIC METABOLIC PANEL
Anion gap: 7 (ref 5–15)
BUN: 10 mg/dL (ref 6–20)
CO2: 23 mmol/L (ref 22–32)
Calcium: 8.2 mg/dL — ABNORMAL LOW (ref 8.9–10.3)
Chloride: 108 mmol/L (ref 101–111)
Creatinine, Ser: 0.55 mg/dL (ref 0.44–1.00)
GFR calc Af Amer: >60 mL/min (ref >60)
GFR calc non Af Amer: >60 mL/min (ref >60)
Glucose, Bld: 99 mg/dL (ref 65–99)
Potassium: 3.6 mmol/L (ref 3.5–5.1)
Sodium: 138 mmol/L (ref 135–145)

## 2016-11-06 MED ORDER — DEXTROSE 50 % IV SOLN
INTRAVENOUS | Status: AC
Start: 2016-11-06 — End: 2016-11-06
  Administered 2016-11-06: 25 mL
  Filled 2016-11-06: qty 50

## 2016-11-06 MED ORDER — LORAZEPAM 2 MG/ML IJ SOLN
INTRAMUSCULAR | Status: AC
  Filled 2016-11-06: qty 1

## 2016-11-06 MED ORDER — MAGNESIUM SULFATE 50 % IJ SOLN
1.0000 g | Freq: Once | INTRAMUSCULAR | Status: DC
  Filled 2016-11-06: qty 2

## 2016-11-06 MED ORDER — DEXTROSE 50 % IV SOLN
INTRAVENOUS | Status: AC
  Administered 2016-11-06: 50 mL
  Filled 2016-11-06: qty 50

## 2016-11-06 MED ORDER — LORAZEPAM 2 MG/ML IJ SOLN
1.0000 mg | Freq: Four times a day (QID) | INTRAMUSCULAR | Status: DC | PRN
  Administered 2016-11-06: 1 mg via INTRAVENOUS

## 2016-11-06 MED ORDER — POTASSIUM CHLORIDE CRYS ER 20 MEQ PO TBCR
20.0000 meq | EXTENDED_RELEASE_TABLET | Freq: Two times a day (BID) | ORAL | Status: AC
  Administered 2016-11-06 – 2016-11-07 (×4): 20 meq via ORAL
  Filled 2016-11-06 (×4): qty 1

## 2016-11-06 MED ORDER — METOPROLOL SUCCINATE ER 50 MG PO TB24
50.0000 mg | ORAL_TABLET | Freq: Every day | ORAL | Status: DC
  Administered 2016-11-06 – 2016-11-10 (×5): 50 mg via ORAL
  Filled 2016-11-06 (×5): qty 1

## 2016-11-06 MED ORDER — ORAL CARE MOUTH RINSE
15.0000 mL | Freq: Two times a day (BID) | OROMUCOSAL | Status: DC
  Administered 2016-11-06 – 2016-11-10 (×6): 15 mL via OROMUCOSAL

## 2016-11-06 MED ORDER — MAGNESIUM SULFATE IN D5W 1-5 GM/100ML-% IV SOLN
1.0000 g | Freq: Once | INTRAVENOUS | Status: AC
  Administered 2016-11-06: 1 g via INTRAVENOUS
  Filled 2016-11-06: qty 100

## 2016-11-06 MED ORDER — STARCH (THICKENING) PO POWD
ORAL | Status: DC | PRN
  Filled 2016-11-06: qty 227

## 2016-11-06 NOTE — Progress Notes (Signed)
STROKE TEAM PROGRESS NOTE  History on Admission Regina James is an 83 y.o. female with a past medical history significant for CVA in March, 2018 with residual proximal RUE weakness, HTN, tobacco abuse, tachyarrhythmia that does not appear to be afib (per cards workup in March) presents with right sided weakness. She states that she woke up around 0400 by a severe right leg cramp and persistent right leg weakness following resolution of the cramp. She states that the RUE weakness has worsened. She denies word trouble speaking and focal neurological deficit otherwise. Regina James states acknowledges that she does not take plavix as prescribed.   Pertinent Imaging: CT head 10/31/16: Patchy left frontal parietal white matter hypointensities more conspicuous than on prior study. MRI 10/31/16: pending formal read CTA head/neck 06/27/16:No LVO. Hypoplastic right vertebral artery. Atheromatous plaque up to 30% on the right and 40-50% on the left carotid bifurcations. Superimposed penetrating atheromatous ulcer at the aortic arch. Narrowing of approximately 40% at the origin of the great vessels. Severe 80% left subclavian artery stenosis as above. MR brain 3/25: Widely scattered but small, mostly cortically based, acute infarcts in the posterior Left MCA and the superior Left PCA territories. Otherwise stable since 2007.  Date last known well: Date: 10/30/2016 Time last known well: Time: 23:00  tPA Given: No: Out of window, no cortical signs   SUBJECTIVE (INTERVAL HISTORY) S/P extubation.   She is awake and alert.  OBJECTIVE Temp:  [97.8 F (36.6 C)-99.1 F (37.3 C)] 99.1 F (37.3 C) (08/04 0352) Pulse Rate:  [53-83] 74 (08/03 2011) Cardiac Rhythm: Normal sinus rhythm (08/03 1950) Resp:  [11-22] 19 (08/03 2011) BP: (147-175)/(60-81) 156/77 (08/03 2011) SpO2:  [99 %-100 %] 99 % (08/03 2011) FiO2 (%):  [40 %] 40 % (08/03 0811)  CBC:   Recent Labs Lab 10/31/16 1055  11/01/16 0341   11/05/16 0526 11/06/16 0307  WBC 9.6  --  7.0  < > 15.2* 12.0*  NEUTROABS 6.3  --  3.6  --   --   --   HGB 14.1  < > 13.0  < > 11.3* 11.1*  HCT 42.0  < > 38.5  < > 33.9* 33.3*  MCV 93.3  --  93.2  < > 97.7 94.6  PLT 277  --  262  < > 280 294  < > = values in this interval not displayed.  Basic Metabolic Panel:   Recent Labs Lab 11/04/16 0358  11/05/16 1333 11/06/16 0307  NA 142  < > 139 138  K 3.7  < > 3.7 3.6  CL 111  < > 111 108  CO2 23  < > 24 23  GLUCOSE 158*  < > 111* 99  BUN 14  < > 15 10  CREATININE 0.79  < > 0.53 0.55  CALCIUM 8.0*  < > 8.2* 8.2*  MG 1.5*  --   --   --   PHOS 3.9  --   --   --   < > = values in this interval not displayed.  Lipid Panel:     Component Value Date/Time   CHOL 147 11/01/2016 0341   TRIG 71 11/04/2016 0358   HDL 42 11/01/2016 0341   CHOLHDL 3.5 11/01/2016 0341   VLDL 16 11/01/2016 0341   LDLCALC 89 11/01/2016 0341   HgbA1c:  Lab Results  Component Value Date   HGBA1C 5.7 (H) 11/01/2016   Urine Drug Screen:     Component Value Date/Time  LABOPIA NONE DETECTED 10/31/2016 1541   COCAINSCRNUR NONE DETECTED 10/31/2016 1541   LABBENZ NONE DETECTED 10/31/2016 1541   AMPHETMU NONE DETECTED 10/31/2016 1541   THCU NONE DETECTED 10/31/2016 1541   LABBARB NONE DETECTED 10/31/2016 1541    Alcohol Level     Component Value Date/Time   ETH <5 10/31/2016 1055    IMAGING I have personally reviewed the radiological images below and agree with the radiology interpretations.  Ct Head Wo Contrast 10/31/2016 1. Negative for bleed or other acute process.  2. Patchy left frontal parietal white matter hypointensities more conspicuous than on prior study.   Ct Angio Head W Or Wo Contrast Ct Angio Neck W Or Wo Contrast 11/01/2016 1. Penetrating Aortic Ulcer (PAU) with progression since March, now measuring up to 14 mm. See series 9, image 171 and series 12, image 127.  2. Negative for large vessel occlusion. Mild progression of bulky  soft plaque at the left ICA origin since March, with  60% proximal Left ICA stenosis. No other large vessel stenosis.  3. Mild to moderate atherosclerotic irregularity of the left MCA and left greater than right PCA branches.  4. Continued stable non contrast CT appearance of the brain.     Mr Brain Limited Wo Contrast 10/31/2016 1. Scattered small acute infarcts in the left cerebral hemisphere, greatest in the posterior frontal lobe near the vertex.  2. Mild chronic small vessel ischemic disease.   2D Echo 06/28/2016 Study Conclusions - Left ventricle: The cavity size was normal. There was mild concentric hypertrophy. Systolic function was normal. The estimated ejection fraction was in the range of 55% to 60%. Wall motion was normal; there were no regional wall motion abnormalities. Doppler parameters are consistent with abnormal left ventricular relaxation (grade 1 diastolic dysfunction).  Doppler parameters are consistent with high ventricular filling pressure. - Aortic valve: Transvalvular velocity was within the normal range.  There was no stenosis. There was no regurgitation. - Mitral valve: Transvalvular velocity was within the normal range.  There was no evidence for stenosis. There was no regurgitation. - Left atrium: The atrium was moderately dilated. - Right ventricle: The cavity size was normal. Wall thickness was normal. Systolic function was normal. - Atrial septum: No defect or patent foramen ovale was identified. - Tricuspid valve: There was mild regurgitation. - Pulmonary arteries: Systolic pressure was within the normal   range. PA peak pressure: 30 mm Hg (S).   VAS [place] LE 06/28/2016 Summary: - No evidence of deep vein or superficial thrombosis involving the right lower extremity and left lower extremity.  TEE Normal LV function; no LAA thrombus; atrial septal aneurysm; negative saline microcavitation study.   Ct Angio Head and neck W Or Wo Contrast 11/04/2016 1.  Negative CTA for emergent large vessel occlusion.  2. Postoperative changes from recent left carotid endarterectomy without complication. No significant residual stenosis.  3. High-grade approximate 80% stenosis involving the mid left subclavian artery.  4. No other high-grade or critical flow limiting stenosis.  5. Penetrating aortic ulcer or, measuring 15 mm on today's exam, previously 14 mm on 11/01/2016.  6. Small vessel atheromatous irregularity involving the distal left MCA and left greater than right PCA branches bilaterally.    Mr Brain Wo Contrast 11/04/2016 Several new small acute embolic infarcts in the left cerebral hemisphere, predominantly in the posterior frontal lobe near the vertex.    PHYSICAL EXAM  Temp:  [97.8 F (36.6 C)-99.1 F (37.3 C)] 99.1 F (37.3 C) (08/04 0352)  Pulse Rate:  [53-83] 74 (08/03 2011) Resp:  [11-22] 19 (08/03 2011) BP: (147-175)/(60-81) 156/77 (08/03 2011) SpO2:  [99 %-100 %] 99 % (08/03 2011) FiO2 (%):  [40 %] 40 % (08/03 0811)  General - Well nourished, well developed, intubated on low dose sedation.  Ophthalmologic - disc not visualized due to noncooperation   Cardiovascular - Regular rate and rhythm.  Neuro - intubated on low dose fetanyl, eyes open, following simple commands. Eyes moving to both directions, PERRL, EOMI, blinking to visual threat bilaterally, facial symmetry and tongue protrusion not able to check due to ET tube. LUE and LLE 5/5. RUE 3/5 and RLE 4-/5 proximal and 2+/5 distally. DTR 1+ and no babinski. Sensation, coordination and gait not tested.   ASSESSMENT/PLAN Regina James is a 83 y.o. female with history of cancer, history PSVT, palpitations, tobacco abuse, CVA in March, 2018 with residual proximal RUE weakness, HTN, who presented with right sided weakness. She did not receive IV t-PA due to arriving outside of the treatment window/presenting without cortical signs.  Stroke:   1.Scattered small acute infarcts  in the left MCA and ACA, likely embolic, due to left ICA soft plaque with stenosis vs. cardioembolic with PSVT and suspicious for afib. 2. Recurrent scattered left MCA infarct s/p left CEA with neck hematoma after evacuation - likely related to procedure complication of neck hematoma  Resultant  Right hemiparesis  CT head: Patchy left frontal parietal white matter hypointensities more conspicuous than on prior study.  CTA: increased left proximal ICA soft plaque and stenosis now at 60%. No LVO or high-grade stenosis.  14 mm penetrating aortic ulcer.  MRI head 10/31/16: Scattered small acute infarcts in the left cerebral hemisphere, greatest in the posterior frontal lobe near the vertex.   MRI head 11/04/16: Several new small acute embolic infarcts in the left cerebral hemisphere, predominantly in the posterior frontal lobe near the vertex.  2D Echo (06/28/2016): EF 60-65%. No source of embolus.   TEE unremarkable   Loop recorder placed  LDL 89   HgbA1c 5.7  SCDs for VTE prophylaxis  clopidogrel 75 mg daily prior to admission, now on aspirin 325 mg daily and clopidogrel 75 mg daily. Continue DAPT for 3 months and then plavix alone.  Patient counseled to be compliant with her antithrombotic medications  Ongoing aggressive stroke risk factor management  Therapy recommendations:  pending  Disposition:  Pending  Left ICA stenosis with soft plaque s/p CEA  Could be the reason for recurrent left MCA / ACA infarcts  Left CEA on 11/03/16 - evacuation hematoma left carotid, exploration left carotid artery - Dr Oneida Alar  Continue DAPT  Aortic ulceration   Distal to the left CCA take off - unlikely the cause of her stroke  Recommend vascular surgery or CVST consultation for further monitoring and management  Hx of stroke  06/2016 right hand weakness - CTA head negative - CTA head left ICA 40-50% stenosis, aortic ulceration - MRI scattered left MCA infarcts - LDL 95 and A1C 5.6 - changed  ASA to plavix and add lipitor - recommend outpt TEE and loop  08/2016 followed up in [place] with Dr. Leta Baptist - arranged TEE and loop but pt did not answer the arrangement with cardiology  Pt also noncompliant with plavix and lipitor  Hypertension  Unstable  Permissive hypertension (OK if < 220/120) but gradually normalize in 5-7 days  Long-term BP goal normotensive  156/71  Hyperlipidemia  Home meds: atorvastatin 20mg , not taking at  home  LDL 89, goal < 70  Will change to pravastatin 20mg  for compliance  Continue statin at discharge   Tobacco abuse  Current smoker  Smoking cessation counseling provided  Nicotine patch provided  Pt is willing to quit  Other Stroke Risk Factors  Advanced age  ETOH use, advised to drink no more than 1 drink(s) a day   Other Active Problems  Extubated yesterday Friday  NPO per speech evaluation today Saturday  Mild anemia  Mild leukocytosis (afebrile) (currently on no antibiotics)   PLAN  Transfer to floor  PT / [facility] day # 6   Awake, alert, fully oriented. Face symmetric.  Tongue midline. Left carotid incision c/d/intact RUE 4/5 grip but 2/5 otherwise. RLE 2-3/5.   LUE and LLE 5/5  Left frontal multiple small embolic infarcts probably due to left carotid stenosis and plaque s/p left CEA.  Cardioembolic infarcts cannot be completely ruled out.   TEE and telemetry are negative.  Dual antiplatelet therapy for 3 months then monotherapy.  There is a theoretical risk of increased hemorrhage with the aortic findings.  I would like the vascular surgeon to comment on that.  Awaiting ICLR findings for ?afib.    Rogue Jury, MS, MD   This patient is critically ill due to left MCA infarct s/p left CEA, respiratory failure, left neck hematoma s/p evacuation, right hemiparesis and at significant risk of neurological worsening, death form recurrent stroke, hemorrhagic conversion, seizure, bleeding. This patient's care  requires constant monitoring of vital signs, hemodynamics, respiratory and cardiac monitoring, review of multiple databases, neurological assessment, discussion with family, other specialists and medical decision making of high complexity. I had long discussion with daughter and pt at bedside, updated pt current condition, treatment plan and potential prognosis. They expressed understanding and appreciation. I spent 40 minutes of neurocritical care time in the care of this patient.     To contact Stroke Continuity provider, please refer to [email or web address] After hours, contact General Neurology

## 2016-11-06 NOTE — Progress Notes (Signed)
Dr.Dickson made aware of HR intermittently up to the 160s. EKG done showing ST with PVCs and fusion complexes. Frequent PACs noted on bedside monitor as well. BP remains stable with systolics in the 670L. Patient asymptomatic and states she normally takes 50mg  Metoprolol at home and hasn't been taking it here. Order given to resume home metoprolol. Dr.Dickson to review chart and enter any additional orders. Daughter at bedside and aware of circumstances. Continuing to closely monitor patient at this time.   Regina James

## 2016-11-06 NOTE — Progress Notes (Signed)
   VASCULAR SURGERY ASSESSMENT & PLAN:   3 Days Post-Op s/p: Left CEA . Preop and periop CVA  PULMONARY: Extubated yesterday and doing well from this standpoint.  Needs PT/OT  Transfer to 4E  Is on aspirin and Plavix.  SUBJECTIVE:   Was anxious last night but is better this morning.  PHYSICAL EXAM:   Vitals:   11/05/16 1900 11/05/16 2011 11/05/16 2339 11/06/16 0352  BP: (!) 156/81 (!) 156/77    Pulse: 83 74    Resp: 20 19    Temp:  98.8 F (37.1 C) 98.7 F (37.1 C) 99.1 F (37.3 C)  TempSrc:  Oral Oral   SpO2: 99% 99%    Weight:      Height:       Weakness in right upper extremity and right lower extremity. Left neck incision looks fine. The drain is out.  LABS:   Lab Results  Component Value Date   WBC 12.0 (H) 11/06/2016   HGB 11.1 (L) 11/06/2016   HCT 33.3 (L) 11/06/2016   MCV 94.6 11/06/2016   PLT 294 11/06/2016   Lab Results  Component Value Date   CREATININE 0.55 11/06/2016   Lab Results  Component Value Date   INR 1.06 11/02/2016   CBG (last 3)   Recent Labs  11/05/16 1549 11/05/16 2337 11/06/16 0350  GLUCAP 109* 84 97    PROBLEM LIST:    Principal Problem:   Stroke ([place]) Active Problems:   Mixed dyslipidemia   Essential hypertension   Tachyarrhythmia   Internal carotid artery stenosis, left   Aortic arch aneurysm (H[facility])   CURRENT MEDS:   . aspirin  325 mg Per Tube Daily  . chlorhexidine gluconate (MEDLINE KIT)  15 mL Mouth Rinse BID  . clopidogrel  75 mg Per Tube Daily  . docusate  100 mg Per Tube Daily  . insulin aspart  0-9 Units Subcutaneous Q4H  . mouth rinse  15 mL Mouth Rinse QID  . multivitamin  15 mL Oral Daily  . nicotine  14 mg Transdermal Daily  . pantoprazole  40 mg Oral Daily  . pravastatin  20 mg Oral q1800    Gae Gallop Beeper: [phone] Office: [phone] 11/06/2016

## 2016-11-06 NOTE — Plan of Care (Signed)
Problem: Self-Care: Goal: Ability to participate in self-care as condition permits will improve Outcome: Progressing Pt more willing to do things for herself and try to adapt to right sided weakness Goal: Verbalization of feelings and concerns over difficulty with self-care will improve Outcome: Progressing Voices frustration over changes in functioning of right side, but hopefulness that her strength on that side will improve Goal: Ability to communicate needs accurately will improve Outcome: Progressing Able to appropriately voice concerns and need for help  Problem: Nutrition: Goal: Risk of aspiration will decrease Outcome: Progressing MBS performed and appropriate diet prescribed. Patient and family aware and understanding of reasoning for specialized diet and thickened liquids. Goal: Dietary intake will improve Outcome: Progressing Adequately taking POs now that MBS has been completed and diet prescribed  Comments: Vena Austria

## 2016-11-06 NOTE — Progress Notes (Signed)
PULMONARY / CRITICAL CARE MEDICINE   Name: MOHINI HEATHCOCK MRN: [id number] DOB: Jul 07, 1933    ADMISSION DATE:  10/31/2016 CONSULTATION DATE:  11/04/2016  REFERRING MD:  Dr. 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 MRI with scattered small acute infarcts in the left cerebral hemisphere. 8/1 underwent CEA, found to have left carotid hematoma and right sided weakness so taken back to OR for evacuation, where a 5 x 5 cm hematoma was evacuated. Post-operatively Emergent head CT without hemorrhage or large vessel occlusion. Remained intubated. PCCM consulted.   SUBJECTIVE:  Extubated successfully  VITAL SIGNS: BP (!) 170/80 (BP Location: Right Arm)   Pulse (!) 102   Temp 98.9 F (37.2 C) (Oral)   Resp 20   Ht 5\' 6"  (1.676 m)   Wt 75.6 kg (166 lb 10.7 oz)   SpO2 95%   BMI 26.90 kg/m   HEMODYNAMICS:    VENTILATOR SETTINGS:    INTAKE / OUTPUT: I/O last 3 completed shifts: In: 1921.4 [I.V.:1446.4; Other:475] Out: 24 [Urine:310; Emesis/NG output:100]  PHYSICAL EXAMINATION: General: Awake, comfortable on room air Neuro:  Alert, interacting, follows commands, appropriate. Good strength on the left. 3-4+/5 on RUE, LE HEENT:  Oropharynx clear, no stridor, slight hoarse voice Cardiovascular: Regular, no murmur Lungs: There are bilaterally Abdomen: Soft, positive bowel sounds Musculoskeletal: No deformity Skin: No rash  LABS:  BMET  Recent Labs Lab 11/05/16 0526 11/05/16 1333 11/06/16 0307  NA 140 139 138  K 5.8* 3.7 3.6  CL 112* 111 108  CO2 22 24 23   BUN 17 15 10   CREATININE 0.68 0.53 0.55  GLUCOSE 99 111* 99    Electrolytes  Recent Labs Lab 11/04/16 0358  11/05/16 0526 11/05/16 1333 11/06/16 0307  CALCIUM 8.0* 8.0* 8.2* 8.2*  MG 1.5*  --   --   --   PHOS 3.9  --   --   --     CBC  Recent Labs Lab 11/04/16 0358 11/05/16 0526 11/06/16 0307  WBC 12.8* 15.2* 12.0*  HGB 12.1 11.3* 11.1*  HCT 36.0 33.9* 33.3*  PLT 321 280 294    Coag's  Recent Labs Lab 10/31/16 1055 11/02/16 0658  APTT 39*  --   INR 1.10 1.06    Sepsis Markers No results for input(s): LATICACIDVEN, PROCALCITON, O2SATVEN in the last 168 hours.  ABG  Recent Labs Lab 11/04/16 0135  PHART 7.418  PCO2ART 35.6  PO2ART 231*    Liver Enzymes  Recent Labs Lab 10/31/16 1055  AST 37  ALT 38  ALKPHOS 106  BILITOT 0.8  ALBUMIN 3.5    Cardiac Enzymes No results for input(s): TROPONINI, PROBNP in the last 168 hours.  Glucose  Recent Labs Lab 11/05/16 0350 11/05/16 1151 11/05/16 1549 11/05/16 2337 11/06/16 0350 11/06/16 0749  GLUCAP 101* 112* 109* 84 97 67    Imaging No results found.   STUDIES:  CT Head 7/29 > Negative for bleed, negative acute process, patchy left frontal parietal white matter hypo-intensities  MR Brain 7/29 > Scattered small acute infarcts in the left cerebral hemisphere , greatest in the posterior frontal lobe near the vertex, mild chronic small vessel ischemic disease  CTA Head/Neck 7/30 > negative  for large vessel occlusion, mild progression of bulky soft plaque at the left ICA, with 60% proximal left ICA stenosis CTA Head/Neck 8/2 > Negative for large vessel occlusion, high-grade stenosis of mid left subclavian artery, small vessel atheromatous irregularity involving the distal left MCA and left greater than right PCA branches bilaterally MR Brain 8/2 >>Several new small acute embolic infarcts in the left cerebral hemisphere, predominantly posterior frontal lobe   CULTURES: MRSA by PCR 8/1 > Negative  Staphylococcus 8/1 > Negative   ANTIBIOTICS: Zinacef 8/2 >> 8/3  SIGNIFICANT EVENTS: 7/29 > ED > scattered  emboli in left hemisphere  8/1 > CEA > post-operatively right side weakness > CTA negative   LINES/TUBES: ETT 8/1 >> 8/3 JP drain neck 8/1 >> 8/2  DISCUSSION: 83 year old female presents to ED on 7/29 with right leg weakness, found to have scattered emboli in left hemisphere. 8/1 underwent CEA post-operatively had new right side weakness > CT negative for occlusion or bleed. Remained intubated.   ASSESSMENT / PLAN:  PULMONARY A: Respiratory Insufficiency in post-operative setting, resolved P:   Continue pulmonary hygiene Unclear whether speech therapy evaluation was done but RN was told that she did not pass the initial swallowing screen  CARDIOVASCULAR A:  Hypotension secondary to sedation  Internal Carotid Artery Stenosis  Distal Aortic Arch Ulcer - 4.6 cm ascending aortic aneurysm  H/O HTN and Dyslipidemia  P:  Plans as outlined by vascular surgery and cardiology Restart antihypertensive medications when able to take by mouth Plavix and aspirin as ordered  RENAL A:   Hyperkalemia, resolved P:   Follow BMP intermittently Follow urine output   GASTROINTESTINAL A:   Dysphagia  P:   I don't see any notes from speech therapy, was told that she did not pass the initial swallowing screen Keep NPO until formal input regarding her safety to swallow  HEMATOLOGIC A:   Anticoagulation needs  P:  SCD, Plavix, aspirin Follow CBC  INFECTIOUS A:   No issues  P:   Zinacef surgical prophylaxis completed Follow clinically, WBC  ENDOCRINE A:   No issues    P:    NEUROLOGIC A:   New Right side weakness post-op  Scattered CVC in left hemisphere  H/O Left MCA CVA  P:   RASS goal: 0 Management CVA as per neurology recommendations Aspirin, Plavix  FAMILY  - Updates: Family updated at bedside on 8/3  - Inter-disciplinary family meet or Palliative Care meeting due by:  11/11/2016  Agree with transfer to floor bed. PCCM will sign off. Please call if we can  assist  Baltazar Apo, MD, PhD 11/06/2016, 8:23 AM [facility] [phone] or if no answer [phone]

## 2016-11-06 NOTE — Progress Notes (Signed)
Modified Barium Swallow Progress Note  Patient Details  Name: Regina James MRN: [id number] Date of Birth: [birth year]  Today's Date: 11/06/2016  Modified Barium Swallow completed.  Full report located under Chart Review in the Imaging Section.  Brief recommendations include the following:  Clinical Impression  Patient presents with mild pharyngeal dysphagia characterized by mildly reduced hyolaryngeal excursion and incomplete epiglottic deflection, leading to consistent penetration of thin liquids and intermittent mild vallecular residue. Dry swallows adequate to reduce pharyngeal residue, cued throat clear for expelling penetrate.  Epiglottic deflection more complete with heavier boluses, resulting in improved airway protection. Oral phase WFL; cervical esophageal phase of swallowing was unremarkable. Recommend initiating dys 3 with nectar thick liquids, meds whole in puree, free water protocol (pt may have ice, thin water between meals if within 30 minutes of oral care). Pt with coughing/ throat clearing throughout examination unrelated to laryngeal penetration/residue. SLP will f/u for tolerance, advancement of liquids/solids with or without repeat MBS.    Swallow Evaluation Recommendations       SLP Diet Recommendations: Dysphagia 3 (Mech soft) solids;Nectar thick liquid   Liquid Administration via: Cup;Straw   Medication Administration: Whole meds with puree   Supervision: Patient able to self feed   Compensations: Clear throat intermittently;Multiple dry swallows after each bite/sip;Slow rate;Small sips/bites   Postural Changes: Seated upright at 90 degrees   Oral Care Recommendations: Oral care BID;Other (Comment) (Oral care prior to ice/water)   Other Recommendations: Order thickener from [place], [place], [place] Speech-Language Pathologist [phone]  Aliene Altes 11/06/2016,12:03 PM

## 2016-11-06 NOTE — Evaluation (Signed)
Clinical/Bedside Swallow Evaluation Patient Details  Name: Regina James MRN: [id number] Date of Birth: [birth year]  Today's Date: 11/06/2016 Time: SLP Start Time (ACUTE ONLY): 0845 SLP Stop Time (ACUTE ONLY): 0855 SLP Time Calculation (min) (ACUTE ONLY): 10 min  Past Medical History:  Past Medical History:  Diagnosis Date  . Cancer ([place])    basal cell carcinoma  . History of PSVT (paroxysmal supraventricular tachycardia)    Reported back as far as 2010.  Marland Kitchen Palpitations   . Stroke [facility])    Past Surgical History:  Past Surgical History:  Procedure Laterality Date  . APPENDECTOMY  1993  . CHOLECYSTECTOMY  1996  . ENDARTERECTOMY Left 11/03/2016   Procedure: Exploration of Carotid  ENDARTERECTOMY.;  Surgeon: Elam Dutch, MD;  Location: [place];  Service: Vascular;  Laterality: Left;  . ENDARTERECTOMY Left 11/03/2016   Procedure: ENDARTERECTOMY CAROTID;  Surgeon: Elam Dutch, MD;  Location: [place];  Service: Vascular;  Laterality: Left;  . LOOP RECORDER INSERTION N/A 11/02/2016   Procedure: Loop Recorder Insertion;  Surgeon: [name], MD;  Location: [place] CV LAB;  Service: Cardiovascular;  Laterality: N/A;  . ROTATOR CUFF REPAIR Right 1990  . TEE WITHOUT CARDIOVERSION N/A 11/02/2016   Procedure: TRANSESOPHAGEAL ECHOCARDIOGRAM (TEE);  Surgeon: Lelon Perla, MD;  Location: [facility] ENDOSCOPY;  Service: Cardiovascular;  Laterality: N/A;  . US ECHOCARDIOGRAPHY  01/09/2009   EF 55-60%  . 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 Presented to ED on 7/29 post-fall with right leg weakness. MRI with scattered small acute infarcts in the left cerebral hemisphere. 8/1 underwent CEA, found to have left carotid hematoma and right sided weakness so taken back to OR for evacuation, where a 5 x 5 cm hematoma was  evacuated. Post-operatively Emergent head CT without hemorrhage or large vessel occlusion. ETT 8/1-8/3. Per chart, pt passed RN swallow screen on 10/31/16. Screened by ST for cognition/language on 11/01/16 with no recommended f/u for cognitive-linguistic treatment.   Assessment / Plan / Recommendation Clinical Impression  Patient presents with what is likely an acute, reversible dysphagia s/p 3 day intubation. She presents today with clinical signs of aspiration with ice chips, thin liquids and pureed solids; following all PO trials, pt with immediate cough, suggestive of reduced airway protection. Her voice is breathy, cough weak. Inhalatory stridor noted. Given her clinical signs of aspiration and risk factors including CVA, recommend instrumental assessment (MBS) to objectively evaluate swallowing function prior to diet initiation. MBS ordered for this date. Additional recommendations to follow MBS. Recommend pt remain NPO pending instrumental assessment given her moderate-severe risk for aspiration. 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 Presented to ED on 7/29 post-fall with right  leg weakness. MRI with scattered small acute infarcts in the left cerebral hemisphere. 8/1 underwent CEA, found to have left carotid hematoma and right sided weakness so taken back to OR for evacuation, where a 5 x 5 cm hematoma was evacuated.  Post-operatively Emergent head CT without hemorrhage or large vessel occlusion. ETT 8/1-8/3. Per chart, pt passed RN swallow screen on 10/31/16. Screened by ST for cognition/language on 11/01/16 with no recommended f/u for cognitive-linguistic treatment. Type of Study: Bedside Swallow Evaluation Previous Swallow Assessment: none in chart Diet Prior to this Study: NPO Temperature Spikes Noted: No Respiratory Status: Nasal cannula History of Recent Intubation: Yes Length of Intubations (days): 3 days Date extubated: 11/05/16 Behavior/Cognition: Alert;Cooperative Oral Cavity Assessment: Within Functional Limits Oral Care Completed by SLP: Yes Oral Cavity - Dentition: Adequate natural dentition Vision: Functional for self-feeding Self-Feeding Abilities: Able to feed self Patient Positioning: Upright in bed Baseline Vocal Quality: Breathy;Low vocal intensity Volitional Cough: Weak Volitional Swallow: Able to elicit    Oral/Motor/Sensory Function Overall Oral Motor/Sensory Function: Within functional limits   Ice Chips Ice chips: Impaired Presentation: Spoon Pharyngeal Phase Impairments: Cough - Immediate   Thin Liquid Thin Liquid: Impaired Pharyngeal  Phase Impairments: Cough - Immediate    Nectar Thick Nectar Thick Liquid: Not tested   Honey Thick Honey Thick Liquid: Not tested   Puree Puree: Impaired Pharyngeal Phase Impairments: Cough - Immediate   Solid   GO   Solid: Not tested       Deneise Lever, MS, CCC-SLP Speech-Language Pathologist [phone]  Aliene Altes 11/06/2016,9:01 AM

## 2016-11-07 LAB — GLUCOSE, CAPILLARY
Glucose-Capillary: 102 mg/dL — ABNORMAL HIGH (ref 65–99)
Glucose-Capillary: 112 mg/dL — ABNORMAL HIGH (ref 65–99)
Glucose-Capillary: 127 mg/dL — ABNORMAL HIGH (ref 65–99)
Glucose-Capillary: 161 mg/dL — ABNORMAL HIGH (ref 65–99)
Glucose-Capillary: 90 mg/dL (ref 65–99)
Glucose-Capillary: 92 mg/dL (ref 65–99)

## 2016-11-07 MED ORDER — CLOPIDOGREL BISULFATE 75 MG PO TABS
75.0000 mg | ORAL_TABLET | Freq: Every day | ORAL | Status: DC
  Administered 2016-11-08 – 2016-11-10 (×3): 75 mg via ORAL
  Filled 2016-11-07 (×3): qty 1

## 2016-11-07 MED ORDER — DOCUSATE SODIUM 100 MG PO CAPS
100.0000 mg | ORAL_CAPSULE | Freq: Every day | ORAL | Status: DC
  Administered 2016-11-07 – 2016-11-10 (×4): 100 mg via ORAL
  Filled 2016-11-07 (×4): qty 1

## 2016-11-07 MED ORDER — ASPIRIN 325 MG PO TABS
325.0000 mg | ORAL_TABLET | Freq: Every day | ORAL | Status: DC
  Administered 2016-11-08 – 2016-11-10 (×3): 325 mg via ORAL
  Filled 2016-11-07 (×3): qty 1

## 2016-11-07 MED ORDER — ADULT MULTIVITAMIN W/MINERALS CH
1.0000 | ORAL_TABLET | Freq: Every day | ORAL | Status: DC
  Administered 2016-11-07 – 2016-11-10 (×4): 1 via ORAL
  Filled 2016-11-07 (×4): qty 1

## 2016-11-07 NOTE — Progress Notes (Signed)
Rehab Admissions Coordinator Note:  Patient was screened by Retta Diones for appropriateness for an Inpatient Acute Rehab Consult.  At this time, we are recommending Inpatient Rehab consult.  Retta Diones 11/07/2016, 4:43 PM  I can be reached at [phone].

## 2016-11-07 NOTE — Progress Notes (Signed)
STROKE TEAM PROGRESS NOTE    Attending Addendum:  Left frontal multiple small embolic infarcts probably due to left carotid stenosis and plaque s/p left CEA.  Neurological exam has improved some vs yesterday. Cardioembolic infarcts cannot be completely ruled out.   TEE and telemetry are negative.  Awaiting ICLR findings for ?atrial fibrillation.  Dual antiplatelet therapy due to CEA for now.  Anticipate inpatient rehab.  Regina Jury, MS, MD  History on Admission Regina James is an 83 y.o. female with a past medical history significant for CVA in March, 2018 with residual proximal RUE weakness, HTN, tobacco abuse, tachyarrhythmia that does not appear to be afib (per cards workup in March) presents with right sided weakness. She states that she woke up around 0400 by a severe right leg cramp and persistent right leg weakness following resolution of the cramp. She states that the RUE weakness has worsened. She denies word trouble speaking and focal neurological deficit otherwise. Regina James states acknowledges that she does not take plavix as prescribed.   Pertinent Imaging: CT head 10/31/16: Patchy left frontal parietal white matter hypointensities more conspicuous than on prior study. MRI 10/31/16: pending formal read CTA head/neck 06/27/16:No LVO. Hypoplastic right vertebral artery. Atheromatous plaque up to 30% on the right and 40-50% on the left carotid bifurcations. Superimposed penetrating atheromatous ulcer at the aortic arch. Narrowing of approximately 40% at the origin of the great vessels. Severe 80% left subclavian artery stenosis as above. MR brain 3/25: Widely scattered but small, mostly cortically based, acute infarcts in the posterior Left MCA and the superior Left PCA territories. Otherwise stable since 2007.  Date last known well: Date: 10/30/2016 Time last known well: Time: 23:00  tPA Given: No: Out of window, no cortical signs   SUBJECTIVE (INTERVAL HISTORY)  She is  awake and alert. Transferred to the floor.  OBJECTIVE Temp:  [98.3 F (36.8 C)-99.9 F (37.7 C)] 99.3 F (37.4 C) (08/05 0826) Pulse Rate:  [59-102] 77 (08/05 1500) Cardiac Rhythm: Normal sinus rhythm;Sinus bradycardia (08/05 1200) Resp:  [15-25] 15 (08/05 1500) BP: (128-170)/(53-102) 128/53 (08/05 1400) SpO2:  [92 %-99 %] 96 % (08/05 1500) Weight:  [160 lb 0.9 oz (72.6 kg)] 160 lb 0.9 oz (72.6 kg) (08/05 0443)  CBC:   Recent Labs Lab 11/01/16 0341  11/05/16 0526 11/06/16 0307  WBC 7.0  < > 15.2* 12.0*  NEUTROABS 3.6  --   --   --   HGB 13.0  < > 11.3* 11.1*  HCT 38.5  < > 33.9* 33.3*  MCV 93.2  < > 97.7 94.6  PLT 262  < > 280 294  < > = values in this interval not displayed.  Basic Metabolic Panel:   Recent Labs Lab 11/04/16 0358  11/05/16 1333 11/06/16 0307  NA 142  < > 139 138  K 3.7  < > 3.7 3.6  CL 111  < > 111 108  CO2 23  < > 24 23  GLUCOSE 158*  < > 111* 99  BUN 14  < > 15 10  CREATININE 0.79  < > 0.53 0.55  CALCIUM 8.0*  < > 8.2* 8.2*  MG 1.5*  --   --   --   PHOS 3.9  --   --   --   < > = values in this interval not displayed.  Lipid Panel:     Component Value Date/Time   CHOL 147 11/01/2016 0341   TRIG 71 11/04/2016 0358  HDL 42 11/01/2016 0341   CHOLHDL 3.5 11/01/2016 0341   VLDL 16 11/01/2016 0341   LDLCALC 89 11/01/2016 0341   HgbA1c:  Lab Results  Component Value Date   HGBA1C 5.7 (H) 11/01/2016   Urine Drug Screen:     Component Value Date/Time   LABOPIA NONE DETECTED 10/31/2016 1541   COCAINSCRNUR NONE DETECTED 10/31/2016 1541   LABBENZ NONE DETECTED 10/31/2016 1541   AMPHETMU NONE DETECTED 10/31/2016 1541   THCU NONE DETECTED 10/31/2016 1541   LABBARB NONE DETECTED 10/31/2016 1541    Alcohol Level     Component Value Date/Time   ETH <5 10/31/2016 1055    IMAGING I have personally reviewed the radiological images below and agree with the radiology interpretations.  Ct Head Wo Contrast 10/31/2016 1. Negative for bleed  or other acute process.  2. Patchy left frontal parietal white matter hypointensities more conspicuous than on prior study.   Ct Angio Head W Or Wo Contrast Ct Angio Neck W Or Wo Contrast 11/01/2016 1. Penetrating Aortic Ulcer (PAU) with progression since March, now measuring up to 14 mm. See series 9, image 171 and series 12, image 127.  2. Negative for large vessel occlusion. Mild progression of bulky soft plaque at the left ICA origin since March, with  60% proximal Left ICA stenosis. No other large vessel stenosis.  3. Mild to moderate atherosclerotic irregularity of the left MCA and left greater than right PCA branches.  4. Continued stable non contrast CT appearance of the brain.     Mr Brain Limited Wo Contrast 10/31/2016 1. Scattered small acute infarcts in the left cerebral hemisphere, greatest in the posterior frontal lobe near the vertex.  2. Mild chronic small vessel ischemic disease.   2D Echo 06/28/2016 Study Conclusions - Left ventricle: The cavity size was normal. There was mild concentric hypertrophy. Systolic function was normal. The estimated ejection fraction was in the range of 55% to 60%. Wall motion was normal; there were no regional wall motion abnormalities. Doppler parameters are consistent with abnormal left ventricular relaxation (grade 1 diastolic dysfunction).  Doppler parameters are consistent with high ventricular filling pressure. - Aortic valve: Transvalvular velocity was within the normal range.  There was no stenosis. There was no regurgitation. - Mitral valve: Transvalvular velocity was within the normal range.  There was no evidence for stenosis. There was no regurgitation. - Left atrium: The atrium was moderately dilated. - Right ventricle: The cavity size was normal. Wall thickness was normal. Systolic function was normal. - Atrial septum: No defect or patent foramen ovale was identified. - Tricuspid valve: There was mild regurgitation. - Pulmonary  arteries: Systolic pressure was within the normal   range. PA peak pressure: 30 mm Hg (S).   VAS [place] LE 06/28/2016 Summary: - No evidence of deep vein or superficial thrombosis involving the right lower extremity and left lower extremity.  TEE Normal LV function; no LAA thrombus; atrial septal aneurysm; negative saline microcavitation study.   Ct Angio Head and neck W Or Wo Contrast 11/04/2016 1. Negative CTA for emergent large vessel occlusion.  2. Postoperative changes from recent left carotid endarterectomy without complication. No significant residual stenosis.  3. High-grade approximate 80% stenosis involving the mid left subclavian artery.  4. No other high-grade or critical flow limiting stenosis.  5. Penetrating aortic ulcer or, measuring 15 mm on today's exam, previously 14 mm on 11/01/2016.  6. Small vessel atheromatous irregularity involving the distal left MCA and left greater than right  PCA branches bilaterally.    Mr Brain Wo Contrast 11/04/2016 Several new small acute embolic infarcts in the left cerebral hemisphere, predominantly in the posterior frontal lobe near the vertex.    PHYSICAL EXAM  Temp:  [98.3 F (36.8 C)-99.9 F (37.7 C)] 99.3 F (37.4 C) (08/05 0826) Pulse Rate:  [59-102] 77 (08/05 1500) Resp:  [15-25] 15 (08/05 1500) BP: (128-170)/(53-102) 128/53 (08/05 1400) SpO2:  [92 %-99 %] 96 % (08/05 1500) Weight:  [160 lb 0.9 oz (72.6 kg)] 160 lb 0.9 oz (72.6 kg) (08/05 0443)   Neuro - Awake, oriented fully.  Alert. Face symmetrical.  PERL.  EOMI. Tongue midline. Strength 3/5 RUE and RLE.   5/5 LUE and LLE. Coordination is intact bilaterally. Sensory intact bilaterally.  Gait -deferred.  ASSESSMENT/PLAN Regina James is a 83 y.o. female with history of cancer, history PSVT, palpitations, tobacco abuse, CVA in March, 2018 with residual proximal RUE weakness, HTN, who presented with right sided weakness. She did not receive IV t-PA due to  arriving outside of the treatment window/presenting without cortical signs.  Stroke:   1.Scattered small acute infarcts in the left MCA and ACA, likely embolic, due to left ICA soft plaque with stenosis vs. cardioembolic with PSVT and suspicious for afib. 2. Recurrent scattered left MCA infarct s/p left CEA with neck hematoma after evacuation - likely related to procedure complication of neck hematoma  Resultant  Right hemiparesis  CT head: Patchy left frontal parietal white matter hypointensities more conspicuous than on prior study.  CTA: increased left proximal ICA soft plaque and stenosis now at 60%. No LVO or high-grade stenosis.  14 mm penetrating aortic ulcer.  MRI head 10/31/16: Scattered small acute infarcts in the left cerebral hemisphere, greatest in the posterior frontal lobe near the vertex.   MRI head 11/04/16: Several new small acute embolic infarcts in the left cerebral hemisphere, predominantly in the posterior frontal lobe near the vertex.  2D Echo (06/28/2016): EF 60-65%. No source of embolus.   TEE unremarkable   Loop recorder placed  LDL 89   HgbA1c 5.7  SCDs for VTE prophylaxis  clopidogrel 75 mg daily prior to admission, now on aspirin 325 mg daily and clopidogrel 75 mg daily. Continue DAPT for 3 months and then plavix alone.  Patient counseled to be compliant with her antithrombotic medications  Ongoing aggressive stroke risk factor management  Therapy recommendations:  pending  Disposition:  Pending  Left ICA stenosis with soft plaque s/p CEA  Could be the reason for recurrent left MCA / ACA infarcts  Left CEA on 11/03/16 - evacuation hematoma left carotid, exploration left carotid artery - Dr Oneida Alar - POD # 4  Continue DAPT  Aortic ulceration   Distal to the left CCA take off - unlikely the cause of her stroke  Recommend vascular surgery or CVST consultation for further monitoring and management  Hx of stroke  06/2016 right hand weakness - CTA  head negative - CTA head left ICA 40-50% stenosis, aortic ulceration - MRI scattered left MCA infarcts - LDL 95 and A1C 5.6 - changed ASA to plavix and add lipitor - recommend outpt TEE and loop  08/2016 followed up in [place] with Dr. Leta Baptist - arranged TEE and loop but pt did not answer the arrangement with cardiology  Pt also noncompliant with plavix and lipitor  Hypertension  Unstable  Permissive hypertension (OK if < 220/120) but gradually normalize in 5-7 days  Long-term BP goal normotensive  128/53  Hyperlipidemia  Home meds: atorvastatin 20mg , not taking at home  LDL 89, goal < 70  Will change to pravastatin 20mg  for compliance  Continue statin at discharge   Tobacco abuse  Current smoker  Smoking cessation counseling provided  Nicotine patch provided  Pt is willing to quit  Other Stroke Risk Factors  Advanced age  ETOH use, advised to drink no more than 1 drink per day   Other Active Problems  Extubated yesterday Friday  NPO per speech evaluation today Saturday  Mild anemia  Mild leukocytosis (afebrile) (currently on no antibiotics)   PLAN  Transfer to floor  PT / [facility] day # 7          To contact Stroke Continuity provider, please refer to [email or web address] After hours, contact General Neurology

## 2016-11-07 NOTE — Plan of Care (Signed)
Problem: Nutrition: Goal: Risk of aspiration will decrease Outcome: Progressing Patient educated on dysphagia and risks of aspiration and signs of symptoms of aspiration

## 2016-11-07 NOTE — Progress Notes (Signed)
   VASCULAR SURGERY ASSESSMENT & PLAN:   4 Days Post-Op s/p: Left carotid endarterectomy.  Awaiting bed on 4 E.  Continue OT and PT.  On aspirin and Plavix.  SUBJECTIVE:   Feels better today.  PHYSICAL EXAM:   Vitals:   11/07/16 0700 11/07/16 0800 11/07/16 0826 11/07/16 0900  BP:  (!) 152/73    Pulse: 66 79  80  Resp: 16 18  16   Temp:   99.3 F (37.4 C)   TempSrc:   Oral   SpO2: 94% 96%  95%  Weight:      Height:       Slightly improved strength right upper extremity and right lower extremity. Left neck incision looks fine.  LABS:   Lab Results  Component Value Date   WBC 12.0 (H) 11/06/2016   HGB 11.1 (L) 11/06/2016   HCT 33.3 (L) 11/06/2016   MCV 94.6 11/06/2016   PLT 294 11/06/2016   Lab Results  Component Value Date   CREATININE 0.55 11/06/2016   Lab Results  Component Value Date   INR 1.06 11/02/2016   CBG (last 3)   Recent Labs  11/06/16 1957 11/06/16 2319 11/07/16 0353  GLUCAP 157* 108* 102*    PROBLEM LIST:    Principal Problem:   Stroke [facility]) Active Problems:   Mixed dyslipidemia   Essential hypertension   Tachyarrhythmia   Internal carotid artery stenosis, left   Aortic arch aneurysm (HCC)   CURRENT MEDS:   . aspirin  325 mg Per Tube Daily  . clopidogrel  75 mg Per Tube Daily  . docusate sodium  100 mg Oral Daily  . insulin aspart  0-9 Units Subcutaneous Q4H  . mouth rinse  15 mL Mouth Rinse BID  . metoprolol succinate  50 mg Oral Daily  . multivitamin with minerals  1 tablet Oral Daily  . nicotine  14 mg Transdermal Daily  . pantoprazole  40 mg Oral Daily  . potassium chloride  20 mEq Oral BID  . pravastatin  20 mg Oral q1800    Gae Gallop Beeper: [phone] Office: [phone] 11/07/2016

## 2016-11-07 NOTE — Evaluation (Signed)
Physical Therapy Re-Evaluation Patient Details Name: Regina James MRN: [id number] DOB: 1933/06/02 Today's Date: 11/07/2016   History of Present Illness  Regina James is an 83 y.o. female with a history of cancer, history PSVT, palpitations, tobacco abuse, CVA in March, 2018 with residual proximal RUE weakness, HTN, who presented with right sided weakness. Scattered small acute infarcts in the left cerebral hemisphere, greatest in the posterior frontal lobe near the vertex. S/p L CEA 8/1 and taken back into surgery to evacuate 5x5 cm hematoma. Vent dependent until extubation 8/3. Latest MRI 8/2 shows new infarcts as compared to 7/29 MRI.   Clinical Impression  Pt presented awake and alert with daughter present in the room. She was willing to participate in session and required only supervision with minimal cues for bed mobility. Since initial PT evaluation, she is showing new impairments in gait secondary to new acute infarcts and two surgeries. She was unsteady on her feet during turns and required min assist to stabilize and prevent a fall. [name]' quality of gait has decreased to a step to pattern, with her R LE lagging behind. Pt will benefit from continued skilled PT to address stroke impairments and regain motor control and strength of her R extremities.     Follow Up Recommendations CIR    Equipment Recommendations  Rolling walker with 5" wheels    Recommendations for Other Services Speech consult (cognition assessment )     Precautions / Restrictions Precautions Precautions: Fall Restrictions Weight Bearing Restrictions: No      Mobility  Bed Mobility Overal bed mobility: Needs Assistance Bed Mobility: Supine to Sit     Supine to sit: HOB elevated;Supervision     General bed mobility comments: increased time; cues for repositioning   Transfers Overall transfer level: Needs assistance Equipment used: Rolling walker (2 wheeled) Transfers: Sit to/from Stand Sit  to Stand: Min guard         General transfer comment: cues for safety, repositioning to get feet flat, and hand placement for standing;  Ambulation/Gait Ambulation/Gait assistance: Min guard; Min assist Ambulation Distance (Feet): 20 Feet Assistive device: Rolling walker (2 wheeled) Gait Pattern/deviations: Step-to pattern;Decreased step length - right;Shuffle     General Gait Details: R LE instability at times; decreased R step length and foot clearance; cues for safety with turns and to bring LEs within the base of the walker, tends to keep walker too far ahead; required min assist during turn to prevent a fall; step by step cues for safety and walker use with turns  Science writer    Modified Rankin (Stroke Patients Only)       Balance Overall balance assessment: Needs assistance Sitting-balance support: Feet supported Sitting balance-Leahy Scale: Good Sitting balance - Comments: cues needed to place feet flat on the floor    Standing balance support: Bilateral upper extremity supported Standing balance-Leahy Scale: Fair Standing balance comment: UE support for balance                             Pertinent Vitals/Pain Pain Assessment: 0-10 Pain Score: 3  Pain Location: neck incision site  Pain Descriptors / Indicators: Tightness Pain Intervention(s): Monitored during session;Limited activity within patient's tolerance    Home Living Family/patient expects to be discharged to:: Inpatient rehab Living Arrangements: Spouse/significant other Available Help at Discharge: Family;Available 24 hours/day Type of Home: [place]  Access: Stairs to enter Entrance Stairs-Rails: Left;Right;Can reach both Technical brewer of Steps: 3 to sidewalk, 2 to front door Home Layout: Two level;Bed/bath upstairs Home Equipment: Shower seat - built in;Cane - single point      Prior Function Level of Independence: Independent                Hand Dominance   Dominant Hand: Right    Extremity/Trunk Assessment   Upper Extremity Assessment Upper Extremity Assessment: Generalized weakness;RUE deficits/detail RUE Deficits / Details: weakness throughout RUE, PROM shoulder flexion not limited, decreased AROM, extreme difficulty elevating R UE against gravity    RUE Coordination: decreased fine motor;decreased gross motor    Lower Extremity Assessment Lower Extremity Assessment: RLE deficits/detail;Generalized weakness RLE Deficits / Details: decreased awareness of foot placement during ambulation, hip flexion 3+/5, knee flexion 3+/5, knee extension 3+/5 RLE Coordination: decreased gross motor    Cervical / Trunk Assessment Cervical / Trunk Assessment: Other exceptions Cervical / Trunk Exceptions: decreased cervical ROM in all directions secondary to incision and feelings of tightness   Communication   Communication: No difficulties  Cognition Arousal/Alertness: Awake/alert Behavior During Therapy: WFL for tasks assessed/performed Overall Cognitive Status: Impaired/Different from baseline Area of Impairment: Awareness;Safety/judgement;Problem solving                         Safety/Judgement: Decreased awareness of deficits;Decreased awareness of safety Awareness: Intellectual Problem Solving: Slow processing;Difficulty sequencing;Requires verbal cues        General Comments  VSS    Exercises     Assessment/Plan    PT Assessment Patient needs continued PT services  PT Problem List Decreased strength;Decreased activity tolerance;Decreased balance;Decreased mobility;Decreased coordination;Decreased cognition;Decreased knowledge of use of DME;Decreased safety awareness;Pain       PT Treatment Interventions DME instruction;Gait training;Stair training;Functional mobility training;Therapeutic activities;Therapeutic exercise;Balance training;Neuromuscular re-education;Cognitive  remediation;Patient/family education    PT Goals (Current goals can be found in the Care Plan section)  Acute Rehab PT Goals PT Goal Formulation: With patient Time For Goal Achievement: 11/16/16 Potential to Achieve Goals: Good    Frequency Min 4X/week   Barriers to discharge        Co-evaluation               AM-PAC PT "6 Clicks" Daily Activity  Outcome Measure Difficulty turning over in bed (including adjusting bedclothes, sheets and blankets)?: A Little Difficulty moving from lying on back to sitting on the side of the bed? : A Little Difficulty sitting down on and standing up from a chair with arms (e.g., wheelchair, bedside commode, etc,.)?: Total Help needed moving to and from a bed to chair (including a wheelchair)?: A Little Help needed walking in hospital room?: A Little Help needed climbing 3-5 steps with a railing? : A Little 6 Click Score: 16    End of Session Equipment Utilized During Treatment: Gait belt Activity Tolerance: Patient tolerated treatment well Patient left: in chair;with call bell/phone within reach   PT Visit Diagnosis: Unsteadiness on feet (R26.81);Other abnormalities of gait and mobility (R26.89);Muscle weakness (generalized) (M62.81);Hemiplegia and hemiparesis Hemiplegia - Right/Left: Right Hemiplegia - dominant/non-dominant: Dominant Hemiplegia - caused by: Cerebral infarction    Time: 0093-8182 PT Time Calculation (min) (ACUTE ONLY): 25 min   Charges:   PT Evaluation $PT Re-evaluation: 1 Re-eval PT Treatments $Gait Training: 8-22 mins   PT G Codes:        Ave Filter, SPT Acute Rehabilitation Services Office: [phone]  Ave Filter  11/07/2016, 4:06 PM

## 2016-11-08 DIAGNOSIS — I63032 Cerebral infarction due to thrombosis of left carotid artery: Secondary | ICD-10-CM

## 2016-11-08 DIAGNOSIS — I69391 Dysphagia following cerebral infarction: Secondary | ICD-10-CM

## 2016-11-08 DIAGNOSIS — I712 Thoracic aortic aneurysm, without rupture: Secondary | ICD-10-CM

## 2016-11-08 DIAGNOSIS — R531 Weakness: Secondary | ICD-10-CM

## 2016-11-08 DIAGNOSIS — R0682 Tachypnea, not elsewhere classified: Secondary | ICD-10-CM

## 2016-11-08 DIAGNOSIS — I471 Supraventricular tachycardia, unspecified: Secondary | ICD-10-CM

## 2016-11-08 DIAGNOSIS — I638 Other cerebral infarction: Secondary | ICD-10-CM

## 2016-11-08 DIAGNOSIS — I6300 Cerebral infarction due to thrombosis of unspecified precerebral artery: Secondary | ICD-10-CM

## 2016-11-08 DIAGNOSIS — Z9119 Patient's noncompliance with other medical treatment and regimen: Secondary | ICD-10-CM

## 2016-11-08 DIAGNOSIS — D62 Acute posthemorrhagic anemia: Secondary | ICD-10-CM

## 2016-11-08 DIAGNOSIS — D72829 Elevated white blood cell count, unspecified: Secondary | ICD-10-CM

## 2016-11-08 DIAGNOSIS — I10 Essential (primary) hypertension: Secondary | ICD-10-CM

## 2016-11-08 DIAGNOSIS — Z85828 Personal history of other malignant neoplasm of skin: Secondary | ICD-10-CM

## 2016-11-08 DIAGNOSIS — Z720 Tobacco use: Secondary | ICD-10-CM

## 2016-11-08 DIAGNOSIS — Z91199 Patient's noncompliance with other medical treatment and regimen due to unspecified reason: Secondary | ICD-10-CM

## 2016-11-08 DIAGNOSIS — I6930 Unspecified sequelae of cerebral infarction: Secondary | ICD-10-CM

## 2016-11-08 LAB — GLUCOSE, CAPILLARY
Glucose-Capillary: 106 mg/dL — ABNORMAL HIGH (ref 65–99)
Glucose-Capillary: 110 mg/dL — ABNORMAL HIGH (ref 65–99)
Glucose-Capillary: 122 mg/dL — ABNORMAL HIGH (ref 65–99)
Glucose-Capillary: 78 mg/dL (ref 65–99)
Glucose-Capillary: 87 mg/dL (ref 65–99)
Glucose-Capillary: 90 mg/dL (ref 65–99)
Glucose-Capillary: 96 mg/dL (ref 65–99)
Glucose-Capillary: 98 mg/dL (ref 65–99)

## 2016-11-08 NOTE — Progress Notes (Addendum)
  Progress Note    11/08/2016 7:26 AM 5 Days Post-Op  Subjective:  Awake; no complaints; no difficulty swallowing.  afebrile HR 50's-70's  212'Y-482'N systolic 00% RA  Vitals:   11/07/16 2356 11/08/16 0400  BP: (!) 136/92 (!) 157/100  Pulse: 70 (!) 59  Resp: 18 16  Temp: 98.6 F (37 C) 98.4 F (36.9 C)     Physical Exam: Neuro:  4/5 RUE; 3/5 RLE; tongue is midline Lungs:  Non labored Incision:  Clean and dry with some swelling  CBC    Component Value Date/Time   WBC 12.0 (H) 11/06/2016 0307   RBC 3.52 (L) 11/06/2016 0307   HGB 11.1 (L) 11/06/2016 0307   HCT 33.3 (L) 11/06/2016 0307   PLT 294 11/06/2016 0307   MCV 94.6 11/06/2016 0307   MCH 31.5 11/06/2016 0307   MCHC 33.3 11/06/2016 0307   RDW 15.2 11/06/2016 0307   LYMPHSABS 2.5 11/01/2016 0341   MONOABS 0.7 11/01/2016 0341   EOSABS 0.2 11/01/2016 0341   BASOSABS 0.1 11/01/2016 0341    BMET    Component Value Date/Time   NA 138 11/06/2016 0307   K 3.6 11/06/2016 0307   CL 108 11/06/2016 0307   CO2 23 11/06/2016 0307   GLUCOSE 99 11/06/2016 0307   BUN 10 11/06/2016 0307   CREATININE 0.55 11/06/2016 0307   CALCIUM 8.2 (L) 11/06/2016 0307   GFRNONAA >60 11/06/2016 0307   GFRAA >60 11/06/2016 0307     Intake/Output Summary (Last 24 hours) at 11/08/16 0726 Last data filed at 11/07/16 2000  Gross per 24 hour  Intake              240 ml  Output              785 ml  Net             -545 ml     Assessment/Plan:  This is a 83 y.o. female who is s/p left CEA 5 Days Post-Op  -pt is doing well this am.  Transferred from ICU yesterday -pt with right sided weakness more so in the RLE -pt has ambulated in the room-PT worked with pt yesterday and recommending CIR.  Will place consult.    Leontine Locket, PA-C [facility] [phone]  Agree with above.  Still some discoordination of right leg and arm but much improved Mild swelling right neck should resolve over next few  weeks Hopefully to rehab soon  Ruta Hinds, MD [facility] [place]: [phone] Pager: [phone]

## 2016-11-08 NOTE — Consult Note (Signed)
Physical Medicine and Rehabilitation Consult Reason for Consult: Decreased functional mobility Referring Physician: Dr. Oneida Alar   HPI: Regina James is a 83 y.o. right handed female with history of basal cell carcinoma, PSVT, CVA March 2018 with residual proximal right upper extremity weakness, tobacco abuse, medical noncompliance. Per chart review patient lives with spouse, who she is a caregiver for, reported to be independent prior to admission. 2 level home with bedroom and bathroom upstairs. Husband can provide only supervision. Family in area works. Presented 10/31/2016 after a fall with increasing right upper and lower extremity weakness. Patient remained down for extended time. Cranial CT scan negative for bleed or other acute process. MRI reviewed, showing multiple left CVA.  Per report, scattered small acute infarcts in the left cerebral hemisphere greatest in the posterior frontal lobe near the vertex. CT angiogram of head and neck showed a penetrating aortic ulcer with progression since March now measuring 14 mm. Negative for large vessel occlusion. Mild progression of bulky soft plaque at the left ICA origin since March with 60% proximal left ICA stenosis. Echocardiogram with ejection fraction of 60% no wall motion abnormalities. A TEE was performed as well as placement of loop recorder. Vascular consult Dr. Oneida Alar for symptomatic left ICA stenosis. Underwent left carotid endarterectomy 11/03/2016. Postoperative increased right sided weakness MRI showing several new small acute embolic infarcts in the left cerebral hemisphere. Neurology follow-up currently maintained on aspirin and Plavix for CVA prophylaxis. Dysphagia #3 nectar thick liquid diet. Physical therapy evaluation completed 11/07/2016 with recommendations of physical medicine rehabilitation consult.   Review of Systems  Constitutional: Negative for chills and fever.  HENT: Negative for hearing loss.   Eyes: Negative for  blurred vision and double vision.  Respiratory: Negative for cough and shortness of breath.   Cardiovascular: Positive for palpitations and leg swelling. Negative for chest pain.  Gastrointestinal: Positive for constipation. Negative for nausea and vomiting.  Genitourinary: Negative for dysuria, flank pain and hematuria.  Musculoskeletal: Positive for falls, joint pain and myalgias.  Skin: Negative for rash.  Neurological: Positive for focal weakness and weakness. Negative for sensory change and seizures.  All other systems reviewed and are negative.  Past Medical History:  Diagnosis Date  . Cancer ([place])    basal cell carcinoma  . History of PSVT (paroxysmal supraventricular tachycardia)    Reported back as far as 2010.  Marland Kitchen Palpitations   . Stroke [facility])    Past Surgical History:  Procedure Laterality Date  . APPENDECTOMY  1993  . CHOLECYSTECTOMY  1996  . ENDARTERECTOMY Left 11/03/2016   Procedure: Exploration of Carotid  ENDARTERECTOMY.;  Surgeon: Elam Dutch, MD;  Location: [place];  Service: Vascular;  Laterality: Left;  . ENDARTERECTOMY Left 11/03/2016   Procedure: ENDARTERECTOMY CAROTID;  Surgeon: Elam Dutch, MD;  Location: [place];  Service: Vascular;  Laterality: Left;  . LOOP RECORDER INSERTION N/A 11/02/2016   Procedure: Loop Recorder Insertion;  Surgeon: [name], MD;  Location: [place] [facility];  Service: Cardiovascular;  Laterality: N/A;  . ROTATOR CUFF REPAIR Right 1990  . TEE WITHOUT CARDIOVERSION N/A 11/02/2016   Procedure: TRANSESOPHAGEAL ECHOCARDIOGRAM (TEE);  Surgeon: Lelon Perla, MD;  Location: [facility] ENDOSCOPY;  Service: Cardiovascular;  Laterality: N/A;  . US ECHOCARDIOGRAPHY  01/09/2009   EF 55-60%  . WRIST FRACTURE SURGERY     Family History  Problem Relation Age of Onset  . Cancer Father        prostate  .  Diabetes Father   . Varicose Veins Mother   . Cardiomyopathy Mother   . Kidney cancer Brother   . Arrhythmia Brother    Social  History:  reports that she quit smoking about 4 months ago. Her smoking use included Cigarettes. She has a 11.25 pack-year smoking history. She has never used smokeless tobacco. She reports that she drinks about 4.2 oz of alcohol per week . She reports that she does not use drugs. Allergies:  Allergies  Allergen Reactions  . Morphine And Related Swelling  . Levofloxacin Anxiety and Other (See Comments)    Double vision   Medications Prior to Admission  Medication Sig Dispense Refill  . atorvastatin (LIPITOR) 20 MG tablet TAKE 1 TABLET AT 6PM. (Patient taking differently: Take 10 mg by mouth every 3 days) 30 tablet 6  . clopidogrel (PLAVIX) 75 MG tablet TAKE 1 TABLET ONCE DAILY. (Patient taking differently: Take 37.5 mg by mouth every 3 days) 30 tablet 6  . Doxylamine Succinate, Sleep, (SLEEP AID PO) Take 1 tablet by mouth at bedtime.     . metoprolol succinate (TOPROL-XL) 100 MG 24 hr tablet Take  0.5 tablet ( 50 mg total) once daily 45 tablet 3  . Multiple Vitamin (MULTIVITAMIN) tablet Take 1 tablet by mouth daily.      Vladimir Faster Glycol-Propyl Glycol (SYSTANE OP) Place 1 drop into both eyes daily as needed (for dry eyes).       Home: Home Living Family/patient expects to be discharged to:: Inpatient rehab Living Arrangements: Spouse/significant other Available Help at Discharge: Family, Available 24 hours/day Type of Home: House Home Access: Stairs to enter [employer] of Steps: 3 to sidewalk, 2 to front door Entrance Stairs-Rails: Left, Right, Can reach both Home Layout: Two level, Bed/bath upstairs Alternate Level Stairs-Number of Steps: 8, a landing, then 4 more Alternate Level Stairs-Rails: Left Bathroom Shower/Tub: Multimedia programmer: Handicapped height Bathroom Accessibility: Yes Home Equipment: Shower seat - built in, [place] - single point  Functional History: Prior Function Level of Independence: Independent Comments: just trouble with handwriting  (was playing golf - limited recently by back pain) Functional Status:  Mobility: Bed Mobility Overal bed mobility: Needs Assistance Bed Mobility: Supine to Sit Supine to sit: HOB elevated, Supervision Sit to supine: Supervision General bed mobility comments: increased time; cues for repositioning  Transfers Overall transfer level: Needs assistance Equipment used: Rolling walker (2 wheeled) Transfers: Sit to/from Stand Sit to Stand: Min guard General transfer comment: cues for safety, repositioning to get feet flat, and hand placement for standing; Ambulation/Gait Ambulation/Gait assistance: Min assist Ambulation Distance (Feet): 20 Feet Assistive device: Rolling walker (2 wheeled) Gait Pattern/deviations: Step-to pattern, Decreased step length - right, Shuffle General Gait Details: R LE instability at times; decreased R step length and foot clearance; cues for safety with turns and to bring LEs within the base of the walker Stairs: Yes Stairs assistance: Min assist Stair Management: Two rails, One rail Left, Step to pattern, Alternating pattern, Forwards Number of Stairs: 10 General stair comments: assist for safety at times not clearing step with R foot, cues for step to pattern up, but pt already halfway up,  assist for R hand to reach rail; step to on way down after demo with one rail L and min A    ADL: ADL Overall ADL's : Needs assistance/impaired Grooming: Set up, Standing Upper Body Bathing: Set up, Sitting Lower Body Bathing: Min guard, Sit to/from stand Upper Body Dressing : Supervision/safety, Set up,  Sitting Lower Body Dressing: Min guard, Sit to/from stand Toilet Transfer: Min guard, RW, Ambulation Toileting- Clothing Manipulation and Hygiene: Supervision/safety Functional mobility during ADLs: Min guard, Rolling walker, Cueing for safety General ADL Comments: Educated on need to use showerseat when bathing. Recommend installing grab  bar  Cognition: Cognition Overall Cognitive Status: Impaired/Different from baseline Orientation Level: Oriented X4 Cognition Arousal/Alertness: Awake/alert Behavior During Therapy: WFL for tasks assessed/performed Overall Cognitive Status: Impaired/Different from baseline Area of Impairment: Awareness, Safety/judgement, Problem solving Safety/Judgement: Decreased awareness of deficits, Decreased awareness of safety Awareness: Intellectual Problem Solving: Slow processing, Difficulty sequencing, Requires verbal cues  Blood pressure (!) 157/100, pulse (!) 59, temperature 98.4 F (36.9 C), temperature source Oral, resp. rate 16, height 5\' 6"  (1.676 m), weight 71.6 kg (157 lb 14.4 oz), SpO2 96 %. Physical Exam  Vitals reviewed. Constitutional: She is oriented to person, place, and time. She appears well-developed and well-nourished.  HENT:  Head: Normocephalic.  Surgical scar left neck  Eyes: EOM are normal. Right eye exhibits no discharge. Left eye exhibits no discharge.  Neck: Normal range of motion. Neck supple. No thyromegaly present.  Cardiovascular: Normal rate and regular rhythm.   Respiratory: Effort normal and breath sounds normal. No respiratory distress.  GI: Soft. Bowel sounds are normal. She exhibits no distension.  Musculoskeletal: She exhibits no edema or tenderness.  Neurological: She is alert and oriented to person, place, and time.  Fair awareness of deficits Motor: RUE: shoulder abduction 3-/5, elbow flex/ext 3/5, hand grip 4/5 RLE: 4+/5 proximal to distal LUE/LLE: 5/5 proximal to distal Sensation intact to light touch DTRs symmetric  Skin: Skin is warm and dry.  Left carotid surgical site clean and dry  Psychiatric: She has a normal mood and affect. Her behavior is normal. Thought content normal.    Results for orders placed or performed during the hospital encounter of 10/31/16 (from the past 24 hour(s))  Glucose, capillary     Status: None   Collection  Time: 11/07/16  8:23 AM  Result Value Ref Range   Glucose-Capillary 90 65 - 99 mg/dL   Comment 1 Capillary Specimen    Comment 2 Notify RN   Glucose, capillary     Status: None   Collection Time: 11/07/16 11:32 AM  Result Value Ref Range   Glucose-Capillary 92 65 - 99 mg/dL   Comment 1 Capillary Specimen    Comment 2 Notify RN   Glucose, capillary     Status: Abnormal   Collection Time: 11/07/16  3:34 PM  Result Value Ref Range   Glucose-Capillary 161 (H) 65 - 99 mg/dL   Comment 1 Capillary Specimen    Comment 2 Notify RN   Glucose, capillary     Status: Abnormal   Collection Time: 11/07/16  7:37 PM  Result Value Ref Range   Glucose-Capillary 127 (H) 65 - 99 mg/dL  Glucose, capillary     Status: Abnormal   Collection Time: 11/07/16 11:56 PM  Result Value Ref Range   Glucose-Capillary 112 (H) 65 - 99 mg/dL  Glucose, capillary     Status: Abnormal   Collection Time: 11/08/16  4:12 AM  Result Value Ref Range   Glucose-Capillary 106 (H) 65 - 99 mg/dL   Dg Swallowing Func-speech Pathology  Result Date: 11/06/2016 Objective Swallowing Evaluation: Type of Study: MBS-Modified Barium Swallow Study Patient Details Name: Regina James MRN: [id number] Date of Birth: [birth year] Today's Date: 11/06/2016 Time: SLP Start Time (ACUTE ONLY): 1102-SLP Stop Time (ACUTE ONLY): 1125 SLP  Time Calculation (min) (ACUTE ONLY): 23 min Past Medical History: Past Medical History: Diagnosis Date . Cancer ([place])   basal cell carcinoma . History of PSVT (paroxysmal supraventricular tachycardia)   Reported back as far as 2010. Marland Kitchen Palpitations  . Stroke [facility])  Past Surgical History: Past Surgical History: Procedure Laterality Date . APPENDECTOMY  1993 . CHOLECYSTECTOMY  1996 . ENDARTERECTOMY Left 11/03/2016  Procedure: Exploration of Carotid  ENDARTERECTOMY.;  Surgeon: Elam Dutch, MD;  Location: [place];  Service: Vascular;  Laterality: Left; . ENDARTERECTOMY Left 11/03/2016  Procedure: ENDARTERECTOMY CAROTID;  Surgeon:  Elam Dutch, MD;  Location: [place];  Service: Vascular;  Laterality: Left; . LOOP RECORDER INSERTION N/A 11/02/2016  Procedure: Loop Recorder Insertion;  Surgeon: [name], MD;  Location: [place] [facility];  Service: Cardiovascular;  Laterality: N/A; . ROTATOR CUFF REPAIR Right 1990 . TEE WITHOUT CARDIOVERSION N/A 11/02/2016  Procedure: TRANSESOPHAGEAL ECHOCARDIOGRAM (TEE);  Surgeon: Lelon Perla, MD;  Location: [facility] ENDOSCOPY;  Service: Cardiovascular;  Laterality: N/A; . US ECHOCARDIOGRAPHY  01/09/2009  EF 55-60% . 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 Presented to ED on 7/29 post-fall with right leg weakness. MRI with scattered small acute infarcts in the left cerebral hemisphere. 8/1 underwent CEA, found to have left carotid hematoma and right sided weakness so taken back to OR for evacuation, where a 5 x 5 cm hematoma was evacuated. Post-operatively Emergent head CT without hemorrhage or large vessel occlusion. ETT 8/1-8/3. Per chart, pt passed RN swallow screen on 10/31/16. Screened by ST for cognition/language on 11/01/16 with no recommended f/u for cognitive-linguistic treatment. Subjective: Alert, cooperative Assessment / Plan / Recommendation CHL IP CLINICAL IMPRESSIONS 11/06/2016 Clinical Impression Patient presents with mild pharyngeal dysphagia characterized by mildly reduced hyolaryngeal excursion and incomplete epiglottic deflection, leading to consistent penetration of thin liquids and intermittent mild vallecular residue. Dry swallows adequate to reduce pharyngeal residue, cued throat clear for expelling penetrate.  Epiglottic deflection more complete with heavier boluses, resulting in improved airway protection. Oral phase WFL; cervical esophageal phase of swallowing was unremarkable. Recommend initiating dys 3 with nectar  thick liquids, meds whole in puree, free water protocol (pt may have ice, thin water between meals if within 30 minutes of oral care). Pt with coughing/ throat clearing throughout examination unrelated to laryngeal penetration/residue. SLP will f/u for tolerance, advancement of liquids/solids with or without repeat MBS.  SLP Visit Diagnosis Dysphagia, pharyngeal phase (R13.13) Attention and concentration deficit following -- Frontal lobe and executive function deficit following -- Impact on safety and function Mild aspiration risk   CHL IP TREATMENT RECOMMENDATION 11/06/2016 Treatment Recommendations Therapy as outlined in treatment plan below   Prognosis 11/06/2016 Prognosis for Safe Diet Advancement Good Barriers to Reach Goals -- Barriers/Prognosis Comment -- CHL IP DIET RECOMMENDATION 11/06/2016 SLP Diet Recommendations Dysphagia 3 (Mech soft) solids;Nectar thick liquid Liquid Administration via Cup;Straw Medication Administration Whole meds with puree Compensations Clear throat intermittently;Multiple dry swallows after each bite/sip;Slow rate;Small sips/bites Postural Changes Seated upright at 90 degrees   CHL IP OTHER RECOMMENDATIONS 11/06/2016 Recommended Consults -- Oral Care Recommendations Oral care BID;Other (Comment) Other Recommendations Order thickener from pharmacy   CHL IP FOLLOW UP RECOMMENDATIONS 11/06/2016 Follow up Recommendations Other (comment)   CHL IP FREQUENCY AND DURATION 11/06/2016 Speech Therapy Frequency (ACUTE ONLY) min 2x/week Treatment Duration 2 weeks  CHL IP ORAL PHASE 11/06/2016 Oral Phase WFL Oral - Pudding Teaspoon -- Oral - Pudding Cup -- Oral - Honey Teaspoon -- Oral - Honey Cup -- Oral - Nectar Teaspoon -- Oral - Nectar Cup -- Oral - Nectar Straw -- Oral - Thin Teaspoon -- Oral - Thin Cup -- Oral - Thin Straw -- Oral - Puree -- Oral - Mech Soft -- Oral - Regular -- Oral - Multi-Consistency -- Oral - Pill -- Oral Phase - Comment --  CHL IP PHARYNGEAL PHASE 11/06/2016 Pharyngeal Phase  Impaired Pharyngeal- Pudding Teaspoon -- Pharyngeal -- Pharyngeal- Pudding Cup -- Pharyngeal -- Pharyngeal- Honey Teaspoon -- Pharyngeal -- Pharyngeal- Honey Cup -- Pharyngeal -- Pharyngeal- Nectar Teaspoon -- Pharyngeal -- Pharyngeal- Nectar Cup Reduced epiglottic inversion;Reduced laryngeal elevation;Penetration/Aspiration during swallow Pharyngeal Material enters airway, remains ABOVE vocal cords then ejected out;Material does not enter airway Pharyngeal- Nectar Straw -- Pharyngeal -- Pharyngeal- Thin Teaspoon Reduced epiglottic inversion;Reduced laryngeal elevation;Reduced airway/laryngeal closure;Pharyngeal residue - valleculae Pharyngeal -- Pharyngeal- Thin Cup Reduced epiglottic inversion;Reduced laryngeal elevation;Reduced airway/laryngeal closure;Penetration/Aspiration during swallow;Pharyngeal residue - valleculae Pharyngeal Material enters airway, remains ABOVE vocal cords and not ejected out Pharyngeal- Thin Straw Reduced epiglottic inversion;Reduced airway/laryngeal closure;Reduced laryngeal elevation;Penetration/Aspiration during swallow;Pharyngeal residue - valleculae Pharyngeal Material enters airway, CONTACTS cords and not ejected out Pharyngeal- Puree Reduced epiglottic inversion;Reduced laryngeal elevation;Pharyngeal residue - valleculae Pharyngeal -- Pharyngeal- Mechanical Soft -- Pharyngeal -- Pharyngeal- Regular Reduced epiglottic inversion;Reduced laryngeal elevation;Pharyngeal residue - valleculae Pharyngeal -- Pharyngeal- Multi-consistency -- Pharyngeal -- Pharyngeal- Pill Reduced laryngeal elevation;Reduced epiglottic inversion;Reduced airway/laryngeal closure;Pharyngeal residue - valleculae;Penetration/Aspiration during swallow Pharyngeal Material enters airway, CONTACTS cords and not ejected out;Other (Comment) Pharyngeal Comment --  CHL IP CERVICAL ESOPHAGEAL PHASE 11/06/2016 Cervical Esophageal Phase WFL Pudding Teaspoon -- Pudding Cup -- Honey Teaspoon -- Honey Cup -- Nectar Teaspoon --  Nectar Cup -- Nectar Straw -- Thin Teaspoon -- Thin Cup -- Thin Straw -- Puree -- Mechanical Soft -- Regular -- Multi-consistency -- Pill -- Cervical Esophageal Comment -- Deneise Lever, MS, CCC-SLP Speech-Language Pathologist [phone] No flowsheet data found. Aliene Altes 11/06/2016, 12:05 PM               Assessment/Plan: Diagnosis: Scattered left CVA Labs and images independently reviewed.  Records reviewed and summated above. Stroke: Continue secondary stroke prophylaxis and Risk Factor Modification listed below:   Antiplatelet therapy:   Blood Pressure Management:  Continue current medication with prn's with permisive HTN per primary team Statin Agent:   Prediabetes management:   Tobacco abuse:   Right sided hemiparesis: fit for orthosis to prevent contractures (resting hand splint for day, wrist cock up splint at night) Motor recovery: Fluoxetine  1. Does the need for close, 24 hr/day medical supervision in concert with the patient's rehab needs make it unreasonable for this patient to be served in a less intensive setting? No  2. Co-Morbidities requiring supervision/potential complications: basal cell carcinoma, PSVT (monitor HR with increased physical activity), CVA March 2018 with residual proximal right upper extremity weakness, tobacco abuse (counsel), medical noncompliance (counsel), Dysphagia (advance diet as tolerated), leukocytosis (cont to monitor for signs and symptoms of infection, further workup if indicated), ABLA (transfuse if necessary to ensure appropriate perfusion for increased activity tolerance), tachypnea (monitor RR and O2 Sats with increased physical exertion) 3. Due to safety, skin/wound care, disease management, pain management and patient education, does the patient require 24 hr/day rehab nursing? Yes 4. Does the patient require coordinated care of a physician, rehab nurse, PT (1-2 hrs/day,  5 days/week), OT (1-2 hrs/day, 5 days/week) and SLP (1-2 hrs/day, 5  days/week) to address physical and functional deficits in the context of the above medical diagnosis(es)? Potentially Addressing deficits in the following areas: balance, endurance, locomotion, strength, transferring, bathing, dressing, toileting, swallowing and psychosocial support 5. Can the patient actively participate in an intensive therapy program of at least 3 hrs of therapy per day at least 5 days per week? Yes 6. The potential for patient to make measurable gains while on inpatient rehab is good and fair 7. Anticipated functional outcomes upon discharge from inpatient rehab are n/a  with PT, n/a with OT, n/a with SLP. 8. Estimated rehab length of stay to reach the above functional goals is: NA 9. Anticipated D/C setting: Other 10. Anticipated post D/C treatments: SNF 11. Overall Rehab/Functional Prognosis: good  RECOMMENDATIONS: This patient's condition is appropriate for continued rehabilitative care in the following setting: Pt relatively high functional.  Due to lack of caregiver support at this time, recommond SNF with PM&R follow up. Patient has agreed to participate in recommended program. Yes Note that insurance prior authorization may be required for reimbursement for recommended care.  Comment: Rehab Admissions Coordinator to follow up.  Delice Lesch, MD, Mellody Drown Cathlyn Parsons., PA-C 11/08/2016

## 2016-11-08 NOTE — Progress Notes (Addendum)
STROKE TEAM PROGRESS NOTE       History on Admission Regina James is an 83 y.o. female with a past medical history significant for CVA in March, 2018 with residual proximal RUE weakness, HTN, tobacco abuse, tachyarrhythmia that does not appear to be afib (per cards workup in March) presents with right sided weakness. She states that she woke up around 0400 by a severe right leg cramp and persistent right leg weakness following resolution of the cramp. She states that the RUE weakness has worsened. She denies word trouble speaking and focal neurological deficit otherwise. Regina James states acknowledges that she does not take plavix as prescribed.   Pertinent Imaging: CT head 10/31/16: Patchy left frontal parietal white matter hypointensities more conspicuous than on prior study. MRI 10/31/16: pending formal read CTA head/neck 06/27/16:No LVO. Hypoplastic right vertebral artery. Atheromatous plaque up to 30% on the right and 40-50% on the left carotid bifurcations. Superimposed penetrating atheromatous ulcer at the aortic arch. Narrowing of approximately 40% at the origin of the great vessels. Severe 80% left subclavian artery stenosis as above. MR brain 3/25: Widely scattered but small, mostly cortically based, acute infarcts in the posterior Left MCA and the superior Left PCA territories. Otherwise stable since 2007.  Date last known well: Date: 10/30/2016 Time last known well: Time: 23:00  tPA Given: No: Out of window, no cortical signs   SUBJECTIVE (INTERVAL HISTORY)  She is awake and alert. No complaints today. Appears slightly confused about the role of loop recorder and feels she she no longer needs it as she had carotid surgery. I spoke to her to clarify this issue and have requested electrophysiology team also to speak to her to clarify the need  OBJECTIVE Temp:  [98.4 F (36.9 C)-99.1 F (37.3 C)] 98.5 F (36.9 C) (08/06 1130) Pulse Rate:  [59-82] 71 (08/06 1130) Cardiac  Rhythm: Normal sinus rhythm (08/06 1115) Resp:  [15-25] 25 (08/06 1130) BP: (128-158)/(53-100) 150/84 (08/06 1130) SpO2:  [95 %-100 %] 98 % (08/06 1130) Weight:  [157 lb 14.4 oz (71.6 kg)] 157 lb 14.4 oz (71.6 kg) (08/06 0400)  CBC:   Recent Labs Lab 11/05/16 0526 11/06/16 0307  WBC 15.2* 12.0*  HGB 11.3* 11.1*  HCT 33.9* 33.3*  MCV 97.7 94.6  PLT 280 341    Basic Metabolic Panel:   Recent Labs Lab 11/04/16 0358  11/05/16 1333 11/06/16 0307  NA 142  < > 139 138  K 3.7  < > 3.7 3.6  CL 111  < > 111 108  CO2 23  < > 24 23  GLUCOSE 158*  < > 111* 99  BUN 14  < > 15 10  CREATININE 0.79  < > 0.53 0.55  CALCIUM 8.0*  < > 8.2* 8.2*  MG 1.5*  --   --   --   PHOS 3.9  --   --   --   < > = values in this interval not displayed.  Lipid Panel:     Component Value Date/Time   CHOL 147 11/01/2016 0341   TRIG 71 11/04/2016 0358   HDL 42 11/01/2016 0341   CHOLHDL 3.5 11/01/2016 0341   VLDL 16 11/01/2016 0341   LDLCALC 89 11/01/2016 0341   HgbA1c:  Lab Results  Component Value Date   HGBA1C 5.7 (H) 11/01/2016   Urine Drug Screen:     Component Value Date/Time   LABOPIA NONE DETECTED 10/31/2016 1541   COCAINSCRNUR NONE DETECTED 10/31/2016 1541  LABBENZ NONE DETECTED 10/31/2016 1541   AMPHETMU NONE DETECTED 10/31/2016 1541   THCU NONE DETECTED 10/31/2016 1541   LABBARB NONE DETECTED 10/31/2016 1541    Alcohol Level     Component Value Date/Time   ETH <5 10/31/2016 1055    IMAGING I have personally reviewed the radiological images below and agree with the radiology interpretations.  Ct Head Wo Contrast 10/31/2016 1. Negative for bleed or other acute process.  2. Patchy left frontal parietal white matter hypointensities more conspicuous than on prior study.   Ct Angio Head W Or Wo Contrast Ct Angio Neck W Or Wo Contrast 11/01/2016 1. Penetrating Aortic Ulcer (PAU) with progression since March, now measuring up to 14 mm. See series 9, image 171 and series 12,  image 127.  2. Negative for large vessel occlusion. Mild progression of bulky soft plaque at the left ICA origin since March, with  60% proximal Left ICA stenosis. No other large vessel stenosis.  3. Mild to moderate atherosclerotic irregularity of the left MCA and left greater than right PCA branches.  4. Continued stable non contrast CT appearance of the brain.     Mr Brain Limited Wo Contrast 10/31/2016 1. Scattered small acute infarcts in the left cerebral hemisphere, greatest in the posterior frontal lobe near the vertex.  2. Mild chronic small vessel ischemic disease.   2D Echo 06/28/2016 Study Conclusions - Left ventricle: The cavity size was normal. There was mild concentric hypertrophy. Systolic function was normal. The estimated ejection fraction was in the range of 55% to 60%. Wall motion was normal; there were no regional wall motion abnormalities. Doppler parameters are consistent with abnormal left ventricular relaxation (grade 1 diastolic dysfunction).  Doppler parameters are consistent with high ventricular filling pressure. - Aortic valve: Transvalvular velocity was within the normal range.  There was no stenosis. There was no regurgitation. - Mitral valve: Transvalvular velocity was within the normal range.  There was no evidence for stenosis. There was no regurgitation. - Left atrium: The atrium was moderately dilated. - Right ventricle: The cavity size was normal. Wall thickness was normal. Systolic function was normal. - Atrial septum: No defect or patent foramen ovale was identified. - Tricuspid valve: There was mild regurgitation. - Pulmonary arteries: Systolic pressure was within the normal   range. PA peak pressure: 30 mm Hg (S).   VAS [place] LE 06/28/2016 Summary: - No evidence of deep vein or superficial thrombosis involving the right lower extremity and left lower extremity.  TEE Normal LV function; no LAA thrombus; atrial septal aneurysm; negative saline  microcavitation study.   Ct Angio Head and neck W Or Wo Contrast 11/04/2016 1. Negative CTA for emergent large vessel occlusion.  2. Postoperative changes from recent left carotid endarterectomy without complication. No significant residual stenosis.  3. High-grade approximate 80% stenosis involving the mid left subclavian artery.  4. No other high-grade or critical flow limiting stenosis.  5. Penetrating aortic ulcer or, measuring 15 mm on today's exam, previously 14 mm on 11/01/2016.  6. Small vessel atheromatous irregularity involving the distal left MCA and left greater than right PCA branches bilaterally.    Mr Brain Wo Contrast 11/04/2016 Several new small acute embolic infarcts in the left cerebral hemisphere, predominantly in the posterior frontal lobe near the vertex.    PHYSICAL EXAM  Temp:  [98.4 F (36.9 C)-99.1 F (37.3 C)] 98.5 F (36.9 C) (08/06 1130) Pulse Rate:  [59-82] 71 (08/06 1130) Resp:  [15-25] 25 (08/06 1130) BP: (  128-158)/(53-100) 150/84 (08/06 1130) SpO2:  [95 %-100 %] 98 % (08/06 1130) Weight:  [157 lb 14.4 oz (71.6 kg)] 157 lb 14.4 oz (71.6 kg) (08/06 0400)   Neuro - Awake, oriented fully.  Alert. Face symmetrical.  PERL.  EOMI. Tongue midline. Strength 3/5 RUE and RLE.   5/5 LUE and LLE. Coordination is intact bilaterally. Sensory intact bilaterally.  Gait -deferred.  ASSESSMENT/PLAN Regina James is a 83 y.o. female with history of cancer, history PSVT, palpitations, tobacco abuse, CVA in March, 2018 with residual proximal RUE weakness, HTN, who presented with right sided weakness. She did not receive IV t-PA due to arriving outside of the treatment window/presenting without cortical signs.  Stroke:   1.Scattered small acute infarcts in the left MCA and ACA, likely embolic, due to left ICA soft plaque with stenosis vs. cardioembolic with PSVT and suspicious for afib. 2. Recurrent scattered left MCA infarct s/p left CEA with neck hematoma  after evacuation - likely related to procedure complication of neck hematoma  Resultant  Right hemiparesis  CT head: Patchy left frontal parietal white matter hypointensities more conspicuous than on prior study.  CTA: increased left proximal ICA soft plaque and stenosis now at 60%. No LVO or high-grade stenosis.  14 mm penetrating aortic ulcer.  MRI head 10/31/16: Scattered small acute infarcts in the left cerebral hemisphere, greatest in the posterior frontal lobe near the vertex.   MRI head 11/04/16: Several new small acute embolic infarcts in the left cerebral hemisphere, predominantly in the posterior frontal lobe near the vertex.  2D Echo (06/28/2016): EF 60-65%. No source of embolus.   TEE unremarkable   Loop recorder placed  LDL 89   HgbA1c 5.7  SCDs for VTE prophylaxis  clopidogrel 75 mg daily prior to admission, now on aspirin 325 mg daily and clopidogrel 75 mg daily. Continue DAPT for 3 months and then plavix alone.  Patient counseled to be compliant with her antithrombotic medications  Ongoing aggressive stroke risk factor management  Therapy recommendations:  pending  Disposition:  Pending  Left ICA stenosis with soft plaque s/p CEA  Could be the reason for recurrent left MCA / ACA infarcts  Left CEA on 11/03/16 - evacuation hematoma left carotid, exploration left carotid artery - Dr Oneida Alar - POD # 4  Continue DAPT  Aortic ulceration   Distal to the left CCA take off - unlikely the cause of her stroke  Recommend vascular surgery or CVST consultation for further monitoring and management  Hx of stroke  06/2016 right hand weakness - CTA head negative - CTA head left ICA 40-50% stenosis, aortic ulceration - MRI scattered left MCA infarcts - LDL 95 and A1C 5.6 - changed ASA to plavix and add lipitor - recommend outpt TEE and loop  08/2016 followed up in [place] with Dr. Leta Baptist - arranged TEE and loop but pt did not answer the arrangement with cardiology  Pt also  noncompliant with plavix and lipitor  Hypertension  Unstable  Permissive hypertension (OK if < 220/120) but gradually normalize in 5-7 days  Long-term BP goal normotensive  128/53  Hyperlipidemia  Home meds: atorvastatin 20mg , not taking at home  LDL 89, goal < 70  Will change to pravastatin 20mg  for compliance  Continue statin at discharge   Tobacco abuse  Current smoker  Smoking cessation counseling provided  Nicotine patch provided  Pt is willing to quit  Other Stroke Risk Factors  Advanced age  ETOH use, advised to drink no  more than 1 drink per day   Other Active Problems  Extubated yesterday Friday  NPO per speech evaluation today Saturday  Mild anemia  Mild leukocytosis (afebrile) (currently on no antibiotics)   PLAN  Continue mobilization out of bed and therapy consults. Transfer to inpatient rehabilitation when bed available.  Patient educated on need for loop recorder monitoring for paroxysmal [facility] day # 8   I have personally examined this patient, reviewed notes, independently viewed imaging studies, participated in medical decision making and plan of care.ROS completed by me personally and pertinent positives fully documented  I have made any additions or clarifications directly to the above note.  Continue aspirin for stroke prevention and transferred to inpatient rehabilitation when bed available. Discussed with Dr. Rayann Heman. Greater than 50% and in this 25 minute visit was spent on counseling and coordination of care about her left hemispheric stroke, discussion about paroxysmal to fibrillation detection and answering questions Stroke team will sign off. Follow-up as an outpatient in stroke clinic for Dr. Erlinda Hong in 6 weeks. Antony Contras, MD Medical Director [facility] Stroke [facility] Pager: [phone] 11/08/2016 1:45 PM        To contact Stroke Continuity provider, please refer to [email or web address] After hours, contact General  Neurology

## 2016-11-08 NOTE — Care Management Important Message (Signed)
Important Message  Patient Details  Name: Regina James MRN: [id number] Date of Birth: 1933-04-29   Medicare Important Message Given:  Yes    Brysan Mcevoy Abena 11/08/2016, 9:55 AM

## 2016-11-08 NOTE — Progress Notes (Signed)
Physical Therapy Treatment Patient Details Name: Regina James MRN: [id number] DOB: 05/18/1933 Today's Date: 11/08/2016    History of Present Illness Regina James is an 83 y.o. female with a history of cancer, history PSVT, palpitations, tobacco abuse, CVA in March, 2018 with residual proximal RUE weakness, HTN, who presented with right sided weakness. Scattered small acute infarcts in the left cerebral hemisphere, greatest in the posterior frontal lobe near the vertex. S/p L CEA 8/1 and taken back into surgery to evacuate 5x5 cm hematoma. Vent dependent until extubation 8/3. Latest MRI 8/2 shows new infarcts as compared to 7/29 MRI.     PT Comments    Pt presented awake and alert, willing to participate in session. Patient required minimal cueing for bed mobility. Regina James showed improvements in R UE function, with increased shoulder flexion AROM. She reported feeling more strength on her right side than she had yesterday. She presented more cognitively present with less impairments of judgement, safety and problem solving. She showed evidence of learning and understanding of cues that were given yesterday, including how to turn with a walker. She was able to ambulate on stairs, but required minimum assistance and was impulsive in taking a step prior to listening to instructions. There was a brief moment of loss of stability which required min physical assistance to prevent a fall when she could not descend a step with control of her R LE. After discussion with Rehab Coordinator, due to recent improvements Regina James may not qualify for CIR. Patient is progressing towards her goals of returning to independent; however, requires cues for safe ambulation and stairs prior to being able to returning to living independently in her home. Pt will benefit from continued skilled PT to address gait deficits and strengthening in the acute setting. A d/c plan to a SNF is appropriate to continue to improve  her functional mobility and safety to progress towards independence.   Follow Up Recommendations  SNF     Equipment Recommendations  Rolling walker with 5" wheels    Recommendations for Other Services       Precautions / Restrictions Precautions Precautions: Fall Restrictions Weight Bearing Restrictions: No    Mobility  Bed Mobility Overal bed mobility: Needs Assistance Bed Mobility: Supine to Sit     Supine to sit: Modified independent (Device/Increase time);Supervision     General bed mobility comments: increased time; cues for repositioning   Transfers Overall transfer level: Needs assistance Equipment used: Rolling walker (2 wheeled) Transfers: Sit to/from Stand Sit to Stand: Min guard         General transfer comment: cues for safety, repositioning to get feet flat, and hand placement for standing;  Ambulation/Gait Ambulation/Gait assistance: Min guard   Assistive device: Rolling walker (2 wheeled) Gait Pattern/deviations: Step-through pattern;Wide base of support   Gait velocity interpretation: Below normal speed for age/gender General Gait Details: cues for safety with turns and DME use; cues to self monitor for activity tolerance; decreased R foot clearance     Stairs Stairs: Yes   Stair Management: One rail Left;Step to pattern Number of Stairs: 4 General stair comments: assist for safety; decreased stability when stepping down with LLE, did not have adequate control of RLE to lower body down a step; min assist necessary to prevent a fall   Wheelchair Mobility    Modified Rankin (Stroke Patients Only)       Balance Overall balance assessment: Needs assistance Sitting-balance support: Feet supported;Single extremity supported Sitting balance-Leahy Scale:  Good Sitting balance - Comments: cues needed to place feet flat on the floor    Standing balance support: Single extremity supported Standing balance-Leahy Scale: Fair Standing balance  comment: Able to momentarily stand with 1 UE support to readjust finger pulse ox                             Cognition Arousal/Alertness: Awake/alert Behavior During Therapy: WFL for tasks assessed/performed Overall Cognitive Status: Within Functional Limits for tasks assessed Area of Impairment: Following commands                               General Comments: improved cognition, recall of travel memories, impulsive movements made without listening to instructions      Exercises      General Comments   Improved AROM of R shoulder flexion; patient reports feeling increased strength on her right side today.       Pertinent Vitals/Pain Pain Assessment: 0-10 Pain Score: 2  Pain Location: neck incision site  Pain Descriptors / Indicators: Tender;Aching Pain Intervention(s): Limited activity within patient's tolerance;Monitored during session    Home Living                      Prior Function            PT Goals (current goals can now be found in the care plan section) Acute Rehab PT Goals Patient Stated Goal: To return to independent PT Goal Formulation: With patient Time For Goal Achievement: 11/16/16 Potential to Achieve Goals: Good Progress towards PT goals: Progressing toward goals    Frequency    Min 4X/week      PT Plan      Co-evaluation              AM-PAC PT "6 Clicks" Daily Activity  Outcome Measure  Difficulty turning over in bed (including adjusting bedclothes, sheets and blankets)?: A Little Difficulty moving from lying on back to sitting on the side of the bed? : A Little Difficulty sitting down on and standing up from a chair with arms (e.g., wheelchair, bedside commode, etc,.)?: A Lot Help needed moving to and from a bed to chair (including a wheelchair)?: A Little Help needed walking in hospital room?: A Little Help needed climbing 3-5 steps with a railing? : A Little 6 Click Score: 17    End of  Session Equipment Utilized During Treatment: Gait belt Activity Tolerance: Patient tolerated treatment well Patient left: in chair;with call bell/phone within reach;with family/visitor present Nurse Communication: Other (comment) (O2 sats remained high; improvements from previous session) PT Visit Diagnosis: Unsteadiness on feet (R26.81);Other abnormalities of gait and mobility (R26.89);Muscle weakness (generalized) (M62.81);Hemiplegia and hemiparesis Hemiplegia - Right/Left: Right Hemiplegia - dominant/non-dominant: Dominant Hemiplegia - caused by: Cerebral infarction     Time: 8657-8469 PT Time Calculation (min) (ACUTE ONLY): 31 min  Charges:  $Gait Training: 23-37 mins                    G Codes:       Ave Filter, SPT Acute Rehabilitation Services Office: [phone]   Ave Filter 11/08/2016, 11:17 AM

## 2016-11-09 ENCOUNTER — Encounter: Payer: Self-pay | Admitting: Vascular Surgery

## 2016-11-09 LAB — GLUCOSE, CAPILLARY
Glucose-Capillary: 71 mg/dL (ref 65–99)
Glucose-Capillary: 99 mg/dL (ref 65–99)
Glucose-Capillary: 148 mg/dL — ABNORMAL HIGH (ref 65–99)
Glucose-Capillary: 65 mg/dL (ref 65–99)
Glucose-Capillary: 91 mg/dL (ref 65–99)
Glucose-Capillary: 114 mg/dL — ABNORMAL HIGH (ref 65–99)

## 2016-11-09 MED ORDER — INSULIN ASPART 100 UNIT/ML ~~LOC~~ SOLN
0.0000 [IU] | Freq: Three times a day (TID) | SUBCUTANEOUS | Status: DC
  Administered 2016-11-09: 1 [IU] via SUBCUTANEOUS

## 2016-11-09 MED ORDER — ASPIRIN 325 MG PO TABS: 325.0000 mg | ORAL_TABLET | Freq: Every day | ORAL | Status: DC

## 2016-11-09 MED ORDER — PRAVASTATIN SODIUM 20 MG PO TABS: 20.0000 mg | ORAL_TABLET | Freq: Every day | ORAL | 11 refills | Status: DC

## 2016-11-09 MED ORDER — CLOPIDOGREL BISULFATE 75 MG PO TABS: 75.0000 mg | ORAL_TABLET | Freq: Every day | ORAL | 11 refills | Status: DC

## 2016-11-09 NOTE — Care Management Note (Signed)
Case Management Note Original Note Created Pollie Friar, RN 11/02/2016, 2:12 PM   Patient Details  Name: Regina James MRN: [id number] Date of Birth: 1933-05-06  Subjective/Objective:  Pt admitted with CVA. She is from home with her spouse.                   Action/Plan: Plan is for [facility] tomorrow. Currently PT recommending HHPT and walker. CM following for d/c needs, physician orders.   Expected Discharge Date:                 Expected Discharge Plan:  Skilled Nursing Facility  In-House Referral:  Clinical Social Work  Discharge planning Services  CM Consult  Post Acute Care Choice:    Choice offered to:     DME Arranged:    DME Agency:     HH Arranged:    [place] Agency:     Status of Service:  In process, will continue to follow  If discussed at Long Length of Stay Meetings, dates discussed:  8/7  Discharge Disposition: skilled facility   Additional Comments:  11/09/16- 1020- Laureano Hetzer RN, CM- pt s/p CEA with post op hematoma and new infarcts-  Pt progressing- CIR consulted however pt relatively high functional and CIR feels SNF would be more appropriate plan for discharge.- CSW has been consulted- and is following for SNF placement.   Dahlia Client [place], RN 11/09/2016, 10:27 AM [phone]

## 2016-11-09 NOTE — Progress Notes (Signed)
Occupational Therapy Treatment Patient Details Name: Regina James MRN: [id number] DOB: [birth year] Today's Date: 11/09/2016    History of present illness Regina James is an 83 y.o. female with a history of cancer, history PSVT, palpitations, tobacco abuse, CVA in March, 2018 with residual proximal RUE weakness, HTN, who presented with right sided weakness. Scattered small acute infarcts in the left cerebral hemisphere, greatest in the posterior frontal lobe near the vertex. S/p L CEA 8/1 and taken back into surgery to evacuate 5x5 cm hematoma. Vent dependent until extubation 8/3. Latest MRI 8/2 shows new infarcts as compared to 7/29 MRI.    OT comments  This 83 yo female seen today to initiate a RUE (shoulder) exercise program with therapist resistance and level 1 theraband. Pt also making progress with being at a S level for all aspects of toileting.   Follow Up Recommendations  SNF;Supervision/Assistance - 24 hour    Equipment Recommendations  None recommended by OT       Precautions / Restrictions Precautions Precautions: Fall Restrictions Weight Bearing Restrictions: No       Mobility Bed Mobility Overal bed mobility: Modified Independent                Transfers Overall transfer level: Needs assistance Equipment used: Rolling walker (2 wheeled) Transfers: Sit to/from Stand Sit to Stand: Supervision              Balance Overall balance assessment: Needs assistance Sitting-balance support: No upper extremity supported;Feet supported Sitting balance-Leahy Scale: Good     Standing balance support: No upper extremity supported;During functional activity Standing balance-Leahy Scale: Fair Standing balance comment: standing at sink to wash hands                           ADL either performed or assessed with clinical judgement   ADL Overall ADL's : Needs assistance/impaired                         Toilet Transfer:  Supervision/safety;RW;Regular Toilet;Grab bars   Toileting- Clothing Manipulation and Hygiene: Supervision/safety;Sit to/from stand               Vision Baseline Vision/History: Wears glasses Wears Glasses: Reading only            Cognition Arousal/Alertness: Awake/alert Behavior During Therapy: WFL for tasks assessed/performed Overall Cognitive Status: Within Functional Limits for tasks assessed                                          Exercises  Pt performed 5 reps of right shoulder horizontal abduction, adduction, and extension with level 1 theraband tied to bedrails. As well as 5 reps each of resistance by therapist for shoulder flexion, extension, horizontal abduction/abduction, internal/external rotation, abduction/adduction. Also table glides focusing on moving RUE on table and not moving trunk           Pertinent Vitals/ Pain       Pain Assessment: No/denies pain         Frequency  Min 2X/week        Progress Toward Goals  OT Goals(current goals can now be found in the care plan section)  Progress towards OT goals: Progressing toward goals     Plan Discharge plan needs to be updated  AM-PAC PT "6 Clicks" Daily Activity     Outcome Measure   Help from another person eating meals?: None Help from another person taking care of personal grooming?: A Little Help from another person toileting, which includes using toliet, bedpan, or urinal?: A Little Help from another person bathing (including washing, rinsing, drying)?: A Little Help from another person to put on and taking off regular upper body clothing?: A Little Help from another person to put on and taking off regular lower body clothing?: A Little 6 Click Score: 19    End of Session Equipment Utilized During Treatment: Rolling walker  OT Visit Diagnosis: Unsteadiness on feet (R26.81)   Activity Tolerance Patient tolerated treatment well   Patient Left in chair;with  call bell/phone within reach   Nurse Communication          Time: 0175-1025 OT Time Calculation (min): 33 min  Charges: OT General Charges $OT Visit: 1 Procedure OT Treatments $Self Care/Home Management : 8-22 mins $Therapeutic Exercise: 8-22 mins  [address], OTR/L [phone] 11/09/2016

## 2016-11-09 NOTE — [place] FL2 (Signed)
[place] MEDICAID FL2 LEVEL OF CARE SCREENING TOOL     IDENTIFICATION  Patient Name: Regina James Birthdate: 07/19/1933 Sex: female Admission Date (Current Location): 10/31/2016  [facility] and [place] Number:  Herbalist and Address:  The [place], [place] [address] [place], [place] [address]      Provider Number: [id number]  Attending Physician Name and Address:  Elam Dutch, MD  Relative Name and Phone Number:  Nikkol Pai, [phone]    Current Level of Care: Hospital Recommended Level of Care: [place] Prior Approval Number:    Date Approved/Denied:   PASRR Number: [id number]  Discharge Plan: SNF    Current Diagnoses: Patient Active Problem List   Diagnosis Date Noted  . Right sided weakness   . History of basal cell carcinoma   . PSVT (paroxysmal supraventricular tachycardia) ([place])   . History of CVA with residual deficit   . Medically noncompliant   . Acute blood loss anemia   . Dysphagia, post-stroke   . Leukocytosis   . Tachypnea   . Mixed dyslipidemia 11/01/2016  . Essential hypertension 11/01/2016  . Tachyarrhythmia 11/01/2016  . Internal carotid artery stenosis, left 11/01/2016  . 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 Stroke ([place]) 10/31/2016  . Right arm weakness 06/27/2016  . Right arm numbness 06/27/2016  . Deviated septum 06/27/2016  . Disorder of ethmoidal sinus 06/27/2016  . Acute ischemic stroke ([place])   . Dysarthria   . Paroxysmal SVT (supraventricular tachycardia) ([place]) 05/21/2016  . Angiopathy, peripheral ([place]) 01/07/2015  . Cloudy posterior capsule 06/01/2013  . Pseudoaphakia 05/02/2013  . Malaise and fatigue 02/16/2013  . Cataract, nuclear 01/28/2012  . Palpitations 02/17/2011  . Claudication ([place]) 02/17/2011  . Tobacco abuse 02/17/2011    Orientation RESPIRATION BLADDER Height & Weight     Self, Time, Situation, Place    Continent Weight: 157 lb 12.8 oz (71.6  kg) Height:  5\' 6"  (167.6 cm)  BEHAVIORAL SYMPTOMS/MOOD NEUROLOGICAL BOWEL NUTRITION STATUS      Continent Diet (regular)  AMBULATORY STATUS COMMUNICATION OF NEEDS Skin   Limited Assist Verbally Normal                       Personal Care Assistance Level of Assistance  Bathing, Feeding, Dressing Bathing Assistance: Limited assistance Feeding assistance: Independent Dressing Assistance: Limited assistance     Functional Limitations Info  Sight, Hearing, Speech Sight Info: Adequate Hearing Info: Adequate Speech Info: Adequate    SPECIAL CARE FACTORS FREQUENCY  PT (By licensed PT), OT (By licensed OT)     PT Frequency: 5x wk OT Frequency: 5x wk            Contractures Contractures Info: Not present    Additional Factors Info  Allergies Code Status Info: Full Code Allergies Info: MORPHINE AND RELATED, LEVOFLOXACIN           Current Medications (11/09/2016):  This is the current hospital active medication list Current Facility-Administered Medications  Medication Dose Route Frequency Provider Last Rate Last Dose  . 0.9 %  sodium chloride infusion  250 mL Intravenous PRN Omar Person, NP      . acetaminophen (TYLENOL) tablet 650 mg  650 mg Oral Q4H PRN Elwin Mocha, MD       Or  . acetaminophen (TYLENOL) solution 650 mg  650 mg Per Tube Q4H PRN Elwin Mocha, MD       Or  .  acetaminophen (TYLENOL) suppository 650 mg  650 mg Rectal Q4H PRN Elwin Mocha, MD      . aspirin tablet 325 mg  325 mg Oral Daily Elam Dutch, MD   325 mg at 11/09/16 1007  . clopidogrel (PLAVIX) tablet 75 mg  75 mg Oral Daily Elam Dutch, MD   75 mg at 11/09/16 1007  . docusate sodium (COLACE) capsule 100 mg  100 mg Oral Daily Elam Dutch, MD   100 mg at 11/09/16 1008  . food thickener (THICK IT) powder   Oral PRN Jennelle Human B, NP      . hydrALAZINE (APRESOLINE) injection 10-20 mg  10-20 mg Intravenous Q6H PRN Jennelle Human B, NP      . insulin aspart  (novoLOG) injection 0-9 Units  0-9 Units Subcutaneous TID WC Rhyne, Samantha J, PA-C      . LORazepam (ATIVAN) injection 1 mg  1 mg Intravenous Q6H PRN Angelia Mould, MD   1 mg at 11/06/16 0235  . magnesium sulfate IVPB 2 g 50 mL  2 g Intravenous Daily PRN Rhyne, Samantha J, PA-C      . MEDLINE mouth rinse  15 mL Mouth Rinse BID Elam Dutch, MD   15 mL at 11/08/16 2256  . metoprolol succinate (TOPROL-XL) 24 hr tablet 50 mg  50 mg Oral Daily Angelia Mould, MD   50 mg at 11/09/16 1007  . multivitamin with minerals tablet 1 tablet  1 tablet Oral Daily Elam Dutch, MD   1 tablet at 11/09/16 1008  . nicotine (NICODERM CQ - dosed in mg/24 hours) patch 14 mg  14 mg Transdermal Daily Rise Patience, MD   14 mg at 11/09/16 1009  . ondansetron (ZOFRAN) injection 4 mg  4 mg Intravenous Q6H PRN Rhyne, Samantha J, PA-C   4 mg at 11/04/16 1523  . pantoprazole (PROTONIX) EC tablet 40 mg  40 mg Oral Daily Lyndee Leo, RPH   40 mg at 11/09/16 1009  . pravastatin (PRAVACHOL) tablet 20 mg  20 mg Oral q1800 [name], MD   20 mg at 11/08/16 1811  . senna-docusate (Senokot-S) tablet 1 tablet  1 tablet Oral QHS PRN Elwin Mocha, MD         Discharge Medications: Please see discharge summary for a list of discharge medications.  Relevant Imaging Results:  Relevant Lab Results:   Additional Information [id number]  Wende Neighbors, LCSW

## 2016-11-09 NOTE — Discharge Instructions (Signed)
[facility]  Discharge Instructions   Carotid Endarterectomy (CEA)  Please refer to the following instructions for your post-procedure care. Your surgeon or physician assistant will discuss any changes with you.  Activity  You are encouraged to walk as much as you can. You can slowly return to normal activities but must avoid strenuous activity and heavy lifting until your doctor tell you it's OK. Avoid activities such as vacuuming or swinging a golf club. You can drive after one week if you are comfortable and you are no longer taking prescription pain medications. It is normal to feel tired for serval weeks after your surgery. It is also normal to have difficulty with sleep habits, eating, and bowel movements after surgery. These will go away with time.  Bathing/Showering  You may shower after you go home. Do not soak in a bathtub, hot tub, or swim until the incision heals completely.  Incision Care  Shower every day. Clean your incision with mild soap and water. Pat the area dry with a clean towel. You do not need a bandage unless otherwise instructed. Do not apply any ointments or creams to your incision. You may have skin glue on your incision. Do not peel it off. It will come off on its own in about one week. Your incision may feel thickened and raised for several weeks after your surgery. This is normal and the skin will soften over time. For Men Only: It's OK to shave around the incision but do not shave the incision itself for 2 weeks. It is common to have numbness under your chin that could last for several months.  Diet  Resume your normal diet. There are no special food restrictions following this procedure. A low fat/low cholesterol diet is recommended for all patients with vascular disease. In order to heal from your surgery, it is CRITICAL to get adequate nutrition. Your body requires vitamins, minerals, and protein. Vegetables are the best  source of vitamins and minerals. Vegetables also provide the perfect balance of protein. Processed food has little nutritional value, so try to avoid this.  Medications  Resume taking all of your medications unless your doctor or physician assistant tells you not to. If your incision is causing pain, you may take over-the- counter pain relievers such as acetaminophen (Tylenol). If you were prescribed a stronger pain medication, please be aware these medications can cause nausea and constipation. Prevent nausea by taking the medication with a snack or meal. Avoid constipation by drinking plenty of fluids and eating foods with a high amount of fiber, such as fruits, vegetables, and grains. Do not take Tylenol if you are taking prescription pain medications.  Follow Up  Our office will schedule a follow up appointment 2-3 weeks following discharge.  Please call us immediately for any of the following conditions  Increased pain, redness, drainage (pus) from your incision site. Fever of 101 degrees or higher. If you should develop stroke (slurred speech, difficulty swallowing, weakness on one side of your body, loss of vision) you should call 911 and go to the nearest emergency room.  Reduce your risk of vascular disease:  Stop smoking. If you would like help call [facility] at 1-800-QUIT-NOW (515) 337-6783) or [place] at 6198766276. Manage your cholesterol Maintain a desired weight Control your diabetes Keep your blood pressure down  If you have any questions, please call the office at [phone].  Loop Recorder  Keep incision clean and dry for 3 days.  You can remove outer dressing tomorrow. Leave steri-strips (little pieces of tape) on until seen in the office for wound check appointment. Call the office [phone]) for redness, drainage, swelling, or fever.

## 2016-11-09 NOTE — Progress Notes (Signed)
OT Evaluation   11/02/16 1400  OT Visit Information  Last OT Received On 11/02/16  Assistance Needed +1  Reason Eval/Treat Not Completed (At CT. Will attempt to reutrn later today.)  History of Present Illness Regina James is an 83 y.o. female with a history of cancer, history PSVT, palpitations, tobacco abuse, CVA in March, 2018 with residual proximal RUE weakness, HTN, who presented with right sided weakness.  Scattered small acute infarcts in the left cerebral hemisphere, greatest in the posterior frontal lobe near the vertex  Precautions  Precautions Fall  Precaution Comments decreased R side awarness  Restrictions  Weight Bearing Restrictions No  Home Living  Family/patient expects to be discharged to: Private residence  Living Arrangements Spouse/significant other  Available Help at Discharge Family;Available 24 hours/day  Type of Home House  Home Access Stairs to enter  Entrance Stairs-Number of Steps 3 to sidewalk, 2 to front door  Entrance Stairs-Rails Left;Right;Can reach both  Home Layout Two level;Bed/bath upstairs  Alternate Level Stairs-Number of Steps 8, a landing, then 4 more  Alternate Level Stairs-Rails Left  Bathroom Shower/Tub Walk-in shower  Bathroom Toilet Handicapped height  Architectural technologist (unsure)  Home Equipment Shower seat - built in;Cane - single point  Prior Function  Level of Independence Independent  Comments just trouble with handwriting (was playing golf - limited recently by back pain)  Communication  Communication No difficulties  Cognition  Arousal/Alertness Awake/alert  Behavior During Therapy WFL for tasks assessed/performed  Overall Cognitive Status No family/caregiver present to determine baseline cognitive functioning  Upper Extremity Assessment  Upper Extremity Assessment RUE deficits/detail  RUE Deficits / Details weakness throughout RUE. poor in hand manipulation skills. Using functionally, but not at baseine. Function  decrease wiht fatigue  RUE Coordination decreased fine motor;decreased gross motor  Lower Extremity Assessment  Lower Extremity Assessment Defer to PT evaluation  RLE Deficits / Details AROM WFL, strength hip flexion 3-/5, knee extension 4/5, ankle DF 4-/5; coordination decreased with toe tapping as compared to L  RLE Sensation decreased proprioception (noted in function)  Cervical / Trunk Assessment  Cervical / Trunk Assessment Normal  ADL  Overall ADL's  Needs assistance/impaired  Grooming Set up;Standing  Upper Body Bathing Set up;Sitting  Lower Body Bathing Min guard;Sit to/from stand  Upper Body Dressing  Supervision/safety;Set up;Sitting  Lower Body Dressing Min guard;Sit to/from Environmental education officer guard;RW;Ambulation  Toileting- Water quality scientist and Hygiene Supervision/safety  Functional mobility during ADLs Min guard;Rolling walker;Cueing for safety  General ADL Comments Educated on need to use showerseat when bathing. Recommend installing grab bar  Vision- History  Baseline Vision/History Wears glasses  Wears Glasses Reading only  Vision- Assessment  Vision Assessment? Yes  Eye Alignment WFL  Ocular Range of Motion [facility]  Alignment/Gaze Preference WDL  Tracking/Visual Pursuits Decreased smoothness of horizontal tracking  Saccades Additional eye shifts occurred during testing  Convergence [facility]  Visual Fields Other (comment) (will further assess. Decreased accuracy wth targets in L vis)  Praxis  Praxis tested? WFL  Bed Mobility  Overal bed mobility Needs Assistance  Bed Mobility Supine to Sit;Sit to Supine  Supine to sit HOB elevated;Supervision  Sit to supine Supervision  General bed mobility comments increased time; assist for safety  Transfers  Overall transfer level Needs assistance  Equipment used Rolling walker (2 wheeled)  Transfers Sit to/from Stand  Sit to Stand Min guard  General transfer comment for safety  Balance  Overall balance  assessment Needs assistance  Sitting balance-Leahy  Scale Good  Standing balance support Bilateral upper extremity supported  Standing balance-Leahy Scale Poor  Standing balance comment UE support for balance  OT - End of Session  Equipment Utilized During Treatment Gait belt;Rolling walker  Activity Tolerance Patient tolerated treatment well  Patient left in bed;with call bell/phone within reach;with bed alarm set  Nurse Communication Mobility status  OT Assessment  OT Recommendation/Assessment Patient needs continued OT Services  OT Visit Diagnosis Other abnormalities of gait and mobility (R26.89);Muscle weakness (generalized) (M62.81);Other symptoms and signs involving cognitive function  OT Problem List Decreased strength;Decreased range of motion;Decreased activity tolerance;Impaired balance (sitting and/or standing);Decreased coordination;Decreased cognition;Decreased safety awareness;Decreased knowledge of use of DME or AE;Impaired UE functional use;Pain  OT Plan  OT Frequency (ACUTE ONLY) Min 2X/week  OT Treatment/Interventions (ACUTE ONLY) Self-care/ADL training;DME and/or AE instruction;Therapeutic activities;Visual/perceptual remediation/compensation;Patient/family education;Cognitive remediation/compensation;Balance training  AM-PAC OT "6 Clicks" Daily Activity Outcome Measure  Help from another person eating meals? 3  Help from another person taking care of personal grooming? 3  Help from another person toileting, which includes using toliet, bedpan, or urinal? 3  Help from another person bathing (including washing, rinsing, drying)? 3  Help from another person to put on and taking off regular upper body clothing? 3  Help from another person to put on and taking off regular lower body clothing? 3  6 Click Score 18  ADL G Code Conversion CK  OT Recommendation  Follow Up Recommendations Home health OT;Supervision/Assistance - 24 hour (initially)  OT Equipment None recommended  by OT  Individuals Consulted  Consulted and Agree with Results and Recommendations Patient  Acute Rehab OT Goals  Patient Stated Goal To return to independent  OT Goal Formulation With patient  Time For Goal Achievement 11/16/16  Potential to Achieve Goals Good  OT Time Calculation  OT Start Time (ACUTE ONLY) 1334  OT Stop Time (ACUTE ONLY) 1359  OT Time Calculation (min) 25 min  OT General Charges  $OT Visit 1 Procedure  OT Evaluation  $OT Eval Moderate Complexity 1 Procedure  OT Treatments  $Self Care/Home Management  8-22 mins  Written Expression  Dominant Hand Right  [facility], OT/L  [phone] 11/02/2016

## 2016-11-09 NOTE — Progress Notes (Signed)
Rehab admissions - Please see rehab consult recommending SNF placement.  I received a call from PT yesterday saying that patient was doing well walking more than 200 feet and that patient was more appropriate now for SNF.  Patient is already at [facility] assist.  Agree with need for SNF.  Call me for questions.  #[phone]

## 2016-11-09 NOTE — Progress Notes (Signed)
  Speech Language Pathology Treatment: Dysphagia  Patient Details Name: Regina James MRN: [id number] DOB: 08-13-1933 Today's Date: 11/09/2016 Time: 4287-6811 SLP Time Calculation (min) (ACUTE ONLY): 13 min  Assessment / Plan / Recommendation Clinical Impression  F/u after Saturday's MBS - pt with no further clinical symptoms of dysphagia.  Tolerated regular solids, thin liquids without deficit.  Passed three oz water test; voice mildly hoarse but with good volume, no cough, no concerns despite large, mixed consistency boluses.  Dysphagia appears to have resolved.  Diet advanced to regular with thin liquids.  D/W pt, who agrees.  No further SLP f/u needed - our services will sign off.   HPI HPI: 83 year old female with PMH of Skin CA, CVA (scattered left MCA infarcts), left subclavian artery stenosis and aortic arch aneurysm with large vessel vasculopathy, Paroxysmal SVT. Presented to ED on 7/29 post-fall with right leg weakness. MRI with scattered small acute infarcts in the left cerebral hemisphere. 8/1 underwent CEA, found to have left carotid hematoma and right sided weakness so taken back to OR for evacuation, where a 5 x 5 cm hematoma was evacuated. Post-operatively Emergent head CT without hemorrhage or large vessel occlusion. ETT 8/1-8/3. Per chart, pt passed RN swallow screen on 10/31/16. Screened by ST for cognition/language on 11/01/16 with no recommended f/u for cognitive-linguistic treatment.      SLP Plan  All goals met       Recommendations  Diet recommendations: Regular;Thin liquid Medication Administration: Whole meds with liquid Supervision: Patient able to self feed                Oral Care Recommendations: Oral care BID SLP Visit Diagnosis: Dysphagia, pharyngeal phase (R13.13) Plan: All goals met       GO                Regina James 11/09/2016, 8:38 AM

## 2016-11-09 NOTE — Progress Notes (Addendum)
  Progress Note    11/09/2016 7:45 AM 6 Days Post-Op  Subjective:  Sitting up; no complaints  afebrile HR 60's-80's NSR 128'N-867'E systolic 72% RA  Vitals:   11/08/16 2354 11/09/16 0403  BP: (!) 160/83 (!) 153/82  Pulse: 76 74  Resp: 18 17  Temp: 98.4 F (36.9 C) 98.2 F (36.8 C)     Physical Exam: Neuro:  Bilateral hand grips almost equal with the right being slightly diminished; right leg weaker than left but appears a little stronger today; tongue is midline. Lungs:  Non labored Incision:  Clean and dry with mild swelling at incision.  CBC    Component Value Date/Time   WBC 12.0 (H) 11/06/2016 0307   RBC 3.52 (L) 11/06/2016 0307   HGB 11.1 (L) 11/06/2016 0307   HCT 33.3 (L) 11/06/2016 0307   PLT 294 11/06/2016 0307   MCV 94.6 11/06/2016 0307   MCH 31.5 11/06/2016 0307   MCHC 33.3 11/06/2016 0307   RDW 15.2 11/06/2016 0307   LYMPHSABS 2.5 11/01/2016 0341   MONOABS 0.7 11/01/2016 0341   EOSABS 0.2 11/01/2016 0341   BASOSABS 0.1 11/01/2016 0341    BMET    Component Value Date/Time   NA 138 11/06/2016 0307   K 3.6 11/06/2016 0307   CL 108 11/06/2016 0307   CO2 23 11/06/2016 0307   GLUCOSE 99 11/06/2016 0307   BUN 10 11/06/2016 0307   CREATININE 0.55 11/06/2016 0307   CALCIUM 8.2 (L) 11/06/2016 0307   GFRNONAA >60 11/06/2016 0307   GFRAA >60 11/06/2016 0307     Intake/Output Summary (Last 24 hours) at 11/09/16 0745 Last data filed at 11/09/16 0408  Gross per 24 hour  Intake              690 ml  Output                0 ml  Net              690 ml     Assessment/Plan:  This is a 83 y.o. female who is s/p left CEA 6 Days Post-Op  -pt is doing well this am. -right sided weakness appears to be improving -pt evaluated by CIR-recommending SNF.  Will get social work consult to start process -continue mobilizing. -per neuro- aortic ulceration distal to the left CCA take off-unlikely cause of stroke but will need further monitoring/management.  Most  likely pt is not going to be a candidate for repair of this.  I have reviewed the CT findings with Dr Servando Snare from Cardiothoracic surgery.  We will schedule the patient for a follow up CT in 1 year.  -plavix/statin  -f/u with Dr. Erlinda Hong in 6 weeks.   Leontine Locket, PA-C [facility] [phone]   Right side still improving daily Nodule right forearm and left forearm non pulsatile probably prior IV or needle sticks warm compress PRN Left neck swelling mild unchanged Awaiting SNF  Ruta Hinds, MD [facility] [place] Office: [phone] Pager: [phone]

## 2016-11-09 NOTE — Clinical Social Work Note (Signed)
Clinical Social Work Assessment  Patient Details  Name: Regina James MRN: [id number] Date of Birth: 07-08-1933  Date of referral:  11/09/16               Reason for consult:  Facility Placement, Discharge Planning                Permission sought to share information with:  Family Supports Permission granted to share information::  Yes, Verbal Permission Granted  Name::     [place]::  snf  Relationship::  spouse  Contact Information:  [phone]  Housing/Transportation Living arrangements for the past 2 months:  Single Family Home Source of Information:  Patient Patient Interpreter Needed:  None Criminal Activity/Legal Involvement Pertinent to Current Situation/Hospitalization:  No - Comment as needed Significant Relationships:  Spouse, Other Family Members Lives with:  Spouse Do you feel safe going back to the place where you live?  Yes Need for family participation in patient care:  No (Coment)  Care giving concerns:  No family at bedside. Patient stated she lives at home with spouse and has support from other family members  Social Worker assessment / plan:  Clinical Social Worker met patient at bedside yesterday to discuss SNF placement as a back up to CIR. Patient stated she is agreeable to discharge to SNF if CIR is unable to take her. CSW to complete necessary paperwork and initiate SNF search on patient behalf.  Employment status:    Insurance information:  Medicare PT Recommendations:  [place] / Referral to community resources:  [place]  Patient/Family's Response to care:  Patient appreciative of CSW role in care  Patient/Family's Understanding of and Emotional Response to Diagnosis, Current Treatment, and Prognosis:  Patient with good understanding of recent hospitalization. Patient agreeable to discharge to SNF  Emotional Assessment Appearance:  Appears stated age Attitude/Demeanor/Rapport:   Other Affect (typically observed):  Appropriate, Pleasant Orientation:  Oriented to  Time, Oriented to Self, Oriented to Situation Alcohol / Substance use:  Not Applicable Psych involvement (Current and /or in the community):  No (Comment)  Discharge Needs  Concerns to be addressed:  No discharge needs identified Readmission within the last 30 days:  No Current discharge risk:  None Barriers to Discharge:  No Barriers Identified   Wende Neighbors, LCSW 11/09/2016, 10:42 AM

## 2016-11-09 NOTE — Discharge Summary (Signed)
[facility] Discharge Summary  Regina James 11/01/1933 83 y.o. female  [id number]  Admission Date: 10/31/2016  Discharge Date: 11/10/2016  Physician: Elam Dutch, MD  Admission Diagnosis: Stroke [facility]) [I63.9] Cerebrovascular accident (CVA) due to other mechanism [facility]) [I63.8]  HPI:   This is a 83 y.o. female who was admitted secondary to a fall and is being worked up for possible stroke. Now has residual weakness in right lower extremity. Right upper extremity and speech not affected   She was last seen in our office 08/26/2016 and previously seen for claudication.  S/p stroke with right UE weakness.  Previous carotid ultrasound showed <40% ICA bilaterally with 80% left subclavian artery stenosis distal to the left vertebral artery.  The left subclavian was asymptomatic Prior CTA also showed a 4.6 cm ascending aortic aneurysm.   She was scheduled for a follow up ultrasound of the ascending aortic aneurysm.    The patient had a stroke March 25. Her symptoms were right arm weakness and some residual right hand clumsiness. She is currently on Plavix and a statin. She had not been on aspirin prior to the event. She also has a history of cardiac arrhythmia. She is scheduled tomorrow for a transesophageal echo. She had never had a prior stroke.   States that she is going to quit smoking.   Hospital Course:  The patient was admitted to the hospital on 10/31/2016. The following studies were obtained. Neurology was consulted.   CT head 10/31/2016 showed patchy left frontal parietal white matter hypointensities more conspicuous than on prior study.    Ct Angio Neck W Or Wo Contrast 11/01/2016 IMPRESSION: 1. Penetrating Aortic Ulcer (PAU) with progression since March, now measuring up to 14 mm. See series 9, image 171 and series 12, image 127. 2. Negative for large vessel occlusion. Mild progression of bulky soft plaque at the left ICA origin since March, with 60%  proximal Left ICA stenosis. No other large vessel stenosis. 3. Mild to moderate atherosclerotic irregularity of the left MCA and left greater than right PCA branches. 4. Continued stable non contrast CT appearance of the brain.     Mr Brain Limited Wo Contrast 10/31/2016 IMPRESSION: 1. Scattered small acute infarcts in the left cerebral hemisphere, greatest in the posterior frontal lobe near the vertex. 2. Mild chronic small vessel ischemic disease.  Neurology felt that her acute infarcts were likely embolic due to left ICA soft plaque with stenosis versus cardioembolic with PSVT suspicious for atrial fibrillation. TEE ordered showing normal LV function, no LAA thrombus, atrial septal aneurysm and negative microcavitation study. . Electrophysiology consulted for loop recorder. She was continued on plavix 75 mg daily with 325 mg aspirin. Vascular surgery was consulted. Dr. Oneida Alar evaluated patient and felt that it was difficult to know if arrhythmia or carotid source was etiology of stroke. However,,carotid endarterectomy would be beneficial.   She was taken to the operating room on 11/04/2016 and underwent left carotid endarterectomy.  The patient tolerated the procedure well and was transported to the PACU in stable condition. On arrival in the PACU, the patient felt not "feeling right." She had 3/5 strength right hand arm and leg.  She also had a hematoma in the left neck. She was taken back to the OR for exploration and evacuation of left neck hematoma. Operative findings: #1 no evidence of thrombus dissection or embolic debris within the carotid #2 diffuse needle hole bleeding most likely secondary to Plavix. She was then taken for  stat CT while still intubated. This showed no hemorrhage or large vessel occlusion. She was not a candidate for tpa given surgery. MRI was ordered showing scattered left MCA infarcts. CCM was consulted for vent management.   The patient remained sedated on the vent on POD 1.  Her neck was without hematoma and JP drain discontinued.  She was extubated on 11/05/16 (POD 2). Her right upper and lower extremity strength were much improved. She had 4/5 right upper grip, but 2/5 otherwise. Her right lower extremity was 2-3/5 and left upper and lower extremities were 5/5 strength. She was stable for transfer to telemetry on POD 3.   SLP evaluated her on 11/06/16 and the patient underwent MBS. She was recommended a dysphagia diet. The patient had intermittent HR up to 160s on 11/06/16. EKG showed ST with PVCs and frequent PACs. BP was stable and she was asymptomatic.   Her right sided weakness continued to improve with the remainder of her hospitalization. She had mild right neck swelling that was stable. PT/OT recommending SNF. She was advanced to a regular diet on 11/09/16. Her aortic ulceration distal to the left CCA was reviewed with cardiothoracic surgeon Dr. Servando Snare who felt she was not a candidate for repair of this. She will have a follow-up CT in one year.   She was stable for d/c to SNF on 11/10/2016  Discharge Instructions:   The patient is discharged to SNF with extensive instructions on wound care and progressive ambulation.  They are instructed not to drive or perform any heavy lifting until returning to see the physician in his office.  Discharge Instructions    CAROTID Sugery: Call MD for difficulty swallowing or speaking; weakness in arms or legs that is a new symtom; severe headache.  If you have increased swelling in the neck and/or  are having difficulty breathing, CALL [phone]    Complete by:  As directed    Call MD for:  redness, tenderness, or signs of infection (pain, swelling, bleeding, redness, odor or green/yellow discharge around incision site)    Complete by:  As directed    Call MD for:  severe or increased pain, loss or decreased feeling  in affected limb(s)    Complete by:  As directed    Call MD for:  temperature >100.5    Complete by:  As directed       Discharge Diagnosis:  Stroke [facility]) [I63.9] Cerebrovascular accident (CVA) due to other mechanism [facility]) [I63.8]  Secondary Diagnosis: Patient Active Problem List   Diagnosis Date Noted  . Right sided weakness   . History of basal cell carcinoma   . PSVT (paroxysmal supraventricular tachycardia) ([place])   . History of CVA with residual deficit   . Medically noncompliant   . Acute blood loss anemia   . Dysphagia, post-stroke   . Leukocytosis   . Tachypnea   . Mixed dyslipidemia 11/01/2016  . Essential hypertension 11/01/2016  . Tachyarrhythmia 11/01/2016  . Internal carotid artery stenosis, left 11/01/2016  . Aortic arch aneurysm ([place]) 11/01/2016  . Stroke ([place]) 10/31/2016  . Right arm weakness 06/27/2016  . Right arm numbness 06/27/2016  . Deviated septum 06/27/2016  . Disorder of ethmoidal sinus 06/27/2016  . Acute ischemic stroke ([place])   . Dysarthria   . Paroxysmal SVT (supraventricular tachycardia) ([place]) 05/21/2016  . Angiopathy, peripheral ([place]) 01/07/2015  . Cloudy posterior capsule 06/01/2013  . Pseudoaphakia 05/02/2013  . Malaise and fatigue 02/16/2013  . Cataract, nuclear 01/28/2012  . Palpitations  02/17/2011  . Claudication ([place]) 02/17/2011  . Tobacco abuse 02/17/2011   Past Medical History:  Diagnosis Date  . Cancer ([place])    basal cell carcinoma  . History of PSVT (paroxysmal supraventricular tachycardia)    Reported back as far as 2010.  Marland Kitchen Palpitations   . Stroke [facility])     Allergies as of 11/09/2016      Reactions   Morphine And Related Swelling   Levofloxacin Anxiety, Other (See Comments)   Double vision      Medication List    STOP taking these medications   atorvastatin 20 MG tablet Commonly known as:  LIPITOR     TAKE these medications   aspirin 325 MG tablet Take 1 tablet (325 mg total) by mouth daily.   clopidogrel 75 MG tablet Commonly known as:  PLAVIX Take 1 tablet (75 mg total) by mouth daily. What changed:  See the new  instructions.   metoprolol succinate 100 MG 24 hr tablet Commonly known as:  TOPROL-XL Take  0.5 tablet ( 50 mg total) once daily   multivitamin tablet Take 1 tablet by mouth daily.   pravastatin 20 MG tablet Commonly known as:  PRAVACHOL Take 1 tablet (20 mg total) by mouth daily at 6 PM.   SLEEP AID PO Take 1 tablet by mouth at bedtime.   SYSTANE OP Place 1 drop into both eyes daily as needed (for dry eyes).       Disposition: SNF  Patient's condition: is Good  Follow up: 1. Dr.  Oneida Alar in 2 weeks. 2. [facility] on 11/11/16 3. Neurology (Dr. Erlinda Hong) in 6 weeks   Virgina Jock, PA-C [facility] [phone]  --- For [facility] use --- Instructions: Press F2 to tab through selections.  Delete question if not applicable.   Modified Rankin score at D/C (0-6): 1  IV medication needed for:  1. Hypertension: No 2. Hypotension: Yes  Post-op Complications: Yes  1. Post-op CVA or TIA: Yes  If yes: Event classification (right eye, left eye, right cortical, left cortical, verterobasilar, other): left cortical  If yes: Timing of event (intra-op, <6 hrs post-op, >=6 hrs post-op, unknown): intra-op  2. CN injury: No  3. Myocardial infarction: No  4.  CHF: No  5.  Dysrhythmia (new): No  6. Wound infection: No  7. Reperfusion symptoms: No  8. Return to OR: Yes  If yes: return to OR for (bleeding, neurologic, other CEA incision, other): Bleeding, neurologic  Discharge medications: Statin use:  Yes If No: [ ]  For Medical reasons, [ ]  Non-compliant, [ ]  Not-indicated ASA use:  Yes  If No: [ ]  For Medical reasons, [ ]  Non-compliant, [ ]  Not-indicated Beta blocker use:  Yes If No: [ ]  For Medical reasons, [ ]  Non-compliant, [ ]  Not-indicated ACE-Inhibitor use:  No If No: [ ]  For Medical reasons, [ ]  Non-compliant, [ ]  Not-indicated P2Y12 Antagonist use: Yes, [x]  Plavix, [ ]  Plasugrel, [ ]  Ticlopinine, [ ]  Ticagrelor, [ ]  Other, [ ]  No for  medical reason, [ ]  Non-compliant, [ ]  Not-indicated Anti-coagulant use:  No, [ ]  Warfarin, [ ]  Rivaroxaban, [ ]  Dabigatran, [ ]  Other, [ ]  No for medical reason, [ ]  Non-compliant, [ ]  Not-indicated

## 2016-11-10 DIAGNOSIS — I10 Essential (primary) hypertension: Secondary | ICD-10-CM | POA: Diagnosis not present

## 2016-11-10 DIAGNOSIS — I6522 Occlusion and stenosis of left carotid artery: Secondary | ICD-10-CM | POA: Diagnosis not present

## 2016-11-10 DIAGNOSIS — I69351 Hemiplegia and hemiparesis following cerebral infarction affecting right dominant side: Secondary | ICD-10-CM | POA: Diagnosis not present

## 2016-11-10 DIAGNOSIS — M6281 Muscle weakness (generalized): Secondary | ICD-10-CM | POA: Diagnosis not present

## 2016-11-10 DIAGNOSIS — I639 Cerebral infarction, unspecified: Secondary | ICD-10-CM | POA: Diagnosis not present

## 2016-11-10 DIAGNOSIS — R52 Pain, unspecified: Secondary | ICD-10-CM | POA: Diagnosis not present

## 2016-11-10 DIAGNOSIS — K5900 Constipation, unspecified: Secondary | ICD-10-CM | POA: Diagnosis not present

## 2016-11-10 DIAGNOSIS — F5101 Primary insomnia: Secondary | ICD-10-CM | POA: Diagnosis not present

## 2016-11-10 DIAGNOSIS — G459 Transient cerebral ischemic attack, unspecified: Secondary | ICD-10-CM | POA: Diagnosis not present

## 2016-11-10 DIAGNOSIS — Z48812 Encounter for surgical aftercare following surgery on the circulatory system: Secondary | ICD-10-CM | POA: Diagnosis not present

## 2016-11-10 DIAGNOSIS — E119 Type 2 diabetes mellitus without complications: Secondary | ICD-10-CM | POA: Diagnosis not present

## 2016-11-10 DIAGNOSIS — G8190 Hemiplegia, unspecified affecting unspecified side: Secondary | ICD-10-CM | POA: Diagnosis not present

## 2016-11-10 DIAGNOSIS — E785 Hyperlipidemia, unspecified: Secondary | ICD-10-CM | POA: Diagnosis not present

## 2016-11-10 DIAGNOSIS — R2681 Unsteadiness on feet: Secondary | ICD-10-CM | POA: Diagnosis not present

## 2016-11-10 DIAGNOSIS — F17203 Nicotine dependence unspecified, with withdrawal: Secondary | ICD-10-CM | POA: Diagnosis not present

## 2016-11-10 DIAGNOSIS — Z111 Encounter for screening for respiratory tuberculosis: Secondary | ICD-10-CM | POA: Diagnosis not present

## 2016-11-10 DIAGNOSIS — H04129 Dry eye syndrome of unspecified lacrimal gland: Secondary | ICD-10-CM | POA: Diagnosis not present

## 2016-11-10 DIAGNOSIS — E569 Vitamin deficiency, unspecified: Secondary | ICD-10-CM | POA: Diagnosis not present

## 2016-11-10 DIAGNOSIS — G464 Cerebellar stroke syndrome: Secondary | ICD-10-CM | POA: Diagnosis not present

## 2016-11-10 DIAGNOSIS — I471 Supraventricular tachycardia: Secondary | ICD-10-CM | POA: Diagnosis not present

## 2016-11-10 DIAGNOSIS — R531 Weakness: Secondary | ICD-10-CM | POA: Diagnosis not present

## 2016-11-10 DIAGNOSIS — D62 Acute posthemorrhagic anemia: Secondary | ICD-10-CM | POA: Diagnosis not present

## 2016-11-10 DIAGNOSIS — Z9181 History of falling: Secondary | ICD-10-CM | POA: Diagnosis not present

## 2016-11-10 DIAGNOSIS — M19071 Primary osteoarthritis, right ankle and foot: Secondary | ICD-10-CM | POA: Diagnosis not present

## 2016-11-10 LAB — GLUCOSE, CAPILLARY
Glucose-Capillary: 92 mg/dL (ref 65–99)
Glucose-Capillary: 79 mg/dL (ref 65–99)

## 2016-11-10 NOTE — Care Management Note (Signed)
Case Management Note Original Note Created Pollie Friar, RN 11/02/2016, 2:12 PM   Patient Details  Name: Regina James MRN: [id number] Date of Birth: 05/04/1933  Subjective/Objective:  Pt admitted with CVA. She is from home with her spouse.                   Action/Plan: Plan is for [facility] tomorrow. Currently PT recommending HHPT and walker. CM following for d/c needs, physician orders.   Expected Discharge Date:                 Expected Discharge Plan:  [place]  In-House Referral:  Clinical Social Work  Discharge planning Services  CM Consult  Post Acute Care Choice:    Choice offered to:     DME Arranged:    DME Agency:     HH Arranged:    [place] Agency:     Status of Service:  Completed, signed off  If discussed at [employer] of Stay Meetings, dates discussed:  8/7  Discharge Disposition: skilled facility   Additional Comments:  11/10/16- 1200- Marvetta Gibbons RN, CM- pt for d/c today- plan for SNF- CSW following for placement needs  11/09/16- 1020- Zynasia Burklow RN, CM- pt s/p CEA with post op hematoma and new infarcts-  Pt progressing- CIR consulted however pt relatively high functional and CIR feels SNF would be more appropriate plan for discharge.- CSW has been consulted- and is following for SNF placement.   Dahlia Client Williamson, RN 11/10/2016, 12:13 PM [phone]

## 2016-11-10 NOTE — Progress Notes (Signed)
Clinical Social Worker facilitated patient discharge including contacting patient family and facility to confirm patient discharge plans.  Clinical information faxed to facility and family agreeable with plan.  CSW arranged ambulance transport via [place] to [facility] .  RN Erasmo Downer to call [phone] (pt will be placed in rm# [id number]) for report prior to discharge.  Clinical Social Worker will sign off for now as social work intervention is no longer needed. Please consult [place] again if new need arises.  Rhea Pink, MSW, [place]

## 2016-11-10 NOTE — Progress Notes (Addendum)
IV and telel dcd patient transported to snf via ems discharged as ordered. Patient report called to [facility] Jacaden Forbush, Bettina Gavia RN

## 2016-11-10 NOTE — Progress Notes (Addendum)
  Progress Note  SUBJECTIVE:    No complaints. Ready to go to rehab.  OBJECTIVE:   Vitals:   11/10/16 0022 11/10/16 0430  BP: (!) 147/71 112/73  Pulse: 75 82  Resp: (!) 23 19  Temp: 98.3 F (36.8 C) 98.3 F (36.8 C)    Intake/Output Summary (Last 24 hours) at 11/10/16 0728 Last data filed at 11/09/16 1725  Gross per 24 hour  Intake              390 ml  Output                3 ml  Net              387 ml   Left neck swelling stable.  Right upper and lower extremity weakness. 5/5 strength left upper and lower extremities.   ASSESSMENT/PLAN:   82 y.o. female is s/p: left CEA 7 Days Post-Op   Right sided weakness continuing to improve.  Stable left neck swelling. D/c to SNF today.  On Plavix, ASA and statin.   Alvia Grove 11/10/2016 7:28 AM   Agree with above.  D/c to SNF today Follow up in a few weeks  Ruta Hinds, MD [facility] [place] Office: [phone] Pager: [phone]  -- LABS:   CBC    Component Value Date/Time   WBC 12.0 (H) 11/06/2016 0307   HGB 11.1 (L) 11/06/2016 0307   HCT 33.3 (L) 11/06/2016 0307   PLT 294 11/06/2016 0307    BMET    Component Value Date/Time   NA 138 11/06/2016 0307   K 3.6 11/06/2016 0307   CL 108 11/06/2016 0307   CO2 23 11/06/2016 0307   GLUCOSE 99 11/06/2016 0307   BUN 10 11/06/2016 0307   CREATININE 0.55 11/06/2016 0307   CALCIUM 8.2 (L) 11/06/2016 0307   GFRNONAA >60 11/06/2016 0307   GFRAA >60 11/06/2016 0307    COAG Lab Results  Component Value Date   INR 1.06 11/02/2016   INR 1.10 10/31/2016   INR 1.04 06/27/2016   No results found for: PTT  ANTIBIOTICS:   Anti-infectives    Start     Dose/Rate Route Frequency Ordered Stop   11/04/16 0600  cefUROXime (ZINACEF) 1.5 g in dextrose 5 % 50 mL IVPB     1.5 g 100 mL/hr over 30 Minutes Intravenous On call to O.R. 11/03/16 1709 11/03/16 1719   11/04/16 0400  cefUROXime (ZINACEF) 1.5 g in dextrose 5 % 50 mL IVPB       1.5 g 100 mL/hr over 30 Minutes Intravenous Every 12 hours 11/04/16 0001 11/04/16 1700   11/03/16 0400  ceFAZolin (ANCEF) IVPB 1 g/50 mL premix  Status:  Discontinued    Comments:  Send with pt to OR   1 g 100 mL/hr over 30 Minutes Intravenous On call 11/02/16 1328 11/03/16 2340   11/02/16 1111  cefUROXime (ZINACEF) 1.5 g in dextrose 5 % 50 mL IVPB     1.5 g 100 mL/hr over 30 Minutes Intravenous On call to O.R. 11/02/16 1110 11/03/16 [place], PA-C [facility] Office: [phone] Pager: [phone] 11/10/2016 7:28 AM

## 2016-11-11 ENCOUNTER — Ambulatory Visit: Payer: Medicare Other

## 2016-11-11 DIAGNOSIS — I10 Essential (primary) hypertension: Secondary | ICD-10-CM | POA: Diagnosis not present

## 2016-11-11 DIAGNOSIS — I639 Cerebral infarction, unspecified: Secondary | ICD-10-CM | POA: Diagnosis not present

## 2016-11-11 NOTE — Progress Notes (Signed)
Physical Therapy Treatment Note--Late Entry  Patient continues to make progress toward mobility goals. Pt does continue to demonstrate R side weakness and balance deficits increasing risk for falls. Current plan remains appropriate.    11/10/16 0919  PT Visit Information  Last PT Received On 11/10/16  Assistance Needed +1  History of Present Illness Regina James is an 83 y.o. female with a history of cancer, history PSVT, palpitations, tobacco abuse, CVA in March, 2018 with residual proximal RUE weakness, HTN, who presented with right sided weakness. Scattered small acute infarcts in the left cerebral hemisphere, greatest in the posterior frontal lobe near the vertex. S/p L CEA 8/1 and taken back into surgery to evacuate 5x5 cm hematoma. Vent dependent until extubation 8/3. Latest MRI 8/2 shows new infarcts as compared to 7/29 MRI.   Subjective Data  Patient Stated Goal To return to independent  Precautions  Precautions Fall  Precaution Comments decreased R side awarness  Restrictions  Weight Bearing Restrictions No  Pain Assessment  Pain Assessment Faces  Faces Pain Scale 2  Pain Location neck incision site   Pain Descriptors / Indicators Tender  Pain Intervention(s) Monitored during session  Cognition  Arousal/Alertness Awake/alert  Behavior During Therapy WFL for tasks assessed/performed  Overall Cognitive Status Within Functional Limits for tasks assessed  Bed Mobility  Overal bed mobility Modified Independent  Bed Mobility Supine to Sit  General bed mobility comments increased time and effort  Transfers  Overall transfer level Needs assistance  Equipment used Rolling walker (2 wheeled)  Transfers Sit to/from Stand  Sit to Stand Supervision  General transfer comment supervision for safety  Ambulation/Gait  Ambulation/Gait assistance Min guard  Ambulation Distance (Feet) 360 Feet  Assistive device Rolling walker (2 wheeled)  Gait Pattern/deviations Step-through pattern   General Gait Details cues for navigating objects in hallway and pt easily distracted in busy environment; pt with significant gait deviations with head turns especially verical  Gait velocity decreased  Modified Rankin (Stroke Patients Only)  Pre-Morbid Rankin Score 1  Modified Rankin 4  Balance  Overall balance assessment Needs assistance  Sitting-balance support No upper extremity supported;Feet supported  Sitting balance-Leahy Scale Good  Standing balance support No upper extremity supported  Standing balance-Leahy Scale Fair  High Level Balance Comments pt unable to maintain balance with decreased BOS and wide BOS with eyes closed without assistance from therapist   PT - End of Session  Equipment Utilized During Treatment Gait belt  Activity Tolerance Patient tolerated treatment well  Patient left in chair;with call bell/phone within reach;with family/visitor present  Nurse Communication Mobility status  PT - Assessment/Plan  PT Plan Current plan remains appropriate  PT Visit Diagnosis Unsteadiness on feet (R26.81);Other abnormalities of gait and mobility (R26.89);Muscle weakness (generalized) (M62.81);Hemiplegia and hemiparesis  Hemiplegia - Right/Left Right  Hemiplegia - dominant/non-dominant Dominant  Hemiplegia - caused by Cerebral infarction  PT Frequency (ACUTE ONLY) Min 4X/week  Recommendations for Other Services (cognition assessment )  Follow Up Recommendations SNF  PT equipment Rolling walker with 5" wheels  AM-PAC PT "6 Clicks" Daily Activity Outcome Measure  Difficulty turning over in bed (including adjusting bedclothes, sheets and blankets)? 3  Difficulty moving from lying on back to sitting on the side of the bed?  3  Difficulty sitting down on and standing up from a chair with arms (e.g., wheelchair, bedside commode, etc,.)? 2  Help needed moving to and from a bed to chair (including a wheelchair)? 3  Help needed walking in hospital  room? 3  Help needed climbing  3-5 steps with a railing?  3  6 Click Score 17  Mobility G Code  CK  PT Goal Progression  Progress towards PT goals Progressing toward goals  PT Time Calculation  PT Start Time (ACUTE ONLY) 1131  PT Stop Time (ACUTE ONLY) 1155  PT Time Calculation (min) (ACUTE ONLY) 24 min  PT General Charges  $$ ACUTE PT VISIT 1 Visit  Earney Navy, PTA Pager: [phone]

## 2016-11-18 ENCOUNTER — Ambulatory Visit (INDEPENDENT_AMBULATORY_CARE_PROVIDER_SITE_OTHER): Payer: Self-pay | Admitting: Vascular Surgery

## 2016-11-18 ENCOUNTER — Encounter: Payer: Self-pay | Admitting: Vascular Surgery

## 2016-11-18 VITALS — BP 162/82 | HR 66 | Temp 97.8°F | Resp 16 | Ht 66.0 in | Wt 160.0 lb

## 2016-11-18 DIAGNOSIS — I6522 Occlusion and stenosis of left carotid artery: Secondary | ICD-10-CM

## 2016-11-18 NOTE — Progress Notes (Signed)
Patient is an 83 year old female who returns for postoperative follow-up today. She recently underwent left carotid endarterectomy for asymptomatic left carotid stenosis after a recent left brain stroke. This was, located by perioperative stroke. She still has some residual right side weakness in her leg and some discoordination of her right hand. She denies any incisional drainage. She has no difficulty swallowing. She states that her voice is slightly hoarse compared to baseline but near normal. She currently is residing in a skilled nursing facility receiving rehabilitation. She is scheduled to be discharged from this rehabilitation facility soon. She is requesting to start driving again today. She currently is on Plavix aspirin and a statin.  Of note she has a known left subclavian artery stenosis which has been asymptomatic. I discussed with her today that she should always have the blood pressure measured in her right arm. She was noted to have a 30 mm gradient between left and right arms today. Left subclavian stenosis is about 80%.  Also she has a history of an ascending aortic aneurysm. This recently measured about 4.4 cm. She has a 9 penetrating aortic ulcer of about 15 mm. This was seen on CT scan August 2018. While the patient was in the hospital I discussed these findings with Dr. Servando Snare from cardiac surgery. In light of the patient's age and current problems with recent strokes most likely observation at this point.  Current Outpatient Prescriptions on File Prior to Visit  Medication Sig Dispense Refill  . aspirin 325 MG tablet Take 1 tablet (325 mg total) by mouth daily.    . clopidogrel (PLAVIX) 75 MG tablet Take 1 tablet (75 mg total) by mouth daily. 30 tablet 11  . metoprolol succinate (TOPROL-XL) 100 MG 24 hr tablet Take  0.5 tablet ( 50 mg total) once daily 45 tablet 3  . Multiple Vitamin (MULTIVITAMIN) tablet Take 1 tablet by mouth daily.      Vladimir Faster Glycol-Propyl Glycol  (SYSTANE OP) Place 1 drop into both eyes daily as needed (for dry eyes).     . pravastatin (PRAVACHOL) 20 MG tablet Take 1 tablet (20 mg total) by mouth daily at 6 PM. 30 tablet 11   No current facility-administered medications on file prior to visit.     Physical exam:  Vitals:   11/18/16 0929 11/18/16 0932  BP: 136/85 (!) 162/82  Pulse: 66   Resp: 16   Temp: 97.8 F (36.6 C)   TempSrc: Oral   SpO2: 95%   Weight: 160 lb (72.6 kg)   Height: 5\' 6"  (1.676 m)     Neck: Well-healed left neck incision no drainage no erythema  Neuro: Symmetric upper and lower extremity motor strength although very subtle coordination difficulty with the right leg and right hand  Chest: Clear to auscultation bilaterally  Cardiac: Regular rate and rhythm without murmur  Neck: No carotid bruits  Assessment: Recovering post left carotid endarterectomy. Needs repeat carotid duplex scan and see our nurse practitioner in 6 months time. Continue Plavix and aspirin as well as her statin.  Ascending aortic aneurysm. This is 4.4 cm in diameter. Situation could be given for repair and would need evaluation by cardiac surgery but will leave this at the discretion of her primary care physician if they wish to refer for this. Patient's prior CT scan has been reviewed by Dr. Servando Snare in the past.  Penetrating aortic ulcer 15 mm diameter. Probably needs repeat CT scan of this in one year  Prior history of tobacco  abuse patient should try to refrain from smoking  Plan: See above  Ruta Hinds, MD [facility] [place] Office: [phone] Pager: [phone]

## 2016-11-19 ENCOUNTER — Ambulatory Visit (INDEPENDENT_AMBULATORY_CARE_PROVIDER_SITE_OTHER): Payer: Medicare Other | Admitting: *Deleted

## 2016-11-19 DIAGNOSIS — I471 Supraventricular tachycardia: Secondary | ICD-10-CM

## 2016-11-19 DIAGNOSIS — I639 Cerebral infarction, unspecified: Secondary | ICD-10-CM

## 2016-11-19 LAB — CUP PACEART INCLINIC DEVICE CHECK
Date Time Interrogation Session: 20180817143151
Implantable Pulse Generator Implant Date: 20180731

## 2016-11-19 NOTE — Progress Notes (Signed)
Wound check in clinic s/p ILR implant. Steri strips removed. Wound well healed without redness or edema. Incision edges approximated. Normal ILR device function. Battery status: GOOD. R-waves 1.38mV. 0 symptom episodes, 0 tachy episodes, 0 pause episodes, 0 brady episodes. (4) "AF" episodes (0.2% burden), all from 8/4, max dur.18mins. Monthly summary reports and ROV with JA PRN. Patient education completed including wound care and remote monitoring.   Patient is aware that her home monitor is not connected. Patient refuses to use monitor until she returns home next week. Patient is aware that the office will not be able to view any episodes that she may have as long as the monitor is not plugged in. I instructed patient to send a manual transmission once she plugs in her monitor. Demonstration provided. Patient/daughter verbalized understanding.

## 2016-11-22 NOTE — Addendum Note (Signed)
Addended by: Lianne Cure A on: 11/22/2016 03:57 PM   Modules accepted: Orders

## 2016-11-24 ENCOUNTER — Encounter: Payer: Self-pay | Admitting: Cardiology

## 2016-11-25 DIAGNOSIS — I10 Essential (primary) hypertension: Secondary | ICD-10-CM | POA: Diagnosis not present

## 2016-11-25 DIAGNOSIS — Z7902 Long term (current) use of antithrombotics/antiplatelets: Secondary | ICD-10-CM | POA: Diagnosis not present

## 2016-11-25 DIAGNOSIS — I69351 Hemiplegia and hemiparesis following cerebral infarction affecting right dominant side: Secondary | ICD-10-CM | POA: Diagnosis not present

## 2016-11-25 DIAGNOSIS — Z9181 History of falling: Secondary | ICD-10-CM | POA: Diagnosis not present

## 2016-11-25 DIAGNOSIS — Z87891 Personal history of nicotine dependence: Secondary | ICD-10-CM | POA: Diagnosis not present

## 2016-11-29 DIAGNOSIS — Z87891 Personal history of nicotine dependence: Secondary | ICD-10-CM | POA: Diagnosis not present

## 2016-11-29 DIAGNOSIS — Z9181 History of falling: Secondary | ICD-10-CM | POA: Diagnosis not present

## 2016-11-29 DIAGNOSIS — I10 Essential (primary) hypertension: Secondary | ICD-10-CM | POA: Diagnosis not present

## 2016-11-29 DIAGNOSIS — Z7902 Long term (current) use of antithrombotics/antiplatelets: Secondary | ICD-10-CM | POA: Diagnosis not present

## 2016-11-29 DIAGNOSIS — I69351 Hemiplegia and hemiparesis following cerebral infarction affecting right dominant side: Secondary | ICD-10-CM | POA: Diagnosis not present

## 2016-12-02 ENCOUNTER — Ambulatory Visit: Payer: Medicare Other | Admitting: *Deleted

## 2016-12-07 ENCOUNTER — Telehealth: Payer: Self-pay | Admitting: Cardiology

## 2016-12-07 ENCOUNTER — Telehealth: Payer: Self-pay | Admitting: *Deleted

## 2016-12-07 DIAGNOSIS — R450 Nervousness: Secondary | ICD-10-CM | POA: Diagnosis not present

## 2016-12-07 DIAGNOSIS — I10 Essential (primary) hypertension: Secondary | ICD-10-CM | POA: Diagnosis not present

## 2016-12-07 DIAGNOSIS — Z8673 Personal history of transient ischemic attack (TIA), and cerebral infarction without residual deficits: Secondary | ICD-10-CM | POA: Diagnosis not present

## 2016-12-07 NOTE — Telephone Encounter (Signed)
Spoke with patient and confirmed that home monitor was connected.

## 2016-12-07 NOTE — Telephone Encounter (Signed)
New Message: ° ° ° ° °Please call. °

## 2016-12-07 NOTE — Telephone Encounter (Signed)
Spoke with patient regarding sending manual, 4 AF episodes-- no available ECGs. Patient sent manual transmission successfully. Advised patient will review episodes once received and will call back if further recommendations.

## 2016-12-08 DIAGNOSIS — D692 Other nonthrombocytopenic purpura: Secondary | ICD-10-CM | POA: Diagnosis not present

## 2016-12-08 DIAGNOSIS — L821 Other seborrheic keratosis: Secondary | ICD-10-CM | POA: Diagnosis not present

## 2016-12-08 DIAGNOSIS — D225 Melanocytic nevi of trunk: Secondary | ICD-10-CM | POA: Diagnosis not present

## 2016-12-08 DIAGNOSIS — L814 Other melanin hyperpigmentation: Secondary | ICD-10-CM | POA: Diagnosis not present

## 2016-12-08 NOTE — Telephone Encounter (Signed)
Manual transmission received. 4 episodes reviewed-- ECGs appear AF some with RVR, total duration on August 4th 56 minutes, Max V rate 200 bpm. Will review with Dr. Rayann Heman.

## 2016-12-08 NOTE — Telephone Encounter (Signed)
Per Dr. Rayann Heman previously reviewed AF episodes. post op CEA, Per JA and Dr. Erlinda Hong continue to monitor for further episodes. Will continue to follow [facility] for further episodes.

## 2016-12-09 NOTE — Progress Notes (Signed)
[facility] Summary Report / Loop Recorder 

## 2016-12-17 ENCOUNTER — Other Ambulatory Visit: Payer: Self-pay | Admitting: Internal Medicine

## 2017-01-03 ENCOUNTER — Ambulatory Visit (INDEPENDENT_AMBULATORY_CARE_PROVIDER_SITE_OTHER): Payer: Medicare Other | Admitting: Neurology

## 2017-01-03 ENCOUNTER — Encounter: Payer: Self-pay | Admitting: Neurology

## 2017-01-03 ENCOUNTER — Ambulatory Visit (INDEPENDENT_AMBULATORY_CARE_PROVIDER_SITE_OTHER): Payer: Medicare Other | Admitting: *Deleted

## 2017-01-03 VITALS — BP 130/77 | HR 63 | Ht 66.0 in | Wt 160.8 lb

## 2017-01-03 DIAGNOSIS — I712 Thoracic aortic aneurysm, without rupture, unspecified: Secondary | ICD-10-CM | POA: Insufficient documentation

## 2017-01-03 DIAGNOSIS — I6522 Occlusion and stenosis of left carotid artery: Secondary | ICD-10-CM

## 2017-01-03 DIAGNOSIS — I639 Cerebral infarction, unspecified: Secondary | ICD-10-CM

## 2017-01-03 DIAGNOSIS — I471 Supraventricular tachycardia: Secondary | ICD-10-CM

## 2017-01-03 DIAGNOSIS — I771 Stricture of artery: Secondary | ICD-10-CM | POA: Diagnosis not present

## 2017-01-03 DIAGNOSIS — Z9889 Other specified postprocedural states: Secondary | ICD-10-CM | POA: Diagnosis not present

## 2017-01-03 DIAGNOSIS — I63412 Cerebral infarction due to embolism of left middle cerebral artery: Secondary | ICD-10-CM | POA: Diagnosis not present

## 2017-01-03 NOTE — Progress Notes (Signed)
STROKE NEUROLOGY FOLLOW UP NOTE  NAME: Regina James DOB: 1933-07-15  REASON FOR VISIT: stroke follow up HISTORY FROM: pt and chart  Today we had the pleasure of seeing Regina James in follow-up at our [facility]. Pt was accompanied by no one.   History Summary Regina James is a 83 y.o. female with history of paroxysmal SVT on metoprolol, cataracts and basal cell carcinoma admitted on 06/27/16 for R arm weakness and numbness. CT no acute abnormalities. MRI showed scattered left MCA territory infarcts. CTA head unremarkable but CTA neck left ICA 40-50% stenosis. Ascending aorta aneurysmal dilatation 4.6cm. Penetrating atheromatous ulcer at the aortic arch. Left subclavian A 80% stenosis distal to VA origin. EF 55-60%. DVT negative. LDL 95 and A1C 5.6. Regina James ASA changed to plavix , add lipitor 20mg  and recommend outpt TEE/loop given hx of paroxysmal SVT and PACs and PVCs. Also recommended VVS follow up with aortic arch aneurysm with ulceration, as well as left subclavian stenosis.   Follow up 08/10/16 (VP) - Since hospital discharge Regina James is doing well. Regina James has completed occupational therapy. Regina James is tolerating Regina James medications. Feels almost back to normal. Minor issues with handwriting. TEE and loop recorder as an outpatient have not been set up yet.  Admission 10/31/16 for right sided weakness. MRI showed scattered small acute infarcts in the left MCA and ACA, embolic pattern. CTA increased left proximal ICA soft plaque and stenosis now at 60%, 5mm penetrating aortic ulcer.  LDL 89 and A1C 5.7. Regina James stroke concerning for left ICA soft plaque with stenosis vs. cardioembolic with PSVT and suspicious for afib. TEE unremarkable and loop recorder placed. Dr. Oneida Alar consulted and agreed with left CEA which was done on 11/03/16. Post procedure, pt developed increased right sided weakness with left neck hematoma and Regina James was taken back to OR for exploration and emergent evacuation. CTA without  hemorrhage or LVO. MRI showed several new small acute embolic infarcts at the same region. Regina James had prolonged intubation due to hx of smoking and agitation. Eventually extubated on 11/05/16. Regina James recovered well and discharged with DAPT and pravastatin. Regina James was also on nicotine patch and recommend to quit smoking.   Interval History During the interval time, the Regina James has been doing well. Back to baseline prior to the 10/2916 admission with residue mild right hand dexterity difficulty. Regina James has complains of significant bruising b/l hands and arms with questionable hematuria 4-5 days prior. Regina James would like to discontinue antiplatelet. BP 130/77. Has quit smoking.   Regina James loop recorder showed an episode of afib on 11/06/16 lasting 16 minutes. However, given Regina James complication of surgery on 11/03/16 with prolonged intubation till 11/05/16, concerning the afib was related to surgery and medical condition. Did not offer anticoagulation at that time.   Regina James followed with VVS, last on 11/18/16 with Dr. Oneida Alar, considered left subclavian artery 80% stenosis was asymptomatic. Regina James should always have the blood pressure measured in Regina James right arm. No intervention needed. Regina James ascending aortic aneurysm measured about 4.4 cm and penetrating aortic ulcer about 15 mm. Dr. Oneida Alar discussed with Dr. Servando Snare recommended observation at this point.  REVIEW OF SYSTEMS: Full 14 system review of systems performed and notable only for those listed below and in HPI above, all others are negative:  Constitutional:   Cardiovascular:  Ear/Nose/Throat:   Skin:  Eyes:   Respiratory:   Gastroitestinal:   Genitourinary: blood in urine  Hematology/Lymphatic:   Endocrine:  Musculoskeletal:   Allergy/Immunology:   Neurological:  Psychiatric:  Sleep:   The following represents the Regina James's updated allergies and side effects list: Allergies  Allergen Reactions  . Morphine And Related Swelling  . Levofloxacin Anxiety and Other (See Comments)     Double vision    The neurologically relevant items on the Regina James's problem list were reviewed on today's visit.  Neurologic Examination  A problem focused neurological exam (12 or more points of the single system neurologic examination, vital signs counts as 1 point, cranial nerves count for 8 points) was performed.  Blood pressure 130/77, pulse 63, height 5\' 6"  (1.676 m), weight 160 lb 12.8 oz (72.9 kg).  General - Well nourished, well developed, in no apparent distress.  Ophthalmologic - Sharp disc margins OU.   Cardiovascular - Regular rate and rhythm.  Mental Status -  Level of arousal and orientation to time, place, and person were intact. Language including expression, naming, repetition, comprehension was assessed and found intact. Attention span and concentration were normal. Fund of Knowledge was assessed and was intact.  Cranial Nerves II - XII - II - Visual field intact OU. III, IV, VI - Extraocular movements intact. V - Facial sensation intact bilaterally. VII - Facial movement intact bilaterally. VIII - Hearing & vestibular intact bilaterally. X - Palate elevates symmetrically. XI - Chin turning & shoulder shrug intact bilaterally. XII - Tongue protrusion intact.  Motor Strength - The Regina James's strength was normal in all extremities except right hand dexterity difficulty.  Bulk was normal and fasciculations were absent.   Motor Tone - Muscle tone was assessed at the neck and appendages and was normal.  Reflexes - The Regina James's reflexes were 1+ in all extremities and Regina James had no pathological reflexes.  Sensory - Light touch, temperature/pinprick were assessed and were symmetrical.    Coordination - The Regina James had normal movements in the hands and feet with no ataxia or dysmetria.  Tremor was absent.  Gait and Station - The Regina James's transfers, posture, gait, station, and turns were observed as normal.   Functional score  mRS = 1   0 - No  symptoms.   1 - No significant disability. Able to carry out all usual activities, despite some symptoms.   2 - Slight disability. Able to look after own affairs without assistance, but unable to carry out all previous activities.   3 - Moderate disability. Requires some help, but able to walk unassisted.   4 - Moderately severe disability. Unable to attend to own bodily needs without assistance, and unable to walk unassisted.   5 - Severe disability. Requires constant nursing care and attention, bedridden, incontinent.   6 - Dead.   NIH Stroke Scale = 0   Data reviewed: I personally reviewed the images and agree with the radiology interpretations.  Ct Head Wo Contrast 06/27/2016 Slight periventricular small vessel disease. No intracranial mass, hemorrhage, or extra-axial fluid collection. No acute infarct evident. Foci of vascular calcification noted. Areas of ethmoid sinus disease noted, more severe on the right than on the left. There is leftward deviation of the nasal septum.   Ct Angio Head W Or Wo Contrast Result Date: 06/27/2016 1. Negative CTA for large vessel occlusion. 2. Mild for age plaque at the carotid siphons without flow-limiting stenosis. Otherwise widely patent anterior and posterior circulation. 3. Basilar artery supplied via at the dominant left vertebral artery. Hypoplastic right vertebral artery terminates in PICA.   Ct Angio Neck W Or Wo Contrast 06/27/2016 1. Atheromatous plaque about the carotid bifurcations bilaterally  with associated stenosis of up to 30% on the right and 40-50% on the left. 2. Aneurysmal dilatation of the ascending aorta up to 4.6 cm. Superimposed penetrating atheromatous ulcer at the aortic arch. 3. Atheromatous narrowing of approximately 40% at the origin of the great vessels. 4. Severe 80% left subclavian artery stenosis as above.   Mr Brain Wo Contrast (neuro Protocol) 06/27/2016 1. Widely scattered but small, mostly cortically based,  acute infarcts in the posterior Left MCA and the superior Left PCA territories. 2. No associated hemorrhage or mass effect. 3. Otherwise largely stable since 2007 and unremarkable for age noncontrast MRI appearance of the brain.   LE venous doppler - no DVT.  TTE - Left ventricle: The cavity size was normal. There was mild concentric hypertrophy. Systolic function was normal. The estimated ejection fraction was in the range of 55% to 60%. Wall motion was normal; there were no regional wall motion abnormalities. Doppler parameters are consistent with abnormal left ventricular relaxation (grade 1 diastolic dysfunction). Doppler parameters are consistent with high ventricular filling pressure. - Aortic valve: Transvalvular velocity was within the normal range. There was no stenosis. There was no regurgitation. - Mitral valve: Transvalvular velocity was within the normal range. There was no evidence for stenosis. There was no regurgitation. - Left atrium: The atrium was moderately dilated. - Right ventricle: The cavity size was normal. Wall thickness was normal. Systolic function was normal. - Atrial septum: No defect or patent foramen ovale was identified. - Tricuspid valve: There was mild regurgitation. - Pulmonary arteries: Systolic pressure was within the normal range. PA peak pressure: 30 mm Hg (S).  Ct Head Wo Contrast 10/31/2016 1. Negative for bleed or other acute process.  2. Patchy left frontal parietal white matter hypointensities more conspicuous than on prior study.  Ct Angio Head W Or Wo Contrast Ct Angio Neck W Or Wo Contrast 11/01/2016 1. Penetrating Aortic Ulcer (PAU) with progression since March, now measuring up to 14 mm. See series 9, image 171 and series 12, image 127.  2. Negative for large vessel occlusion. Mild progression of bulky soft plaque at the left ICA origin since March, with  60% proximal Left ICA stenosis. No other large vessel  stenosis.  3. Mild to moderate atherosclerotic irregularity of the left MCA and left greater than right PCA branches.  4. Continued stable non contrast CT appearance of the brain.    Mr Brain Limited Wo Contrast 10/31/2016 1. Scattered small acute infarcts in the left cerebral hemisphere, greatest in the posterior frontal lobe near the vertex.  2. Mild chronic small vessel ischemic disease.  2D Echo 06/28/2016 Study Conclusions - Left ventricle: The cavity size was normal. There was mildconcentric hypertrophy. Systolic function was normal. Theestimated ejection fraction was in the range of 55% to 60%. Wallmotion was normal; there were no regional wall motionabnormalities. Doppler parameters are consistent with abnormalleft ventricular relaxation (grade 1 diastolic dysfunction).Doppler parameters are consistent with high ventricular fillingpressure. - Aortic valve: Transvalvular velocity was within the normal range.There was no stenosis. There was no regurgitation. - Mitral valve: Transvalvular velocity was within the normal range.There was no evidence for stenosis. There was no regurgitation. - Left atrium: The atrium was moderately dilated. - Right ventricle: The cavity size was normal. Wall thickness wasnormal. Systolic function was normal. - Atrial septum: No defect or patent foramen ovale was identified. - Tricuspid valve: There was mild regurgitation. - Pulmonary arteries: Systolic pressure was within the normal range. PA peak pressure: 30 mm  Hg (S).  VAS [place] LE 06/28/2016 Summary: - No evidence of deep vein or superficial thrombosis involving the right lower extremity and left lower extremity.  TEE Normal LV function; no LAA thrombus; atrial septal aneurysm; negative saline microcavitation study.  Ct Angio Head and neck W Or Wo Contrast 11/04/2016 1. Negative CTA for emergent large vessel occlusion.  2. Postoperative changes from recent left carotid endarterectomy  without complication. No significant residual stenosis.  3. High-grade approximate 80% stenosis involving the mid left subclavian artery.  4. No other high-grade or critical flow limiting stenosis.  5. Penetrating aortic ulcer or, measuring 15 mm on today's exam, previously 14 mm on 11/01/2016.  6. Small vessel atheromatous irregularity involving the distal left MCA and left greater than right PCA branches bilaterally.   Mr Brain Wo Contrast 11/04/2016 Several new small acute embolic infarcts in the left cerebral hemisphere, predominantly in the posterior frontal lobe near the vertex.   Component     Latest Ref Rng & Units 06/28/2016 11/01/2016  Cholesterol     0 - 200 mg/dL 167 147  Triglycerides     <150 mg/dL 206 (H) 82  HDL Cholesterol     >40 mg/dL 31 (L) 42  Total CHOL/HDL Ratio     RATIO 5.4 3.5  VLDL     0 - 40 mg/dL 41 (H) 16  LDL (calc)     0 - 99 mg/dL 95 89  Hemoglobin A1C     4.8 - 5.6 % 5.6 5.7 (H)  Mean Plasma Glucose     mg/dL 114 117     Assessment: As you may recall, Regina James is a 83 y.o. Caucasian female with PMH of paroxysmal SVT on metoprolol admitted on 06/27/16 for scattered left MCA territory infarcts. CTA head unremarkable but CTA neck left ICA 40-50% stenosis. Ascending aorta aneurysmal dilatation 4.6 cm. Penetrating atheromatous ulcer at the aortic arch. Left subclavian A 80% stenosis distal to VA origin. EF 55-60%. DVT negative. LDL 95 and A1C 5.6. Regina James ASA changed to plavix, add lipitor 20mg  and recommend outpt TEE/loop given hx of paroxysmal SVT and PACs and PVCs. Also recommended VVS follow up with aortic arch aneurysm with ulceration, as well as left subclavian stenosis.   Re- admitted on 10/31/16 for scattered small acute infarcts in the left MCA and ACA, embolic pattern. CTA increased left proximal ICA soft plaque and stenosis now at 60%, 39mm penetrating aortic ulcer.  LDL 89 and A1C 5.7. Regina James stroke concerning for left ICA soft plaque with stenosis vs.  cardioembolic with PSVT and suspicious for afib. TEE unremarkable and loop recorder placed. Dr. Oneida Alar did left CEA on 3/0/86 which complicated by post op neck hematoma s/p emergent evacuation. CTA without hemorrhage or LVO. MRI showed several new small acute embolic infarcts at the same region. Regina James had prolonged intubation due to hx of smoking and agitation. Eventually extubated on 11/05/16. Regina James recovered well and discharged with DAPT and pravastatin. Has quit smoking since. Still has residue of mild right hand dexterity difficulty. Regina James has complains of significant bruising b/l hands and arms with questionable hematuria 4-5 days prior.   Regina James loop recorder showed an episode of afib on 11/06/16 lasting 16 minutes. However, given Regina James complication of surgery on 11/03/16 with prolonged intubation till 11/05/16, concerning the afib was related to surgery and medical condition. Did not offer anticoagulation at that time.   Regina James followed with VVS, last on 11/18/16 with Dr. Oneida Alar, considered left subclavian artery 80% stenosis was  asymptomatic. Regina James should always have the blood pressure measured in Regina James right arm. No intervention needed. Regina James ascending aortic aneurysm measured about 4.4 cm last check and penetrating aortic ulcer about 15 mm. Dr. Oneida Alar discussed with Dr. Servando Snare recommended observation at this point.  Plan:  - continue ASA 325mg  daily and pravastin for stroke prevention  - stop plavix for now - continue to follow up with Dr. Oneida Alar  - continue to follow up with loop recorder - Follow up with your primary care physician for stroke risk factor modification. Recommend maintain blood pressure goal <130/80, diabetes with hemoglobin A1c goal below 7.0% and lipids with LDL cholesterol goal below 70 mg/dL.  - healthy diet and self exercise of right hand - check BP at home and record - continue to abstain from smoking - follow up in 4 months.   I spent more than 25 minutes of face to face time with the  Regina James. Greater than 50% of time was spent in counseling and coordination of care. We discussed stroke etiology, loop recorder monitoring, follow up with VVS, abstain from smoking.    No orders of the defined types were placed in this encounter.   Meds ordered this encounter  Medications  . traMADol (ULTRAM) 50 MG tablet  . atorvastatin (LIPITOR) 20 MG tablet  . losartan (COZAAR) 25 MG tablet    Regina James Instructions  - continue ASA 325mg  daily and pravastin for stroke prevention  - stop plavix for now - continue to follow up with Dr. Oneida Alar  - continue to follow up with loop recorder - Follow up with your primary care physician for stroke risk factor modification. Recommend maintain blood pressure goal <130/80, diabetes with hemoglobin A1c goal below 7.0% and lipids with LDL cholesterol goal below 70 mg/dL.  - healthy diet and self exercise of right hand - check BP at home and record - continue to abstain from smoking - follow up in 4 months.    [name], MD PhD [facility] [address], [place], [place] [address] [phone]

## 2017-01-03 NOTE — Progress Notes (Signed)
[facility] Summary Report / Loop Recorder 

## 2017-01-03 NOTE — Patient Instructions (Addendum)
-   continue ASA 325mg  daily and pravastin for stroke prevention  - stop plavix for now - continue to follow up with Dr. Oneida Alar  - continue to follow up with loop recorder - Follow up with your primary care physician for stroke risk factor modification. Recommend maintain blood pressure goal <130/80, diabetes with hemoglobin A1c goal below 7.0% and lipids with LDL cholesterol goal below 70 mg/dL.  - healthy diet and self exercise of right hand - check BP at home and record - continue to abstain from smoking - follow up in 4 months.

## 2017-01-04 ENCOUNTER — Other Ambulatory Visit: Payer: Self-pay | Admitting: Internal Medicine

## 2017-01-04 DIAGNOSIS — I712 Thoracic aortic aneurysm, without rupture: Secondary | ICD-10-CM | POA: Diagnosis not present

## 2017-01-04 DIAGNOSIS — I700 Atherosclerosis of aorta: Secondary | ICD-10-CM | POA: Diagnosis not present

## 2017-01-04 DIAGNOSIS — I10 Essential (primary) hypertension: Secondary | ICD-10-CM | POA: Diagnosis not present

## 2017-01-04 DIAGNOSIS — I2510 Atherosclerotic heart disease of native coronary artery without angina pectoris: Secondary | ICD-10-CM | POA: Diagnosis not present

## 2017-01-06 LAB — CUP PACEART REMOTE DEVICE CHECK
Date Time Interrogation Session: 20180929140644
Implantable Pulse Generator Implant Date: 20180731

## 2017-01-18 DIAGNOSIS — I10 Essential (primary) hypertension: Secondary | ICD-10-CM | POA: Diagnosis not present

## 2017-01-31 ENCOUNTER — Ambulatory Visit (INDEPENDENT_AMBULATORY_CARE_PROVIDER_SITE_OTHER): Payer: Medicare Other | Admitting: *Deleted

## 2017-01-31 DIAGNOSIS — I639 Cerebral infarction, unspecified: Secondary | ICD-10-CM | POA: Diagnosis not present

## 2017-01-31 NOTE — Progress Notes (Signed)
[facility] Summary Report / Loop Recorder 

## 2017-02-02 ENCOUNTER — Telehealth: Payer: Self-pay | Admitting: Internal Medicine

## 2017-02-02 MED ORDER — PRAVASTATIN SODIUM 20 MG PO TABS: 20.0000 mg | ORAL_TABLET | Freq: Every day | ORAL | 6 refills | Status: DC

## 2017-02-02 NOTE — Telephone Encounter (Signed)
Per hospital discharge note: STOP taking these medications :  atorvastatin 20 MG tablet Commonly known as:  LIPITOR TAKE these medications: pravastatin 20 MG tablet Commonly known as:  PRAVACHOL Take 1 tablet (20 mg total) by mouth daily at 6 PM.  Pt notified, she states that she needs refill sent to the pharmacy. Pt has appt scheduled in march refill sent

## 2017-02-02 NOTE — Telephone Encounter (Signed)
New message    Patient calling for clarification if should be taking  atorvastatin (LIPITOR) 20 MG tablet and pravastatin (PRAVACHOL) 20 MG tablet. Please call  Pt c/o medication issue:  1. Name of Medication:  atorvastatin (LIPITOR) 20 MG tablet and pravastatin (PRAVACHOL) 20 MG tablet  2. How are you currently taking this medication (dosage and times per day)? As prescribed  3. Are you having a reaction (difficulty breathing--STAT)? No  4. What is your medication issue?  Patient wants to know which medication needs to be cancelled.

## 2017-02-04 LAB — CUP PACEART REMOTE DEVICE CHECK
Date Time Interrogation Session: 20181029164142
Implantable Pulse Generator Implant Date: 20180731

## 2017-02-14 ENCOUNTER — Ambulatory Visit: Payer: Medicare Other | Admitting: Neurology

## 2017-02-18 DIAGNOSIS — Z23 Encounter for immunization: Secondary | ICD-10-CM | POA: Diagnosis not present

## 2017-03-02 ENCOUNTER — Ambulatory Visit (INDEPENDENT_AMBULATORY_CARE_PROVIDER_SITE_OTHER): Payer: Medicare Other | Admitting: *Deleted

## 2017-03-02 DIAGNOSIS — I639 Cerebral infarction, unspecified: Secondary | ICD-10-CM | POA: Diagnosis not present

## 2017-03-02 NOTE — Progress Notes (Signed)
[facility] Summary Report / Loop Recorder 

## 2017-03-17 LAB — CUP PACEART REMOTE DEVICE CHECK
Date Time Interrogation Session: 20181128171203
Implantable Pulse Generator Implant Date: 20180731

## 2017-04-01 ENCOUNTER — Ambulatory Visit (INDEPENDENT_AMBULATORY_CARE_PROVIDER_SITE_OTHER): Payer: Medicare Other | Admitting: *Deleted

## 2017-04-01 DIAGNOSIS — I639 Cerebral infarction, unspecified: Secondary | ICD-10-CM

## 2017-04-04 NOTE — Progress Notes (Signed)
[facility] Summary Report / Loop Recorder 

## 2017-04-11 LAB — CUP PACEART REMOTE DEVICE CHECK
Date Time Interrogation Session: 20181228194210
Implantable Pulse Generator Implant Date: 20180731

## 2017-05-02 ENCOUNTER — Ambulatory Visit (INDEPENDENT_AMBULATORY_CARE_PROVIDER_SITE_OTHER): Payer: Medicare Other | Admitting: *Deleted

## 2017-05-02 DIAGNOSIS — I639 Cerebral infarction, unspecified: Secondary | ICD-10-CM

## 2017-05-02 NOTE — Progress Notes (Signed)
[facility] Summary Report / Loop Recorder 

## 2017-05-09 ENCOUNTER — Ambulatory Visit: Payer: Medicare Other | Admitting: Neurology

## 2017-05-11 DIAGNOSIS — L308 Other specified dermatitis: Secondary | ICD-10-CM | POA: Diagnosis not present

## 2017-05-11 DIAGNOSIS — L814 Other melanin hyperpigmentation: Secondary | ICD-10-CM | POA: Diagnosis not present

## 2017-05-11 DIAGNOSIS — L821 Other seborrheic keratosis: Secondary | ICD-10-CM | POA: Diagnosis not present

## 2017-05-11 DIAGNOSIS — Z85828 Personal history of other malignant neoplasm of skin: Secondary | ICD-10-CM | POA: Diagnosis not present

## 2017-05-11 DIAGNOSIS — D1801 Hemangioma of skin and subcutaneous tissue: Secondary | ICD-10-CM | POA: Diagnosis not present

## 2017-05-11 DIAGNOSIS — D225 Melanocytic nevi of trunk: Secondary | ICD-10-CM | POA: Diagnosis not present

## 2017-05-12 LAB — CUP PACEART REMOTE DEVICE CHECK
Date Time Interrogation Session: 20190127204723
Implantable Pulse Generator Implant Date: 20180731

## 2017-05-17 DIAGNOSIS — M6281 Muscle weakness (generalized): Secondary | ICD-10-CM | POA: Diagnosis not present

## 2017-05-17 DIAGNOSIS — I639 Cerebral infarction, unspecified: Secondary | ICD-10-CM | POA: Diagnosis not present

## 2017-05-17 DIAGNOSIS — R296 Repeated falls: Secondary | ICD-10-CM | POA: Diagnosis not present

## 2017-05-19 DIAGNOSIS — I639 Cerebral infarction, unspecified: Secondary | ICD-10-CM | POA: Diagnosis not present

## 2017-05-19 DIAGNOSIS — M6281 Muscle weakness (generalized): Secondary | ICD-10-CM | POA: Diagnosis not present

## 2017-05-19 DIAGNOSIS — R296 Repeated falls: Secondary | ICD-10-CM | POA: Diagnosis not present

## 2017-05-23 ENCOUNTER — Encounter: Payer: Self-pay | Admitting: Neurology

## 2017-05-23 ENCOUNTER — Ambulatory Visit (INDEPENDENT_AMBULATORY_CARE_PROVIDER_SITE_OTHER): Payer: Medicare Other | Admitting: Neurology

## 2017-05-23 VITALS — BP 122/74 | HR 80 | Wt 167.4 lb

## 2017-05-23 DIAGNOSIS — I63412 Cerebral infarction due to embolism of left middle cerebral artery: Secondary | ICD-10-CM | POA: Diagnosis not present

## 2017-05-23 DIAGNOSIS — Z9889 Other specified postprocedural states: Secondary | ICD-10-CM | POA: Diagnosis not present

## 2017-05-23 DIAGNOSIS — I771 Stricture of artery: Secondary | ICD-10-CM

## 2017-05-23 DIAGNOSIS — I712 Thoracic aortic aneurysm, without rupture, unspecified: Secondary | ICD-10-CM

## 2017-05-23 DIAGNOSIS — I6522 Occlusion and stenosis of left carotid artery: Secondary | ICD-10-CM

## 2017-05-23 NOTE — Patient Instructions (Addendum)
-   continue ASA 325mg  daily and pravastin for stroke prevention  - continue to follow up with Dr. Oneida Alar and Dr. Debara Pickett  - continue to follow up with loop recorder - Follow up with your primary care physician for stroke risk factor modification. Recommend maintain blood pressure goal <130/80, diabetes with hemoglobin A1c goal below 7.0% and lipids with LDL cholesterol goal below 70 mg/dL.  - healthy diet and continue exercise of right hand - check BP at home and record - continue to abstain from smoking - follow up as needed.

## 2017-05-23 NOTE — Progress Notes (Signed)
STROKE NEUROLOGY FOLLOW UP NOTE  NAME: Regina Regina James DOB: 28-Aug-1933  REASON FOR VISIT: stroke follow up HISTORY FROM: pt and chart  Today we had Regina pleasure of seeing Regina Regina James in follow-up at our [facility]rology [facility]. Pt was accompanied by no one.   History Summary Regina Regina James is a 84 y.o. female with history of paroxysmal SVT on metoprolol, cataracts and basal cell carcinoma admitted on 06/27/16 for R arm weakness and numbness. CT no acute abnormalities. MRI showed scattered left MCA territory infarcts. CTA head unremarkable but CTA neck left ICA 40-50% stenosis. Ascending aorta aneurysmal dilatation 4.6cm. Penetrating atheromatous ulcer at Regina aortic arch. Left subclavian A 80% stenosis distal to VA origin. EF 55-60%. DVT negative. LDL 95 and A1C 5.6. Regina Regina James ASA changed to plavix , add lipitor 20mg  and recommend outpt TEE/loop given hx of paroxysmal SVT and PACs and PVCs. Also recommended VVS follow up with aortic arch aneurysm with ulceration, as well as left subclavian stenosis.   Follow up 08/10/16 (VP) - Since hospital discharge Regina James is doing well. Regina Regina James has completed occupational therapy. Regina Regina James is tolerating Regina Regina James medications. Feels almost back to normal. Minor issues with handwriting. TEE and loop recorder as an outpatient have not been set up yet.  Admission 10/31/16 for right sided weakness. MRI showed scattered small acute infarcts in Regina left MCA and ACA, embolic pattern. CTA increased left proximal ICA soft plaque and stenosis now at 60%, 73mm penetrating aortic ulcer.  LDL 89 and A1C 5.7. Regina Regina James stroke concerning for left ICA soft plaque with stenosis vs. cardioembolic with PSVT and suspicious for afib. TEE unremarkable and loop recorder placed. Dr. Oneida Alar consulted and agreed with left CEA which was done on 11/03/16. Post procedure, pt developed increased right sided weakness with left neck hematoma and Regina Regina James was taken back to OR for exploration and emergent evacuation. CTA without  hemorrhage or LVO. MRI showed several new small acute embolic infarcts at Regina same region. Regina Regina James had prolonged intubation due to hx of smoking and agitation. Eventually extubated on 11/05/16. Regina Regina James recovered well and discharged with DAPT and pravastatin. Regina Regina James was also on nicotine patch and recommend to quit smoking.   01/03/17 follow up - Regina Regina James has been doing well. Back to baseline prior to Regina 10/2916 admission with residue mild right hand dexterity difficulty. Regina Regina James has complains of significant bruising b/l hands and arms with questionable hematuria 4-5 days prior. Regina Regina James would like to discontinue antiplatelet. BP 130/77. Has quit smoking.  Regina Regina James loop recorder showed an episode of afib on 11/06/16 lasting 16 minutes. However, given Regina Regina James complication of surgery on 11/03/16 with prolonged intubation till 11/05/16, concerning Regina afib was related to surgery and medical condition. Did not offer anticoagulation at that time.  Regina Regina James followed with VVS, last on 11/18/16 with Dr. Oneida Alar, considered left subclavian artery 80% stenosis was asymptomatic. Regina Regina James should always have Regina blood pressure measured in Regina Regina James right arm. No intervention needed. Regina Regina James ascending aortic aneurysm measured about 4.4 cm and penetrating aortic ulcer about 15 mm. Dr. Oneida Alar discussed with Dr. Servando Snare recommended observation at this point.  Interval History During Regina interval time, pt has been doing well. No recurrent stroke like symptoms. Loop recorder no afib reported since Regina last episode during hospitalization. Regina Regina James still has mild right hand dexterity difficulty but now has PT and personal trainer working on that. Gained 10lbs since quit smoking. BP stable today 122/74.  REVIEW OF SYSTEMS: Full 14 system review of systems performed and  notable only for those listed below and in HPI above, all others are negative:  Constitutional:   Cardiovascular:  Ear/Nose/Throat:   Skin:  Eyes:   Respiratory:   Gastroitestinal:   Genitourinary:     Hematology/Lymphatic:  Bruise easily Endocrine:  Musculoskeletal:   Allergy/Immunology:   Neurological:   Psychiatric:  Sleep:   Regina following represents Regina Regina James's updated allergies and side effects list: Allergies  Allergen Reactions  . Morphine And Related Swelling  . Levofloxacin Anxiety and Other (See Comments)    Double vision    Regina neurologically relevant items on Regina Regina James's problem list were reviewed on today's visit.  Neurologic Examination  A problem focused neurological exam (12 or more points of Regina single system neurologic examination, vital signs counts as 1 point, cranial nerves count for 8 points) was performed.  Blood pressure 122/74, pulse 80, weight 167 lb 6.4 oz (75.9 kg).  General - Well nourished, well developed, in no apparent distress.  Ophthalmologic - Sharp disc margins OU.   Cardiovascular - Regular rate and rhythm.  Mental Status -  Level of arousal and orientation to time, place, and person were intact. Language including expression, naming, repetition, comprehension was assessed and found intact. Attention span and concentration were normal. Fund of Knowledge was assessed and was intact.  Cranial Nerves II - XII - II - Visual field intact OU. III, IV, VI - Extraocular movements intact. V - Facial sensation intact bilaterally. VII - Facial movement intact bilaterally. VIII - Hearing & vestibular intact bilaterally. X - Palate elevates symmetrically. XI - Chin turning & shoulder shrug intact bilaterally. XII - Tongue protrusion intact.  Motor Strength - Regina Regina James's strength was normal in all extremities except right hand dexterity difficulty.  Bulk was normal and fasciculations were absent.   Motor Tone - Muscle tone was assessed at Regina neck and appendages and was normal.  Reflexes - Regina Regina James's reflexes were 1+ in all extremities and Regina Regina James had no pathological reflexes.  Sensory - Light touch, temperature/pinprick were  assessed and were symmetrical.    Coordination - Regina Regina James had normal movements in Regina hands and feet with no ataxia or dysmetria.  Tremor was absent.  Gait and Station - Regina Regina James's transfers, posture, gait, station, and turns were observed as normal.   Data reviewed: I personally reviewed Regina images and agree with Regina radiology interpretations.  Ct Head Wo Contrast 06/27/2016 Slight periventricular small vessel disease. No intracranial mass, hemorrhage, or extra-axial fluid collection. No acute infarct evident. Foci of vascular calcification noted. Areas of ethmoid sinus disease noted, more severe on Regina right than on Regina left. There is leftward deviation of Regina nasal septum.   Ct Angio Head W Or Wo Contrast Result Date: 06/27/2016 1. Negative CTA for large vessel occlusion. 2. Mild for age plaque at Regina carotid siphons without flow-limiting stenosis. Otherwise widely patent anterior and posterior circulation. 3. Basilar artery supplied via at Regina dominant left vertebral artery. Hypoplastic right vertebral artery terminates in PICA.   Ct Angio Neck W Or Wo Contrast 06/27/2016 1. Atheromatous plaque about Regina carotid bifurcations bilaterally with associated stenosis of up to 30% on Regina right and 40-50% on Regina left. 2. Aneurysmal dilatation of Regina ascending aorta up to 4.6 cm. Superimposed penetrating atheromatous ulcer at Regina aortic arch. 3. Atheromatous narrowing of approximately 40% at Regina origin of Regina great vessels. 4. Severe 80% left subclavian artery stenosis as above.   Mr Brain Wo Contrast (neuro Protocol) 06/27/2016 1.  Widely scattered but small, mostly cortically based, acute infarcts in Regina posterior Left MCA and Regina superior Left PCA territories. 2. No associated hemorrhage or mass effect. 3. Otherwise largely stable since 2007 and unremarkable for age noncontrast MRI appearance of Regina brain.   LE venous doppler - no DVT.  TTE - Left ventricle: Regina cavity size was  normal. There was mild concentric hypertrophy. Systolic function was normal. Regina estimated ejection fraction was in Regina range of 55% to 60%. Wall motion was normal; there were no regional wall motion abnormalities. Doppler parameters are consistent with abnormal left ventricular relaxation (grade 1 diastolic dysfunction). Doppler parameters are consistent with high ventricular filling pressure. - Aortic valve: Transvalvular velocity was within Regina normal range. There was no stenosis. There was no regurgitation. - Mitral valve: Transvalvular velocity was within Regina normal range. There was no evidence for stenosis. There was no regurgitation. - Left atrium: Regina atrium was moderately dilated. - Right ventricle: Regina cavity size was normal. Wall thickness was normal. Systolic function was normal. - Atrial septum: No defect or patent foramen ovale was identified. - Tricuspid valve: There was mild regurgitation. - Pulmonary arteries: Systolic pressure was within Regina normal range. PA peak pressure: 30 mm Hg (S).  Ct Head Wo Contrast 10/31/2016 1. Negative for bleed or other acute process.  2. Patchy left frontal parietal white matter hypointensities more conspicuous than on prior study.  Ct Angio Head W Or Wo Contrast Ct Angio Neck W Or Wo Contrast 11/01/2016 1. Penetrating Aortic Ulcer (PAU) with progression since March, now measuring up to 14 mm. See series 9, image 171 and series 12, image 127.  2. Negative for large vessel occlusion. Mild progression of bulky soft plaque at Regina left ICA origin since March, with  60% proximal Left ICA stenosis. No other large vessel stenosis.  3. Mild to moderate atherosclerotic irregularity of Regina left MCA and left greater than right PCA branches.  4. Continued stable non contrast CT appearance of Regina brain.    Mr Brain Limited Wo Contrast 10/31/2016 1. Scattered small acute infarcts in Regina left cerebral hemisphere, greatest in  Regina posterior frontal lobe near Regina vertex.  2. Mild chronic small vessel ischemic disease.  2D Echo 06/28/2016 Study Conclusions - Left ventricle: Regina cavity size was normal. There was mildconcentric hypertrophy. Systolic function was normal. Theestimated ejection fraction was in Regina range of 55% to 60%. Wallmotion was normal; there were no regional wall motionabnormalities. Doppler parameters are consistent with abnormalleft ventricular relaxation (grade 1 diastolic dysfunction).Doppler parameters are consistent with high ventricular fillingpressure. - Aortic valve: Transvalvular velocity was within Regina normal range.There was no stenosis. There was no regurgitation. - Mitral valve: Transvalvular velocity was within Regina normal range.There was no evidence for stenosis. There was no regurgitation. - Left atrium: Regina atrium was moderately dilated. - Right ventricle: Regina cavity size was normal. Wall thickness wasnormal. Systolic function was normal. - Atrial septum: No defect or patent foramen ovale was identified. - Tricuspid valve: There was mild regurgitation. - Pulmonary arteries: Systolic pressure was within Regina normal range. PA peak pressure: 30 mm Hg (S).  VAS [place] LE 06/28/2016 Summary: - No evidence of deep vein or superficial thrombosis involving Regina right lower extremity and left lower extremity.  TEE Normal LV function; no LAA thrombus; atrial septal aneurysm; negative saline microcavitation study.  Ct Angio Head and neck W Or Wo Contrast 11/04/2016 1. Negative CTA for emergent large vessel occlusion.  2. Postoperative changes from recent  left carotid endarterectomy without complication. No significant residual stenosis.  3. High-grade approximate 80% stenosis involving Regina mid left subclavian artery.  4. No other high-grade or critical flow limiting stenosis.  5. Penetrating aortic ulcer or, measuring 15 mm on today's exam, previously 14 mm on 11/01/2016.  6.  Small vessel atheromatous irregularity involving Regina distal left MCA and left greater than right PCA branches bilaterally.   Mr Brain Wo Contrast 11/04/2016 Several new small acute embolic infarcts in Regina left cerebral hemisphere, predominantly in Regina posterior frontal lobe near Regina vertex.   Component     Latest Ref Rng & Units 06/28/2016 11/01/2016  Cholesterol     0 - 200 mg/dL 167 147  Triglycerides     <150 mg/dL 206 (H) 82  HDL Cholesterol     >40 mg/dL 31 (L) 42  Total CHOL/HDL Ratio     RATIO 5.4 3.5  VLDL     0 - 40 mg/dL 41 (H) 16  LDL (calc)     0 - 99 mg/dL 95 89  Hemoglobin A1C     4.8 - 5.6 % 5.6 5.7 (H)  Mean Plasma Glucose     mg/dL 114 117     Assessment: As you may recall, Regina Regina James is a 84 y.o. Caucasian female with PMH of paroxysmal SVT on metoprolol admitted on 06/27/16 for scattered left MCA territory infarcts. CTA head unremarkable but CTA neck left ICA 40-50% stenosis. Ascending aorta aneurysmal dilatation 4.6 cm. Penetrating atheromatous ulcer at Regina aortic arch. Left subclavian A 80% stenosis distal to VA origin. EF 55-60%. DVT negative. LDL 95 and A1C 5.6. Regina Regina James ASA changed to plavix, add lipitor 20mg  and recommend outpt TEE/loop given hx of paroxysmal SVT and PACs and PVCs. Also recommended VVS follow up with aortic arch aneurysm with ulceration, as well as left subclavian stenosis.   Re- admitted on 10/31/16 for scattered small acute infarcts in Regina left MCA and ACA, embolic pattern. CTA increased left proximal ICA soft plaque and stenosis now at 60%, 58mm penetrating aortic ulcer.  LDL 89 and A1C 5.7. Regina Regina James stroke concerning for left ICA soft plaque with stenosis vs. cardioembolic with PSVT and suspicious for afib. TEE unremarkable and loop recorder placed. Dr. Oneida Alar did left CEA on 10/07/08 which complicated by post op neck hematoma s/p emergent evacuation. CTA without hemorrhage or LVO. MRI showed several new small acute embolic infarcts at Regina same region. Regina Regina James had  prolonged intubation due to hx of smoking and agitation. Eventually extubated on 11/05/16. Regina Regina James recovered well and discharged with DAPT and pravastatin. Has quit smoking since. Still has residue of mild right hand dexterity difficulty. Regina Regina James has complains of significant bruising b/l hands and arms with questionable hematuria 4-5 days prior.   Regina Regina James loop recorder showed an episode of afib on 11/06/16 lasting 16 minutes. However, given Regina Regina James complication of surgery on 11/03/16 with prolonged intubation till 11/05/16, concerning Regina afib was related to surgery and medical condition. Did not offer anticoagulation at that time.   Regina Regina James followed with VVS, last on 11/18/16 with Dr. Oneida Alar, considered left subclavian artery 80% stenosis was asymptomatic. Regina Regina James should always have Regina blood pressure measured in Regina Regina James right arm. No intervention needed. Regina Regina James ascending aortic aneurysm measured about 4.4 cm last check and penetrating aortic ulcer about 15 mm. Dr. Oneida Alar discussed with Dr. Servando Snare recommended observation at this point. During Regina interval time, doing well. No more afib on loop.   Plan:  - continue ASA 325mg  daily and pravastin  for stroke prevention  - continue to follow up with Dr. Oneida Alar and Dr. Debara Pickett  - continue to follow up with loop recorder - Follow up with your primary care physician for stroke risk factor modification. Recommend maintain blood pressure goal <130/80, diabetes with hemoglobin A1c goal below 7.0% and lipids with LDL cholesterol goal below 70 mg/dL.  - healthy diet and continue exercise of right hand - check BP at home and record - continue to abstain from smoking - follow up as needed.     No orders of Regina defined types were placed in this encounter.   No orders of Regina defined types were placed in this encounter.   Regina James Instructions  - continue ASA 325mg  daily and pravastin for stroke prevention  - continue to follow up with Dr. Oneida Alar and Dr. Debara Pickett  - continue to follow up with loop  recorder - Follow up with your primary care physician for stroke risk factor modification. Recommend maintain blood pressure goal <130/80, diabetes with hemoglobin A1c goal below 7.0% and lipids with LDL cholesterol goal below 70 mg/dL.  - healthy diet and continue exercise of right hand - check BP at home and record - continue to abstain from smoking - follow up as needed.     [name], MD PhD [facility] [address], [place], [place] [address] [phone]

## 2017-05-24 DIAGNOSIS — R296 Repeated falls: Secondary | ICD-10-CM | POA: Diagnosis not present

## 2017-05-24 DIAGNOSIS — M6281 Muscle weakness (generalized): Secondary | ICD-10-CM | POA: Diagnosis not present

## 2017-05-24 DIAGNOSIS — I639 Cerebral infarction, unspecified: Secondary | ICD-10-CM | POA: Diagnosis not present

## 2017-05-26 DIAGNOSIS — R296 Repeated falls: Secondary | ICD-10-CM | POA: Diagnosis not present

## 2017-05-26 DIAGNOSIS — M6281 Muscle weakness (generalized): Secondary | ICD-10-CM | POA: Diagnosis not present

## 2017-05-26 DIAGNOSIS — I639 Cerebral infarction, unspecified: Secondary | ICD-10-CM | POA: Diagnosis not present

## 2017-05-31 DIAGNOSIS — M6281 Muscle weakness (generalized): Secondary | ICD-10-CM | POA: Diagnosis not present

## 2017-05-31 DIAGNOSIS — R296 Repeated falls: Secondary | ICD-10-CM | POA: Diagnosis not present

## 2017-05-31 DIAGNOSIS — I639 Cerebral infarction, unspecified: Secondary | ICD-10-CM | POA: Diagnosis not present

## 2017-06-02 ENCOUNTER — Ambulatory Visit (HOSPITAL_COMMUNITY)
Admission: RE | Admit: 2017-06-02 | Discharge: 2017-06-02 | Disposition: A | Discharge status: Home / Self Care | Payer: Medicare Other | Source: Ambulatory Visit | Attending: Family | Admitting: Family

## 2017-06-02 ENCOUNTER — Ambulatory Visit (INDEPENDENT_AMBULATORY_CARE_PROVIDER_SITE_OTHER): Payer: Medicare Other | Admitting: Family

## 2017-06-02 ENCOUNTER — Encounter: Payer: Self-pay | Admitting: Family

## 2017-06-02 ENCOUNTER — Other Ambulatory Visit: Payer: Self-pay

## 2017-06-02 VITALS — BP 130/90 | HR 57 | Temp 97.3°F | Resp 16 | Ht 66.0 in | Wt 164.0 lb

## 2017-06-02 DIAGNOSIS — I6522 Occlusion and stenosis of left carotid artery: Secondary | ICD-10-CM

## 2017-06-02 DIAGNOSIS — I7121 Aneurysm of the ascending aorta, without rupture: Secondary | ICD-10-CM

## 2017-06-02 DIAGNOSIS — I771 Stricture of artery: Secondary | ICD-10-CM | POA: Diagnosis not present

## 2017-06-02 DIAGNOSIS — I712 Thoracic aortic aneurysm, without rupture: Secondary | ICD-10-CM

## 2017-06-02 LAB — VAS US CAROTID
LEFT ECA DIAS: -26 cm/s
Left CCA dist dias: -25 cm/s
Left CCA dist sys: -86 cm/s
Left CCA prox dias: 18 cm/s
Left CCA prox sys: 71 cm/s
Left ICA dist dias: -31 cm/s
Left ICA dist sys: -86 cm/s
Left ICA prox dias: -41 cm/s
Left ICA prox sys: -131 cm/s
RIGHT CCA MID DIAS: 12 cm/s
RIGHT ECA DIAS: -15 cm/s
Right CCA prox dias: 15 cm/s
Right CCA prox sys: 59 cm/s
Right cca dist sys: -71 cm/s

## 2017-06-02 NOTE — Progress Notes (Signed)
Chief Complaint: Follow up Extracranial Carotid Artery Stenosis   History of Present Illness  Regina James is a 84 y.o. female who is s/p left carotid endarterectomy on 11-03-16 by Dr. Oneida Alar. She had a preoperative stroke in August 2018 as manifested by right hemiparesis. She states she had another stroke during the left CEA as manifested by speech difficulties.  She still has some residual right side weakness in her leg and some discoordination of her right hand.  She denies any residual speech difficulties or hoarseness.   Of note she has a known left subclavian artery stenosis which has been asymptomatic. Dr. Valinda Hoar discussed with her that she should always have the blood pressure measured in her right arm. She was noted to have a 30 mm gradient between left and right arms at her visit on 11-18-16. Left subclavian stenosis was about 80%.  Also she has a history of an ascending aortic aneurysm. This recently measured about 4.4 cm. She has a a penetrating aortic ulcer of about 15 mm. This was seen on CT scan August 2018. While the patient was in the hospital Dr. Oneida Alar discussed these findings with Dr. Servando Snare from cardiac surgery. In light of the patient's age and current problems with recent strokes most likely observation at this point.  Dr. Oneida Alar last evaluated pt on 11-18-16. At that time his note indicated that pt needs repeat carotid duplex scan and see our nurse practitioner in 6 months time. Continue Plavix and aspirin as well as her statin. Ascending aortic aneurysm. This is 4.4 cm in diameter. Consideration could be given for repair and would need evaluation by cardiac surgery but will leave this at the discretion of her primary care physician if they wish to refer for this. Patient's prior CT scan has been reviewed by Dr. Servando Snare in the past. Penetrating aortic ulcer 15 mm diameter; Dr. Oneida Alar note from August 2018 indicates that pt probably needs repeat CT scan of this in one  year  She denies any chest pain.   She does not seem to have claudication in her legs walking the 15 minutes on a treadmill twice/week.   Diabetic: No, A1C was 5.7 on 11-06-16, serum creatinine on 11-06-16 was normal at 0.55 Tobacco use: former smoker, quit 06-10-16, smoked x 45 years  Pt meds include: Statin : yes ASA: yes, 325 mg daily Other anticoagulants/antiplatelets: no   Past Medical History:  Diagnosis Date  . Cancer ([place])    basal cell carcinoma  . History of PSVT (paroxysmal supraventricular tachycardia)    Reported back as far as 2010.  Marland Kitchen Palpitations   . Stroke [facility])     Social History Social History   Tobacco Use  . Smoking status: Former Smoker    Packs/day: 0.25    Years: 45.00    Pack years: 11.25    Types: Cigarettes    Last attempt to quit: 06/10/2016    Years since quitting: 0.9  . Smokeless tobacco: Never Used  Substance Use Topics  . Alcohol use: Yes    Alcohol/week: 4.2 oz    Types: 7 Glasses of wine per week    Comment: 1 glass wine daily  . Drug use: No    Family History Family History  Problem Relation Age of Onset  . Cancer Father        prostate  . Diabetes Father   . Varicose Veins Mother   . Cardiomyopathy Mother   . Kidney cancer Brother   . Arrhythmia  Brother     Surgical History Past Surgical History:  Procedure Laterality Date  . APPENDECTOMY  1993  . CHOLECYSTECTOMY  1996  . ENDARTERECTOMY Left 11/03/2016   Procedure: Exploration of Carotid  ENDARTERECTOMY.;  Surgeon: Elam Dutch, MD;  Location: [facility];  Service: Vascular;  Laterality: Left;  . ENDARTERECTOMY Left 11/03/2016   Procedure: ENDARTERECTOMY CAROTID;  Surgeon: Elam Dutch, MD;  Location: [facility];  Service: Vascular;  Laterality: Left;  . LOOP RECORDER INSERTION N/A 11/02/2016   Procedure: Loop Recorder Insertion;  Surgeon: [name], MD;  Location: [facility] CV LAB;  Service: Cardiovascular;  Laterality: N/A;  . ROTATOR CUFF REPAIR Right 1990  . TEE  WITHOUT CARDIOVERSION N/A 11/02/2016   Procedure: TRANSESOPHAGEAL ECHOCARDIOGRAM (TEE);  Surgeon: Lelon Perla, MD;  Location: [facility] ENDOSCOPY;  Service: Cardiovascular;  Laterality: N/A;  . US ECHOCARDIOGRAPHY  01/09/2009   EF 55-60%  . WRIST FRACTURE SURGERY      Allergies  Allergen Reactions  . Morphine And Related Swelling  . Levofloxacin Anxiety and Other (See Comments)    Double vision    Current Outpatient Medications  Medication Sig Dispense Refill  . aspirin 325 MG tablet Take 1 tablet (325 mg total) by mouth daily.    . Doxylamine Succinate, Sleep, (SLEEP AID PO) Take by mouth.    . losartan (COZAAR) 25 MG tablet     . metoprolol succinate (TOPROL-XL) 100 MG 24 hr tablet Take  0.5 tablet ( 50 mg total) once daily 45 tablet 3  . Multiple Vitamin (MULTI-VITAMINS) TABS Take by mouth.    Vladimir Faster Glycol-Propyl Glycol (SYSTANE OP) Place 1 drop into both eyes daily as needed (for dry eyes).     . pravastatin (PRAVACHOL) 20 MG tablet Take 1 tablet (20 mg total) by mouth daily at 6 PM. 30 tablet 6  . traMADol (ULTRAM) 50 MG tablet      No current facility-administered medications for this visit.     Review of Systems : See HPI for pertinent positives and negatives.  Physical Examination  Vitals:   06/02/17 1206 06/02/17 1209  BP: 133/75 130/90  Pulse: (!) 57   Resp: 16   Temp: (!) 97.3 F (36.3 C)   TempSrc: Oral   SpO2: 98%   Weight: 164 lb (74.4 kg)   Height: 5\' 6"  (1.676 m)    Body mass index is 26.47 kg/m.  General: WDWN female in NAD GAIT: normal Eyes: PERRLA HENT: No gross abnormalities.  Pulmonary:  Respirations are non-labored, good air movement, CTAB, no rales,  rhonchi, or wheezing. Cardiac: regular rhythm, +murmur.  VASCULAR EXAM Carotid Bruits Right Left   Negative Negative     Abdominal aortic pulse is not palpable. Radial pulses: 2+ right, 1+ left palpable  LE Pulses Right Left       POPLITEAL  not palpable   not palpable       POSTERIOR TIBIAL  not palpable   not palpable        DORSALIS PEDIS      ANTERIOR TIBIAL faintly palpable  not palpable     Gastrointestinal: soft, nontender, BS WNL, no r/g, no palpable masses. Musculoskeletal: No muscle atrophy/wasting. M/S 5/5 throughout, extremities without ischemic changes. Skin: No rashes, no ulcers, no cellulitis.   Neurologic:  A&O X 3; appropriate affect, sensation is normal; speech is normal, CN 2-12 intact, pain and light touch intact in extremities, motor exam as listed above. Psychiatric: Normal thought content, mood appropriate to clinical situation.     Assessment: SOKHA CRAKER is a 84 y.o. female who is s/p left carotid endarterectomy on 11-03-16 by Dr. Oneida Alar. She had a preoperative stroke in August 2018 as manifested by right hemiparesis. She states she had another stroke during the left CEA. She has not had any subsequent stroke or TIA.  She has an ascending aortic aneurysm and penetrating aortic ulcer; see Plan.   She smoked x 45 years, quit in March 2018.    DATA Carotid Duplex (06/02/17): Right ICA: 1-39% stenosis Left ICA: (CEA site) 1-39% stenosis Both vertebral artery flow is antegrade. Right subclavian artery: Normal flow hemodynamics. Left subclavian artery: monophasic waveforms, known stenosis    Plan:  CTA chest in August 2018 to follow up on ascending aortic aneurysm and penetrating aortic ulcer, will also check carotid duplex in 6 months, see me or Dr. Oneida Alar afterward.    I discussed in depth with the patient the nature of atherosclerosis, and emphasized the importance of maximal medical management including strict control of blood pressure, blood glucose, and lipid levels, obtaining regular exercise, and continued  cessation of smoking.  The patient is aware that without maximal medical management the underlying  atherosclerotic disease process will progress, limiting the benefit of any interventions. The patient was given information about stroke prevention and what symptoms should prompt the patient to seek immediate medical care. Thank you for allowing [place] to participate in this patient's care.  Clemon Chambers, RN, MSN, FNP-C [facility] Office: [phone]  Clinic Physician: Oneida Alar  06/02/17 12:13 PM

## 2017-06-02 NOTE — Patient Instructions (Addendum)
Stroke Prevention Some health problems and behaviors may make it more likely for you to have a stroke. Below are ways to lessen your risk of having a stroke.  Be active for at least 30 minutes on most or all days.  Do not smoke. Try not to be around others who smoke.  Do not drink too much alcohol. ? Do not have more than 2 drinks a day if you are a man. ? Do not have more than 1 drink a day if you are a woman and are not pregnant.  Eat healthy foods, such as fruits and vegetables. If you were put on a specific diet, follow the diet as told.  Keep your cholesterol levels under control through diet and medicines. Look for foods that are low in saturated fat, trans fat, cholesterol, and are high in fiber.  If you have diabetes, follow all diet plans and take your medicine as told.  Ask your doctor if you need treatment to lower your blood pressure. If you have high blood pressure (hypertension), follow all diet plans and take your medicine as told by your doctor.  If you are 82-82 years old, have your blood pressure checked every 3-5 years. If you are age 75 or older, have your blood pressure checked every year.  Keep a healthy weight. Eat foods that are low in calories, salt, saturated fat, trans fat, and cholesterol.  Do not take drugs.  Avoid birth control pills, if this applies. Talk to your doctor about the risks of taking birth control pills.  Talk to your doctor if you have sleep problems (sleep apnea).  Take all medicine as told by your doctor. ? You may be told to take aspirin or blood thinner medicine. Take this medicine as told by your doctor. ? Understand your medicine instructions.  Make sure any other conditions you have are being taken care of.  Get help right away if:  You suddenly lose feeling (you feel numb) or have weakness in your face, arm, or leg.  Your face or eyelid hangs down to one side.  You suddenly feel confused.  You have trouble talking  (aphasia) or understanding what people are saying.  You suddenly have trouble seeing in one or both eyes.  You suddenly have trouble walking.  You are dizzy.  You lose your balance or your movements are clumsy (uncoordinated).  You suddenly have a very bad headache and you do not know the cause.  You have new chest pain.  Your heart feels like it is fluttering or skipping a beat (irregular heartbeat). Do not wait to see if the symptoms above go away. Get help right away. Call your local emergency services (911 in [place] Do not drive yourself to the hospital. This information is not intended to replace advice given to you by your health care provider. Make sure you discuss any questions you have with your health care provider. Document Released: 09/21/2011 Document Revised: 08/28/2015 Document Reviewed: 09/22/2012 Elsevier Interactive Patient Education  2018 [employer].      Thoracic Aortic Aneurysm An aneurysm is a bulge in an artery. It happens when blood pushes up against a weakened or damaged artery wall. A thoracic aortic aneurysm is an aneurysm that occurs in the first part of the aorta, between the heart and the diaphragm. The aorta is the main artery of the body. It supplies blood from the heart to the rest of the body. Some aneurysms may not cause symptoms or problems. However,  the major concern with a thoracic aortic aneurysm is that it can enlarge and burst (rupture), or blood can flow between the layers of the wall of the aorta through a tear (aorticdissection). Both of these conditions can cause bleeding inside the body and can be life-threatening if they are not diagnosed and treated right away. What are the causes? The exact cause of this condition is not known. What increases the risk? The following factors may make you more likely to develop this condition:  Being age 82 or older.  Having a hardening of the arteries caused by the buildup of fat and other  substances in the lining of a blood vessel (arteriosclerosis).  Having inflammation of the walls of an artery (arteritis).  Having a genetic disease that weakens the body's connective tissue, such as Marfan syndrome.  Having an injury or trauma to the aorta.  Having an infection that is caused by bacteria, such as syphilis or staphylococcus, in the wall of the aorta (infectious aortitis).  Having high blood pressure (hypertension).  Being female.  Being white (Caucasian).  Having high cholesterol.  Having a family history of aneurysms.  Using tobacco.  Having chronic obstructive pulmonary disease (COPD).  What are the signs or symptoms? Symptoms of this condition vary depending on the size and rate of growth of the aneurysm. Most grow slowly and do not cause any symptoms. When symptoms do occur, they may include:  Pain in the chest, back, sides, or abdomen. The pain may vary in intensity. A sudden onset of severe pain may indicate that the aneurysm has ruptured.  Hoarseness.  Cough.  Shortness of breath.  Swallowing problems.  Swelling in the face, arms, or legs.  Fever.  Unexplained weight loss.  How is this diagnosed? This condition may be diagnosed with:  An ultrasound.  X-rays.  A CT scan.  An MRI.  Tests to check the arteries for damage or blockages (angiogram).  Most unruptured thoracic aortic aneurysms cause no symptoms, so they are often found during exams for other conditions. How is this treated? Treatment for this condition depends on:  The size of the aneurysm.  How fast the aneurysm is growing.  Your age.  Risk factors for rupture.  Aneurysms that are smaller than 2.2 inches (5.5 cm) may be managed by using medicines to control blood pressure, manage pain, or fight infection. You may need regular monitoring to see if the aneurysm is getting bigger. Your health care provider may recommend that you have an ultrasound every year or every 6  months. How often you need to have an ultrasound depends on the size of the aneurysm, how fast it is growing, and whether you have a family history of aneurysms. Surgical repair may be needed if your aneurysm is larger than 2.2 inches or if it is growing quickly. Follow these instructions at home: Eating and drinking  Eat a healthy diet. Your health care provider may recommend that you: ? Lower your salt (sodium) intake. In some people, too much salt can raise blood pressure and increase the risk of thoracic aortic aneurysm. ? Avoid foods that are high in saturated fat and cholesterol, such as red meat and dairy. ? Eat a diet that is low in sugar. ? Increase your fiber intake by including whole grains, vegetables, and fruits in your diet. Eating these foods may help to lower blood pressure.  Limit or avoid alcohol as recommended by your health care provider. Lifestyle  Follow instructions from your health  care provider about healthy lifestyle habits. Your health care provider may recommend that you: ? Do not use any products that contain nicotine or tobacco, such as cigarettes and e-cigarettes. If you need help quitting, ask your health care provider. ? Keep your blood pressure within normal limits. The target limit for most people is below 120/80. Check your blood pressure regularly. If it is high, ask your health care provider about ways that you can control it. ? Keep your blood sugar (glucose) level and cholesterol levels within normal limits. Target limits for most people are:  Blood glucose level: Less than 100 mg/dL.  Total cholesterol level: Less than 200 mg/dL. ? Maintain a healthy weight. Activity  Stay physically active and exercise regularly. Talk with your health care provider about how often you should exercise and ask which types of exercise are safe for you.  Avoid heavy lifting and activities that take a lot of effort (are strenuous). Ask your health care provider what  activities are safe for you. General instructions  Keep all follow-up visits as told by your health care provider. This is important. ? Talk with your health care provider about regular screenings to see if the aneurysm is getting bigger.  Take over-the-counter and prescription medicines only as told by your health care provider. Contact a health care provider if:  You have discomfort in your upper back, neck, or abdomen.  You have trouble swallowing.  You have a cough or hoarseness.  You have a family history of aneurysms.  You have unexplained weight loss. Get help right away if:  You have sudden, severe pain in your upper back and abdomen. This pain may move into your chest and arms.  You have shortness of breath.  You have a fever. This information is not intended to replace advice given to you by your health care provider. Make sure you discuss any questions you have with your health care provider. Document Released: 03/22/2005 Document Revised: 01/02/2016 Document Reviewed: 01/02/2016 Elsevier Interactive Patient Education  [employer].

## 2017-06-03 ENCOUNTER — Ambulatory Visit (INDEPENDENT_AMBULATORY_CARE_PROVIDER_SITE_OTHER): Payer: Medicare Other | Admitting: *Deleted

## 2017-06-03 DIAGNOSIS — I639 Cerebral infarction, unspecified: Secondary | ICD-10-CM | POA: Diagnosis not present

## 2017-06-06 ENCOUNTER — Ambulatory Visit: Payer: Medicare Other | Admitting: Internal Medicine

## 2017-06-06 NOTE — Progress Notes (Signed)
[facility] Summary Report / Loop Recorder 

## 2017-06-07 ENCOUNTER — Ambulatory Visit (INDEPENDENT_AMBULATORY_CARE_PROVIDER_SITE_OTHER): Payer: Medicare Other | Admitting: Internal Medicine

## 2017-06-07 ENCOUNTER — Encounter: Payer: Self-pay | Admitting: Internal Medicine

## 2017-06-07 VITALS — BP 120/84 | HR 63 | Ht 66.5 in | Wt 163.2 lb

## 2017-06-07 DIAGNOSIS — I471 Supraventricular tachycardia, unspecified: Secondary | ICD-10-CM

## 2017-06-07 DIAGNOSIS — I6522 Occlusion and stenosis of left carotid artery: Secondary | ICD-10-CM | POA: Diagnosis not present

## 2017-06-07 DIAGNOSIS — Z9889 Other specified postprocedural states: Secondary | ICD-10-CM

## 2017-06-07 DIAGNOSIS — R296 Repeated falls: Secondary | ICD-10-CM | POA: Diagnosis not present

## 2017-06-07 DIAGNOSIS — M6281 Muscle weakness (generalized): Secondary | ICD-10-CM | POA: Diagnosis not present

## 2017-06-07 DIAGNOSIS — I639 Cerebral infarction, unspecified: Secondary | ICD-10-CM | POA: Diagnosis not present

## 2017-06-07 MED ORDER — LOSARTAN POTASSIUM 25 MG PO TABS: 25.0000 mg | ORAL_TABLET | Freq: Every day | ORAL | 3 refills | Status: DC

## 2017-06-07 MED ORDER — METOPROLOL SUCCINATE ER 100 MG PO TB24: ORAL_TABLET | ORAL | 3 refills | Status: DC

## 2017-06-07 MED ORDER — PRAVASTATIN SODIUM 20 MG PO TABS: 20.0000 mg | ORAL_TABLET | Freq: Every day | ORAL | 3 refills | Status: DC

## 2017-06-07 NOTE — Addendum Note (Signed)
Addended by: Lianne Cure A on: 06/07/2017 04:29 PM   Modules accepted: Orders

## 2017-06-07 NOTE — Patient Instructions (Signed)
Your physician wants you to follow-up in: ONE YEAR with Dr. Hilty. You will receive a reminder letter in the mail two months in advance. If you don't receive a letter, please call our office to schedule the follow-up appointment.  

## 2017-06-07 NOTE — Progress Notes (Signed)
OFFICE NOTE  Chief Complaint:  Follow-up hospitalization  Primary Care Physician: Kathyrn Lass, MD  HPI:  Regina James is a 84 y.o. female with a past medial history significant for PSVT, followed by Dr. Mare Ferrari in the past.  Recently she presented with a stroke, thought to be atheroembolic from the carotid arteries.  She underwent left carotid endarterectomy, complicated by hematoma requiring repeat surgery and evacuation.  Since then she has done well.  She is on full dose aspirin, losartan, metoprolol and pravastatin.  LDL cholesterol in July was 89.  Her goal LDL is less than 70.  PMHx:  Past Medical History:  Diagnosis Date  . Cancer ([place])    basal cell carcinoma  . History of PSVT (paroxysmal supraventricular tachycardia)    Reported back as far as 2010.  Marland Kitchen Palpitations   . Stroke [facility])     Past Surgical History:  Procedure Laterality Date  . APPENDECTOMY  1993  . CHOLECYSTECTOMY  1996  . ENDARTERECTOMY Left 11/03/2016   Procedure: Exploration of Carotid  ENDARTERECTOMY.;  Surgeon: Elam Dutch, MD;  Location: [place];  Service: Vascular;  Laterality: Left;  . ENDARTERECTOMY Left 11/03/2016   Procedure: ENDARTERECTOMY CAROTID;  Surgeon: Elam Dutch, MD;  Location: [facility];  Service: Vascular;  Laterality: Left;  . LOOP RECORDER INSERTION N/A 11/02/2016   Procedure: Loop Recorder Insertion;  Surgeon: [name], MD;  Location: [place] CV LAB;  Service: Cardiovascular;  Laterality: N/A;  . ROTATOR CUFF REPAIR Right 1990  . TEE WITHOUT CARDIOVERSION N/A 11/02/2016   Procedure: TRANSESOPHAGEAL ECHOCARDIOGRAM (TEE);  Surgeon: Lelon Perla, MD;  Location: [facility] ENDOSCOPY;  Service: Cardiovascular;  Laterality: N/A;  . US ECHOCARDIOGRAPHY  01/09/2009   EF 55-60%  . WRIST FRACTURE SURGERY      FAMHx:  Family History  Problem Relation Age of Onset  . Cancer Father        prostate  . Diabetes Father   . Varicose Veins Mother   . Cardiomyopathy Mother   .  Kidney cancer Brother   . Arrhythmia Brother     SOCHx:   reports that she quit smoking about a year ago. Her smoking use included cigarettes. She has a 11.25 pack-year smoking history. she has never used smokeless tobacco. She reports that she drinks about 4.2 oz of alcohol per week. She reports that she does not use drugs.  ALLERGIES:  Allergies  Allergen Reactions  . Morphine And Related Swelling  . Levofloxacin Anxiety and Other (See Comments)    Double vision    ROS: Pertinent items noted in HPI and remainder of comprehensive ROS otherwise negative.  HOME MEDS: Current Outpatient Medications on File Prior to Visit  Medication Sig Dispense Refill  . aspirin 325 MG tablet Take 1 tablet (325 mg total) by mouth daily.    . Doxylamine Succinate, Sleep, (SLEEP AID PO) Take by mouth.    . Multiple Vitamin (MULTI-VITAMINS) TABS Take by mouth.    Vladimir Faster Glycol-Propyl Glycol (SYSTANE OP) Place 1 drop into both eyes daily as needed (for dry eyes).     . traMADol (ULTRAM) 50 MG tablet      No current facility-administered medications on file prior to visit.     LABS/IMAGING: No results found for this or any previous visit (from the past 48 hour(s)). No results found.  LIPID PANEL:    Component Value Date/Time   CHOL 147 11/01/2016 0341   TRIG 71 11/04/2016 0358  HDL 42 11/01/2016 0341   CHOLHDL 3.5 11/01/2016 0341   VLDL 16 11/01/2016 0341   LDLCALC 89 11/01/2016 0341     WEIGHTS: Wt Readings from Last 3 Encounters:  06/07/17 163 lb 3.2 oz (74 kg)  06/02/17 164 lb (74.4 kg)  05/23/17 167 lb 6.4 oz (75.9 kg)    VITALS: BP 120/84   Pulse 63   Ht 5' 6.5" (1.689 m)   Wt 163 lb 3.2 oz (74 kg)   BMI 25.95 kg/m   EXAM: General appearance: alert and no distress Neck: no carotid bruit, no JVD and thyroid not enlarged, symmetric, no tenderness/mass/nodules Lungs: clear to auscultation bilaterally Heart: regular rate and rhythm Abdomen: soft, non-tender; bowel  sounds normal; no masses,  no organomegaly Extremities: extremities normal, atraumatic, no cyanosis or edema Pulses: 2+ and symmetric Skin: Skin color, texture, turgor normal. No rashes or lesions Neurologic: Grossly normal Psych: Pleasant  EKG: Normal sinus rhythm with sinus arrhythmia at 63, minimal voltage criteria for LVH, nonspecific ST and T wave changes- personally reviewed  ASSESSMENT: 1. History of PSVT 2. Recent stroke-likely secondary to atheroembolism 3. Carotid artery disease status post left CEA 4. Normal LV function on TEE with atrial septal aneurysm, but negative bubble study 5. Status post implanted loop recorder  PLAN: 1.   Mrs. Treadwell unfortunately had a stroke which is likely related to atheroembolism from the left carotid.  She underwent surgery.  She is followed by vascular surgery for this.  She had an implanted loop recorder which is not yet revealed any significant arrhythmias.  She is on appropriate medical therapy.  Her last LDL cholesterol is still not at goal and will need a repeat lipid profile.  For now we will continue with her current dose of statin.  Follow-up with me annually or sooner as necessary.  Pixie Casino, MD, [facility], [place] Director of the [facility]  Card[facility] Diplomate of the American Board of Clinical Lipidology Attending Cardiologist  Direct Dial: [phone]  Fax: [phone]  Website:  [email or web address] Joclyn Alsobrook 06/07/2017, 1:56 PM

## 2017-06-08 ENCOUNTER — Telehealth: Payer: Self-pay | Admitting: Internal Medicine

## 2017-06-08 NOTE — Telephone Encounter (Signed)
LM for patient that MD decided he would like her to have blood work done to assess cholesterol as her last check, LDL was not at goal. Will mail lab order.

## 2017-06-09 DIAGNOSIS — I639 Cerebral infarction, unspecified: Secondary | ICD-10-CM | POA: Diagnosis not present

## 2017-06-09 DIAGNOSIS — R296 Repeated falls: Secondary | ICD-10-CM | POA: Diagnosis not present

## 2017-06-09 DIAGNOSIS — M6281 Muscle weakness (generalized): Secondary | ICD-10-CM | POA: Diagnosis not present

## 2017-06-14 DIAGNOSIS — M6281 Muscle weakness (generalized): Secondary | ICD-10-CM | POA: Diagnosis not present

## 2017-06-14 DIAGNOSIS — I639 Cerebral infarction, unspecified: Secondary | ICD-10-CM | POA: Diagnosis not present

## 2017-06-14 DIAGNOSIS — R296 Repeated falls: Secondary | ICD-10-CM | POA: Diagnosis not present

## 2017-06-16 DIAGNOSIS — R296 Repeated falls: Secondary | ICD-10-CM | POA: Diagnosis not present

## 2017-06-16 DIAGNOSIS — M6281 Muscle weakness (generalized): Secondary | ICD-10-CM | POA: Diagnosis not present

## 2017-06-16 DIAGNOSIS — I639 Cerebral infarction, unspecified: Secondary | ICD-10-CM | POA: Diagnosis not present

## 2017-06-21 DIAGNOSIS — R296 Repeated falls: Secondary | ICD-10-CM | POA: Diagnosis not present

## 2017-06-21 DIAGNOSIS — I639 Cerebral infarction, unspecified: Secondary | ICD-10-CM | POA: Diagnosis not present

## 2017-06-21 DIAGNOSIS — M6281 Muscle weakness (generalized): Secondary | ICD-10-CM | POA: Diagnosis not present

## 2017-07-04 DIAGNOSIS — M6281 Muscle weakness (generalized): Secondary | ICD-10-CM | POA: Diagnosis not present

## 2017-07-04 DIAGNOSIS — I639 Cerebral infarction, unspecified: Secondary | ICD-10-CM | POA: Diagnosis not present

## 2017-07-04 DIAGNOSIS — R296 Repeated falls: Secondary | ICD-10-CM | POA: Diagnosis not present

## 2017-07-06 ENCOUNTER — Ambulatory Visit (INDEPENDENT_AMBULATORY_CARE_PROVIDER_SITE_OTHER): Payer: Medicare Other | Admitting: *Deleted

## 2017-07-06 DIAGNOSIS — I639 Cerebral infarction, unspecified: Secondary | ICD-10-CM

## 2017-07-06 DIAGNOSIS — R296 Repeated falls: Secondary | ICD-10-CM | POA: Diagnosis not present

## 2017-07-06 DIAGNOSIS — M6281 Muscle weakness (generalized): Secondary | ICD-10-CM | POA: Diagnosis not present

## 2017-07-07 NOTE — Progress Notes (Signed)
[facility] Summary Report / Loop Recorder 

## 2017-07-11 LAB — CUP PACEART REMOTE DEVICE CHECK
Date Time Interrogation Session: 20190301204013
Implantable Pulse Generator Implant Date: 20180731

## 2017-07-14 DIAGNOSIS — M25561 Pain in right knee: Secondary | ICD-10-CM | POA: Diagnosis not present

## 2017-07-14 DIAGNOSIS — M1711 Unilateral primary osteoarthritis, right knee: Secondary | ICD-10-CM | POA: Diagnosis not present

## 2017-08-04 DIAGNOSIS — Z6826 Body mass index (BMI) 26.0-26.9, adult: Secondary | ICD-10-CM | POA: Diagnosis not present

## 2017-08-04 DIAGNOSIS — I10 Essential (primary) hypertension: Secondary | ICD-10-CM | POA: Diagnosis not present

## 2017-08-04 DIAGNOSIS — R3915 Urgency of urination: Secondary | ICD-10-CM | POA: Diagnosis not present

## 2017-08-04 DIAGNOSIS — M25561 Pain in right knee: Secondary | ICD-10-CM | POA: Diagnosis not present

## 2017-08-08 ENCOUNTER — Ambulatory Visit (INDEPENDENT_AMBULATORY_CARE_PROVIDER_SITE_OTHER): Payer: Medicare Other | Admitting: *Deleted

## 2017-08-08 DIAGNOSIS — I639 Cerebral infarction, unspecified: Secondary | ICD-10-CM | POA: Diagnosis not present

## 2017-08-09 NOTE — Progress Notes (Signed)
[facility] Summary Report / Loop Recorder 

## 2017-08-10 LAB — CUP PACEART REMOTE DEVICE CHECK
Date Time Interrogation Session: 20190403213949
Implantable Pulse Generator Implant Date: 20180731

## 2017-08-16 DIAGNOSIS — M25561 Pain in right knee: Secondary | ICD-10-CM | POA: Diagnosis not present

## 2017-08-18 DIAGNOSIS — M1711 Unilateral primary osteoarthritis, right knee: Secondary | ICD-10-CM | POA: Diagnosis not present

## 2017-08-25 DIAGNOSIS — M1711 Unilateral primary osteoarthritis, right knee: Secondary | ICD-10-CM | POA: Diagnosis not present

## 2017-08-30 LAB — CUP PACEART REMOTE DEVICE CHECK
Date Time Interrogation Session: 20190506220739
Implantable Pulse Generator Implant Date: 20180731

## 2017-09-01 DIAGNOSIS — M1711 Unilateral primary osteoarthritis, right knee: Secondary | ICD-10-CM | POA: Diagnosis not present

## 2017-09-12 ENCOUNTER — Ambulatory Visit (INDEPENDENT_AMBULATORY_CARE_PROVIDER_SITE_OTHER): Payer: Medicare Other | Admitting: *Deleted

## 2017-09-12 DIAGNOSIS — I639 Cerebral infarction, unspecified: Secondary | ICD-10-CM

## 2017-09-12 NOTE — Progress Notes (Signed)
[facility] Summary Report / Loop Recorder 

## 2017-09-23 DIAGNOSIS — Z1389 Encounter for screening for other disorder: Secondary | ICD-10-CM | POA: Diagnosis not present

## 2017-09-23 DIAGNOSIS — E782 Mixed hyperlipidemia: Secondary | ICD-10-CM | POA: Diagnosis not present

## 2017-09-23 DIAGNOSIS — I700 Atherosclerosis of aorta: Secondary | ICD-10-CM | POA: Diagnosis not present

## 2017-09-23 DIAGNOSIS — F17210 Nicotine dependence, cigarettes, uncomplicated: Secondary | ICD-10-CM | POA: Diagnosis not present

## 2017-09-23 DIAGNOSIS — I712 Thoracic aortic aneurysm, without rupture: Secondary | ICD-10-CM | POA: Diagnosis not present

## 2017-09-23 DIAGNOSIS — Z0000 Encounter for general adult medical examination without abnormal findings: Secondary | ICD-10-CM | POA: Diagnosis not present

## 2017-09-23 DIAGNOSIS — N183 Chronic kidney disease, stage 3 (moderate): Secondary | ICD-10-CM | POA: Diagnosis not present

## 2017-09-23 DIAGNOSIS — I129 Hypertensive chronic kidney disease with stage 1 through stage 4 chronic kidney disease, or unspecified chronic kidney disease: Secondary | ICD-10-CM | POA: Diagnosis not present

## 2017-09-23 DIAGNOSIS — Z6827 Body mass index (BMI) 27.0-27.9, adult: Secondary | ICD-10-CM | POA: Diagnosis not present

## 2017-09-27 ENCOUNTER — Other Ambulatory Visit: Payer: Self-pay | Admitting: Family Medicine

## 2017-09-27 DIAGNOSIS — Z1231 Encounter for screening mammogram for malignant neoplasm of breast: Secondary | ICD-10-CM

## 2017-10-13 ENCOUNTER — Ambulatory Visit (INDEPENDENT_AMBULATORY_CARE_PROVIDER_SITE_OTHER): Payer: Medicare Other | Admitting: *Deleted

## 2017-10-13 DIAGNOSIS — H353131 Nonexudative age-related macular degeneration, bilateral, early dry stage: Secondary | ICD-10-CM | POA: Diagnosis not present

## 2017-10-13 DIAGNOSIS — I639 Cerebral infarction, unspecified: Secondary | ICD-10-CM

## 2017-10-13 DIAGNOSIS — Z961 Presence of intraocular lens: Secondary | ICD-10-CM | POA: Diagnosis not present

## 2017-10-13 DIAGNOSIS — H43813 Vitreous degeneration, bilateral: Secondary | ICD-10-CM | POA: Diagnosis not present

## 2017-10-14 NOTE — Progress Notes (Signed)
[facility] Summary Report / Loop Recorder 

## 2017-10-18 LAB — CUP PACEART REMOTE DEVICE CHECK
Date Time Interrogation Session: 20190608224103
Implantable Pulse Generator Implant Date: 20180731

## 2017-10-26 ENCOUNTER — Telehealth: Payer: Self-pay | Admitting: Cardiology

## 2017-10-26 NOTE — Telephone Encounter (Signed)
LMOVM requesting that pt send manual transmission b/c home monitor has not updated in at least 14 days.    

## 2017-10-31 ENCOUNTER — Telehealth: Payer: Self-pay

## 2017-10-31 NOTE — Telephone Encounter (Signed)
Pt returned [place] call. I informed her to send a manual transmission from her home monitor.

## 2017-10-31 NOTE — Telephone Encounter (Signed)
Phone advice

## 2017-11-01 ENCOUNTER — Ambulatory Visit: Payer: Medicare Other

## 2017-11-02 ENCOUNTER — Ambulatory Visit
Admission: RE | Admit: 2017-11-02 | Discharge: 2017-11-02 | Disposition: A | Discharge status: Home / Self Care | Payer: Medicare Other | Source: Ambulatory Visit | Attending: Family Medicine | Admitting: Family Medicine

## 2017-11-02 DIAGNOSIS — Z1231 Encounter for screening mammogram for malignant neoplasm of breast: Secondary | ICD-10-CM | POA: Diagnosis not present

## 2017-11-15 ENCOUNTER — Ambulatory Visit (INDEPENDENT_AMBULATORY_CARE_PROVIDER_SITE_OTHER): Payer: Medicare Other | Admitting: *Deleted

## 2017-11-15 DIAGNOSIS — I639 Cerebral infarction, unspecified: Secondary | ICD-10-CM | POA: Diagnosis not present

## 2017-11-16 NOTE — Progress Notes (Signed)
[facility] Summary Report / Loop Recorder 

## 2017-11-24 ENCOUNTER — Other Ambulatory Visit: Payer: Medicare Other

## 2017-11-24 LAB — CUP PACEART REMOTE DEVICE CHECK
Date Time Interrogation Session: 20190711223905
Implantable Pulse Generator Implant Date: 20180731

## 2017-12-01 ENCOUNTER — Ambulatory Visit: Payer: Medicare Other | Admitting: Family

## 2017-12-01 ENCOUNTER — Encounter (HOSPITAL_COMMUNITY): Payer: Medicare Other

## 2017-12-19 ENCOUNTER — Ambulatory Visit (INDEPENDENT_AMBULATORY_CARE_PROVIDER_SITE_OTHER): Payer: Medicare Other | Admitting: *Deleted

## 2017-12-19 DIAGNOSIS — I639 Cerebral infarction, unspecified: Secondary | ICD-10-CM

## 2017-12-19 NOTE — Progress Notes (Signed)
[facility] Summary Report / Loop Recorder 

## 2017-12-22 LAB — CUP PACEART REMOTE DEVICE CHECK
Date Time Interrogation Session: 20190813234111
Implantable Pulse Generator Implant Date: 20180731

## 2017-12-26 DIAGNOSIS — Z23 Encounter for immunization: Secondary | ICD-10-CM | POA: Diagnosis not present

## 2018-01-01 LAB — CUP PACEART REMOTE DEVICE CHECK
Date Time Interrogation Session: 20190916000553
Implantable Pulse Generator Implant Date: 20180731

## 2018-01-19 DIAGNOSIS — Z6827 Body mass index (BMI) 27.0-27.9, adult: Secondary | ICD-10-CM | POA: Diagnosis not present

## 2018-01-19 DIAGNOSIS — J019 Acute sinusitis, unspecified: Secondary | ICD-10-CM | POA: Diagnosis not present

## 2018-01-19 DIAGNOSIS — F17210 Nicotine dependence, cigarettes, uncomplicated: Secondary | ICD-10-CM | POA: Diagnosis not present

## 2018-01-19 DIAGNOSIS — R05 Cough: Secondary | ICD-10-CM | POA: Diagnosis not present

## 2018-01-20 ENCOUNTER — Ambulatory Visit (INDEPENDENT_AMBULATORY_CARE_PROVIDER_SITE_OTHER): Payer: Medicare Other | Admitting: *Deleted

## 2018-01-20 DIAGNOSIS — I639 Cerebral infarction, unspecified: Secondary | ICD-10-CM | POA: Diagnosis not present

## 2018-01-23 NOTE — Progress Notes (Signed)
[facility] Summary Report / Loop Recorder 

## 2018-02-07 LAB — CUP PACEART REMOTE DEVICE CHECK
Date Time Interrogation Session: 20191019004002
Implantable Pulse Generator Implant Date: 20180731

## 2018-02-14 ENCOUNTER — Ambulatory Visit (HOSPITAL_COMMUNITY)
Admission: RE | Admit: 2018-02-14 | Discharge: 2018-02-14 | Disposition: A | Discharge status: Home / Self Care | Payer: Medicare Other | Source: Ambulatory Visit | Attending: Family | Admitting: Family

## 2018-02-14 DIAGNOSIS — I771 Stricture of artery: Secondary | ICD-10-CM | POA: Diagnosis not present

## 2018-02-14 DIAGNOSIS — I6522 Occlusion and stenosis of left carotid artery: Secondary | ICD-10-CM

## 2018-02-16 ENCOUNTER — Encounter: Payer: Self-pay | Admitting: Family

## 2018-02-16 ENCOUNTER — Ambulatory Visit (INDEPENDENT_AMBULATORY_CARE_PROVIDER_SITE_OTHER): Payer: Medicare Other | Admitting: Family

## 2018-02-16 VITALS — BP 152/84 | HR 61 | Temp 96.9°F | Resp 18 | Ht 66.5 in | Wt 167.0 lb

## 2018-02-16 DIAGNOSIS — Z9889 Other specified postprocedural states: Secondary | ICD-10-CM

## 2018-02-16 DIAGNOSIS — I6523 Occlusion and stenosis of bilateral carotid arteries: Secondary | ICD-10-CM | POA: Diagnosis not present

## 2018-02-16 DIAGNOSIS — I712 Thoracic aortic aneurysm, without rupture: Secondary | ICD-10-CM | POA: Diagnosis not present

## 2018-02-16 DIAGNOSIS — I6522 Occlusion and stenosis of left carotid artery: Secondary | ICD-10-CM

## 2018-02-16 DIAGNOSIS — I7121 Aneurysm of the ascending aorta, without rupture: Secondary | ICD-10-CM

## 2018-02-16 NOTE — Progress Notes (Signed)
Chief Complaint: Follow up Extracranial Carotid Artery Stenosis   History of Present Illness  Regina James is a 84 y.o. female who is s/p left carotid endarterectomy on 11-03-16 by Dr. Oneida Alar. She had a preoperative stroke in August 2018 as manifested by right hemiparesis. She states she had another stroke during the left CEA as manifested by speech difficulties.  She still has some residual right side weakness in her leg and some discoordination of her right hand.  She denies any residual speech difficulties or hoarseness.   Of note she has a known left subclavian artery stenosis which has been asymptomatic. Dr. Valinda Hoar discussed with her that she should always have the blood pressure measured in her right arm. She was noted to have a 30 mm gradient between left and right arms at her visit on 11-18-16. Left subclavian stenosis was about 80%.  Also she has a history of an ascending aortic aneurysm. This recently measured about 4.4 cm. She has a a penetrating aortic ulcer of about 15 mm. This was seen on CT scan August 2018. While the patient was in the hospital Dr. Oneida Alar discussed these findings with Dr. Servando Snare from cardiac surgery. In light of the patient's age and current problems with recent strokes most likely observation at this point.  Dr. Oneida Alar last evaluated pt on 11-18-16. At that time his note indicated that pt needs repeat carotid duplex scan and see our nurse practitioner in 6 months time. Continue Plavix and aspirin as well as her statin. Ascending aortic aneurysm. This is 4.4 cm in diameter. Consideration could be given for repair and would need evaluation by cardiac surgery but will leave this at the discretion of her primary care physician if they wish to refer for this. Patient's prior CT scan has been reviewed by Dr. Servando Snare in the past. Penetrating aortic ulcer 15 mm diameter; Dr. Oneida Alar note from August 2018 indicates that pt probably needs repeat CT scan of this in one  year  She denies any chest pain or dyspnea.    She does not seem to have claudication in her legs walking.  She had to cancel the CTA chest due to having to visit her brother who is ill in [place]. Pt wants to wait until after April 05, 2018, to reschedule CTA chest, as she is still having to see him fairly often.   Diabetic: No, A1C was 5.7 on 11-06-16, serum creatinine on 11-06-16 was normal at 0.55 Tobacco use: former smoker, quit 06-10-16, smoked x 45 years  Pt meds include: Statin : yes ASA: yes, 325 mg daily Other anticoagulants/antiplatelets: no    Past Medical History:  Diagnosis Date  . Cancer ([place])    basal cell carcinoma  . History of PSVT (paroxysmal supraventricular tachycardia)    Reported back as far as 2010.  Marland Kitchen Palpitations   . Stroke [facility])     Social History Social History   Tobacco Use  . Smoking status: Former Smoker    Packs/day: 0.25    Years: 45.00    Pack years: 11.25    Types: Cigarettes    Last attempt to quit: 06/10/2016    Years since quitting: 1.6  . Smokeless tobacco: Never Used  Substance Use Topics  . Alcohol use: Yes    Alcohol/week: 7.0 standard drinks    Types: 7 Glasses of wine per week    Comment: 1 glass wine daily  . Drug use: No    Family History Family History  Problem  Relation Age of Onset  . Cancer Father        prostate  . Diabetes Father   . Varicose Veins Mother   . Cardiomyopathy Mother   . Kidney cancer Brother   . Arrhythmia Brother     Surgical History Past Surgical History:  Procedure Laterality Date  . APPENDECTOMY  1993  . CHOLECYSTECTOMY  1996  . ENDARTERECTOMY Left 11/03/2016   Procedure: Exploration of Carotid  ENDARTERECTOMY.;  Surgeon: Elam Dutch, MD;  Location: [place];  Service: Vascular;  Laterality: Left;  . ENDARTERECTOMY Left 11/03/2016   Procedure: ENDARTERECTOMY CAROTID;  Surgeon: Elam Dutch, MD;  Location: [place];  Service: Vascular;  Laterality: Left;  . LOOP RECORDER  INSERTION N/A 11/02/2016   Procedure: Loop Recorder Insertion;  Surgeon: [name], MD;  Location: [place] CV LAB;  Service: Cardiovascular;  Laterality: N/A;  . ROTATOR CUFF REPAIR Right 1990  . TEE WITHOUT CARDIOVERSION N/A 11/02/2016   Procedure: TRANSESOPHAGEAL ECHOCARDIOGRAM (TEE);  Surgeon: Lelon Perla, MD;  Location: [facility] ENDOSCOPY;  Service: Cardiovascular;  Laterality: N/A;  . US ECHOCARDIOGRAPHY  01/09/2009   EF 55-60%  . WRIST FRACTURE SURGERY      Allergies  Allergen Reactions  . Morphine And Related Swelling  . Levofloxacin Anxiety and Other (See Comments)    Double vision    Current Outpatient Medications  Medication Sig Dispense Refill  . aspirin 325 MG tablet Take 1 tablet (325 mg total) by mouth daily.    . Doxylamine Succinate, Sleep, (SLEEP AID PO) Take by mouth.    . losartan (COZAAR) 25 MG tablet Take 1 tablet (25 mg total) by mouth daily. 90 tablet 3  . metoprolol succinate (TOPROL-XL) 100 MG 24 hr tablet Take  0.5 tablet ( 50 mg total) once daily 45 tablet 3  . Multiple Vitamin (MULTI-VITAMINS) TABS Take by mouth.    Vladimir Faster Glycol-Propyl Glycol (SYSTANE OP) Place 1 drop into both eyes daily as needed (for dry eyes).     . pravastatin (PRAVACHOL) 20 MG tablet Take 1 tablet (20 mg total) by mouth daily at 6 PM. 90 tablet 3  . traMADol (ULTRAM) 50 MG tablet      No current facility-administered medications for this visit.     Review of Systems : See HPI for pertinent positives and negatives.  Physical Examination  Vitals:   02/16/18 1318  BP: (!) 152/84  Pulse: 61  Resp: 18  Temp: (!) 96.9 F (36.1 C)  TempSrc: Oral  SpO2: 99%  Weight: 167 lb (75.8 kg)  Height: 5' 6.5" (1.689 m)   Body mass index is 26.55 kg/m.  General: WDWN female in NAD GAIT: normal Eyes: PERRLA HENT: No gross abnormalities.  Pulmonary:  Respirations are non-labored, good air movement, CTAB, no rales,  rhonchi, or wheezing. Cardiac: regular rhythm,  +murmur.  VASCULAR EXAM Carotid Bruits Right Left   Negative Negative     Abdominal aortic pulse is not palpable. Radial pulses: 2+ right, 1+ left palpable  LE Pulses Right Left       POPLITEAL  not palpable   not palpable       POSTERIOR TIBIAL  not palpable   not palpable        DORSALIS PEDIS      ANTERIOR TIBIAL faintly palpable  not palpable     Gastrointestinal: soft, nontender, BS WNL, no r/g, no palpable masses. Musculoskeletal: No muscle atrophy/wasting. M/S 5/5 throughout, extremities without ischemic changes. Skin: No rashes, no ulcers, no cellulitis.   Neurologic:  A&O X 3; appropriate affect, sensation is normal; speech is normal, CN 2-12 intact, pain and light touch intact in extremities, motor exam as listed above. Psychiatric: Normal thought content, mood appropriate to clinical situation.     Assessment: ANNALISA COLONNA is a 84 y.o. female who is s/p left carotid endarterectomy on 11-03-16 by Dr. Oneida Alar. She had a preoperative stroke in August 2018 as manifested by right hemiparesis. She states she had another stroke during the left CEA. She has not had any subsequent stroke or TIA.  She has an ascending aortic aneurysm and penetrating aortic ulcer; see Plan.   She smoked x 45 years, quit in March 2018.    DATA  Carotid Duplex (02-14-18): Right Carotid: Velocities in the right ICA are consistent with a 1-39% stenosis.        Non-hemodynamically significant plaque <50% noted in the CCA. The        ECA appears <50% stenosed.  Left Carotid: Non-hemodynamically significant plaque noted in the CCA. The ECA       appears <50% stenosed. Patent left carotid endarterectomy site       with a 40-59% internal carotid artery stenosis.  Vertebrals: Bilateral vertebral arteries demonstrate  antegrade flow. Subclavians: Normal flow hemodynamics were seen in the right subclavian artery.       Left subclavian artery demonstrated monophasic flow. Increased stenosis of the left ICA compared to the exam on 06-02-17.     CT Chest w/o contrast (09-22-16), requested by Dr. Kathyrn Lass for re-evaluation of pulmonary nodules IMPRESSION: 1. Bilateral subpleural pulmonary nodules are similar back to 05/03/2014 and can be presumed benign. 2. Coronary artery atherosclerosis. Aortic Atherosclerosis (ICD10-I70.0). 3. Similar ascending aortic aneurysm, maximally 4.4 cm. Recommend annual imaging followup by CTA or MRA. This recommendation follows 2010 ACCF/AHA/AATS/ACR/ASA/SCA/SCAI/SIR/STS/SVM Guidelines for the Diagnosis and Management of Patients with Thoracic Aortic Disease. Circulation. 2010; 121: C588-F027    Plan:   Follow up CTA chest to monitor ascending aortic aneurysm, maximally 4.4 cm on 09-22-16 CTA chest , pt to call us when we may schedule this ASAP,  and carotid duplex in a year. She had to cancel the CTA chest due to having to visit her brother who is ill in [place]. Pt wants to wait until after April 05, 2018, to reschedule CTA chest, as she is still having to see him fairly often.    I discussed in depth with the patient the nature of atherosclerosis, and emphasized the importance of maximal medical management including strict control of blood pressure, blood glucose, and lipid levels, obtaining regular exercise, and continued cessation of smoking.  The patient is aware that without maximal medical management the underlying atherosclerotic disease process will progress, limiting the benefit of any interventions. The patient was given information about stroke prevention and what symptoms should prompt the patient to seek immediate medical care. Thank you for allowing [place] to participate in this patient's care.  Vinnie Level Nickel, RN, MSN, FNP-C [facility] [place] Office: [phone]  Clinic Physician: Oneida Alar  02/16/18 1:26 PM

## 2018-02-16 NOTE — Patient Instructions (Addendum)
Thoracic Aortic Aneurysm An aneurysm is a bulge in an artery. It happens when blood pushes up against a weakened or damaged artery wall. A thoracic aortic aneurysm is an aneurysm that occurs in the first part of the aorta, between the heart and the diaphragm. The aorta is the main artery of the body. It supplies blood from the heart to the rest of the body. Some aneurysms may not cause symptoms or problems. However, the major concern with a thoracic aortic aneurysm is that it can enlarge and burst (rupture), or blood can flow between the layers of the wall of the aorta through a tear (aorticdissection). Both of these conditions can cause bleeding inside the body and can be life-threatening if they are not diagnosed and treated right away. What are the causes? The exact cause of this condition is not known. What increases the risk? The following factors may make you more likely to develop this condition:  Being age 55 or older.  Having a hardening of the arteries caused by the buildup of fat and other substances in the lining of a blood vessel (arteriosclerosis).  Having inflammation of the walls of an artery (arteritis).  Having a genetic disease that weakens the body's connective tissue, such as Marfan syndrome.  Having an injury or trauma to the aorta.  Having an infection that is caused by bacteria, such as syphilis or staphylococcus, in the wall of the aorta (infectious aortitis).  Having high blood pressure (hypertension).  Being female.  Being white (Caucasian).  Having high cholesterol.  Having a family history of aneurysms.  Using tobacco.  Having chronic obstructive pulmonary disease (COPD).  What are the signs or symptoms? Symptoms of this condition vary depending on the size and rate of growth of the aneurysm. Most grow slowly and do not cause any symptoms. When symptoms do occur, they may include:  Pain in the chest, back, sides, or abdomen. The pain may vary in  intensity. A sudden onset of severe pain may indicate that the aneurysm has ruptured.  Hoarseness.  Cough.  Shortness of breath.  Swallowing problems.  Swelling in the face, arms, or legs.  Fever.  Unexplained weight loss.  How is this diagnosed? This condition may be diagnosed with:  An ultrasound.  X-rays.  A CT scan.  An MRI.  Tests to check the arteries for damage or blockages (angiogram).  Most unruptured thoracic aortic aneurysms cause no symptoms, so they are often found during exams for other conditions. How is this treated? Treatment for this condition depends on:  The size of the aneurysm.  How fast the aneurysm is growing.  Your age.  Risk factors for rupture.  Aneurysms that are smaller than 2.2 inches (5.5 cm) may be managed by using medicines to control blood pressure, manage pain, or fight infection. You may need regular monitoring to see if the aneurysm is getting bigger. Your health care provider may recommend that you have an ultrasound every year or every 6 months. How often you need to have an ultrasound depends on the size of the aneurysm, how fast it is growing, and whether you have a family history of aneurysms. Surgical repair may be needed if your aneurysm is larger than 2.2 inches or if it is growing quickly. Follow these instructions at home: Eating and drinking  Eat a healthy diet. Your health care provider may recommend that you: ? Lower your salt (sodium) intake. In some people, too much salt can raise blood pressure and increase  the risk of thoracic aortic aneurysm. ? Avoid foods that are high in saturated fat and cholesterol, such as red meat and dairy. ? Eat a diet that is low in sugar. ? Increase your fiber intake by including whole grains, vegetables, and fruits in your diet. Eating these foods may help to lower blood pressure.  Limit or avoid alcohol as recommended by your health care provider. Lifestyle  Follow instructions  from your health care provider about healthy lifestyle habits. Your health care provider may recommend that you: ? Do not use any products that contain nicotine or tobacco, such as cigarettes and e-cigarettes. If you need help quitting, ask your health care provider. ? Keep your blood pressure within normal limits. The target limit for most people is below 120/80. Check your blood pressure regularly. If it is high, ask your health care provider about ways that you can control it. ? Keep your blood sugar (glucose) level and cholesterol levels within normal limits. Target limits for most people are:  Blood glucose level: Less than 100 mg/dL.  Total cholesterol level: Less than 200 mg/dL. ? Maintain a healthy weight. Activity  Stay physically active and exercise regularly. Talk with your health care provider about how often you should exercise and ask which types of exercise are safe for you.  Avoid heavy lifting and activities that take a lot of effort (are strenuous). Ask your health care provider what activities are safe for you. General instructions  Keep all follow-up visits as told by your health care provider. This is important. ? Talk with your health care provider about regular screenings to see if the aneurysm is getting bigger.  Take over-the-counter and prescription medicines only as told by your health care provider. Contact a health care provider if:  You have discomfort in your upper back, neck, or abdomen.  You have trouble swallowing.  You have a cough or hoarseness.  You have a family history of aneurysms.  You have unexplained weight loss. Get help right away if:  You have sudden, severe pain in your upper back and abdomen. This pain may move into your chest and arms.  You have shortness of breath.  You have a fever. This information is not intended to replace advice given to you by your health care provider. Make sure you discuss any questions you have with your  health care provider. Document Released: 03/22/2005 Document Revised: 01/02/2016 Document Reviewed: 01/02/2016 Elsevier Interactive Patient Education  2018 [employer].    Stroke Prevention Some health problems and behaviors may make it more likely for you to have a stroke. Below are ways to lessen your risk of having a stroke.  Be active for at least 30 minutes on most or all days.  Do not smoke. Try not to be around others who smoke.  Do not drink too much alcohol. ? Do not have more than 2 drinks a day if you are a man. ? Do not have more than 1 drink a day if you are a woman and are not pregnant.  Eat healthy foods, such as fruits and vegetables. If you were put on a specific diet, follow the diet as told.  Keep your cholesterol levels under control through diet and medicines. Look for foods that are low in saturated fat, trans fat, cholesterol, and are high in fiber.  If you have diabetes, follow all diet plans and take your medicine as told.  Ask your doctor if you need treatment to lower your blood  pressure. If you have high blood pressure (hypertension), follow all diet plans and take your medicine as told by your doctor.  If you are 59-84 years old, have your blood pressure checked every 3-5 years. If you are age 26 or older, have your blood pressure checked every year.  Keep a healthy weight. Eat foods that are low in calories, salt, saturated fat, trans fat, and cholesterol.  Do not take drugs.  Avoid birth control pills, if this applies. Talk to your doctor about the risks of taking birth control pills.  Talk to your doctor if you have sleep problems (sleep apnea).  Take all medicine as told by your doctor. ? You may be told to take aspirin or blood thinner medicine. Take this medicine as told by your doctor. ? Understand your medicine instructions.  Make sure any other conditions you have are being taken care of.  Get help right away if:  You suddenly lose  feeling (you feel numb) or have weakness in your face, arm, or leg.  Your face or eyelid hangs down to one side.  You suddenly feel confused.  You have trouble talking (aphasia) or understanding what people are saying.  You suddenly have trouble seeing in one or both eyes.  You suddenly have trouble walking.  You are dizzy.  You lose your balance or your movements are clumsy (uncoordinated).  You suddenly have a very bad headache and you do not know the cause.  You have new chest pain.  Your heart feels like it is fluttering or skipping a beat (irregular heartbeat). Do not wait to see if the symptoms above go away. Get help right away. Call your local emergency services (911 in [place] Do not drive yourself to the hospital. This information is not intended to replace advice given to you by your health care provider. Make sure you discuss any questions you have with your health care provider. Document Released: 09/21/2011 Document Revised: 08/28/2015 Document Reviewed: 09/22/2012 Elsevier Interactive Patient Education  [employer].

## 2018-02-22 ENCOUNTER — Ambulatory Visit (INDEPENDENT_AMBULATORY_CARE_PROVIDER_SITE_OTHER): Payer: Medicare Other

## 2018-02-22 DIAGNOSIS — I639 Cerebral infarction, unspecified: Secondary | ICD-10-CM | POA: Diagnosis not present

## 2018-02-23 NOTE — Progress Notes (Signed)
[facility] Summary Report / Loop Recorder 

## 2018-03-27 ENCOUNTER — Ambulatory Visit (INDEPENDENT_AMBULATORY_CARE_PROVIDER_SITE_OTHER): Payer: Medicare Other

## 2018-03-27 DIAGNOSIS — I639 Cerebral infarction, unspecified: Secondary | ICD-10-CM

## 2018-03-28 LAB — CUP PACEART REMOTE DEVICE CHECK
Date Time Interrogation Session: 20191224004013
Implantable Pulse Generator Implant Date: 20180731

## 2018-03-28 NOTE — Progress Notes (Signed)
[facility] Summary Report / Loop Recorder 

## 2018-04-09 LAB — CUP PACEART REMOTE DEVICE CHECK
Date Time Interrogation Session: 20191121003925
Implantable Pulse Generator Implant Date: 20180731

## 2018-05-01 ENCOUNTER — Ambulatory Visit (INDEPENDENT_AMBULATORY_CARE_PROVIDER_SITE_OTHER): Payer: Medicare Other

## 2018-05-01 DIAGNOSIS — I639 Cerebral infarction, unspecified: Secondary | ICD-10-CM | POA: Diagnosis not present

## 2018-05-02 LAB — CUP PACEART REMOTE DEVICE CHECK
Date Time Interrogation Session: 20200126010937
Implantable Pulse Generator Implant Date: 20180731

## 2018-05-02 NOTE — Progress Notes (Signed)
[facility] Summary Report / Loop Recorder 

## 2018-06-01 ENCOUNTER — Ambulatory Visit (INDEPENDENT_AMBULATORY_CARE_PROVIDER_SITE_OTHER): Payer: Medicare Other | Admitting: *Deleted

## 2018-06-01 DIAGNOSIS — I639 Cerebral infarction, unspecified: Secondary | ICD-10-CM

## 2018-06-02 LAB — CUP PACEART REMOTE DEVICE CHECK
Date Time Interrogation Session: 20200228013525
Implantable Pulse Generator Implant Date: 20180731

## 2018-06-07 NOTE — Progress Notes (Signed)
[facility] Summary Report / Loop Recorder 

## 2018-06-08 ENCOUNTER — Encounter: Payer: Self-pay | Admitting: Internal Medicine

## 2018-06-08 ENCOUNTER — Ambulatory Visit (INDEPENDENT_AMBULATORY_CARE_PROVIDER_SITE_OTHER): Payer: Medicare Other | Admitting: Internal Medicine

## 2018-06-08 ENCOUNTER — Other Ambulatory Visit: Payer: Self-pay | Admitting: Internal Medicine

## 2018-06-08 ENCOUNTER — Telehealth: Payer: Self-pay | Admitting: Internal Medicine

## 2018-06-08 VITALS — BP 120/62 | HR 62 | Ht 66.0 in | Wt 171.4 lb

## 2018-06-08 DIAGNOSIS — I471 Supraventricular tachycardia: Secondary | ICD-10-CM

## 2018-06-08 DIAGNOSIS — Z79899 Other long term (current) drug therapy: Secondary | ICD-10-CM

## 2018-06-08 DIAGNOSIS — E782 Mixed hyperlipidemia: Secondary | ICD-10-CM

## 2018-06-08 DIAGNOSIS — I639 Cerebral infarction, unspecified: Secondary | ICD-10-CM

## 2018-06-08 DIAGNOSIS — Z9889 Other specified postprocedural states: Secondary | ICD-10-CM

## 2018-06-08 DIAGNOSIS — I771 Stricture of artery: Secondary | ICD-10-CM

## 2018-06-08 NOTE — Progress Notes (Signed)
OFFICE NOTE  Chief Complaint:  Follow-up stroke  Primary Care Physician: Kathyrn Lass, MD  HPI:  Regina James is a 85 y.o. female with a past medial history significant for PSVT, followed by Dr. Mare Ferrari in the past.  Recently she presented with a stroke, thought to be atheroembolic from the carotid arteries.  She underwent left carotid endarterectomy, complicated by hematoma requiring repeat surgery and evacuation.  Since then she has done well.  She is on full dose aspirin, losartan, metoprolol and pravastatin.  LDL cholesterol in July was 89.  Her goal LDL is less than 70.  06/08/2018  Regina James returns today for follow-up of stroke and SVT.  She denies any recurrent SVT and has had no further stroke or TIA symptoms.  Her most recent lipid profile was abnormal in June 2019 showing total cholesterol 173, HDL 43, LDL at goal of 69 however triglycerides remain elevated at 303.  She is on moderate intensity pravastatin 20 mg daily.  We discussed the newer data regarding the REDUCE-IT trial suggesting that given her history of stroke and PAD with carotid artery disease, she is at higher risk of events particularly with persistent cardiovascular risk associated with elevated triglycerides.  PMHx:  Past Medical History:  Diagnosis Date  . Cancer ([place])    basal cell carcinoma  . History of PSVT (paroxysmal supraventricular tachycardia)    Reported back as far as 2010.  Marland Kitchen Palpitations   . Stroke [facility])     Past Surgical History:  Procedure Laterality Date  . APPENDECTOMY  1993  . CHOLECYSTECTOMY  1996  . ENDARTERECTOMY Left 11/03/2016   Procedure: Exploration of Carotid  ENDARTERECTOMY.;  Surgeon: Elam Dutch, MD;  Location: [place];  Service: Vascular;  Laterality: Left;  . ENDARTERECTOMY Left 11/03/2016   Procedure: ENDARTERECTOMY CAROTID;  Surgeon: Elam Dutch, MD;  Location: [place];  Service: Vascular;  Laterality: Left;  . LOOP RECORDER INSERTION N/A 11/02/2016   Procedure:  Loop Recorder Insertion;  Surgeon: [name], MD;  Location: [place] [facility];  Service: Cardiovascular;  Laterality: N/A;  . ROTATOR CUFF REPAIR Right 1990  . TEE WITHOUT CARDIOVERSION N/A 11/02/2016   Procedure: TRANSESOPHAGEAL ECHOCARDIOGRAM (TEE);  Surgeon: Lelon Perla, MD;  Location: [facility] END[facility];  Service: Cardiovascular;  Laterality: N/A;  . US ECHOCARDIOGRAPHY  01/09/2009   EF 55-60%  . WRIST FRACTURE SURGERY      FAMHx:  Family History  Problem Relation Age of Onset  . Cancer Father        prostate  . Diabetes Father   . Varicose Veins Mother   . Cardiomyopathy Mother   . Kidney cancer Brother   . Arrhythmia Brother     SOCHx:   reports that she quit smoking about 1 years ago. Her smoking use included cigarettes. She has a 11.25 pack-year smoking history. She has never used smokeless tobacco. She reports current alcohol use of about 7.0 standard drinks of alcohol per week. She reports that she does not use drugs.  ALLERGIES:  Allergies  Allergen Reactions  . Morphine And Related Swelling  . Levofloxacin Anxiety and Other (See Comments)    Double vision    ROS: Pertinent items noted in HPI and remainder of comprehensive ROS otherwise negative.  HOME MEDS: Current Outpatient Medications on File Prior to Visit  Medication Sig Dispense Refill  . aspirin 325 MG tablet Take 1 tablet (325 mg total) by mouth daily.    . Doxylamine Succinate, Sleep, (SLEEP  AID PO) Take by mouth.    . losartan (COZAAR) 25 MG tablet Take 1 tablet (25 mg total) by mouth daily. 90 tablet 3  . metoprolol succinate (TOPROL-XL) 100 MG 24 hr tablet Take  0.5 tablet ( 50 mg total) once daily 45 tablet 3  . Multiple Vitamin (MULTI-VITAMINS) TABS Take by mouth.    Vladimir Faster Glycol-Propyl Glycol (SYSTANE OP) Place 1 drop into both eyes daily as needed (for dry eyes).     . traMADol (ULTRAM) 50 MG tablet     . pravastatin (PRAVACHOL) 20 MG tablet Take 1 tablet (20 mg total) by mouth daily  at 6 PM. 90 tablet 3   No current facility-administered medications on file prior to visit.     LABS/IMAGING: No results found for this or any previous visit (from the past 48 hour(s)). No results found.  LIPID PANEL:    Component Value Date/Time   CHOL 147 11/01/2016 0341   TRIG 71 11/04/2016 0358   HDL 42 11/01/2016 0341   CHOLHDL 3.5 11/01/2016 0341   VLDL 16 11/01/2016 0341   LDLCALC 89 11/01/2016 0341     WEIGHTS: Wt Readings from Last 3 Encounters:  06/08/18 171 lb 6.4 oz (77.7 kg)  02/16/18 167 lb (75.8 kg)  06/07/17 163 lb 3.2 oz (74 kg)    VITALS: BP 120/62 (BP Location: Right Arm, Patient Position: Sitting, Cuff Size: Normal)   Pulse 62   Ht 5\' 6"  (1.676 m)   Wt 171 lb 6.4 oz (77.7 kg)   BMI 27.66 kg/m   EXAM: General appearance: alert and no distress Neck: no carotid bruit, no JVD and thyroid not enlarged, symmetric, no tenderness/mass/nodules Lungs: clear to auscultation bilaterally Heart: regular rate and rhythm Abdomen: soft, non-tender; bowel sounds normal; no masses,  no organomegaly Extremities: extremities normal, atraumatic, no cyanosis or edema Pulses: 2+ and symmetric Skin: Skin color, texture, turgor normal. No rashes or lesions Neurologic: Grossly normal Psych: Pleasant  EKG: Normal sinus rhythm 62, nonspecific ST changes-personally reviewed  ASSESSMENT: 1. History of PSVT 2. Recent stroke-likely secondary to atheroembolism 3. Carotid artery disease status post left CEA 4. Normal LV function on TEE with atrial septal aneurysm, but negative bubble study 5. Status post implanted loop recorder  PLAN: 1.   Regina James had history of stroke and carotid artery disease and has persistently elevated triglycerides.  We will need to reassess this however if they remain greater than 150, she is an ideal candidate to add Vascepa 2 g twice daily to her current regimen of pravastatin.  This is based on data from the reduce it trial which has not yet  been reviewed apparently by Medicare and I expect this hopefully to be in July although FDA approved indication for this therapy is available.  Hopefully she will be able to get the medication and at an affordable cost.  She has a loop recorder which is failed to show any additional events.  We will continue to gain information from that until the battery dies and then discuss whether she wants to have it explanted or leave it alone.  Plan follow-up with me sooner as necessary.  Pixie Casino, MD, [facility], [place] Director of the Advanced Lipid Disorders &  Cardiovascular Risk Reduction Clinic Diplomate of the American Board of Clinical Lipidology Attending Cardiologist  Direct Dial: [phone]  Fax: [phone]  Website:  [email or web address] 06/08/2018, 10:15 AM

## 2018-06-08 NOTE — Patient Instructions (Signed)
Medication Instructions:  Your Physician recommend you continue on your current medication as directed.    If you need a refill on your cardiac medications before your next appointment, please call your pharmacy.   Lab work: Your physician recommends that you return for lab work next week (fasting Lipid)  If you have labs (blood work) drawn today and your tests are completely normal, you will receive your results only by: Marland Kitchen MyChart Message (if you have MyChart) OR . A paper copy in the mail If you have any lab test that is abnormal or we need to change your treatment, we will call you to review the results.  Testing/Procedures: None  Follow-Up: At [facility], you and your health needs are our priority.  As part of our continuing mission to provide you with exceptional heart care, we have created designated Provider Care Teams.  These Care Teams include your primary Cardiologist (physician) and Advanced Practice Providers (APPs -  Physician Assistants and Nurse Practitioners) who all work together to provide you with the care you need, when you need it. You will need a follow up appointment in 1 years.  Please call our office 2 months in advance to schedule this appointment.  You may see Dr. Debara Pickett or one of the following Advanced Practice Providers on your designated Care Team: Almyra Deforest, [place] . Fabian Sharp, PA-C

## 2018-06-08 NOTE — Telephone Encounter (Signed)
Pt was told in office to call and verify her medication dosages.  Per her chart, she is taking pravastatin (PRAVACHOL) 20 MG tablet, and she verified that she is taking the med as directed.  Was given traMADol (ULTRAM) 50 MG tablet, and no longer takes this medication.  Everything else is correct. Verified from her AVS.

## 2018-06-08 NOTE — Telephone Encounter (Signed)
Noted.  Current medication list correct.  Thanks!

## 2018-07-04 ENCOUNTER — Other Ambulatory Visit: Payer: Self-pay

## 2018-07-04 ENCOUNTER — Ambulatory Visit (INDEPENDENT_AMBULATORY_CARE_PROVIDER_SITE_OTHER): Payer: Medicare Other | Admitting: *Deleted

## 2018-07-04 DIAGNOSIS — I639 Cerebral infarction, unspecified: Secondary | ICD-10-CM

## 2018-07-05 LAB — CUP PACEART REMOTE DEVICE CHECK
Date Time Interrogation Session: 20200401021119
Implantable Pulse Generator Implant Date: 20180731

## 2018-07-12 NOTE — Progress Notes (Signed)
[facility] Summary Report / Loop Recorder 

## 2018-07-26 ENCOUNTER — Other Ambulatory Visit: Payer: Self-pay | Admitting: Internal Medicine

## 2018-07-26 NOTE — Telephone Encounter (Signed)
Losartan 25 mg refilled. 

## 2018-08-07 ENCOUNTER — Other Ambulatory Visit: Payer: Self-pay

## 2018-08-07 ENCOUNTER — Ambulatory Visit (INDEPENDENT_AMBULATORY_CARE_PROVIDER_SITE_OTHER): Payer: Medicare Other | Admitting: *Deleted

## 2018-08-07 DIAGNOSIS — I639 Cerebral infarction, unspecified: Secondary | ICD-10-CM

## 2018-08-07 LAB — CUP PACEART REMOTE DEVICE CHECK
Date Time Interrogation Session: 20200504023930
Implantable Pulse Generator Implant Date: 20180731

## 2018-08-14 ENCOUNTER — Other Ambulatory Visit: Payer: Self-pay | Admitting: Internal Medicine

## 2018-08-14 NOTE — Progress Notes (Signed)
[facility] Summary Report / Loop Recorder 

## 2018-09-08 ENCOUNTER — Ambulatory Visit (INDEPENDENT_AMBULATORY_CARE_PROVIDER_SITE_OTHER): Payer: Medicare Other | Admitting: *Deleted

## 2018-09-08 DIAGNOSIS — I639 Cerebral infarction, unspecified: Secondary | ICD-10-CM | POA: Diagnosis not present

## 2018-09-10 LAB — CUP PACEART REMOTE DEVICE CHECK
Date Time Interrogation Session: 20200606033700
Implantable Pulse Generator Implant Date: 20180731

## 2018-09-15 DIAGNOSIS — C44729 Squamous cell carcinoma of skin of left lower limb, including hip: Secondary | ICD-10-CM | POA: Diagnosis not present

## 2018-09-15 DIAGNOSIS — Z85828 Personal history of other malignant neoplasm of skin: Secondary | ICD-10-CM | POA: Diagnosis not present

## 2018-09-15 DIAGNOSIS — L403 Pustulosis palmaris et plantaris: Secondary | ICD-10-CM | POA: Diagnosis not present

## 2018-09-15 NOTE — Progress Notes (Signed)
[facility] Summary Report / Loop Recorder 

## 2018-10-11 ENCOUNTER — Ambulatory Visit (INDEPENDENT_AMBULATORY_CARE_PROVIDER_SITE_OTHER): Payer: Medicare Other | Admitting: *Deleted

## 2018-10-11 DIAGNOSIS — I639 Cerebral infarction, unspecified: Secondary | ICD-10-CM | POA: Diagnosis not present

## 2018-10-12 LAB — CUP PACEART REMOTE DEVICE CHECK
Date Time Interrogation Session: 20200709101009
Implantable Pulse Generator Implant Date: 20180731

## 2018-10-23 NOTE — Progress Notes (Signed)
[facility] Summary Report / Loop Recorder 

## 2018-11-01 DIAGNOSIS — H353131 Nonexudative age-related macular degeneration, bilateral, early dry stage: Secondary | ICD-10-CM | POA: Diagnosis not present

## 2018-11-01 DIAGNOSIS — Z9889 Other specified postprocedural states: Secondary | ICD-10-CM | POA: Diagnosis not present

## 2018-11-01 DIAGNOSIS — Z9841 Cataract extraction status, right eye: Secondary | ICD-10-CM | POA: Diagnosis not present

## 2018-11-01 DIAGNOSIS — Z9842 Cataract extraction status, left eye: Secondary | ICD-10-CM | POA: Diagnosis not present

## 2018-11-01 DIAGNOSIS — Z961 Presence of intraocular lens: Secondary | ICD-10-CM | POA: Diagnosis not present

## 2018-11-13 ENCOUNTER — Ambulatory Visit (INDEPENDENT_AMBULATORY_CARE_PROVIDER_SITE_OTHER): Payer: Medicare Other | Admitting: *Deleted

## 2018-11-13 DIAGNOSIS — I63412 Cerebral infarction due to embolism of left middle cerebral artery: Secondary | ICD-10-CM | POA: Diagnosis not present

## 2018-11-14 LAB — CUP PACEART REMOTE DEVICE CHECK
Date Time Interrogation Session: 20200811092442
Implantable Pulse Generator Implant Date: 20180731

## 2018-11-21 NOTE — Progress Notes (Signed)
[facility] Summary Report / Loop Recorder 

## 2018-12-07 DIAGNOSIS — H40003 Preglaucoma, unspecified, bilateral: Secondary | ICD-10-CM | POA: Diagnosis not present

## 2018-12-07 DIAGNOSIS — Z961 Presence of intraocular lens: Secondary | ICD-10-CM | POA: Diagnosis not present

## 2018-12-07 DIAGNOSIS — H43813 Vitreous degeneration, bilateral: Secondary | ICD-10-CM | POA: Diagnosis not present

## 2018-12-07 DIAGNOSIS — H353131 Nonexudative age-related macular degeneration, bilateral, early dry stage: Secondary | ICD-10-CM | POA: Diagnosis not present

## 2018-12-18 ENCOUNTER — Ambulatory Visit (INDEPENDENT_AMBULATORY_CARE_PROVIDER_SITE_OTHER): Payer: Medicare Other | Admitting: *Deleted

## 2018-12-18 DIAGNOSIS — I639 Cerebral infarction, unspecified: Secondary | ICD-10-CM

## 2018-12-18 DIAGNOSIS — I471 Supraventricular tachycardia: Secondary | ICD-10-CM | POA: Diagnosis not present

## 2018-12-18 LAB — CUP PACEART REMOTE DEVICE CHECK
Date Time Interrogation Session: 20200913113924
Implantable Pulse Generator Implant Date: 20180731

## 2018-12-18 IMAGING — US US THYROID
1 series · 13 of 25 positions shown · non-contrast
Comparison: 05/14/2014

CLINICAL DATA: Prior ultrasound follow-up.

EXAM:
THYROID ULTRASOUND
TECHNIQUE: Ultrasound examination of the thyroid gland and adjacent soft
tissues was performed.

[Series 1: us thyroid · 0.04mm/px · 13 of 45 slices shown]
[im 1/45]
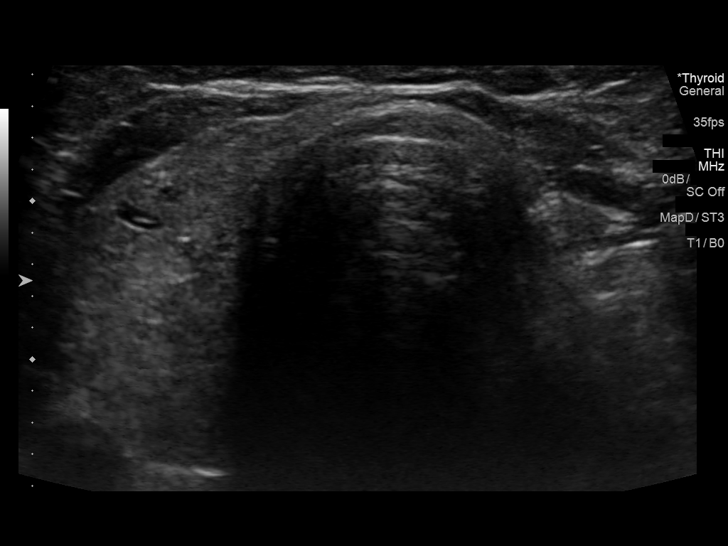
[im 4/45]
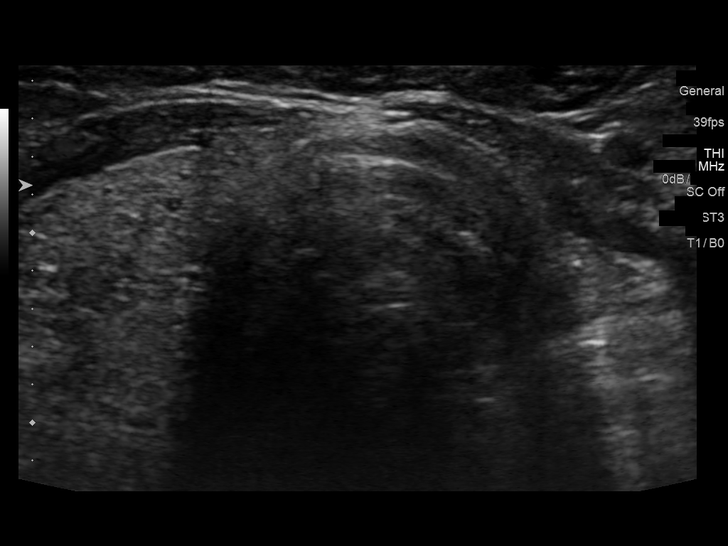
[im 8/45]
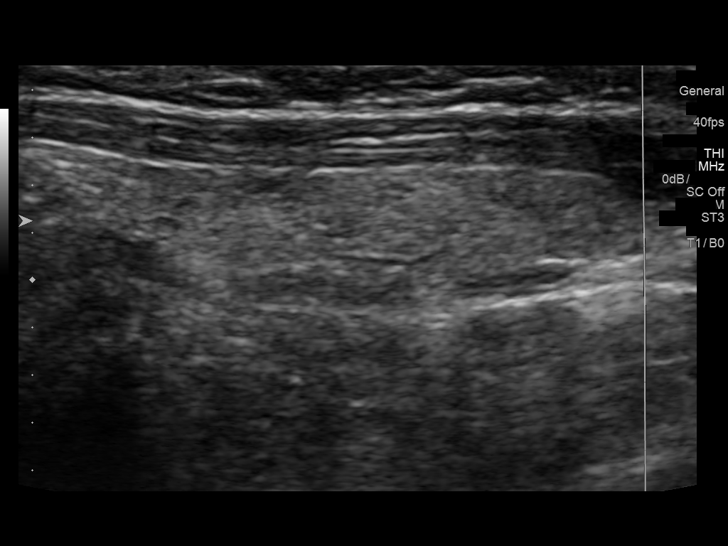
[im 12/45]
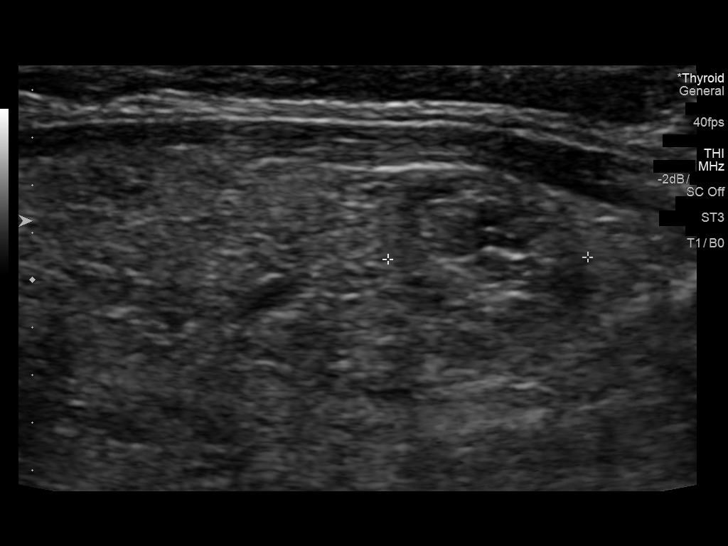
[im 15/45]
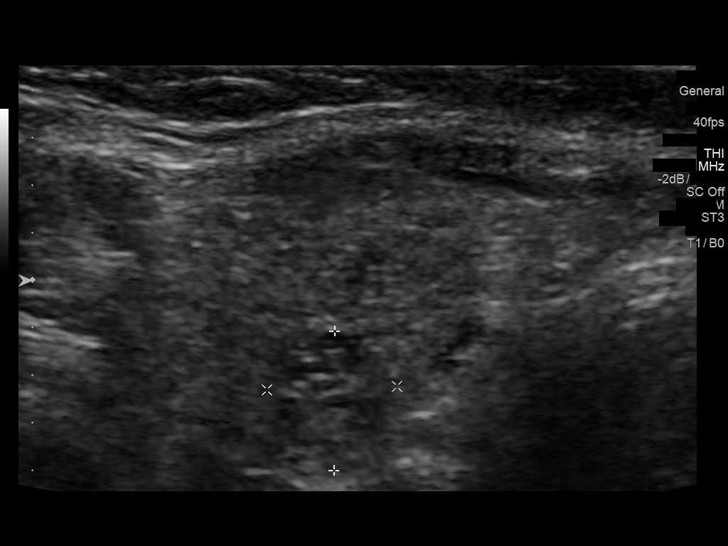
[im 19/45]
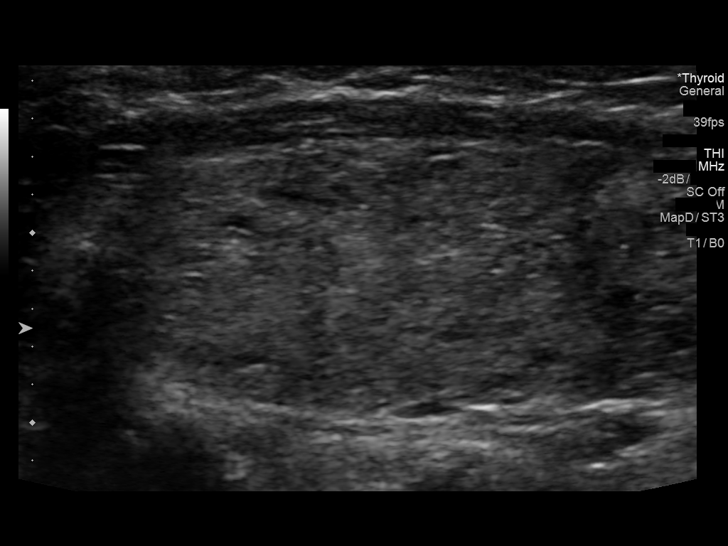
[im 23/45]
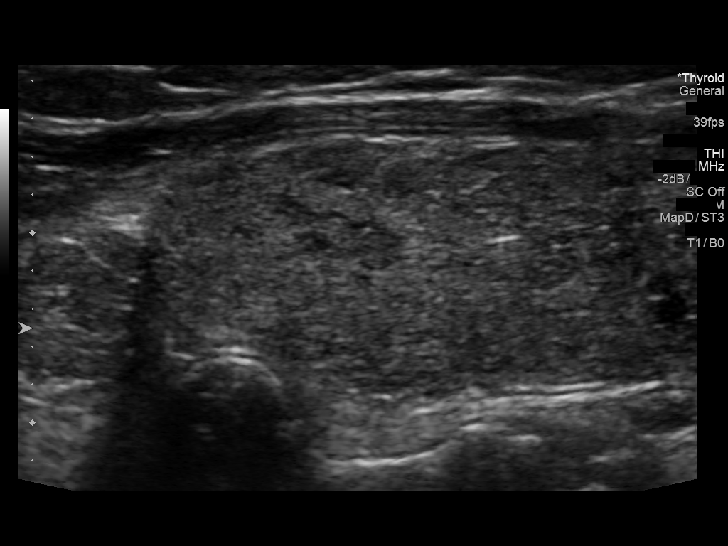
[im 26/45]
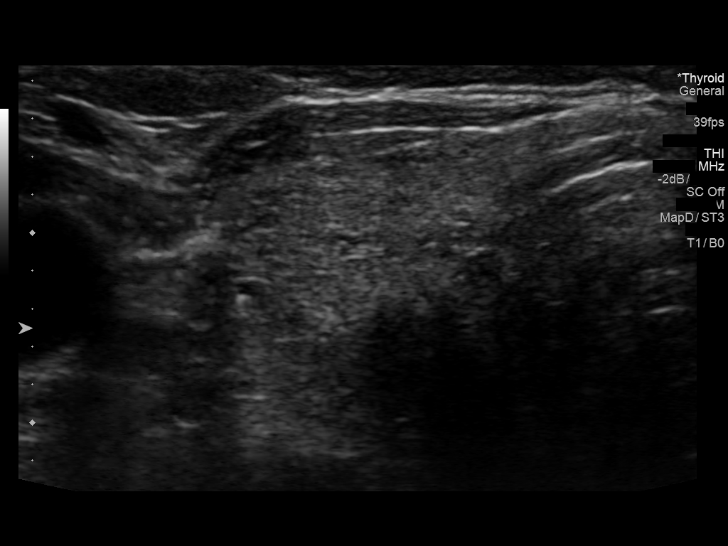
[im 30/45]
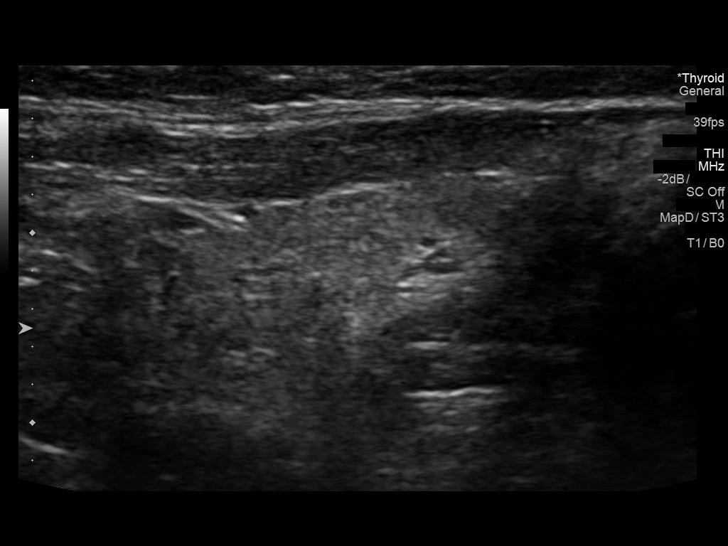
[im 34/45]
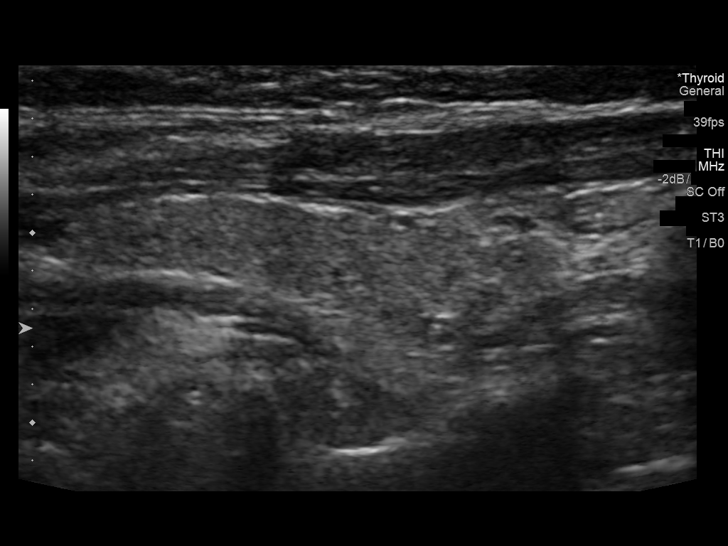
[im 37/45]
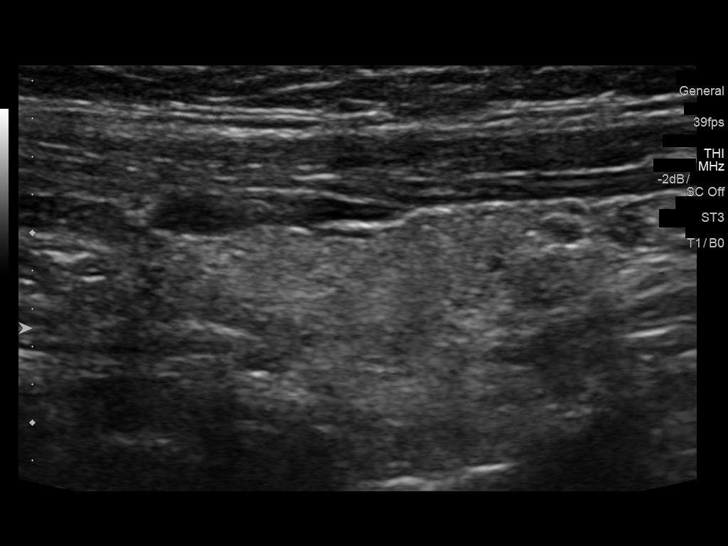
[im 41/45]
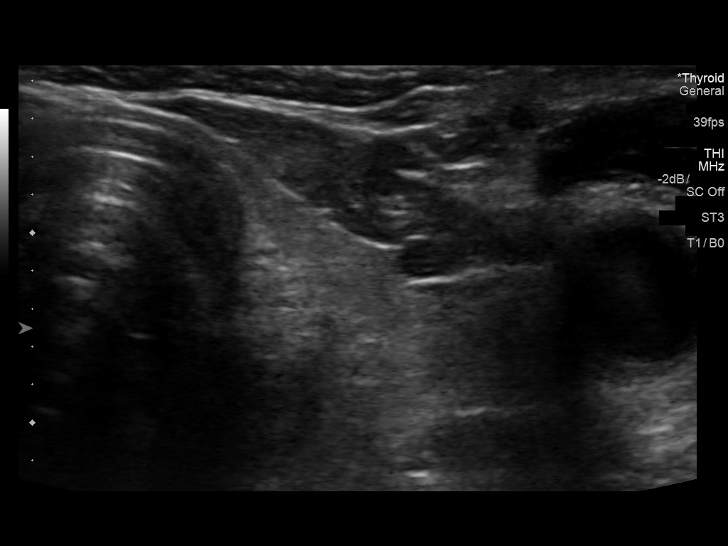
[im 45/45]
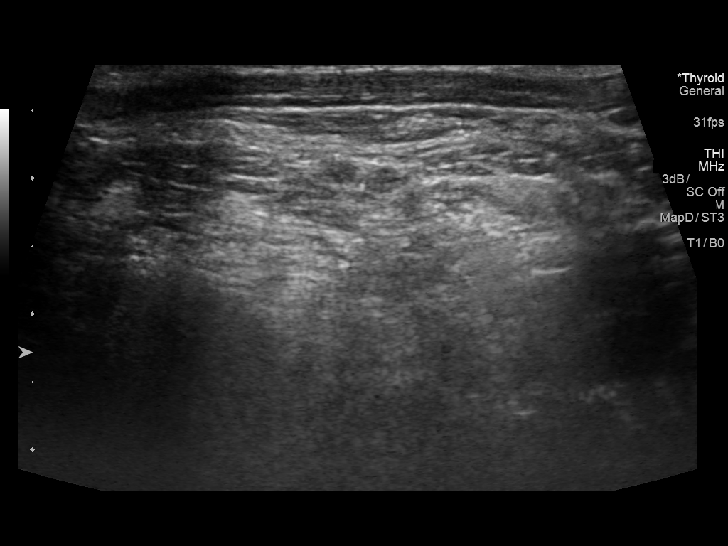

[13 of 25 positions shown; findings below may reference images not displayed]

FINDINGS: Parenchymal Echotexture: Moderately heterogenous

Isthmus: Normal in size measures 0.2 cm in diameter, unchanged

Right lobe: Slightly diminutive in size measuring 3.4 x 1.5 x
cm, unchanged, previously, 3.5 x 1.4 x 2.0 cm

Left lobe: Diminutive in size measuring 2.6 x 1.0 x 1.1 cm,
unchanged, previously, 2.3 x 0.6 x 1.2 cm.

_________________________________________________________

Estimated total number of nodules >/= 1 cm: 0

Number of spongiform nodules >/=  2 cm not described below (TR1): 0

Number of mixed cystic and solid nodules >/= 1.5 cm not described
below (TR2): 0

_________________________________________________________

Spongiform appearing nodule within the mid aspect the right lobe of
the thyroid is decreased in size in the interval, currently
measuring 0.7 x 0.5 x 0.7 cm, previously, 1.0 x 0.6 x 0.7 cm. This
nodule does not meet imaging criteria to recommend percutaneous
sampling or dedicated follow-up.

An additional spongiform appearing nodule with the mid aspect the
right lobe of the thyroid is unchanged, currently measuring 0.8 x
0.7 x 0.8 cm, previously, 0.9 x 0.7 x 0.8 cm. This nodule does not
meet imaging criteria to recommend percutaneous sampling or
dedicated follow-up.

Spongiform appearing nodule within the posterior inferior aspect of
the right lobe of the thyroid is unchanged to decreased in size the
interval, currently measuring 0.9 x 0.6 x 0.6 cm, previously, 1.1 x
0.7 x 1.2 cm. This nodule does not meet imaging criteria to
recommend percutaneous sampling or dedicated follow-up.

There is a punctate (approximately 0.3 cm) anechoic cyst within the
inferior aspect the left lobe of the thyroid which does not meet
imaging criteria to recommend percutaneous sampling or dedicated
follow-up.
IMPRESSION: 1. No new or enlarging thyroid nodules.
2. All discretely measured thyroid nodules are unchanged to
decreased in size compared to the [DATE] examination. None of the
discretely measured thyroid nodules meet imaging criteria to
recommend percutaneous sampling or dedicated follow-up.
The above is in keeping with the ACR TI-RADS recommendations - [HOSPITAL] 8790;[DATE].

## 2018-12-29 NOTE — Progress Notes (Signed)
[facility] Summary Report / Loop Recorder 

## 2019-01-17 IMAGING — CT CT HEAD W/O CM
3 series · 15 of 47 positions shown, 18 images · non-contrast
Comparison: March 23, 2007

CLINICAL DATA: Right upper extremity weakness

EXAM:
CT HEAD WITHOUT CONTRAST
TECHNIQUE: Contiguous axial images were obtained from the base of the skull
through the vertex without intravenous contrast.

[Series 3: head 5.0 h30s · axial · 0.44mm/px · z∈[-120,+25]mm · 9 of 35 slices shown, 12 images]
[im 3/35  brain]
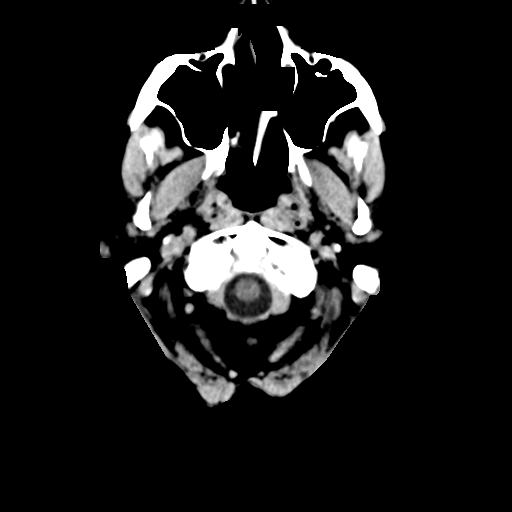
[im 3/35  bone]
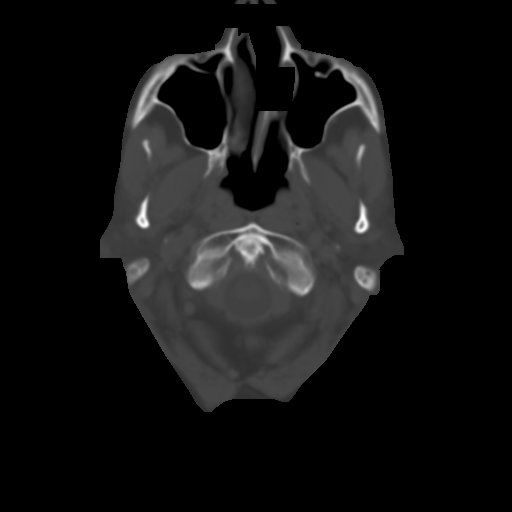
[im 6/35  brain]
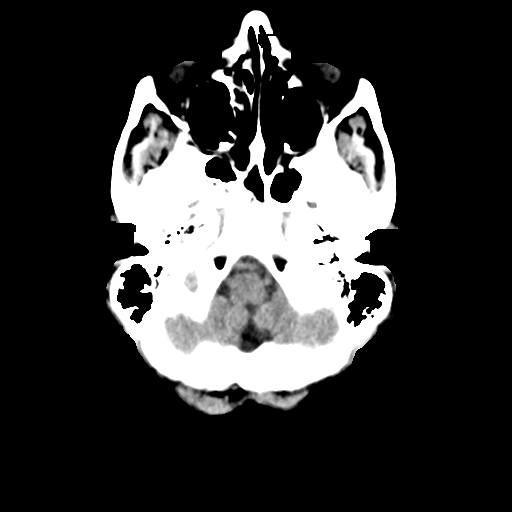
[im 10/35  brain]
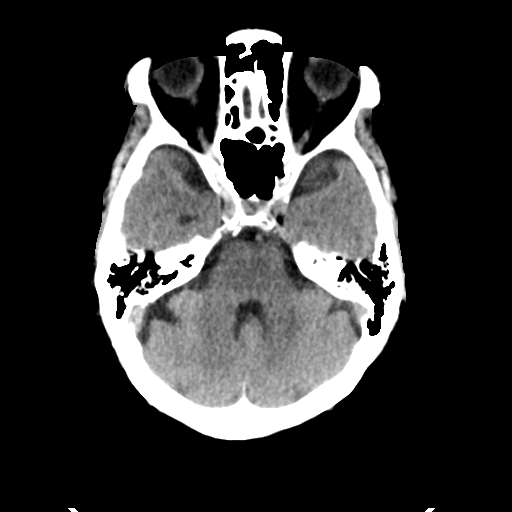
[im 13/35  brain]
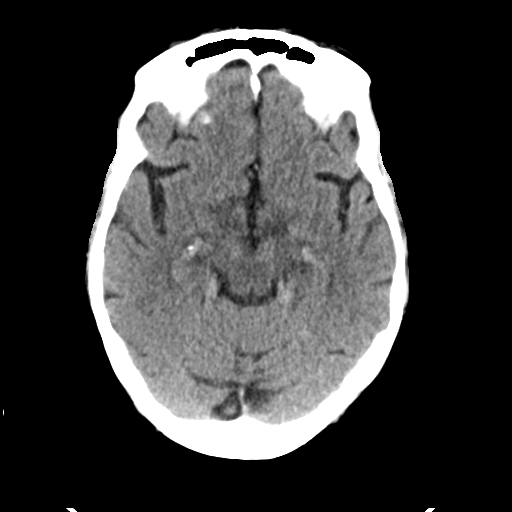
[im 18/35  brain]
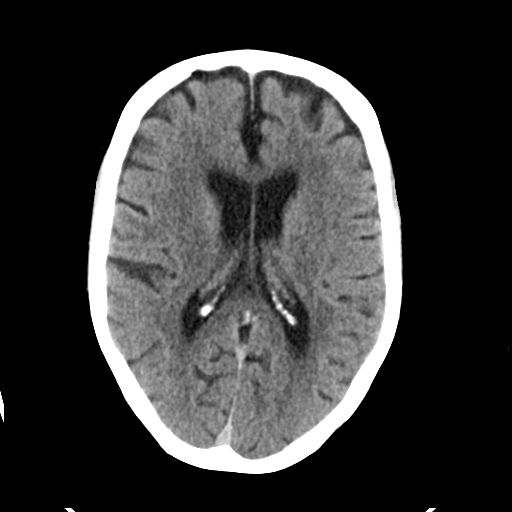
[im 18/35  bone]
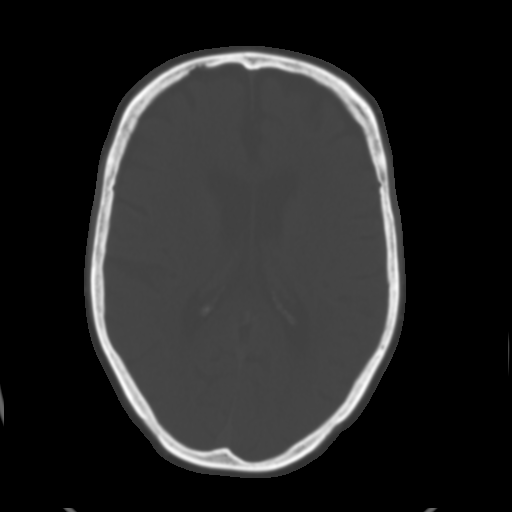
[im 22/35  brain]
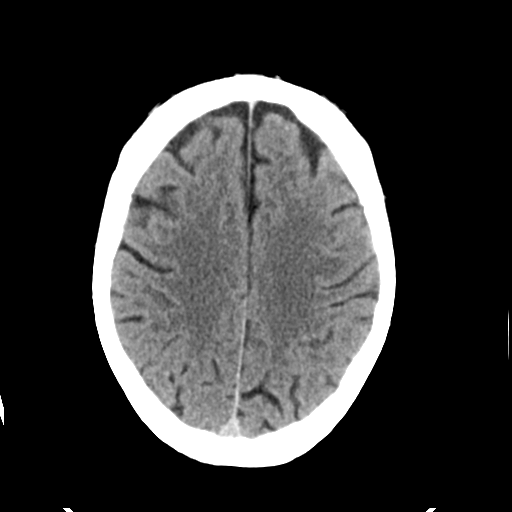
[im 25/35  brain]
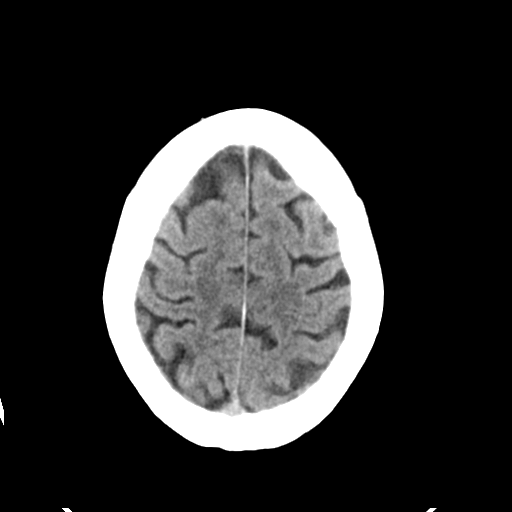
[im 29/35  brain]
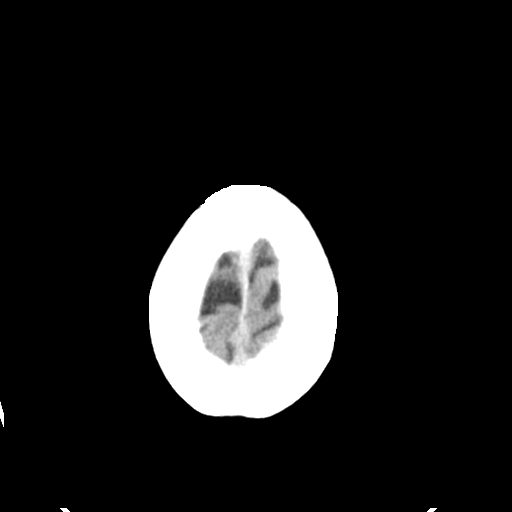
[im 32/35  brain]
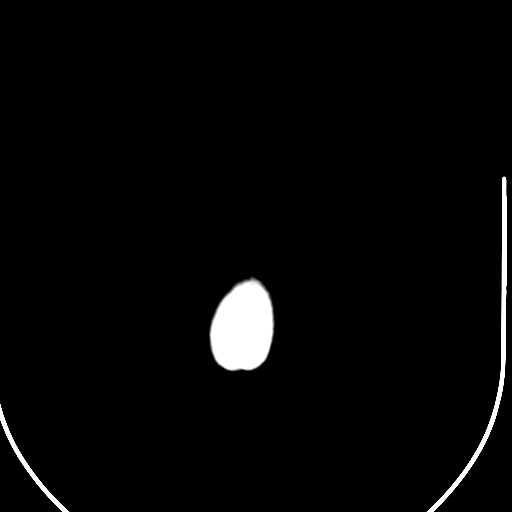
[im 32/35  bone]
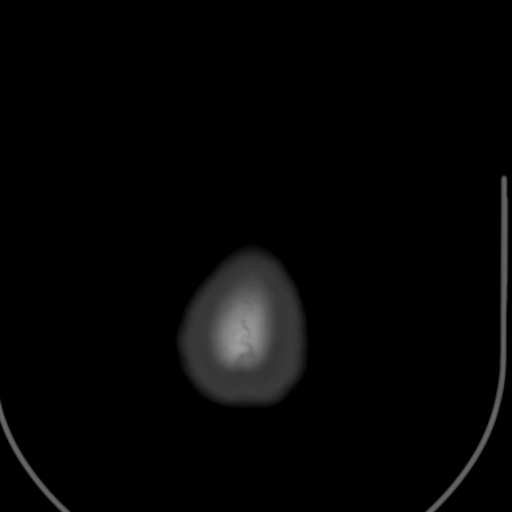

[Series 5: head 3.0 mpr cor · coronal · 0.34mm/px · 3 of 70 slices shown]
[im 24/70  brain]
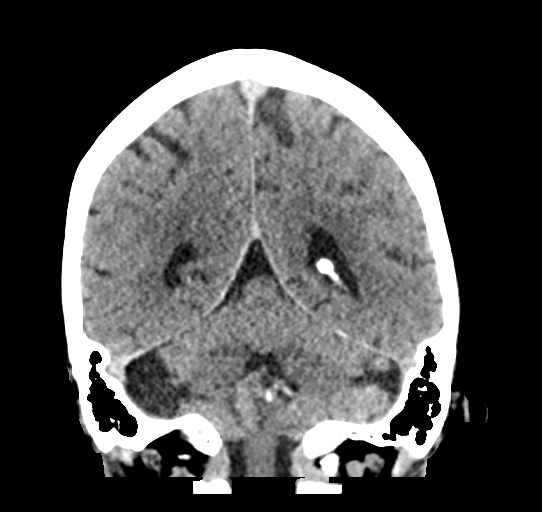
[im 31/70  brain]
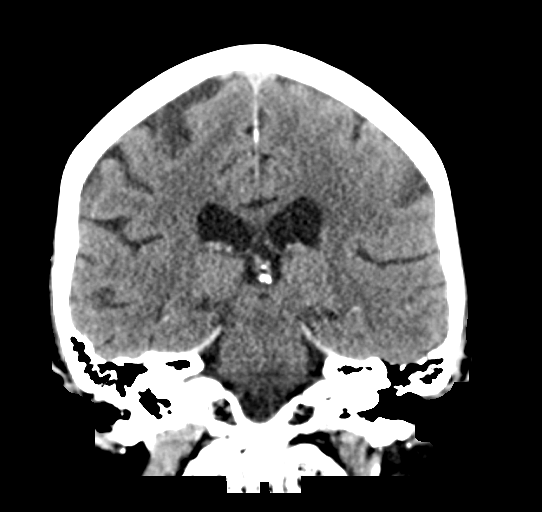
[im 39/70  brain]
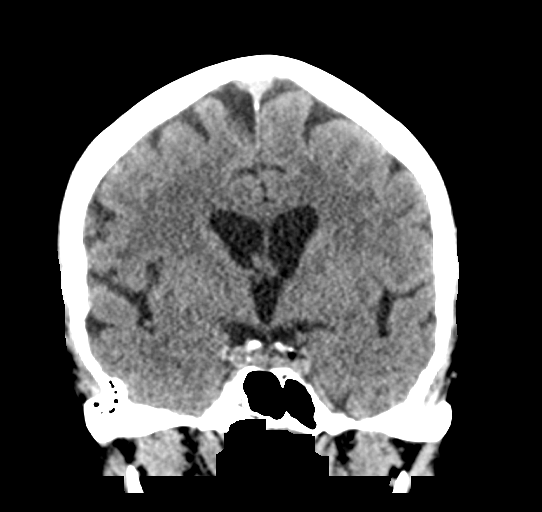

[Series 6: head 3.0 mpr sag · sagittal · 0.34mm/px · 3 of 58 slices shown]
[im 20/58  brain]
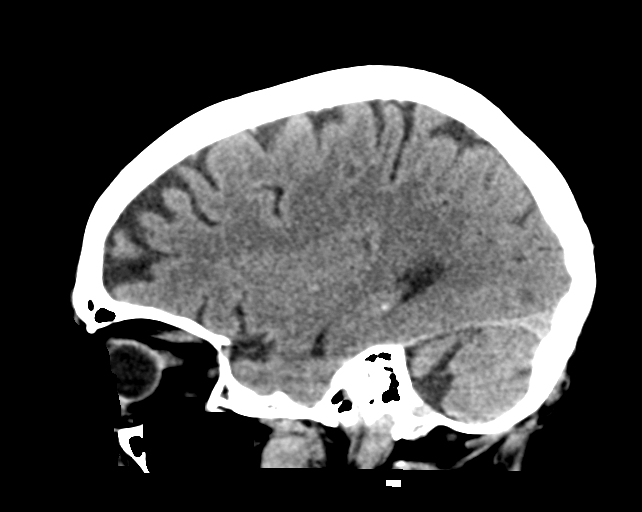
[im 29/58  brain]
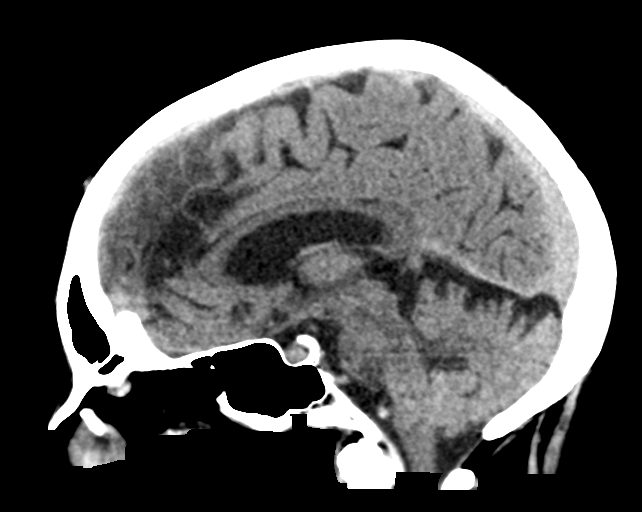
[im 39/58  brain]
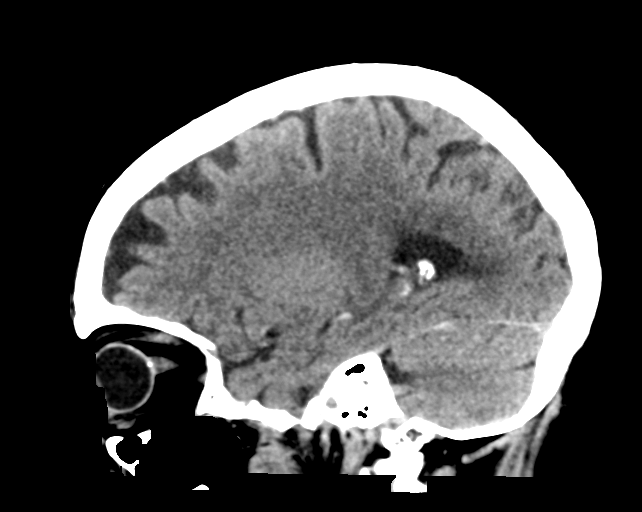

[15 of 47 positions shown; findings below may reference images not displayed]

FINDINGS: Brain: The ventricles are normal in size and configuration. There is
no intracranial mass, hemorrhage, extra-axial fluid collection, or
midline shift. There is slight small vessel disease in the centra
semiovale bilaterally. Elsewhere gray-white compartments appear
normal. No evident acute infarct.

Vascular: No hyperdense vessel. There is calcification in each
carotid siphon region.

Skull: The bony calvarium appears intact.

Sinuses/Orbits: There is mucosal thickening in multiple ethmoid air
cells bilaterally. Opacification is greatest in several anterior
right-sided ethmoid air cells. Visualized paranasal sinuses
otherwise are clear. There is leftward deviation of the nasal
septum. Orbits appear symmetric bilaterally.

Other: Mastoid air cells are clear.
IMPRESSION: Slight periventricular small vessel disease. No intracranial mass,
hemorrhage, or extra-axial fluid collection. No acute infarct
evident.

Foci of vascular calcification noted. Areas of ethmoid sinus disease
noted, more severe on the right than on the left. There is leftward
deviation of the nasal septum.

## 2019-01-18 DIAGNOSIS — M79641 Pain in right hand: Secondary | ICD-10-CM | POA: Diagnosis not present

## 2019-01-18 DIAGNOSIS — S61411A Laceration without foreign body of right hand, initial encounter: Secondary | ICD-10-CM | POA: Diagnosis not present

## 2019-01-19 ENCOUNTER — Ambulatory Visit (INDEPENDENT_AMBULATORY_CARE_PROVIDER_SITE_OTHER): Payer: Medicare Other | Admitting: *Deleted

## 2019-01-19 DIAGNOSIS — N183 Chronic kidney disease, stage 3 unspecified: Secondary | ICD-10-CM | POA: Diagnosis not present

## 2019-01-19 DIAGNOSIS — I2510 Atherosclerotic heart disease of native coronary artery without angina pectoris: Secondary | ICD-10-CM | POA: Diagnosis not present

## 2019-01-19 DIAGNOSIS — M859 Disorder of bone density and structure, unspecified: Secondary | ICD-10-CM | POA: Diagnosis not present

## 2019-01-19 DIAGNOSIS — Z6827 Body mass index (BMI) 27.0-27.9, adult: Secondary | ICD-10-CM | POA: Diagnosis not present

## 2019-01-19 DIAGNOSIS — I471 Supraventricular tachycardia: Secondary | ICD-10-CM | POA: Diagnosis not present

## 2019-01-19 DIAGNOSIS — I129 Hypertensive chronic kidney disease with stage 1 through stage 4 chronic kidney disease, or unspecified chronic kidney disease: Secondary | ICD-10-CM | POA: Diagnosis not present

## 2019-01-19 DIAGNOSIS — R5383 Other fatigue: Secondary | ICD-10-CM | POA: Diagnosis not present

## 2019-01-19 DIAGNOSIS — I700 Atherosclerosis of aorta: Secondary | ICD-10-CM | POA: Diagnosis not present

## 2019-01-19 DIAGNOSIS — Z23 Encounter for immunization: Secondary | ICD-10-CM | POA: Diagnosis not present

## 2019-01-19 DIAGNOSIS — Z0000 Encounter for general adult medical examination without abnormal findings: Secondary | ICD-10-CM | POA: Diagnosis not present

## 2019-01-19 DIAGNOSIS — I63412 Cerebral infarction due to embolism of left middle cerebral artery: Secondary | ICD-10-CM

## 2019-01-19 DIAGNOSIS — F17210 Nicotine dependence, cigarettes, uncomplicated: Secondary | ICD-10-CM | POA: Diagnosis not present

## 2019-01-21 LAB — CUP PACEART REMOTE DEVICE CHECK
Date Time Interrogation Session: 20201016113907
Implantable Pulse Generator Implant Date: 20180731

## 2019-01-24 NOTE — Progress Notes (Signed)
[facility] Summary Report / Loop Recorder 

## 2019-01-29 ENCOUNTER — Other Ambulatory Visit: Payer: Self-pay | Admitting: Family Medicine

## 2019-01-29 DIAGNOSIS — Z1231 Encounter for screening mammogram for malignant neoplasm of breast: Secondary | ICD-10-CM

## 2019-02-07 DIAGNOSIS — Z9889 Other specified postprocedural states: Secondary | ICD-10-CM | POA: Diagnosis not present

## 2019-02-07 DIAGNOSIS — Z961 Presence of intraocular lens: Secondary | ICD-10-CM | POA: Diagnosis not present

## 2019-02-07 DIAGNOSIS — H353131 Nonexudative age-related macular degeneration, bilateral, early dry stage: Secondary | ICD-10-CM | POA: Diagnosis not present

## 2019-02-21 ENCOUNTER — Ambulatory Visit (INDEPENDENT_AMBULATORY_CARE_PROVIDER_SITE_OTHER): Payer: Medicare Other | Admitting: *Deleted

## 2019-02-21 DIAGNOSIS — I471 Supraventricular tachycardia: Secondary | ICD-10-CM | POA: Diagnosis not present

## 2019-02-22 LAB — CUP PACEART REMOTE DEVICE CHECK
Date Time Interrogation Session: 20201118093530
Implantable Pulse Generator Implant Date: 20180731

## 2019-03-05 DIAGNOSIS — H40003 Preglaucoma, unspecified, bilateral: Secondary | ICD-10-CM | POA: Diagnosis not present

## 2019-03-19 ENCOUNTER — Other Ambulatory Visit: Payer: Self-pay

## 2019-03-19 ENCOUNTER — Ambulatory Visit
Admission: RE | Admit: 2019-03-19 | Discharge: 2019-03-19 | Disposition: A | Discharge status: Home / Self Care | Payer: Medicare Other | Source: Ambulatory Visit | Attending: Family Medicine | Admitting: Family Medicine

## 2019-03-19 DIAGNOSIS — Z1231 Encounter for screening mammogram for malignant neoplasm of breast: Secondary | ICD-10-CM | POA: Diagnosis not present

## 2019-03-22 NOTE — Progress Notes (Signed)
[facility] Summary Report / Loop Recorder 

## 2019-03-26 ENCOUNTER — Ambulatory Visit (INDEPENDENT_AMBULATORY_CARE_PROVIDER_SITE_OTHER): Payer: Medicare Other | Admitting: *Deleted

## 2019-03-26 DIAGNOSIS — R000 Tachycardia, unspecified: Secondary | ICD-10-CM | POA: Diagnosis not present

## 2019-03-26 LAB — CUP PACEART REMOTE DEVICE CHECK
Date Time Interrogation Session: 20201221093725
Implantable Pulse Generator Implant Date: 20180731

## 2019-04-09 ENCOUNTER — Other Ambulatory Visit: Payer: Self-pay | Admitting: Family Medicine

## 2019-04-10 ENCOUNTER — Other Ambulatory Visit: Payer: Self-pay | Admitting: Family Medicine

## 2019-04-10 DIAGNOSIS — I7789 Other specified disorders of arteries and arterioles: Secondary | ICD-10-CM

## 2019-04-13 DIAGNOSIS — H40003 Preglaucoma, unspecified, bilateral: Secondary | ICD-10-CM | POA: Diagnosis not present

## 2019-04-17 ENCOUNTER — Ambulatory Visit
Admission: RE | Admit: 2019-04-17 | Discharge: 2019-04-17 | Disposition: A | Discharge status: Home / Self Care | Payer: Medicare Other | Source: Ambulatory Visit | Attending: Family Medicine | Admitting: Family Medicine

## 2019-04-17 DIAGNOSIS — I7789 Other specified disorders of arteries and arterioles: Secondary | ICD-10-CM

## 2019-04-30 ENCOUNTER — Ambulatory Visit (INDEPENDENT_AMBULATORY_CARE_PROVIDER_SITE_OTHER): Payer: Medicare Other | Admitting: *Deleted

## 2019-04-30 DIAGNOSIS — R000 Tachycardia, unspecified: Secondary | ICD-10-CM | POA: Diagnosis not present

## 2019-04-30 LAB — CUP PACEART REMOTE DEVICE CHECK
Date Time Interrogation Session: 20210124231638
Implantable Pulse Generator Implant Date: 20180731

## 2019-05-01 ENCOUNTER — Ambulatory Visit: Payer: Medicare Other

## 2019-05-03 ENCOUNTER — Other Ambulatory Visit: Payer: Self-pay | Admitting: Internal Medicine

## 2019-05-03 ENCOUNTER — Other Ambulatory Visit: Payer: Self-pay | Admitting: Family Medicine

## 2019-05-03 DIAGNOSIS — I7789 Other specified disorders of arteries and arterioles: Secondary | ICD-10-CM

## 2019-05-08 DIAGNOSIS — H40003 Preglaucoma, unspecified, bilateral: Secondary | ICD-10-CM | POA: Diagnosis not present

## 2019-05-10 ENCOUNTER — Ambulatory Visit: Payer: Medicare Other | Attending: Internal Medicine

## 2019-05-10 DIAGNOSIS — Z23 Encounter for immunization: Secondary | ICD-10-CM | POA: Insufficient documentation

## 2019-05-10 NOTE — Progress Notes (Signed)
   Covid-19 Vaccination Clinic  Name:  Regina James    MRN: [id number] DOB: 02-Aug-1933  05/10/2019  [name] was observed post Covid-19 immunization for 15 minutes without incidence. She was provided with Vaccine Information Sheet and instruction to access the V-Safe system.   [name] was instructed to call 911 with any severe reactions post vaccine: Marland Kitchen Difficulty breathing  . Swelling of your face and throat  . A fast heartbeat  . A bad rash all over your body  . Dizziness and weakness    Immunizations Administered    Name Date Dose VIS Date Route   Pfizer COVID-19 Vaccine 05/10/2019 10:02 AM 0.3 mL 03/16/2019 Intramuscular   Manufacturer: [place]   Lot: [id number]   [place]: [id number]

## 2019-05-14 DIAGNOSIS — H401122 Primary open-angle glaucoma, left eye, moderate stage: Secondary | ICD-10-CM | POA: Diagnosis not present

## 2019-05-14 DIAGNOSIS — H40001 Preglaucoma, unspecified, right eye: Secondary | ICD-10-CM | POA: Diagnosis not present

## 2019-05-18 ENCOUNTER — Ambulatory Visit: Payer: Medicare Other

## 2019-05-22 DIAGNOSIS — N3000 Acute cystitis without hematuria: Secondary | ICD-10-CM | POA: Diagnosis not present

## 2019-05-25 ENCOUNTER — Other Ambulatory Visit: Payer: Self-pay

## 2019-05-25 ENCOUNTER — Ambulatory Visit
Admission: RE | Admit: 2019-05-25 | Discharge: 2019-05-25 | Disposition: A | Discharge status: Home / Self Care | Payer: Medicare Other | Source: Ambulatory Visit | Attending: Family Medicine | Admitting: Family Medicine

## 2019-05-25 DIAGNOSIS — I712 Thoracic aortic aneurysm, without rupture: Secondary | ICD-10-CM | POA: Diagnosis not present

## 2019-05-25 DIAGNOSIS — I7789 Other specified disorders of arteries and arterioles: Secondary | ICD-10-CM

## 2019-05-25 MED ORDER — GADOBENATE DIMEGLUMINE 529 MG/ML IV SOLN
15.0000 mL | Freq: Once | INTRAVENOUS | Status: AC | PRN
  Administered 2019-05-25: 15 mL via INTRAVENOUS

## 2019-05-31 DIAGNOSIS — H401122 Primary open-angle glaucoma, left eye, moderate stage: Secondary | ICD-10-CM | POA: Diagnosis not present

## 2019-05-31 DIAGNOSIS — H40001 Preglaucoma, unspecified, right eye: Secondary | ICD-10-CM | POA: Diagnosis not present

## 2019-06-04 ENCOUNTER — Ambulatory Visit (INDEPENDENT_AMBULATORY_CARE_PROVIDER_SITE_OTHER): Payer: Medicare Other | Admitting: *Deleted

## 2019-06-04 ENCOUNTER — Ambulatory Visit: Payer: Medicare Other | Attending: Internal Medicine

## 2019-06-04 DIAGNOSIS — Z23 Encounter for immunization: Secondary | ICD-10-CM

## 2019-06-04 DIAGNOSIS — R000 Tachycardia, unspecified: Secondary | ICD-10-CM | POA: Diagnosis not present

## 2019-06-04 LAB — CUP PACEART REMOTE DEVICE CHECK
Date Time Interrogation Session: 20210228234442
Implantable Pulse Generator Implant Date: 20180731

## 2019-06-04 NOTE — Progress Notes (Signed)
ILR Remote 

## 2019-06-04 NOTE — Progress Notes (Signed)
   Covid-19 Vaccination Clinic  Name:  Regina James    MRN: [id number] DOB: 06/18/1933  06/04/2019  Regina James was observed post Covid-19 immunization for 15 minutes without incidence. She was provided with Vaccine Information Sheet and instruction to access the V-Safe system.   Regina James was instructed to call 911 with any severe reactions post vaccine: Marland Kitchen Difficulty breathing  . Swelling of your face and throat  . A fast heartbeat  . A bad rash all over your body  . Dizziness and weakness    Immunizations Administered    Name Date Dose VIS Date Route   Pfizer COVID-19 Vaccine 06/04/2019  2:39 PM 0.3 mL 03/16/2019 Intramuscular   Manufacturer: [place]   Lot: [id number]   [place]: [id number]

## 2019-06-07 ENCOUNTER — Other Ambulatory Visit: Payer: Self-pay | Admitting: Internal Medicine

## 2019-06-11 ENCOUNTER — Encounter: Payer: Medicare Other | Admitting: Cardiothoracic Surgery

## 2019-06-21 ENCOUNTER — Telehealth: Payer: Self-pay

## 2019-06-21 NOTE — Telephone Encounter (Signed)
Call returned to Pt.  Pt states she got a phone call stating her monitor has not been recording since the beginning of the month.  Pt is very frustrated with trying to fix the transmission.  Pt states she is done with her loop monitor.  Pt would like a box sent to her home so she can return her monitor.  Advised if Pt would like her loop removed we can remove it.  Pt is not interested in having her loop removed at this time.  But she wants to return her monitor.    She has been monitored for almost 3 years with no atrial fibrillation noted.  Will send to Dr. Rayann Heman to see if he has any problems with her returning her monitor.

## 2019-06-21 NOTE — Telephone Encounter (Signed)
The pt called stating someone called stating the loop was not recording. I looked in [employer] and the monitor is up to date. I told her maybe the nurse wanted a manual transmission to get a better look at something. We tried to send a manual transmission but she received error codes 3230 and 3248. I told her I can call tech support for her but she declined. She states she do not want another monitor if the battery will be dead in December 18, 2022. I told her sometimes the loop recorder last longer than the 3 years. I told her she can come in the office each month to get it check. She states she is not doing that. I offered to give her the number to tech support to get help on her on time. She states she is not doing any of that. The monitor is a pain in the ass. I asked her what would she like at this moment. She states she do not want to be followed or monitored with the loop recorder. I told her I will let her Doctor know and go from there. The pt verbalized understanding and thanked me for the call.

## 2019-06-22 NOTE — Telephone Encounter (Signed)
Return kit ordered.

## 2019-06-25 ENCOUNTER — Encounter: Payer: Medicare Other | Admitting: Cardiothoracic Surgery

## 2019-08-08 DIAGNOSIS — H401122 Primary open-angle glaucoma, left eye, moderate stage: Secondary | ICD-10-CM | POA: Diagnosis not present

## 2019-08-08 DIAGNOSIS — Z961 Presence of intraocular lens: Secondary | ICD-10-CM | POA: Diagnosis not present

## 2019-08-08 DIAGNOSIS — H40001 Preglaucoma, unspecified, right eye: Secondary | ICD-10-CM | POA: Diagnosis not present

## 2019-08-17 ENCOUNTER — Other Ambulatory Visit: Payer: Self-pay | Admitting: Internal Medicine

## 2019-08-19 ENCOUNTER — Other Ambulatory Visit: Payer: Self-pay | Admitting: Internal Medicine

## 2019-09-05 ENCOUNTER — Other Ambulatory Visit: Payer: Self-pay | Admitting: Internal Medicine

## 2019-11-05 ENCOUNTER — Other Ambulatory Visit: Payer: Self-pay | Admitting: Internal Medicine

## 2019-11-08 DIAGNOSIS — H43813 Vitreous degeneration, bilateral: Secondary | ICD-10-CM | POA: Diagnosis not present

## 2019-11-08 DIAGNOSIS — H401122 Primary open-angle glaucoma, left eye, moderate stage: Secondary | ICD-10-CM | POA: Diagnosis not present

## 2019-11-08 DIAGNOSIS — H353131 Nonexudative age-related macular degeneration, bilateral, early dry stage: Secondary | ICD-10-CM | POA: Diagnosis not present

## 2019-11-08 DIAGNOSIS — Z961 Presence of intraocular lens: Secondary | ICD-10-CM | POA: Diagnosis not present

## 2019-11-19 ENCOUNTER — Other Ambulatory Visit: Payer: Self-pay | Admitting: Internal Medicine

## 2019-12-05 ENCOUNTER — Other Ambulatory Visit: Payer: Self-pay | Admitting: Internal Medicine

## 2019-12-07 ENCOUNTER — Encounter: Payer: Self-pay | Admitting: Internal Medicine

## 2019-12-07 ENCOUNTER — Other Ambulatory Visit: Payer: Self-pay

## 2019-12-07 ENCOUNTER — Ambulatory Visit (INDEPENDENT_AMBULATORY_CARE_PROVIDER_SITE_OTHER): Payer: Medicare Other | Admitting: Internal Medicine

## 2019-12-07 VITALS — BP 142/89 | HR 62 | Ht 66.0 in | Wt 169.8 lb

## 2019-12-07 DIAGNOSIS — E782 Mixed hyperlipidemia: Secondary | ICD-10-CM | POA: Diagnosis not present

## 2019-12-07 DIAGNOSIS — Z9889 Other specified postprocedural states: Secondary | ICD-10-CM

## 2019-12-07 DIAGNOSIS — I471 Supraventricular tachycardia: Secondary | ICD-10-CM | POA: Diagnosis not present

## 2019-12-07 NOTE — Patient Instructions (Addendum)
Medication Instructions:  No Changes In Medications at this time.  *If you need a refill on your cardiac medications before your next appointment, please call your pharmacy*  Lab Work: Please return to office for blood work to be drawn (Lipids), this will need to be done after fasting If you have labs (blood work) drawn today and your tests are completely normal, you will receive your results only by: Marland Kitchen MyChart Message (if you have MyChart) OR . A paper copy in the mail If you have any lab test that is abnormal or we need to change your treatment, we will call you to review the results.  Testing/Procedures: None Ordered At This Time.   Follow-Up: At [facility], you and your health needs are our priority.  As part of our continuing mission to provide you with exceptional heart care, we have created designated Provider Care Teams.  These Care Teams include your primary Cardiologist (physician) and Advanced Practice Providers (APPs -  Physician Assistants and Nurse Practitioners) who all work together to provide you with the care you need, when you need it.  Your next appointment:   1 year(s)  The format for your next appointment:   In Person  Provider:   K. Mali Hilty, MD

## 2019-12-07 NOTE — Progress Notes (Signed)
OFFICE NOTE  Chief Complaint:  Follow-up stroke  Primary Care Physician: Kathyrn Lass, MD  HPI:  Regina James is a 86 y.o. female with a past medial history significant for PSVT, followed by Dr. Mare Ferrari in the past.  Recently she presented with a stroke, thought to be atheroembolic from the carotid arteries.  She underwent left carotid endarterectomy, complicated by hematoma requiring repeat surgery and evacuation.  Since then she has done well.  She is on full dose aspirin, losartan, metoprolol and pravastatin.  LDL cholesterol in July was 89.  Her goal LDL is less than 70.  06/08/2018  Regina James returns today for follow-up of stroke and SVT.  She denies any recurrent SVT and has had no further stroke or TIA symptoms.  Her most recent lipid profile was abnormal in June 2019 showing total cholesterol 173, HDL 43, LDL at goal of 69 however triglycerides remain elevated at 303.  She is on moderate intensity pravastatin 20 mg daily.  We discussed the newer data regarding the REDUCE-IT trial suggesting that given her history of stroke and PAD with carotid artery disease, she is at higher risk of events particularly with persistent cardiovascular risk associated with elevated triglycerides.  12/09/2019  Regina James is seen today in follow-up.  Overall she is doing well.  EKG shows sinus rhythm with sinus arrhythmia.  She had labs in October 2020 with an LDL of 73.  Blood pressure was a little elevated today at 142/89.  She is on metoprolol and losartan.  She also takes low-dose pravastatin 20 mg.  I remain elevated at 243.  PMHx:  Past Medical History:  Diagnosis Date  . Cancer ([place])    basal cell carcinoma  . History of PSVT (paroxysmal supraventricular tachycardia)    Reported back as far as 2010.  Marland Kitchen Palpitations   . Stroke [facility])     Past Surgical History:  Procedure Laterality Date  . APPENDECTOMY  1993  . CHOLECYSTECTOMY  1996  . ENDARTERECTOMY Left 11/03/2016   Procedure:  Exploration of Carotid  ENDARTERECTOMY.;  Surgeon: Elam Dutch, MD;  Location: [place];  Service: Vascular;  Laterality: Left;  . ENDARTERECTOMY Left 11/03/2016   Procedure: ENDARTERECTOMY CAROTID;  Surgeon: Elam Dutch, MD;  Location: [place];  Service: Vascular;  Laterality: Left;  . LOOP RECORDER INSERTION N/A 11/02/2016   Procedure: Loop Recorder Insertion;  Surgeon: [name], MD;  Location: [place] CV LAB;  Service: Cardiovascular;  Laterality: N/A;  . ROTATOR CUFF REPAIR Right 1990  . TEE WITHOUT CARDIOVERSION N/A 11/02/2016   Procedure: TRANSESOPHAGEAL ECHOCARDIOGRAM (TEE);  Surgeon: Lelon Perla, MD;  Location: [facility] ENDOSCOPY;  Service: Cardiovascular;  Laterality: N/A;  . US ECHOCARDIOGRAPHY  01/09/2009   EF 55-60%  . WRIST FRACTURE SURGERY      FAMHx:  Family History  Problem Relation Age of Onset  . Cancer Father        prostate  . Diabetes Father   . Varicose Veins Mother   . Cardiomyopathy Mother   . Kidney cancer Brother   . Arrhythmia Brother     SOCHx:   reports that she quit smoking about 3 years ago. Her smoking use included cigarettes. She has a 11.25 pack-year smoking history. She has never used smokeless tobacco. She reports current alcohol use of about 7.0 standard drinks of alcohol per week. She reports that she does not use drugs.  ALLERGIES:  Allergies  Allergen Reactions  . Morphine And Related Swelling  .  Levofloxacin Anxiety and Other (See Comments)    Double vision    ROS: Pertinent items noted in HPI and remainder of comprehensive ROS otherwise negative.  HOME MEDS: Current Outpatient Medications on File Prior to Visit  Medication Sig Dispense Refill  . aspirin 325 MG tablet Take 1 tablet (325 mg total) by mouth daily.    . Doxylamine Succinate, Sleep, (SLEEP AID PO) Take by mouth.    . latanoprost (XALATAN) 0.005 % ophthalmic solution Place 1 drop into the left eye nightly.    Marland Kitchen losartan (COZAAR) 25 MG tablet TAKE 1 TABLET  EACH DAY. 90 tablet 0  . metoprolol succinate (TOPROL-XL) 100 MG 24 hr tablet TAKE 1/2 TABLET EVERY DAY. 45 tablet 1  . Multiple Vitamin (MULTI-VITAMINS) TABS Take by mouth.    . Multiple Vitamins-Minerals (PRESERVISION AREDS 2 PO) Take by mouth.    . pravastatin (PRAVACHOL) 20 MG tablet TAKE 1 TABLET AT 6PM. 30 tablet 1   No current facility-administered medications on file prior to visit.    LABS/IMAGING: No results found for this or any previous visit (from the past 48 hour(s)). No results found.  LIPID PANEL:    Component Value Date/Time   CHOL 147 11/01/2016 0341   TRIG 71 11/04/2016 0358   HDL 42 11/01/2016 0341   CHOLHDL 3.5 11/01/2016 0341   VLDL 16 11/01/2016 0341   LDLCALC 89 11/01/2016 0341     WEIGHTS: Wt Readings from Last 3 Encounters:  12/07/19 169 lb 12.8 oz (77 kg)  06/08/18 171 lb 6.4 oz (77.7 kg)  02/16/18 167 lb (75.8 kg)    VITALS: BP (!) 142/89   Pulse 62   Ht 5\' 6"  (1.676 m)   Wt 169 lb 12.8 oz (77 kg)   SpO2 95%   BMI 27.41 kg/m   EXAM: General appearance: alert and no distress Neck: no carotid bruit, no JVD and thyroid not enlarged, symmetric, no tenderness/mass/nodules Lungs: clear to auscultation bilaterally Heart: regular rate and rhythm Abdomen: soft, non-tender; bowel sounds normal; no masses,  no organomegaly Extremities: extremities normal, atraumatic, no cyanosis or edema Pulses: 2+ and symmetric Skin: Skin color, texture, turgor normal. No rashes or lesions Neurologic: Grossly normal Psych: Pleasant  EKG: Normal sinus rhythm with sinus arrhythmia at 62, voltage criteria for LVH-personally reviewed  ASSESSMENT: 1. History of PSVT 2. Recent stroke-likely secondary to atheroembolism 3. Carotid artery disease status post left CEA 4. Normal LV function on TEE with atrial septal aneurysm, but negative bubble study 5. Status post implanted loop recorder  PLAN: 1.   Regina James denies any recurrent SVT or stroke.  Triglycerides  remain elevated which is a risk factor for recurrent events.  She is status post loop recorder which continues to be checked as long as battery life is allowing it.  No evidence of A. fib at this point.  Carotid Dopplers in January 2021 showed very mild right internal carotid artery stenosis and stable patent left carotid endarterectomy.  We discussed the possible use of Vascepa given the 2019 REDUCE-IT trial data for her elevated triglycerides which are a persistent risk factor.  She was concerned about cost which may be an issue.  We will consider this in the future she could contact me if further interested.  No changes to her medicines today.  Follow-up with me annually or sooner as necessary.  Pixie Casino, MD, [facility], [place] Director of the Advanced Lipid Disorders &  Cardiovascular  Risk Reduction Clinic Diplomate of the American Board of Clinical Lipidology Attending Cardiologist  Direct Dial: [phone]  Fax: [phone]  Website:  [email or web address] 12/07/2019, 11:25 AM

## 2019-12-09 ENCOUNTER — Encounter: Payer: Self-pay | Admitting: Internal Medicine

## 2020-01-07 DIAGNOSIS — H401122 Primary open-angle glaucoma, left eye, moderate stage: Secondary | ICD-10-CM | POA: Diagnosis not present

## 2020-01-07 DIAGNOSIS — H40001 Preglaucoma, unspecified, right eye: Secondary | ICD-10-CM | POA: Diagnosis not present

## 2020-01-23 DIAGNOSIS — C44722 Squamous cell carcinoma of skin of right lower limb, including hip: Secondary | ICD-10-CM | POA: Diagnosis not present

## 2020-01-23 DIAGNOSIS — L821 Other seborrheic keratosis: Secondary | ICD-10-CM | POA: Diagnosis not present

## 2020-01-23 DIAGNOSIS — L820 Inflamed seborrheic keratosis: Secondary | ICD-10-CM | POA: Diagnosis not present

## 2020-01-23 DIAGNOSIS — Z85828 Personal history of other malignant neoplasm of skin: Secondary | ICD-10-CM | POA: Diagnosis not present

## 2020-02-04 DIAGNOSIS — Z23 Encounter for immunization: Secondary | ICD-10-CM | POA: Diagnosis not present

## 2020-02-04 DIAGNOSIS — H40001 Preglaucoma, unspecified, right eye: Secondary | ICD-10-CM | POA: Diagnosis not present

## 2020-02-04 DIAGNOSIS — E559 Vitamin D deficiency, unspecified: Secondary | ICD-10-CM | POA: Diagnosis not present

## 2020-02-04 DIAGNOSIS — M810 Age-related osteoporosis without current pathological fracture: Secondary | ICD-10-CM | POA: Diagnosis not present

## 2020-02-04 DIAGNOSIS — Z8673 Personal history of transient ischemic attack (TIA), and cerebral infarction without residual deficits: Secondary | ICD-10-CM | POA: Diagnosis not present

## 2020-02-04 DIAGNOSIS — I712 Thoracic aortic aneurysm, without rupture: Secondary | ICD-10-CM | POA: Diagnosis not present

## 2020-02-04 DIAGNOSIS — I700 Atherosclerosis of aorta: Secondary | ICD-10-CM | POA: Diagnosis not present

## 2020-02-04 DIAGNOSIS — Z0000 Encounter for general adult medical examination without abnormal findings: Secondary | ICD-10-CM | POA: Diagnosis not present

## 2020-02-04 DIAGNOSIS — F17210 Nicotine dependence, cigarettes, uncomplicated: Secondary | ICD-10-CM | POA: Diagnosis not present

## 2020-02-04 DIAGNOSIS — I129 Hypertensive chronic kidney disease with stage 1 through stage 4 chronic kidney disease, or unspecified chronic kidney disease: Secondary | ICD-10-CM | POA: Diagnosis not present

## 2020-02-04 DIAGNOSIS — Z6827 Body mass index (BMI) 27.0-27.9, adult: Secondary | ICD-10-CM | POA: Diagnosis not present

## 2020-02-04 DIAGNOSIS — E782 Mixed hyperlipidemia: Secondary | ICD-10-CM | POA: Diagnosis not present

## 2020-02-04 DIAGNOSIS — N183 Chronic kidney disease, stage 3 unspecified: Secondary | ICD-10-CM | POA: Diagnosis not present

## 2020-02-04 DIAGNOSIS — H401122 Primary open-angle glaucoma, left eye, moderate stage: Secondary | ICD-10-CM | POA: Diagnosis not present

## 2020-02-05 ENCOUNTER — Other Ambulatory Visit: Payer: Self-pay | Admitting: Internal Medicine

## 2020-03-10 ENCOUNTER — Other Ambulatory Visit: Payer: Self-pay

## 2020-03-10 ENCOUNTER — Institutional Professional Consult (permissible substitution) (INDEPENDENT_AMBULATORY_CARE_PROVIDER_SITE_OTHER): Payer: Medicare Other | Admitting: Cardiothoracic Surgery

## 2020-03-10 VITALS — BP 173/92 | HR 78 | Resp 20 | Ht 66.0 in | Wt 167.2 lb

## 2020-03-10 DIAGNOSIS — I712 Thoracic aortic aneurysm, without rupture, unspecified: Secondary | ICD-10-CM

## 2020-03-11 NOTE — Progress Notes (Signed)
[address]       [place] [address]             [phone]     CARDIOTHORACIC SURGERY CONSULTATION REPORT  Referring Provider is Kathyrn Lass, MD Primary Cardiologist is No primary care provider on file. PCP is Kathyrn Lass, MD  Chief Complaint  Patient presents with  . Thoracic Aortic Aneurysm    MRA chest 2/29  . Consult    Initial surgical consult    HPI:  86 year old lady who lives alone presents for evaluation of ascending aortic aneurysm.  She was in her usual state of health until approximately 3 years ago when she is experienced a stroke.  Her work-up demonstrated carotid artery disease but it also demonstrated an ascending aortic aneurysm.  She has been somewhat lost to follow-up until this past February when she underwent an MRA for surveillance.  In the meantime her husband has died and she has been dealing with that.  She presents now for assessment of the aneurysm.  She denies any personal history of aneurysm or strong family history of aneurysm.  She is treated for hypertension with 2 different medications.  She is a former smoker.  Past Medical History:  Diagnosis Date  . Cancer ([place])    basal cell carcinoma  . History of PSVT (paroxysmal supraventricular tachycardia)    Reported back as far as 2010.  Marland Kitchen Palpitations   . Stroke [facility])     Past Surgical History:  Procedure Laterality Date  . APPENDECTOMY  1993  . CHOLECYSTECTOMY  1996  . ENDARTERECTOMY Left 11/03/2016   Procedure: Exploration of Carotid  ENDARTERECTOMY.;  Surgeon: Elam Dutch, MD;  Location: [place];  Service: Vascular;  Laterality: Left;  . ENDARTERECTOMY Left 11/03/2016   Procedure: ENDARTERECTOMY CAROTID;  Surgeon: Elam Dutch, MD;  Location: [place];  Service: Vascular;  Laterality: Left;  . LOOP RECORDER INSERTION N/A 11/02/2016   Procedure: Loop Recorder Insertion;  Surgeon: [name], MD;  Location: [place] CV LAB;  Service: Cardiovascular;   Laterality: N/A;  . ROTATOR CUFF REPAIR Right 1990  . TEE WITHOUT CARDIOVERSION N/A 11/02/2016   Procedure: TRANSESOPHAGEAL ECHOCARDIOGRAM (TEE);  Surgeon: Lelon Perla, MD;  Location: [facility] ENDOSCOPY;  Service: Cardiovascular;  Laterality: N/A;  . US ECHOCARDIOGRAPHY  01/09/2009   EF 55-60%  . WRIST FRACTURE SURGERY      Family History  Problem Relation Age of Onset  . Cancer Father        prostate  . Diabetes Father   . Varicose Veins Mother   . Cardiomyopathy Mother   . Kidney cancer Brother   . Arrhythmia Brother     Social History   Socioeconomic History  . Marital status: Married    Spouse name: Wouter  . Number of children: 3  . Years of education: 25  . Highest education level: Not on file  Occupational History    Comment: retired, travel agent  Tobacco Use  . Smoking status: Former Smoker    Packs/day: 0.25    Years: 45.00    Pack years: 11.25    Types: Cigarettes    Quit date: 06/10/2016    Years since quitting: 3.7  . Smokeless tobacco: Never Used  Vaping Use  . Vaping Use: Never used  Substance and Sexual Activity  . Alcohol use: Yes    Alcohol/week: 7.0 standard drinks    Types: 7 Glasses of wine per week  Comment: 1 glass wine daily  . Drug use: No  . Sexual activity: Not on file  Other Topics Concern  . Not on file  Social History Narrative   Lives at home w/husband   Caffeine- coffee, 2 cups daily   Social Determinants of Health   Financial Resource Strain:   . Difficulty of Paying Living Expenses: Not on file  Food Insecurity:   . Worried About Charity fundraiser in the Last Year: Not on file  . Ran Out of Food in the Last Year: Not on file  Transportation Needs:   . Lack of Transportation (Medical): Not on file  . Lack of Transportation (Non-Medical): Not on file  Physical Activity:   . Days of Exercise per Week: Not on file  . Minutes of Exercise per Session: Not on file  Stress:   . Feeling of Stress : Not on file  Social  Connections:   . Frequency of Communication with Friends and Family: Not on file  . Frequency of Social Gatherings with Friends and Family: Not on file  . Attends Religious Services: Not on file  . Active Member of Clubs or Organizations: Not on file  . Attends Archivist Meetings: Not on file  . Marital Status: Not on file  Intimate Partner Violence:   . Fear of Current or Ex-Partner: Not on file  . Emotionally Abused: Not on file  . Physically Abused: Not on file  . Sexually Abused: Not on file    Current Outpatient Medications  Medication Sig Dispense Refill  . aspirin 325 MG tablet Take 1 tablet (325 mg total) by mouth daily.    . Doxylamine Succinate, Sleep, (SLEEP AID PO) Take by mouth.    . latanoprost (XALATAN) 0.005 % ophthalmic solution Place 1 drop into the left eye nightly.    Marland Kitchen losartan (COZAAR) 25 MG tablet TAKE 1 TABLET EACH DAY. 90 tablet 0  . metoprolol succinate (TOPROL-XL) 100 MG 24 hr tablet TAKE 1/2 TABLET EVERY DAY. 45 tablet 1  . Multiple Vitamin (MULTI-VITAMINS) TABS Take by mouth.    . Multiple Vitamins-Minerals (PRESERVISION AREDS 2 PO) Take by mouth.    . pravastatin (PRAVACHOL) 20 MG tablet TAKE 1 TABLET AT 6PM. 30 tablet 1   No current facility-administered medications for this visit.    Allergies  Allergen Reactions  . Morphine And Related Swelling  . Levofloxacin Anxiety and Other (See Comments)    Double vision      Review of Systems:   General:  No weight change or change in energy level  Cardiac:  No chest pain or shortness of breath with exertion  Respiratory:  No bronchial symptoms  GI:   No abdominal pain or constipation or bleeding  GU:   No kidney or bladder disease  Vascular:  History of left carotid endarterectomy  Neuro:   History of stroke with no residual  Musculoskeletal: No arthritis  Skin:   Negative  Psych:   Has experienced unusual recent stress in the last year due to the death of her  husband  Eyes:   Negative  ENT:   Negative  Hematologic:  Negative  Endocrine:  No diabetes no thyroid disease     Physical Exam:   BP (!) 173/92 (BP Location: Left Arm, Patient Position: Sitting)   Pulse 78   Resp 20   Ht 5\' 6"  (1.676 m)   Wt 75.8 kg   SpO2 97% Comment: RA with mask on  BMI  26.99 kg/m   General:    well-appearing  HEENT:  Unremarkable  Neck:   no JVD, no bruits, no adenopathy   Chest:   clear to auscultation, symmetrical breath sounds, no wheezes, no rhonchi   CV:   RRR, no detectable murmur   Abdomen:  soft, non-tender, no masses   Extremities:  warm, well-perfused, pulses intact throughout, no LE edema  Rectal/GU  Deferred  Neuro:   Grossly non-focal and symmetrical throughout  Skin:   Clean and dry, no rashes, no breakdown   Diagnostic Tests:  I personally reviewed her available imaging studies the last of which was an MRA of the chest from February 2021 and agree with the interpretation   Impression:  86 year old lady with asymptomatic ascending aortic aneurysm.  This has not been evaluated in nearly a year so close follow-up is recommended   Plan:  Chest CT in 2 months with follow-up appointment to review the results shortly thereafter Report any severe chest or back pain to local emergency department   I spent in excess of 30 minutes during the conduct of this office consultation and >50% of this time involved direct face-to-face encounter with the patient for counseling and/or coordination of their care.          Level 3 Office Consult = 40 minutes         Level 4 Office Consult = 60 minutes         Level 5 Office Consult = 80 minutes  B.  Murvin Natal, MD 03/11/2020 11:05 AM

## 2020-04-22 ENCOUNTER — Other Ambulatory Visit: Payer: Self-pay | Admitting: *Deleted

## 2020-04-22 DIAGNOSIS — I712 Thoracic aortic aneurysm, without rupture, unspecified: Secondary | ICD-10-CM

## 2020-04-25 ENCOUNTER — Encounter (HOSPITAL_COMMUNITY): Payer: Self-pay | Admitting: Emergency Medicine

## 2020-04-25 ENCOUNTER — Emergency Department (HOSPITAL_COMMUNITY)
Admission: EM | Admit: 2020-04-25 | Discharge: 2020-04-26 | Disposition: A | Discharge status: Home / Self Care | Payer: Medicare Other | Attending: Emergency Medicine | Admitting: Emergency Medicine

## 2020-04-25 DIAGNOSIS — Z79899 Other long term (current) drug therapy: Secondary | ICD-10-CM | POA: Insufficient documentation

## 2020-04-25 DIAGNOSIS — Z85828 Personal history of other malignant neoplasm of skin: Secondary | ICD-10-CM | POA: Diagnosis not present

## 2020-04-25 DIAGNOSIS — I10 Essential (primary) hypertension: Secondary | ICD-10-CM | POA: Insufficient documentation

## 2020-04-25 DIAGNOSIS — T783XXA Angioneurotic edema, initial encounter: Secondary | ICD-10-CM

## 2020-04-25 DIAGNOSIS — Z87891 Personal history of nicotine dependence: Secondary | ICD-10-CM | POA: Diagnosis not present

## 2020-04-25 DIAGNOSIS — Z7982 Long term (current) use of aspirin: Secondary | ICD-10-CM | POA: Insufficient documentation

## 2020-04-25 DIAGNOSIS — K148 Other diseases of tongue: Secondary | ICD-10-CM | POA: Diagnosis present

## 2020-04-25 LAB — CBC WITH DIFFERENTIAL/PLATELET
Abs Immature Granulocytes: 0.02 10*3/uL (ref 0.00–0.07)
Basophils Absolute: 0.1 10*3/uL (ref 0.0–0.1)
Basophils Relative: 1 %
Eosinophils Absolute: 0.3 10*3/uL (ref 0.0–0.5)
Eosinophils Relative: 3 %
HCT: 49.3 % — ABNORMAL HIGH (ref 36.0–46.0)
Hemoglobin: 16.4 g/dL — ABNORMAL HIGH (ref 12.0–15.0)
Immature Granulocytes: 0 %
Lymphocytes Relative: 31 %
Lymphs Abs: 3.1 10*3/uL (ref 0.7–4.0)
MCH: 32.2 pg (ref 26.0–34.0)
MCHC: 33.3 g/dL (ref 30.0–36.0)
MCV: 96.7 fL (ref 80.0–100.0)
Monocytes Absolute: 0.8 10*3/uL (ref 0.1–1.0)
Monocytes Relative: 8 %
Neutro Abs: 5.8 10*3/uL (ref 1.7–7.7)
Neutrophils Relative %: 57 %
Platelets: 386 10*3/uL (ref 150–400)
RBC: 5.1 MIL/uL (ref 3.87–5.11)
RDW: 14.9 % (ref 11.5–15.5)
WBC: 10.2 10*3/uL (ref 4.0–10.5)
nRBC: 0 % (ref 0.0–0.2)

## 2020-04-25 MED ORDER — FAMOTIDINE IN NACL 20-0.9 MG/50ML-% IV SOLN
20.0000 mg | Freq: Once | INTRAVENOUS | Status: AC
  Administered 2020-04-25: 20 mg via INTRAVENOUS
  Filled 2020-04-25: qty 50

## 2020-04-25 MED ORDER — DIPHENHYDRAMINE HCL 50 MG/ML IJ SOLN
50.0000 mg | Freq: Once | INTRAMUSCULAR | Status: AC
  Administered 2020-04-25: 50 mg via INTRAVENOUS
  Filled 2020-04-25: qty 1

## 2020-04-25 MED ORDER — METHYLPREDNISOLONE SODIUM SUCC 125 MG IJ SOLR
125.0000 mg | Freq: Once | INTRAMUSCULAR | Status: AC
  Administered 2020-04-25: 125 mg via INTRAVENOUS
  Filled 2020-04-25: qty 2

## 2020-04-25 NOTE — ED Triage Notes (Signed)
Pt presents with L sided tongue swelling. She has been on lisinopril for years. Also ate some shrimp earlier today. No known allergies.

## 2020-04-25 NOTE — ED Provider Notes (Signed)
[name] DEPT Provider Note   CSN: [id number] Arrival date & time: 04/25/20  2257   History Chief Complaint  Patient presents with  . Oral Swelling    Regina James is a 87 y.o. female.  The history is provided by the patient.  She has history of stroke, hypertension, thoracic aortic aneurysm and comes in because of tongue swelling which started about an hour before she got to the hospital.  She she noticed that the swelling is on the left side of her tongue and feels like she is having some difficulty swallowing. She is not having any difficulty breathing.  She does take losartan for her blood pressure.  She had eaten some shrimp tonight and thought that she might be reaction to shrimp.  She did take diphenhydramine 50mg  prior to coming to the hospital.  She also relates that she had a similar episode sometime in the past which she thought was related to a drink that she had.  Past Medical History:  Diagnosis Date  . Cancer ([place])    basal cell carcinoma  . History of PSVT (paroxysmal supraventricular tachycardia)    Reported back as far as 2010.  Marland Kitchen Palpitations   . Stroke [facility])     Patient Active Problem List   Diagnosis Date Noted  . Carotid stenosis, left 01/03/2017  . History of CEA (carotid endarterectomy) 01/03/2017  . Thoracic aortic aneurysm without rupture ([place]) 01/03/2017  . Subclavian artery stenosis, left ([place]) 01/03/2017  . Right sided weakness   . History of basal cell carcinoma   . PSVT (paroxysmal supraventricular tachycardia) ([place])   . History of CVA with residual deficit   . Medically noncompliant   . Acute blood loss anemia   . Dysphagia, post-stroke   . Leukocytosis   . Tachypnea   . Mixed dyslipidemia 11/01/2016  . Essential hypertension 11/01/2016  . Tachyarrhythmia 11/01/2016  . Internal carotid artery stenosis, left 11/01/2016  . Aortic arch aneurysm ([place]) 11/01/2016  . Cerebrovascular accident (CVA) due to  embolism of left middle cerebral artery ([place]) 10/31/2016  . Right arm weakness 06/27/2016  . Right arm numbness 06/27/2016  . Deviated septum 06/27/2016  . Disorder of ethmoidal sinus 06/27/2016  . Acute ischemic stroke ([place])   . Dysarthria   . Paroxysmal SVT (supraventricular tachycardia) ([place]) 05/21/2016  . Angiopathy, peripheral ([place]) 01/07/2015  . Cloudy posterior capsule 06/01/2013  . Pseudoaphakia 05/02/2013  . Malaise and fatigue 02/16/2013  . Cataract, nuclear 01/28/2012  . Palpitations 02/17/2011  . Claudication ([place]) 02/17/2011  . Tobacco abuse 02/17/2011    Past Surgical History:  Procedure Laterality Date  . APPENDECTOMY  1993  . CHOLECYSTECTOMY  1996  . ENDARTERECTOMY Left 11/03/2016   Procedure: Exploration of Carotid  ENDARTERECTOMY.;  Surgeon: Elam Dutch, MD;  Location: [place];  Service: Vascular;  Laterality: Left;  . ENDARTERECTOMY Left 11/03/2016   Procedure: ENDARTERECTOMY CAROTID;  Surgeon: Elam Dutch, MD;  Location: [place];  Service: Vascular;  Laterality: Left;  . LOOP RECORDER INSERTION N/A 11/02/2016   Procedure: Loop Recorder Insertion;  Surgeon: [name], MD;  Location: [place] CV LAB;  Service: Cardiovascular;  Laterality: N/A;  . ROTATOR CUFF REPAIR Right 1990  . TEE WITHOUT CARDIOVERSION N/A 11/02/2016   Procedure: TRANSESOPHAGEAL ECHOCARDIOGRAM (TEE);  Surgeon: Lelon Perla, MD;  Location: [facility] ENDOSCOPY;  Service: Cardiovascular;  Laterality: N/A;  . US ECHOCARDIOGRAPHY  01/09/2009   EF 55-60%  . WRIST FRACTURE SURGERY  OB History   No obstetric history on file.     Family History  Problem Relation Age of Onset  . Cancer Father        prostate  . Diabetes Father   . Varicose Veins Mother   . Cardiomyopathy Mother   . Kidney cancer Brother   . Arrhythmia Brother     Social History   Tobacco Use  . Smoking status: Former Smoker    Packs/day: 0.25    Years: 45.00    Pack years: 11.25    Types: Cigarettes     Quit date: 06/10/2016    Years since quitting: 3.8  . Smokeless tobacco: Never Used  Vaping Use  . Vaping Use: Never used  Substance Use Topics  . Alcohol use: Yes    Alcohol/week: 7.0 standard drinks    Types: 7 Glasses of wine per week    Comment: 1 glass wine daily  . Drug use: No    Home Medications Prior to Admission medications   Medication Sig Start Date End Date Taking? Authorizing Provider  aspirin 325 MG tablet Take 1 tablet (325 mg total) by mouth daily. 11/10/16   Alvia Grove, PA-C  Doxylamine Succinate, Sleep, (SLEEP AID PO) Take by mouth.    [provider]  latanoprost (XALATAN) 0.005 % ophthalmic solution Place 1 drop into the left eye nightly. 05/14/19 05/13/20  [provider]  losartan (COZAAR) 25 MG tablet TAKE 1 TABLET EACH DAY. 02/05/20   Hilty, Nadean Corwin, MD  metoprolol succinate (TOPROL-XL) 100 MG 24 hr tablet TAKE 1/2 TABLET EVERY DAY. 11/20/19   Hilty, Nadean Corwin, MD  Multiple Vitamin (MULTI-VITAMINS) TABS Take by mouth.    [provider]  Multiple Vitamins-Minerals (PRESERVISION AREDS 2 PO) Take by mouth.    [provider]  pravastatin (PRAVACHOL) 20 MG tablet TAKE 1 TABLET AT 6PM. 12/05/19   Hilty, Nadean Corwin, MD    Allergies    Morphine and related and Levofloxacin  Review of Systems   Review of Systems  All other systems reviewed and are negative.   Physical Exam Updated Vital Signs BP (!) 132/98 (BP Location: Right Arm)   Pulse 80   Resp 19   SpO2 97%   Physical Exam Vitals and nursing note reviewed.   87 year old female, resting comfortably and in no acute distress. Vital signs are significant for mildly elevated blood pressure. Oxygen saturation is 97%, which is normal. Head is normocephalic and atraumatic. PERRLA, EOMI. There is moderate swelling of the left side of the tongue.  There is no pooling of secretions.  Phonation is normal although mildly dysarthric just secondary to the tongue swelling.  There is  no edema noted of the uvula or pharynx or sublingual tissues. Neck is nontender and supple without adenopathy or JVD. Back is nontender and there is no CVA tenderness. Lungs are clear without rales, wheezes, or rhonchi. Chest is nontender. Heart has regular rate and rhythm without murmur. Abdomen is soft, flat, nontender without masses or hepatosplenomegaly and peristalsis is normoactive. Extremities have no cyanosis or edema, full range of motion is present. Skin is warm and dry without rash. Neurologic: Mental status is normal, cranial nerves are intact, there are no motor or sensory deficits.  ED Results / Procedures / Treatments   Labs (all labs ordered are listed, but only abnormal results are displayed) Labs Reviewed  CBC WITH DIFFERENTIAL/PLATELET - Abnormal; Notable for the following components:      Result  Value   Hemoglobin 16.4 (*)    HCT 49.3 (*)    All other components within normal limits  BASIC METABOLIC PANEL   Procedures Procedures  CRITICAL CARE Performed by: Delora Fuel Total critical care time: 45 minutes Critical care time was exclusive of separately billable procedures and treating other patients. Critical care was necessary to treat or prevent imminent or life-threatening deterioration. Critical care was time spent personally by me on the following activities: development of treatment plan with patient and/or surrogate as well as nursing, discussions with consultants, evaluation of patient's response to treatment, examination of patient, obtaining history from patient or surrogate, ordering and performing treatments and interventions, ordering and review of laboratory studies, ordering and review of radiographic studies, pulse oximetry and re-evaluation of patient's condition.  Medications Ordered in ED Medications  famotidine (PEPCID) IVPB 20 mg premix (20 mg Intravenous New Bag/Given 04/25/20 2313)  methylPREDNISolone sodium succinate (SOLU-MEDROL) 125 mg/2  mL injection 125 mg (125 mg Intravenous Given 04/25/20 2313)  diphenhydrAMINE (BENADRYL) injection 50 mg (50 mg Intravenous Given 04/25/20 2313)    ED Course  I have reviewed the triage vital signs and the nursing notes.  Pertinent labs & imaging results that were available during my care of the patient were reviewed by me and considered in my medical decision making (see chart for details).  MDM Rules/Calculators/A&P Angioedema of the tongue secondary to losartan.  She is given additional diphenhydramine as well as famotidine, methylprednisolone.  Old records are reviewed, and she has no relevant past visits.  She will need to be observed in the emergency department.  12:48 AM Patient feels that the swelling has gone down.  Exam is unchanged.  3:10 AM There has been no progression of her tongue swelling.  Speech is improved compared to arrival.  She has able to take fluids well without any difficulty swallowing.  She is felt to be safe for discharge.  She is discharged with prescription for prednisone, advised to discontinue losartan and avoid ACE inhibitor's and angiotensin receptor blockers in the future.  Follow-up with PCP to get replacement blood pressure medication.  Strict return precautions discussed.  Final Clinical Impression(s) / ED Diagnoses Final diagnoses:  Angioedema, initial encounter  Elevated blood pressure reading with diagnosis of hypertension    Rx / DC Orders ED Discharge Orders         Ordered    predniSONE (DELTASONE) 50 MG tablet  Daily        04/26/20 7353           Delora Fuel, MD 29/92/42 [phone]

## 2020-04-26 LAB — BASIC METABOLIC PANEL
Anion gap: 13 (ref 5–15)
BUN: 12 mg/dL (ref 8–23)
CO2: 24 mmol/L (ref 22–32)
Calcium: 9.7 mg/dL (ref 8.9–10.3)
Chloride: 104 mmol/L (ref 98–111)
Creatinine, Ser: 0.87 mg/dL (ref 0.44–1.00)
GFR, Estimated: >60 mL/min (ref >60)
Glucose, Bld: 98 mg/dL (ref 70–99)
Potassium: 4.6 mmol/L (ref 3.5–5.1)
Sodium: 141 mmol/L (ref 135–145)

## 2020-04-26 MED ORDER — PREDNISONE 50 MG PO TABS: 50.0000 mg | ORAL_TABLET | Freq: Every day | ORAL | 0 refills | Status: DC

## 2020-04-26 NOTE — ED Notes (Addendum)
Upon entering room to d/c patient while she was in the restroom. Upon entering, patient noted to have removed IV herself. IV with intact catheter noted on the bed, moderate amount of blood noted to sheets. Educated patient on proper procedure for IV removal. Patient noted to have a large bruise noted to area. Area inspected and dressing applied. Patient waiting for daughter for d/c.

## 2020-04-26 NOTE — Discharge Instructions (Signed)
Stop taking Losartan - it is what caused your tongue to swell. Do not ever take any medication that is an ACE inhibitor or ARB. Any of them can cause the same reaction. You will need to talk with your primary care provider to get a medication to replace it.  Take diphenhydramine (Benadryl) as needed.  Return IMMEDIATELY if your tongue feels like it is getting worse.

## 2020-04-26 NOTE — ED Notes (Signed)
Patient noted to have decreased oral swelling, voice less guttural.

## 2020-05-13 ENCOUNTER — Other Ambulatory Visit: Payer: Self-pay | Admitting: Internal Medicine

## 2020-05-14 ENCOUNTER — Telehealth: Payer: Self-pay | Admitting: Internal Medicine

## 2020-05-14 ENCOUNTER — Other Ambulatory Visit: Payer: Self-pay

## 2020-05-14 ENCOUNTER — Other Ambulatory Visit: Payer: Self-pay | Admitting: *Deleted

## 2020-05-14 DIAGNOSIS — E782 Mixed hyperlipidemia: Secondary | ICD-10-CM | POA: Diagnosis not present

## 2020-05-14 DIAGNOSIS — I712 Thoracic aortic aneurysm, without rupture, unspecified: Secondary | ICD-10-CM

## 2020-05-14 MED ORDER — METOPROLOL SUCCINATE ER 100 MG PO TB24: 50.0000 mg | ORAL_TABLET | Freq: Every day | ORAL | 1 refills | Status: DC

## 2020-05-14 NOTE — Telephone Encounter (Signed)
*  STAT* If patient is at the pharmacy, call can be transferred to refill team.   1. Which medications need to be refilled? (please list name of each medication and dose if known)  metoprolol succinate (TOPROL-XL) 100 MG 24 hr tablet 1/2 tablet once daily   2. Which pharmacy/location (including street and city if local pharmacy) is medication to be sent to? [facility], [address]  3. Do they need a 30 day or 90 day supply? [place]

## 2020-05-19 ENCOUNTER — Encounter: Payer: Self-pay | Admitting: Cardiothoracic Surgery

## 2020-05-19 ENCOUNTER — Ambulatory Visit (INDEPENDENT_AMBULATORY_CARE_PROVIDER_SITE_OTHER): Payer: Medicare Other | Admitting: Cardiothoracic Surgery

## 2020-05-19 ENCOUNTER — Other Ambulatory Visit: Payer: Self-pay

## 2020-05-19 ENCOUNTER — Ambulatory Visit
Admission: RE | Admit: 2020-05-19 | Discharge: 2020-05-19 | Disposition: A | Discharge status: Home / Self Care | Payer: Medicare Other | Source: Ambulatory Visit | Attending: Cardiothoracic Surgery | Admitting: Cardiothoracic Surgery

## 2020-05-19 VITALS — BP 158/89 | HR 76 | Temp 98.2°F | Resp 20 | Wt 170.0 lb

## 2020-05-19 DIAGNOSIS — J9811 Atelectasis: Secondary | ICD-10-CM | POA: Diagnosis not present

## 2020-05-19 DIAGNOSIS — I712 Thoracic aortic aneurysm, without rupture, unspecified: Secondary | ICD-10-CM

## 2020-05-19 DIAGNOSIS — I700 Atherosclerosis of aorta: Secondary | ICD-10-CM | POA: Diagnosis not present

## 2020-05-19 DIAGNOSIS — J984 Other disorders of lung: Secondary | ICD-10-CM | POA: Diagnosis not present

## 2020-05-19 MED ORDER — AMLODIPINE BESYLATE 5 MG PO TABS: 5.0000 mg | ORAL_TABLET | Freq: Every day | ORAL | 11 refills | Status: DC

## 2020-05-19 NOTE — Progress Notes (Signed)
87 year old lady presents for evaluation of ascending aortic aneurysm.  She has been diagnosed with a approximately 4.8 cm aneurysm in the past but has been lost to follow-up for nearly a year.  Her last examination was by an MRI.  She states that she recently stopped her losartan antihypertensive due to angioedema.  Her blood pressure is inconsistently measured or controlled.   Current Outpatient Medications:  .  amLODipine (NORVASC) 5 MG tablet, Take 1 tablet (5 mg total) by mouth daily., Disp: 30 tablet, Rfl: 11 .  aspirin 325 MG tablet, Take 1 tablet (325 mg total) by mouth daily., Disp: , Rfl:  .  cholecalciferol (VITAMIN D3) 25 MCG (1000 UNIT) tablet, Take 1,000 Units by mouth daily., Disp: , Rfl:  .  doxylamine, Sleep, (SLEEP AID) 25 MG tablet, Take 1 tablet by mouth at bedtime as needed for sleep., Disp: , Rfl:  .  metoprolol succinate (TOPROL-XL) 100 MG 24 hr tablet, Take 0.5 tablets (50 mg total) by mouth daily. Take with or immediately following a meal., Disp: 45 tablet, Rfl: 1 .  Polyethyl Glycol-Propyl Glycol (SYSTANE) 0.4-0.3 % SOLN, Place 1 drop into both eyes daily as needed for dry eyes., Disp: , Rfl:  .  pravastatin (PRAVACHOL) 20 MG tablet, TAKE 1 TABLET AT 6PM. (Patient taking differently: Take 10 mg by mouth daily.), Disp: 30 tablet, Rfl: 1 .  timolol (TIMOPTIC) 0.5 % ophthalmic solution, Place 1 drop into the left eye daily., Disp: , Rfl:    Physical exam: BP (!) 158/89 (BP Location: Right Arm, Patient Position: Sitting, Cuff Size: Normal)   Pulse 76   Temp 98.2 F (36.8 C)   Resp 20   Wt 77.1 kg   SpO2 99% Comment: RA  BMI 27.44 kg/m  Well-appearing elderly lady no acute distress Clear to auscultation  regular rate and rhythm no murmur Extremities no edema  Imaging: I personally reviewed her available imaging studies including CT scan of the chest from 05/19/2020 which demonstrates a 4.8 to 5 cm ascending aortic aneurysm.  Impression: 87 year old lady with  hypertension and an apparently enlarging aneurysm although this is still significantly less than the benchmark of 5.5 used to justify elective surgery.  Plan: Prescription is given for Norvasc 5 mg p.o. daily Suggest checking blood pressures early in the morning prior to starting the day. Follow-up in 1 month with blood pressure recordings Reports severe back pain chest pain or headaches to local emergency department  Regina Geisel Z. Regina James Seen, [place]

## 2020-05-26 DIAGNOSIS — H40001 Preglaucoma, unspecified, right eye: Secondary | ICD-10-CM | POA: Diagnosis not present

## 2020-05-26 DIAGNOSIS — H401122 Primary open-angle glaucoma, left eye, moderate stage: Secondary | ICD-10-CM | POA: Diagnosis not present

## 2020-06-12 ENCOUNTER — Emergency Department (HOSPITAL_COMMUNITY)
Admission: EM | Admit: 2020-06-12 | Discharge: 2020-06-12 | Disposition: A | Discharge status: Home / Self Care | Payer: Medicare Other | Attending: Emergency Medicine | Admitting: Emergency Medicine

## 2020-06-12 ENCOUNTER — Emergency Department (HOSPITAL_COMMUNITY): Payer: Medicare Other

## 2020-06-12 ENCOUNTER — Encounter (HOSPITAL_COMMUNITY): Payer: Self-pay

## 2020-06-12 DIAGNOSIS — Z79899 Other long term (current) drug therapy: Secondary | ICD-10-CM | POA: Diagnosis not present

## 2020-06-12 DIAGNOSIS — S0181XA Laceration without foreign body of other part of head, initial encounter: Secondary | ICD-10-CM | POA: Diagnosis not present

## 2020-06-12 DIAGNOSIS — S01112A Laceration without foreign body of left eyelid and periocular area, initial encounter: Secondary | ICD-10-CM | POA: Insufficient documentation

## 2020-06-12 DIAGNOSIS — Z859 Personal history of malignant neoplasm, unspecified: Secondary | ICD-10-CM | POA: Insufficient documentation

## 2020-06-12 DIAGNOSIS — Z87891 Personal history of nicotine dependence: Secondary | ICD-10-CM | POA: Diagnosis not present

## 2020-06-12 DIAGNOSIS — Z7982 Long term (current) use of aspirin: Secondary | ICD-10-CM | POA: Diagnosis not present

## 2020-06-12 DIAGNOSIS — W01198A Fall on same level from slipping, tripping and stumbling with subsequent striking against other object, initial encounter: Secondary | ICD-10-CM | POA: Insufficient documentation

## 2020-06-12 DIAGNOSIS — W19XXXA Unspecified fall, initial encounter: Secondary | ICD-10-CM

## 2020-06-12 DIAGNOSIS — I639 Cerebral infarction, unspecified: Secondary | ICD-10-CM | POA: Diagnosis not present

## 2020-06-12 DIAGNOSIS — G9389 Other specified disorders of brain: Secondary | ICD-10-CM | POA: Diagnosis not present

## 2020-06-12 DIAGNOSIS — S0990XA Unspecified injury of head, initial encounter: Secondary | ICD-10-CM | POA: Diagnosis not present

## 2020-06-12 DIAGNOSIS — I10 Essential (primary) hypertension: Secondary | ICD-10-CM | POA: Diagnosis not present

## 2020-06-12 MED ORDER — LIDOCAINE-EPINEPHRINE (PF) 2 %-1:200000 IJ SOLN
10.0000 mL | Freq: Once | INTRAMUSCULAR | Status: AC
  Administered 2020-06-12: 10 mL
  Filled 2020-06-12: qty 20

## 2020-06-12 NOTE — ED Triage Notes (Signed)
Pt presents with c/o fall that occurred today. Pt reports that she takes one low dose aspirin every day. Pt does have a lac above her left eyebrow, bleeding controlled, covered with a band-aid.

## 2020-06-12 NOTE — ED Provider Notes (Signed)
[facility] DEPT Provider Note   CSN: [id number] Arrival date & time: 06/12/20  1304     History Chief Complaint  Patient presents with  . Fall    Regina James is a 87 y.o. female.  Presents to ER with concern for fall.  Reports that she tripped on the ground, struck the left side of her face.  No loss of consciousness, no nausea, vomiting, headaches.  Does have some discomfort around the site of injury.  Bleeding stopped with direct pressure.  Takes baby aspirin.  Reports history of prior stroke.  HPI     Past Medical History:  Diagnosis Date  . Cancer ([place])    basal cell carcinoma  . History of PSVT (paroxysmal supraventricular tachycardia)    Reported back as far as 2010.  Marland Kitchen Palpitations   . Stroke [facility])     Patient Active Problem List   Diagnosis Date Noted  . Carotid stenosis, left 01/03/2017  . History of CEA (carotid endarterectomy) 01/03/2017  . Thoracic aortic aneurysm without rupture ([place]) 01/03/2017  . Subclavian artery stenosis, left ([place]) 01/03/2017  . Right sided weakness   . History of basal cell carcinoma   . PSVT (paroxysmal supraventricular tachycardia) ([place])   . History of CVA with residual deficit   . Medically noncompliant   . Acute blood loss anemia   . Dysphagia, post-stroke   . Leukocytosis   . Tachypnea   . Mixed dyslipidemia 11/01/2016  . Essential hypertension 11/01/2016  . Tachyarrhythmia 11/01/2016  . Internal carotid artery stenosis, left 11/01/2016  . Aortic arch aneurysm ([place]) 11/01/2016  . Cerebrovascular accident (CVA) due to embolism of left middle cerebral artery ([place]) 10/31/2016  . Right arm weakness 06/27/2016  . Right arm numbness 06/27/2016  . Deviated septum 06/27/2016  . Disorder of ethmoidal sinus 06/27/2016  . Acute ischemic stroke ([place])   . Dysarthria   . Paroxysmal SVT (supraventricular tachycardia) ([place]) 05/21/2016  . Angiopathy, peripheral ([place]) 01/07/2015  . Cloudy posterior  capsule 06/01/2013  . Pseudoaphakia 05/02/2013  . Malaise and fatigue 02/16/2013  . Cataract, nuclear 01/28/2012  . Palpitations 02/17/2011  . Claudication ([place]) 02/17/2011  . Tobacco abuse 02/17/2011    Past Surgical History:  Procedure Laterality Date  . APPENDECTOMY  1993  . CHOLECYSTECTOMY  1996  . ENDARTERECTOMY Left 11/03/2016   Procedure: Exploration of Carotid  ENDARTERECTOMY.;  Surgeon: Elam Dutch, MD;  Location: [facility];  Service: Vascular;  Laterality: Left;  . ENDARTERECTOMY Left 11/03/2016   Procedure: ENDARTERECTOMY CAROTID;  Surgeon: Elam Dutch, MD;  Location: [facility];  Service: Vascular;  Laterality: Left;  . LOOP RECORDER INSERTION N/A 11/02/2016   Procedure: Loop Recorder Insertion;  Surgeon: [name], MD;  Location: [facility] CV LAB;  Service: Cardiovascular;  Laterality: N/A;  . ROTATOR CUFF REPAIR Right 1990  . TEE WITHOUT CARDIOVERSION N/A 11/02/2016   Procedure: TRANSESOPHAGEAL ECHOCARDIOGRAM (TEE);  Surgeon: Lelon Perla, MD;  Location[facility] ENDOSCOPY;  Service: Cardiovascular;  Laterality: N/A;  . US ECHOCARDIOGRAPHY  01/09/2009   EF 55-60%  . WRIST FRACTURE SURGERY       OB History   No obstetric history on file.     Family History  Problem Relation Age of Onset  . Cancer Father        prostate  . Diabetes Father   . Varicose Veins Mother   . Cardiomyopathy Mother   . Kidney cancer Brother   . Arrhythmia Brother  Social History   Tobacco Use  . Smoking status: Former Smoker    Packs/day: 0.25    Years: 45.00    Pack years: 11.25    Types: Cigarettes    Quit date: 06/10/2016    Years since quitting: 4.0  . Smokeless tobacco: Never Used  Vaping Use  . Vaping Use: Never used  Substance Use Topics  . Alcohol use: Yes    Alcohol/week: 7.0 standard drinks    Types: 7 Glasses of wine per week    Comment: 1 glass wine daily  . Drug use: No    Home Medications Prior to Admission medications   Medication Sig Start Date  End Date Taking? Authorizing Provider  amLODipine (NORVASC) 5 MG tablet Take 1 tablet (5 mg total) by mouth daily. 05/19/20 05/19/21  Wonda Olds, MD  aspirin 325 MG tablet Take 1 tablet (325 mg total) by mouth daily. 11/10/16   Alvia Grove, PA-C  cholecalciferol (VITAMIN D3) 25 MCG (1000 UNIT) tablet Take 1,000 Units by mouth daily.    [provider]  doxylamine, Sleep, (SLEEP AID) 25 MG tablet Take 1 tablet by mouth at bedtime as needed for sleep.    [provider]  metoprolol succinate (TOPROL-XL) 100 MG 24 hr tablet Take 0.5 tablets (50 mg total) by mouth daily. Take with or immediately following a meal. 05/14/20   Hilty, Nadean Corwin, MD  Polyethyl Glycol-Propyl Glycol (SYSTANE) 0.4-0.3 % SOLN Place 1 drop into both eyes daily as needed for dry eyes.    [provider]  pravastatin (PRAVACHOL) 20 MG tablet TAKE 1 TABLET AT 6PM. Patient taking differently: Take 10 mg by mouth daily. 12/05/19   Hilty, Nadean Corwin, MD  timolol (TIMOPTIC) 0.5 % ophthalmic solution Place 1 drop into the left eye daily. 01/07/20   [provider]  losartan (COZAAR) 25 MG tablet TAKE 1 TABLET EACH DAY. 02/05/20 04/26/20  Pixie Casino, MD    Allergies    Losartan, Morphine and related, and Levofloxacin  Review of Systems   Review of Systems  Constitutional: Negative for chills and fever.  HENT: Negative for ear pain and sore throat.   Eyes: Negative for pain and visual disturbance.  Respiratory: Negative for cough and shortness of breath.   Cardiovascular: Negative for chest pain and palpitations.  Gastrointestinal: Negative for abdominal pain and vomiting.  Genitourinary: Negative for dysuria and hematuria.  Musculoskeletal: Negative for arthralgias and back pain.  Skin: Negative for color change and rash.  Neurological: Negative for seizures and syncope.  All other systems reviewed and are negative.   Physical Exam Updated Vital Signs BP 111/65   Pulse (!) 55    Temp 98.3 F (36.8 C) (Oral)   Resp 18   SpO2 99%   Physical Exam Vitals and nursing note reviewed.  Constitutional:      General: She is not in acute distress.    Appearance: She is well-developed.  HENT:     Head: Normocephalic and atraumatic.     Comments: 1 cm laceration just superior to the left eyebrow, eye is unaffected, superficial abrasion over left cheek Eyes:     Conjunctiva/sclera: Conjunctivae normal.  Cardiovascular:     Rate and Rhythm: Normal rate.     Pulses: Normal pulses.  Pulmonary:     Effort: Pulmonary effort is normal. No respiratory distress.  Abdominal:     Palpations: Abdomen is soft.     Tenderness: There is no abdominal tenderness.  Musculoskeletal:  General: No deformity or signs of injury.     Cervical back: Neck supple.  Skin:    General: Skin is warm and dry.  Neurological:     General: No focal deficit present.     Mental Status: She is alert and oriented to person, place, and time.  Psychiatric:        Mood and Affect: Mood normal.        Behavior: Behavior normal.     ED Results / Procedures / Treatments   Labs (all labs ordered are listed, but only abnormal results are displayed) Labs Reviewed - No data to display  EKG None  Radiology CT Head Wo Contrast  Result Date: 06/12/2020 CLINICAL DATA:  Head trauma.  Fall.  Laceration above left eyebrow. EXAM: CT HEAD WITHOUT CONTRAST TECHNIQUE: Contiguous axial images were obtained from the base of the skull through the vertex without intravenous contrast. COMPARISON:  CT November 01, 2016. FINDINGS: Brain: No evidence of acute large vascular territory infarction, hemorrhage, hydrocephalus, extra-axial collection or mass lesion/mass effect. Patchy white matter hypoattenuation, most likely related to chronic microvascular ischemic disease. Mild for age generalized cerebral volume loss with ex vacuo ventricular dilation. Small remote left frontal cortical infarct. Vascular: Calcific  atherosclerosis. No hyperdense vessel identified. Skull: Left periorbital contusion/laceration without acute fracture. Sinuses/Orbits: Visualized sinuses are clear.  Unremarkable orbits. Other: No mastoid effusions. IMPRESSION: 1. No evidence of acute intracranial abnormality. 2. Left periorbital contusion/laceration without acute fracture. 3. Remove left frontal cortical infarct and chronic microvascular ischemic disease. Electronically Signed   By: Margaretha Sheffield MD   On: 06/12/2020 18:00    Procedures .Marland KitchenLaceration Repair  Date/Time: 06/12/2020 6:44 PM Performed by: Lucrezia Starch, MD Authorized by: Lucrezia Starch, MD   Consent:    Consent obtained:  Verbal   Consent given by:  Patient   Risks, benefits, and alternatives were discussed: yes   Laceration details:    Location:  Face   Face location:  Forehead   Length (cm):  1 Treatment:    Area cleansed with:  Saline   Amount of cleaning:  Standard   Irrigation solution:  Sterile saline   Irrigation method:  Syringe   Visualized foreign bodies/material removed: no     Debridement:  None Skin repair:    Repair method:  Sutures   Suture size:  5-0   Suture material:  Fast-absorbing gut   Suture technique:  Simple interrupted   Number of sutures:  2 Approximation:    Approximation:  Close Repair type:    Repair type:  Simple Post-procedure details:    Dressing:  Adhesive bandage   Procedure completion:  Tolerated well, no immediate complications     Medications Ordered in ED Medications  lidocaine-EPINEPHrine (XYLOCAINE W/EPI) 2 %-1:200000 (PF) injection 10 mL (10 mLs Infiltration Given by Other 06/12/20 1617)    ED Course  I have reviewed the triage vital signs and the nursing notes.  Pertinent labs & imaging results that were available during my care of the patient were reviewed by me and considered in my medical decision making (see chart for details).    MDM Rules/Calculators/A&P                          87 year old lady presents to ER with concern for fall, head laceration.  Noted to have a small laceration just above her left eyebrow.  CT head was negative.  Laceration repaired at bedside.  Discharged home with daughter.   After the discussed management above, the patient was determined to be safe for discharge.  The patient was in agreement with this plan and all questions regarding their care were answered.  ED return precautions were discussed and the patient will return to the ED with any significant worsening of condition.   Final Clinical Impression(s) / ED Diagnoses Final diagnoses:  Fall, initial encounter  Facial laceration, initial encounter    Rx / DC Orders ED Discharge Orders    None       Lucrezia Starch, MD 06/12/20 1845

## 2020-06-12 NOTE — Discharge Instructions (Addendum)
Keep wound clean and dry.  Return if you have any significant redness, pus drainage, fever, vomiting or other new concerning symptom.

## 2020-06-16 ENCOUNTER — Encounter: Payer: Medicare Other | Admitting: Cardiothoracic Surgery

## 2020-06-25 DIAGNOSIS — H40001 Preglaucoma, unspecified, right eye: Secondary | ICD-10-CM | POA: Diagnosis not present

## 2020-06-25 DIAGNOSIS — H401122 Primary open-angle glaucoma, left eye, moderate stage: Secondary | ICD-10-CM | POA: Diagnosis not present

## 2020-06-27 ENCOUNTER — Ambulatory Visit: Payer: PRIVATE HEALTH INSURANCE | Admitting: Allergy

## 2020-08-04 DIAGNOSIS — I712 Thoracic aortic aneurysm, without rupture: Secondary | ICD-10-CM | POA: Diagnosis not present

## 2020-08-04 DIAGNOSIS — I700 Atherosclerosis of aorta: Secondary | ICD-10-CM | POA: Diagnosis not present

## 2020-08-04 DIAGNOSIS — I129 Hypertensive chronic kidney disease with stage 1 through stage 4 chronic kidney disease, or unspecified chronic kidney disease: Secondary | ICD-10-CM | POA: Diagnosis not present

## 2020-08-04 DIAGNOSIS — E782 Mixed hyperlipidemia: Secondary | ICD-10-CM | POA: Diagnosis not present

## 2020-08-04 DIAGNOSIS — I6523 Occlusion and stenosis of bilateral carotid arteries: Secondary | ICD-10-CM | POA: Diagnosis not present

## 2020-08-04 DIAGNOSIS — F17210 Nicotine dependence, cigarettes, uncomplicated: Secondary | ICD-10-CM | POA: Diagnosis not present

## 2020-08-04 DIAGNOSIS — Z6827 Body mass index (BMI) 27.0-27.9, adult: Secondary | ICD-10-CM | POA: Diagnosis not present

## 2020-08-13 DIAGNOSIS — H43813 Vitreous degeneration, bilateral: Secondary | ICD-10-CM | POA: Diagnosis not present

## 2020-08-13 DIAGNOSIS — H401122 Primary open-angle glaucoma, left eye, moderate stage: Secondary | ICD-10-CM | POA: Diagnosis not present

## 2020-08-13 DIAGNOSIS — H353131 Nonexudative age-related macular degeneration, bilateral, early dry stage: Secondary | ICD-10-CM | POA: Diagnosis not present

## 2020-08-13 DIAGNOSIS — Z961 Presence of intraocular lens: Secondary | ICD-10-CM | POA: Diagnosis not present

## 2020-08-13 DIAGNOSIS — H40001 Preglaucoma, unspecified, right eye: Secondary | ICD-10-CM | POA: Diagnosis not present

## 2020-09-24 DIAGNOSIS — H40001 Preglaucoma, unspecified, right eye: Secondary | ICD-10-CM | POA: Diagnosis not present

## 2020-09-24 DIAGNOSIS — H401122 Primary open-angle glaucoma, left eye, moderate stage: Secondary | ICD-10-CM | POA: Diagnosis not present

## 2020-11-17 ENCOUNTER — Other Ambulatory Visit: Payer: Self-pay | Admitting: Internal Medicine

## 2020-12-11 DIAGNOSIS — Z961 Presence of intraocular lens: Secondary | ICD-10-CM | POA: Diagnosis not present

## 2020-12-11 DIAGNOSIS — H401122 Primary open-angle glaucoma, left eye, moderate stage: Secondary | ICD-10-CM | POA: Diagnosis not present

## 2020-12-11 DIAGNOSIS — H43813 Vitreous degeneration, bilateral: Secondary | ICD-10-CM | POA: Diagnosis not present

## 2020-12-11 DIAGNOSIS — H3589 Other specified retinal disorders: Secondary | ICD-10-CM | POA: Diagnosis not present

## 2020-12-11 DIAGNOSIS — H353131 Nonexudative age-related macular degeneration, bilateral, early dry stage: Secondary | ICD-10-CM | POA: Diagnosis not present

## 2021-01-28 DIAGNOSIS — H401122 Primary open-angle glaucoma, left eye, moderate stage: Secondary | ICD-10-CM | POA: Diagnosis not present

## 2021-01-28 DIAGNOSIS — H40001 Preglaucoma, unspecified, right eye: Secondary | ICD-10-CM | POA: Diagnosis not present

## 2021-01-29 DIAGNOSIS — L814 Other melanin hyperpigmentation: Secondary | ICD-10-CM | POA: Diagnosis not present

## 2021-01-29 DIAGNOSIS — L570 Actinic keratosis: Secondary | ICD-10-CM | POA: Diagnosis not present

## 2021-01-29 DIAGNOSIS — D0471 Carcinoma in situ of skin of right lower limb, including hip: Secondary | ICD-10-CM | POA: Diagnosis not present

## 2021-01-29 DIAGNOSIS — Z85828 Personal history of other malignant neoplasm of skin: Secondary | ICD-10-CM | POA: Diagnosis not present

## 2021-01-29 DIAGNOSIS — L821 Other seborrheic keratosis: Secondary | ICD-10-CM | POA: Diagnosis not present

## 2021-02-16 DIAGNOSIS — H401122 Primary open-angle glaucoma, left eye, moderate stage: Secondary | ICD-10-CM | POA: Diagnosis not present

## 2021-02-16 DIAGNOSIS — H40001 Preglaucoma, unspecified, right eye: Secondary | ICD-10-CM | POA: Diagnosis not present

## 2021-02-18 DIAGNOSIS — E782 Mixed hyperlipidemia: Secondary | ICD-10-CM | POA: Diagnosis not present

## 2021-02-18 DIAGNOSIS — Z0000 Encounter for general adult medical examination without abnormal findings: Secondary | ICD-10-CM | POA: Diagnosis not present

## 2021-02-18 DIAGNOSIS — I700 Atherosclerosis of aorta: Secondary | ICD-10-CM | POA: Diagnosis not present

## 2021-02-18 DIAGNOSIS — Z23 Encounter for immunization: Secondary | ICD-10-CM | POA: Diagnosis not present

## 2021-02-18 DIAGNOSIS — F17210 Nicotine dependence, cigarettes, uncomplicated: Secondary | ICD-10-CM | POA: Diagnosis not present

## 2021-02-18 DIAGNOSIS — I712 Thoracic aortic aneurysm, without rupture, unspecified: Secondary | ICD-10-CM | POA: Diagnosis not present

## 2021-02-18 DIAGNOSIS — I129 Hypertensive chronic kidney disease with stage 1 through stage 4 chronic kidney disease, or unspecified chronic kidney disease: Secondary | ICD-10-CM | POA: Diagnosis not present

## 2021-02-18 DIAGNOSIS — N183 Chronic kidney disease, stage 3 unspecified: Secondary | ICD-10-CM | POA: Diagnosis not present

## 2021-03-05 DIAGNOSIS — M76822 Posterior tibial tendinitis, left leg: Secondary | ICD-10-CM | POA: Diagnosis not present

## 2021-03-20 DIAGNOSIS — D72829 Elevated white blood cell count, unspecified: Secondary | ICD-10-CM | POA: Diagnosis not present

## 2021-03-26 DIAGNOSIS — H401122 Primary open-angle glaucoma, left eye, moderate stage: Secondary | ICD-10-CM | POA: Diagnosis not present

## 2021-03-26 DIAGNOSIS — H40001 Preglaucoma, unspecified, right eye: Secondary | ICD-10-CM | POA: Diagnosis not present

## 2021-05-13 DIAGNOSIS — R059 Cough, unspecified: Secondary | ICD-10-CM | POA: Diagnosis not present

## 2021-05-13 DIAGNOSIS — U071 COVID-19: Secondary | ICD-10-CM | POA: Diagnosis not present

## 2021-05-17 ENCOUNTER — Other Ambulatory Visit: Payer: Self-pay | Admitting: Internal Medicine

## 2021-06-25 DIAGNOSIS — H40001 Preglaucoma, unspecified, right eye: Secondary | ICD-10-CM | POA: Diagnosis not present

## 2021-06-25 DIAGNOSIS — H401122 Primary open-angle glaucoma, left eye, moderate stage: Secondary | ICD-10-CM | POA: Diagnosis not present

## 2021-08-21 ENCOUNTER — Other Ambulatory Visit: Payer: Self-pay | Admitting: Internal Medicine

## 2021-08-26 ENCOUNTER — Telehealth: Payer: Self-pay | Admitting: Internal Medicine

## 2021-08-26 MED ORDER — METOPROLOL SUCCINATE ER 100 MG PO TB24: 100.0000 mg | ORAL_TABLET | Freq: Every day | ORAL | 3 refills | Status: DC

## 2021-08-26 NOTE — Telephone Encounter (Signed)
*  STAT* If patient is at the pharmacy, call can be transferred to refill team.   1. Which medications need to be refilled? (please list name of each medication and dose if known)  metoprolol succinate (TOPROL-XL) 100 MG 24 hr tablet  2. Which pharmacy/location (including street and city if local pharmacy) is medication to be sent to? [facility], [address]  3. Do they need a 30 day or 90 day supply? 90 day

## 2021-10-01 ENCOUNTER — Other Ambulatory Visit: Payer: Self-pay | Admitting: Family Medicine

## 2021-10-01 ENCOUNTER — Ambulatory Visit
Admission: RE | Admit: 2021-10-01 | Discharge: 2021-10-01 | Disposition: A | Discharge status: Home / Self Care | Payer: Medicare Other | Source: Ambulatory Visit | Attending: Family Medicine | Admitting: Family Medicine

## 2021-10-01 DIAGNOSIS — I2510 Atherosclerotic heart disease of native coronary artery without angina pectoris: Secondary | ICD-10-CM | POA: Diagnosis not present

## 2021-10-01 DIAGNOSIS — K760 Fatty (change of) liver, not elsewhere classified: Secondary | ICD-10-CM | POA: Diagnosis not present

## 2021-10-01 DIAGNOSIS — F17210 Nicotine dependence, cigarettes, uncomplicated: Secondary | ICD-10-CM | POA: Diagnosis not present

## 2021-10-01 DIAGNOSIS — R1030 Lower abdominal pain, unspecified: Secondary | ICD-10-CM | POA: Diagnosis not present

## 2021-10-01 DIAGNOSIS — R1900 Intra-abdominal and pelvic swelling, mass and lump, unspecified site: Secondary | ICD-10-CM | POA: Diagnosis not present

## 2021-10-01 DIAGNOSIS — Q631 Lobulated, fused and horseshoe kidney: Secondary | ICD-10-CM | POA: Diagnosis not present

## 2021-10-01 DIAGNOSIS — N281 Cyst of kidney, acquired: Secondary | ICD-10-CM | POA: Diagnosis not present

## 2021-10-01 MED ORDER — IOPAMIDOL (ISOVUE-300) INJECTION 61%
100.0000 mL | Freq: Once | INTRAVENOUS | Status: AC | PRN
  Administered 2021-10-01: 100 mL via INTRAVENOUS

## 2021-10-02 ENCOUNTER — Other Ambulatory Visit: Payer: Self-pay | Admitting: Family Medicine

## 2021-10-02 DIAGNOSIS — N838 Other noninflammatory disorders of ovary, fallopian tube and broad ligament: Secondary | ICD-10-CM

## 2021-10-03 ENCOUNTER — Ambulatory Visit
Admission: RE | Admit: 2021-10-03 | Discharge: 2021-10-03 | Disposition: A | Discharge status: Home / Self Care | Payer: Medicare Other | Source: Ambulatory Visit | Attending: Family Medicine | Admitting: Family Medicine

## 2021-10-03 ENCOUNTER — Other Ambulatory Visit: Payer: Medicare Other

## 2021-10-03 DIAGNOSIS — N83201 Unspecified ovarian cyst, right side: Secondary | ICD-10-CM | POA: Diagnosis not present

## 2021-10-03 DIAGNOSIS — Q631 Lobulated, fused and horseshoe kidney: Secondary | ICD-10-CM | POA: Diagnosis not present

## 2021-10-03 DIAGNOSIS — N838 Other noninflammatory disorders of ovary, fallopian tube and broad ligament: Secondary | ICD-10-CM

## 2021-10-03 MED ORDER — GADOBENATE DIMEGLUMINE 529 MG/ML IV SOLN
15.0000 mL | Freq: Once | INTRAVENOUS | Status: AC | PRN
  Administered 2021-10-03: 15 mL via INTRAVENOUS

## 2021-10-09 ENCOUNTER — Telehealth: Payer: Self-pay | Admitting: *Deleted

## 2021-10-09 NOTE — Telephone Encounter (Signed)
Spoke with the patient regarding the referral to GYN oncology. Patient scheduled a new patient with Dr Berline Lopes  on 7/24 at 10:30 am Patient given an arrival time of 10 am .  Explained to the patient the the doctor will perform a pelvic exam at this visit. Patient given the policy that no visitors under the 16 yrs are a loud in the [facility]. Patient given the address/phone number for the clinic and that the center offers free valet service.

## 2021-10-15 DIAGNOSIS — L309 Dermatitis, unspecified: Secondary | ICD-10-CM | POA: Diagnosis not present

## 2021-10-16 DIAGNOSIS — H401122 Primary open-angle glaucoma, left eye, moderate stage: Secondary | ICD-10-CM | POA: Diagnosis not present

## 2021-10-16 DIAGNOSIS — H43813 Vitreous degeneration, bilateral: Secondary | ICD-10-CM | POA: Diagnosis not present

## 2021-10-16 DIAGNOSIS — H40001 Preglaucoma, unspecified, right eye: Secondary | ICD-10-CM | POA: Diagnosis not present

## 2021-10-16 DIAGNOSIS — Z961 Presence of intraocular lens: Secondary | ICD-10-CM | POA: Diagnosis not present

## 2021-10-16 DIAGNOSIS — H353131 Nonexudative age-related macular degeneration, bilateral, early dry stage: Secondary | ICD-10-CM | POA: Diagnosis not present

## 2021-10-22 ENCOUNTER — Emergency Department (HOSPITAL_COMMUNITY): Payer: Medicare Other

## 2021-10-22 ENCOUNTER — Encounter (HOSPITAL_COMMUNITY): Payer: Self-pay

## 2021-10-22 ENCOUNTER — Emergency Department (HOSPITAL_COMMUNITY)
Admission: EM | Admit: 2021-10-22 | Discharge: 2021-10-22 | Disposition: A | Discharge status: Home / Self Care | Payer: Medicare Other | Attending: Emergency Medicine | Admitting: Emergency Medicine

## 2021-10-22 ENCOUNTER — Other Ambulatory Visit: Payer: Self-pay

## 2021-10-22 DIAGNOSIS — N839 Noninflammatory disorder of ovary, fallopian tube and broad ligament, unspecified: Secondary | ICD-10-CM | POA: Diagnosis not present

## 2021-10-22 DIAGNOSIS — Z79899 Other long term (current) drug therapy: Secondary | ICD-10-CM | POA: Insufficient documentation

## 2021-10-22 DIAGNOSIS — R102 Pelvic and perineal pain: Secondary | ICD-10-CM

## 2021-10-22 DIAGNOSIS — Z7982 Long term (current) use of aspirin: Secondary | ICD-10-CM | POA: Insufficient documentation

## 2021-10-22 DIAGNOSIS — R079 Chest pain, unspecified: Secondary | ICD-10-CM | POA: Diagnosis not present

## 2021-10-22 DIAGNOSIS — N838 Other noninflammatory disorders of ovary, fallopian tube and broad ligament: Secondary | ICD-10-CM

## 2021-10-22 DIAGNOSIS — R109 Unspecified abdominal pain: Secondary | ICD-10-CM | POA: Diagnosis not present

## 2021-10-22 DIAGNOSIS — R110 Nausea: Secondary | ICD-10-CM | POA: Diagnosis not present

## 2021-10-22 DIAGNOSIS — Q631 Lobulated, fused and horseshoe kidney: Secondary | ICD-10-CM | POA: Diagnosis not present

## 2021-10-22 DIAGNOSIS — R1032 Left lower quadrant pain: Secondary | ICD-10-CM | POA: Diagnosis not present

## 2021-10-22 LAB — LACTIC ACID, PLASMA
Lactic Acid, Venous: 2.1 mmol/L (ref 0.5–1.9)
Lactic Acid, Venous: 2.2 mmol/L (ref 0.5–1.9)
Lactic Acid, Venous: 2.6 mmol/L (ref 0.5–1.9)

## 2021-10-22 LAB — URINALYSIS, ROUTINE W REFLEX MICROSCOPIC
Bilirubin Urine: NEGATIVE
Glucose, UA: NEGATIVE mg/dL
Hgb urine dipstick: NEGATIVE
Ketones, ur: NEGATIVE mg/dL
Leukocytes,Ua: NEGATIVE
Nitrite: NEGATIVE
Protein, ur: NEGATIVE mg/dL
Specific Gravity, Urine: 1.009 (ref 1.005–1.030)
pH: 5 (ref 5.0–8.0)

## 2021-10-22 LAB — CBC WITH DIFFERENTIAL/PLATELET
Abs Immature Granulocytes: 0.06 10*3/uL (ref 0.00–0.07)
Basophils Absolute: 0.1 10*3/uL (ref 0.0–0.1)
Basophils Relative: 1 %
Eosinophils Absolute: 0.4 10*3/uL (ref 0.0–0.5)
Eosinophils Relative: 3 %
HCT: 47.2 % — ABNORMAL HIGH (ref 36.0–46.0)
Hemoglobin: 15.7 g/dL — ABNORMAL HIGH (ref 12.0–15.0)
Immature Granulocytes: 1 %
Lymphocytes Relative: 20 %
Lymphs Abs: 2.4 10*3/uL (ref 0.7–4.0)
MCH: 31.4 pg (ref 26.0–34.0)
MCHC: 33.3 g/dL (ref 30.0–36.0)
MCV: 94.4 fL (ref 80.0–100.0)
Monocytes Absolute: 0.7 10*3/uL (ref 0.1–1.0)
Monocytes Relative: 6 %
Neutro Abs: 8.4 10*3/uL — ABNORMAL HIGH (ref 1.7–7.7)
Neutrophils Relative %: 69 %
Platelets: 507 10*3/uL — ABNORMAL HIGH (ref 150–400)
RBC: 5 MIL/uL (ref 3.87–5.11)
RDW: 16 % — ABNORMAL HIGH (ref 11.5–15.5)
WBC: 12 10*3/uL — ABNORMAL HIGH (ref 4.0–10.5)
nRBC: 0 % (ref 0.0–0.2)

## 2021-10-22 LAB — COMPREHENSIVE METABOLIC PANEL
ALT: 15 U/L (ref 0–44)
AST: 23 U/L (ref 15–41)
Albumin: 3.5 g/dL (ref 3.5–5.0)
Alkaline Phosphatase: 91 U/L (ref 38–126)
Anion gap: 12 (ref 5–15)
BUN: 19 mg/dL (ref 8–23)
CO2: 22 mmol/L (ref 22–32)
Calcium: 8.8 mg/dL — ABNORMAL LOW (ref 8.9–10.3)
Chloride: 106 mmol/L (ref 98–111)
Creatinine, Ser: 0.97 mg/dL (ref 0.44–1.00)
GFR, Estimated: 57 mL/min — ABNORMAL LOW (ref >60)
Glucose, Bld: 111 mg/dL — ABNORMAL HIGH (ref 70–99)
Potassium: 3.9 mmol/L (ref 3.5–5.1)
Sodium: 140 mmol/L (ref 135–145)
Total Bilirubin: 0.7 mg/dL (ref 0.3–1.2)
Total Protein: 6.7 g/dL (ref 6.5–8.1)

## 2021-10-22 LAB — TROPONIN I (HIGH SENSITIVITY)
Troponin I (High Sensitivity): 8 ng/L (ref <18)
Troponin I (High Sensitivity): 7 ng/L (ref <18)

## 2021-10-22 LAB — LIPASE, BLOOD: Lipase: 63 U/L — ABNORMAL HIGH (ref 11–51)

## 2021-10-22 MED ORDER — ONDANSETRON HCL 4 MG/2ML IJ SOLN
4.0000 mg | Freq: Once | INTRAMUSCULAR | Status: AC
  Administered 2021-10-22: 4 mg via INTRAVENOUS
  Filled 2021-10-22: qty 2

## 2021-10-22 MED ORDER — FENTANYL CITRATE PF 50 MCG/ML IJ SOSY
50.0000 ug | PREFILLED_SYRINGE | Freq: Once | INTRAMUSCULAR | Status: AC
  Administered 2021-10-22: 50 ug via INTRAVENOUS
  Filled 2021-10-22: qty 1

## 2021-10-22 MED ORDER — SODIUM CHLORIDE 0.9 % IV BOLUS
1000.0000 mL | Freq: Once | INTRAVENOUS | Status: AC
  Administered 2021-10-22: 1000 mL via INTRAVENOUS

## 2021-10-22 MED ORDER — IOHEXOL 300 MG/ML  SOLN
100.0000 mL | Freq: Once | INTRAMUSCULAR | Status: AC | PRN
  Administered 2021-10-22: 100 mL via INTRAVENOUS

## 2021-10-22 MED ORDER — OXYCODONE-ACETAMINOPHEN 5-325 MG PO TABS: 1.0000 | ORAL_TABLET | Freq: Four times a day (QID) | ORAL | 0 refills | Status: DC | PRN

## 2021-10-22 MED ORDER — ONDANSETRON HCL 4 MG PO TABS: 4.0000 mg | ORAL_TABLET | Freq: Three times a day (TID) | ORAL | 0 refills | Status: DC | PRN

## 2021-10-22 NOTE — ED Triage Notes (Signed)
Pt daughter states pt was diagnosed with a mass on her right ovary 3 weeks ago. Pt daughter states pt was bitten by something last week, was given prednisone at the urgent care. Pt sees Dr. Berline Lopes at the cancer center. Today pt c/o abdominal pain and nausea. Pt hypotensive in triage. Pt currently 90/61 in Res B.

## 2021-10-22 NOTE — Discharge Instructions (Signed)
You were seen in the emergency department for lower abdominal pain.  Your blood pressure was low here and you were given some IV fluids.  Your CAT scan did not show any significant changes.  Please keep your appointment with Dr. Berline Lopes as scheduled.  Return to the emergency department if any worsening or concerning symptoms.  We have sent off prescription for some pain medicine and nausea medication to your pharmacy.  The pain medication may make you dizzy lightheaded and constipated so please use caution.

## 2021-10-22 NOTE — ED Provider Notes (Signed)
[facility] DEPT Provider Note   CSN: [id number] Arrival date & time: 10/22/21  1130     History  Chief Complaint  Patient presents with   Abdominal Pain   Nausea    Regina James is a 88 y.o. female.  She has a prior history of stroke, thoracic aneurysm.  About 3 weeks ago she started having some left lower quadrant abdominal pain and had a CAT scan and MRI showing an ovarian mass possible neoplasm.  She is referred on to Dr. Berline Lopes and is supposed to see her next week.  She has had on and off abdominal pain mostly worse when moving sitting.  Today she was driving to the dentist office when she had worsening left lower quadrant abdominal pain associated with nausea and feeling lightheaded like she might pass out.  She called her daughter who brought her here to the emergency department.  Initial blood pressures in the 90s.  She said she has been eating and drinking well although did not have much today.  No fevers or chills no chest pain or shortness of breath.  No urinary symptoms no vaginal bleeding.  Currently rates her pain as 5 out of 10 when she is not moving.  The history is provided by the patient.  Abdominal Pain Pain location:  LLQ Pain quality: aching and stabbing   Pain severity:  Severe Onset quality:  Sudden Duration:  2 hours Timing:  Intermittent Progression:  Partially resolved Chronicity:  Recurrent Context: not trauma   Relieved by:  None tried Worsened by:  Movement and position changes Ineffective treatments:  None tried Associated symptoms: nausea   Associated symptoms: no chest pain, no constipation, no cough, no diarrhea, no dysuria, no fever, no hematuria, no shortness of breath, no vaginal bleeding and no vomiting        Home Medications Prior to Admission medications   Medication Sig Start Date End Date Taking? Authorizing Provider  amLODipine (NORVASC) 5 MG tablet Take 1 tablet (5 mg total) by mouth daily. 05/19/20  05/19/21  Wonda Olds, MD  aspirin 325 MG tablet Take 1 tablet (325 mg total) by mouth daily. 11/10/16   Alvia Grove, PA-C  cholecalciferol (VITAMIN D3) 25 MCG (1000 UNIT) tablet Take 1,000 Units by mouth daily.    [provider]  doxylamine, Sleep, (SLEEP AID) 25 MG tablet Take 1 tablet by mouth at bedtime as needed for sleep.    [provider]  metoprolol succinate (TOPROL-XL) 100 MG 24 hr tablet Take 1 tablet (100 mg total) by mouth daily. Schedule an appointment for further refills, 2nd attempt 08/26/21   Pixie Casino, MD  Polyethyl Glycol-Propyl Glycol (SYSTANE) 0.4-0.3 % SOLN Place 1 drop into both eyes daily as needed for dry eyes.    [provider]  pravastatin (PRAVACHOL) 20 MG tablet TAKE 1 TABLET AT 6PM. Patient taking differently: Take 10 mg by mouth daily. 12/05/19   Hilty, Nadean Corwin, MD  timolol (TIMOPTIC) 0.5 % ophthalmic solution Place 1 drop into the left eye daily. 01/07/20   [provider]  losartan (COZAAR) 25 MG tablet TAKE 1 TABLET EACH DAY. 02/05/20 04/26/20  Pixie Casino, MD      Allergies    Losartan, Morphine and related, and Levofloxacin    Review of Systems   Review of Systems  Constitutional:  Negative for fever.  Respiratory:  Negative for cough and shortness of breath.   Cardiovascular:  Negative for chest  pain.  Gastrointestinal:  Positive for abdominal pain and nausea. Negative for constipation, diarrhea and vomiting.  Genitourinary:  Negative for dysuria, hematuria and vaginal bleeding.  Neurological:  Positive for dizziness and light-headedness.    Physical Exam Updated Vital Signs BP 90/61 (BP Location: Left Arm)   Pulse 64   Temp 98.2 F (36.8 C) (Oral)   Resp 18   SpO2 93%  Physical Exam Vitals and nursing note reviewed.  Constitutional:      General: She is not in acute distress.    Appearance: Normal appearance. She is well-developed.  HENT:     Head: Normocephalic and atraumatic.  Eyes:      Conjunctiva/sclera: Conjunctivae normal.  Cardiovascular:     Rate and Rhythm: Normal rate and regular rhythm.     Heart sounds: No murmur heard. Pulmonary:     Effort: Pulmonary effort is normal. No respiratory distress.     Breath sounds: Normal breath sounds.  Abdominal:     Palpations: Abdomen is soft.     Tenderness: There is abdominal tenderness in the left lower quadrant. There is no guarding or rebound.  Musculoskeletal:        General: No swelling.     Cervical back: Neck supple.  Skin:    General: Skin is warm and dry.     Capillary Refill: Capillary refill takes less than 2 seconds.  Neurological:     General: No focal deficit present.     Mental Status: She is alert.     ED Results / Procedures / Treatments   Labs (all labs ordered are listed, but only abnormal results are displayed) Labs Reviewed  CBC WITH DIFFERENTIAL/PLATELET - Abnormal; Notable for the following components:      Result Value   WBC 12.0 (*)    Hemoglobin 15.7 (*)    HCT 47.2 (*)    RDW 16.0 (*)    Platelets 507 (*)    Neutro Abs 8.4 (*)    All other components within normal limits  COMPREHENSIVE METABOLIC PANEL - Abnormal; Notable for the following components:   Glucose, Bld 111 (*)    Calcium 8.8 (*)    GFR, Estimated 57 (*)    All other components within normal limits  LIPASE, BLOOD - Abnormal; Notable for the following components:   Lipase 63 (*)    All other components within normal limits  LACTIC ACID, PLASMA - Abnormal; Notable for the following components:   Lactic Acid, Venous 2.1 (*)    All other components within normal limits  LACTIC ACID, PLASMA - Abnormal; Notable for the following components:   Lactic Acid, Venous 2.2 (*)    All other components within normal limits  URINALYSIS, ROUTINE W REFLEX MICROSCOPIC  LACTIC ACID, PLASMA  TROPONIN I (HIGH SENSITIVITY)  TROPONIN I (HIGH SENSITIVITY)    EKG EKG Interpretation  Date/Time:  Thursday October 22 2021 11:58:14  EDT Ventricular Rate:  61 PR Interval:  166 QRS Duration: 78 QT Interval:  411 QTC Calculation: 414 R Axis:   -24 Text Interpretation: Sinus rhythm Ventricular premature complex Borderline left axis deviation Borderline repolarization abnormality No significant change since prior 7/18 Confirmed by Aletta Edouard [id number]) on 10/22/2021 12:17:15 PM  Radiology CT Abdomen Pelvis W Contrast  Result Date: 10/22/2021 CLINICAL DATA:  Left lower quadrant abdominal pain EXAM: CT ABDOMEN AND PELVIS WITH CONTRAST TECHNIQUE: Multidetector CT imaging of the abdomen and pelvis was performed using the standard protocol following bolus administration of intravenous contrast. RADIATION  DOSE REDUCTION: This exam was performed according to the departmental dose-optimization program which includes automated exposure control, adjustment of the mA and/or kV according to patient size and/or use of iterative reconstruction technique. CONTRAST:  161m OMNIPAQUE IOHEXOL 300 MG/ML  SOLN COMPARISON:  CT abdomen and pelvis 10/01/2021; MRI pelvis 09/03/2021 FINDINGS: Lower chest: Scarring/atelectasis in the bilateral lower lobes and lingula. Hepatobiliary: No focal liver abnormality is seen. Status post cholecystectomy. Similar dilation of the common bile duct measuring 12 mm likely related to post cholecystectomy state. Pancreas: Redemonstrated 13 mm low density area in the distal body of the pancreas favored to represent a side branch IPMN. Dilated main pancreatic duct measuring 6 mm in maximum diameter, unchanged. No active inflammatory change. Spleen: Normal in size without focal abnormality. Adrenals/Urinary Tract: Horseshoe kidney. Unchanged simple appearing renal cysts. No urinary calculi or hydronephrosis. Decompressed bladder. Unremarkable adrenal glands. Stomach/Bowel: Stomach is within normal limits. Appendectomy. No evidence of bowel wall thickening, distention, or inflammatory changes. Vascular/Lymphatic: Moderate  aortoiliac atherosclerotic disease. Reproductive: There is a 8 x 11 x 14 mixed cystic and solid mass within the pelvis superior to the uterus concerning for malignancy possibly arising from the right ovary. This is slightly increased in size from CT 10/01/2021 when it measured approximately 7 x 9 x 13 cm using similar measuring technique. Other: New small volume perihepatic and perisplenic fluid tracking inferiorly into the pericolic gutters and pelvis. Musculoskeletal: Anterolisthesis L4 on L5. IMPRESSION: 1. Slightly increased size of the mixed cystic and solid mass presumably arising from the right ovary concerning for primary ovarian malignancy. 2. New small volume abdominopelvic ascites. 3. Horseshoe kidney 4.  Aortic Atherosclerosis (ICD10-I70.0). Electronically Signed   By: TPlacido SouM.D.   On: 10/22/2021 15:23   DG Chest Port 1 View  Result Date: 10/22/2021 CLINICAL DATA:  Abdominal pain with nausea today. EXAM: PORTABLE CHEST 1 VIEW COMPARISON:  Radiographs 11/04/2016.  CT 05/19/2020. FINDINGS: 1225 hours. The heart is mildly enlarged. There is aortic atherosclerosis. The lungs are clear. No pleural effusion or pneumothorax. Glenohumeral degenerative changes are present bilaterally with evidence of a chronic rotator cuff tear on the right. No acute osseous findings. Telemetry leads overlie the chest. Probable loop recorder within the left upper anterior abdominal wall. IMPRESSION: No evidence of active cardiopulmonary process.  Cardiomegaly. Electronically Signed   By: WRichardean SaleM.D.   On: 10/22/2021 13:10    Procedures Procedures    Medications Ordered in ED Medications  sodium chloride 0.9 % bolus 1,000 mL (0 mLs Intravenous Stopped 10/22/21 1405)  ondansetron (ZOFRAN) injection 4 mg (4 mg Intravenous Given 10/22/21 1223)  fentaNYL (SUBLIMAZE) injection 50 mcg (50 mcg Intravenous Given 10/22/21 1222)  iohexol (OMNIPAQUE) 300 MG/ML solution 100 mL (100 mLs Intravenous Contrast Given  10/22/21 1427)  sodium chloride 0.9 % bolus 1,000 mL (1,000 mLs Intravenous New Bag/Given 10/22/21 1632)    ED Course/ Medical Decision Making/ A&P Clinical Course as of 10/22/21 1834  Thu Oct 22, 2021  1320 Chest x-ray interpreted by me as no acute infiltrates no free air.  Awaiting radiology reading. [MB]  1602 Discussed with Dr. JDelsa Salegynecology oncology.  She said if the patient was feeling better and she can follow-up as scheduled.  She will have her team contact the patient tomorrow to make sure she is improving. [MB]    Clinical Course User Index [MB] BHayden Rasmussen MD  Medical Decision Making Amount and/or Complexity of Data Reviewed Labs: ordered. Radiology: ordered.  Risk Prescription drug management.  This patient complains of low abdominal pain lightheadedness; this involves an extensive number of treatment Options and is a complaint that carries with it a high risk of complications and morbidity. The differential includes ovarian mass, torsion, UTI, perforation, bleeding, infection  I ordered, reviewed and interpreted labs, which included CBC with mildly elevated white count, hemoglobin stable, chemistries and LFTs unremarkable, lactate mildly elevated, urinalysis without signs of infection, troponins flat I ordered medication IV fluids, nausea and pain medication with improvement in her symptoms and reviewed PMP when indicated. I ordered imaging studies which included CT abdomen and pelvis and I independently    visualized and interpreted imaging which showed slight enlargement of mass with new small volume ascites Additional history obtained from patient's daughter Previous records obtained and reviewed in epic including recent PCP visit I consulted GynOnc Dr. Glennon Mac more and discussed lab and imaging findings and discussed disposition.  Cardiac monitoring reviewed, normal sinus rhythm Social determinants considered, no significant  barriers Critical Interventions: None  After the interventions stated above, I reevaluated the patient and found patient to be symptomatically improved Admission and further testing considered, her care is signed out under provider Dr. Roderic Palau to follow-up on response to IV fluids.  If she is improving she likely can follow-up outpatient with oncology.  Provided prescription for nausea and pain medication.  Return instructions discussed          Final Clinical Impression(s) / ED Diagnoses Final diagnoses:  Pelvic pain  Ovarian mass, left    Rx / DC Orders ED Discharge Orders          Ordered    ondansetron (ZOFRAN) 4 MG tablet  Every 8 hours PRN        10/22/21 1655    oxyCODONE-acetaminophen (PERCOCET/ROXICET) 5-325 MG tablet  Every 6 hours PRN        10/22/21 1655              Hayden Rasmussen, MD 10/22/21 [phone]

## 2021-10-22 NOTE — ED Notes (Signed)
An After Visit Summary was printed and given to the patient. Discharge instructions given and no further questions at this time.  Pt leaving with family.  

## 2021-10-23 ENCOUNTER — Encounter: Payer: Self-pay | Admitting: Gynecologic Oncology

## 2021-10-23 ENCOUNTER — Telehealth: Payer: Self-pay

## 2021-10-23 NOTE — Telephone Encounter (Signed)
Continuation from earlier note, I lvm for pt's daughter [name], to call the office to follow up  from pt being in ER yesterday.

## 2021-10-23 NOTE — Telephone Encounter (Signed)
[name] called back to give an update on Ms.Monical and her ER visit. She states pt was dehydrated and that is why she was feeling lightheaded and having pain.   [employer] was done for upcoming appointment on Monday. [name] was thankful for the call

## 2021-10-23 NOTE — Telephone Encounter (Signed)
Message left for Ms.Pita daughter, [name].

## 2021-10-26 ENCOUNTER — Inpatient Hospital Stay (HOSPITAL_BASED_OUTPATIENT_CLINIC_OR_DEPARTMENT_OTHER): Payer: Medicare Other | Admitting: Gynecologic Oncology

## 2021-10-26 ENCOUNTER — Other Ambulatory Visit: Payer: Self-pay

## 2021-10-26 ENCOUNTER — Encounter: Payer: Self-pay | Admitting: Gynecologic Oncology

## 2021-10-26 ENCOUNTER — Inpatient Hospital Stay: Payer: Medicare Other | Attending: Gynecologic Oncology | Admitting: Gynecologic Oncology

## 2021-10-26 VITALS — BP 133/80 | HR 67 | Temp 98.3°F | Resp 16 | Ht 66.5 in | Wt 163.0 lb

## 2021-10-26 DIAGNOSIS — R197 Diarrhea, unspecified: Secondary | ICD-10-CM | POA: Diagnosis not present

## 2021-10-26 DIAGNOSIS — I471 Supraventricular tachycardia, unspecified: Secondary | ICD-10-CM

## 2021-10-26 DIAGNOSIS — Z79899 Other long term (current) drug therapy: Secondary | ICD-10-CM | POA: Insufficient documentation

## 2021-10-26 DIAGNOSIS — Q631 Lobulated, fused and horseshoe kidney: Secondary | ICD-10-CM | POA: Diagnosis not present

## 2021-10-26 DIAGNOSIS — D398 Neoplasm of uncertain behavior of other specified female genital organs: Secondary | ICD-10-CM | POA: Insufficient documentation

## 2021-10-26 DIAGNOSIS — Z8673 Personal history of transient ischemic attack (TIA), and cerebral infarction without residual deficits: Secondary | ICD-10-CM

## 2021-10-26 DIAGNOSIS — I499 Cardiac arrhythmia, unspecified: Secondary | ICD-10-CM | POA: Diagnosis not present

## 2021-10-26 DIAGNOSIS — Z85828 Personal history of other malignant neoplasm of skin: Secondary | ICD-10-CM | POA: Diagnosis not present

## 2021-10-26 DIAGNOSIS — R6881 Early satiety: Secondary | ICD-10-CM | POA: Insufficient documentation

## 2021-10-26 DIAGNOSIS — R634 Abnormal weight loss: Secondary | ICD-10-CM | POA: Diagnosis not present

## 2021-10-26 DIAGNOSIS — R188 Other ascites: Secondary | ICD-10-CM | POA: Diagnosis not present

## 2021-10-26 DIAGNOSIS — R1904 Left lower quadrant abdominal swelling, mass and lump: Secondary | ICD-10-CM

## 2021-10-26 DIAGNOSIS — R110 Nausea: Secondary | ICD-10-CM | POA: Insufficient documentation

## 2021-10-26 DIAGNOSIS — R002 Palpitations: Secondary | ICD-10-CM | POA: Diagnosis not present

## 2021-10-26 DIAGNOSIS — Z7982 Long term (current) use of aspirin: Secondary | ICD-10-CM | POA: Diagnosis not present

## 2021-10-26 DIAGNOSIS — R32 Unspecified urinary incontinence: Secondary | ICD-10-CM | POA: Diagnosis not present

## 2021-10-26 DIAGNOSIS — R1030 Lower abdominal pain, unspecified: Secondary | ICD-10-CM

## 2021-10-26 DIAGNOSIS — R1900 Intra-abdominal and pelvic swelling, mass and lump, unspecified site: Secondary | ICD-10-CM

## 2021-10-26 DIAGNOSIS — K5909 Other constipation: Secondary | ICD-10-CM | POA: Insufficient documentation

## 2021-10-26 DIAGNOSIS — R180 Malignant ascites: Secondary | ICD-10-CM

## 2021-10-26 MED ORDER — OXYCODONE HCL 5 MG PO TABS: 5.0000 mg | ORAL_TABLET | ORAL | 0 refills | Status: DC | PRN

## 2021-10-26 MED ORDER — SENNOSIDES-DOCUSATE SODIUM 8.6-50 MG PO TABS: 2.0000 | ORAL_TABLET | Freq: Every day | ORAL | 0 refills | Status: DC

## 2021-10-26 NOTE — Patient Instructions (Addendum)
Preparing for your Surgery  Plan for surgery on November 03, 2021 with Dr. Jeral Pinch at [facility] will be scheduled for diagnostic laparoscopy (looking in the abdomen with a camera through a small incision), possible biopsies, possible robotic assisted laparoscopic (through small incisions) versus open (through larger incision) bilateral salpingo-oophorectomy (removal of both ovaries and fallopian tubes), possible total hysterectomy (removal of the uterus and cervix), possible staging if a cancer is identified.   Pre-operative Testing -You will receive a phone call from presurgical testing at [facility] to arrange for a pre-operative appointment and lab work.  -Bring your insurance card, copy of an advanced directive if applicable, medication list  -At that visit, you will be asked to sign a consent for a possible blood transfusion in case a transfusion becomes necessary during surgery.  The need for a blood transfusion is rare but having consent is a necessary part of your care.     -WE WILL REACH OUT TO YOUR PRIMARY CARE PROVIDER ABOUT WHEN TO STOP TAKING YOUR ASPIRIN AND PLAVIX BEFORE SURGERY.  -Do not take supplements such as fish oil (omega 3), red yeast rice, turmeric before your surgery. You want to avoid medications with aspirin in them including headache powders such as BC or Goody's), Excedrin migraine.  Day Before Surgery at [place] will be asked to take in a light diet the day before surgery. You will be advised you can have clear liquids up until 3 hours before your surgery.    Eat a light diet the day before surgery.  Examples including soups, broths, toast, yogurt, mashed potatoes.  AVOID GAS PRODUCING FOODS. Things to avoid include carbonated beverages (fizzy beverages, sodas), raw fruits and raw vegetables (uncooked), or beans.   If your bowels are filled with gas, your surgeon will have difficulty visualizing your pelvic organs which increases  your surgical risks.  Your role in recovery Your role is to become active as soon as directed by your doctor, while still giving yourself time to heal.  Rest when you feel tired. You will be asked to do the following in order to speed your recovery:  - Cough and breathe deeply. This helps to clear and expand your lungs and can prevent pneumonia after surgery.  - [place]. Do mild physical activity. Walking or moving your legs help your circulation and body functions return to normal. Do not try to get up or walk alone the first time after surgery.   -If you develop swelling on one leg or the other, pain in the back of your leg, redness/warmth in one of your legs, please call the office or go to the Emergency Room to have a doppler to rule out a blood clot. For shortness of breath, chest pain-seek care in the Emergency Room as soon as possible. - Actively manage your pain. Managing your pain lets you move in comfort. We will ask you to rate your pain on a scale of zero to 10. It is your responsibility to tell your doctor or nurse where and how much you hurt so your pain can be treated.  Special Considerations -If you are diabetic, you may be placed on insulin after surgery to have closer control over your blood sugars to promote healing and recovery.  This does not mean that you will be discharged on insulin.  If applicable, your oral antidiabetics will be resumed when you are tolerating a solid diet.  -Your final pathology results  from surgery should be available around one week after surgery and the results will be relayed to you when available.  -Dr. Lahoma Crocker is the surgeon that assists your GYN Oncologist with surgery.  If you end up staying the night, the next day after your surgery you will either see Dr. Berline Lopes or Dr. Lahoma Crocker.  -FMLA forms can be faxed to [phone] and please allow 5-7 business days for completion.  Pain Management After  Surgery -You have been prescribed your pain medication and bowel regimen medications before surgery so that you can have these available when you are discharged from the hospital. The pain medication is for use ONLY AFTER surgery and a new prescription will not be given.   -Make sure that you have Tylenol at home to use on a regular basis after surgery for pain control.   -Review the attached handout on narcotic use and their risks and side effects.   Bowel Regimen -You have been prescribed Sennakot-S to take nightly to prevent constipation especially if you are taking the narcotic pain medication intermittently.  It is important to prevent constipation and drink adequate amounts of liquids. You can stop taking this medication when you are not taking pain medication and you are back on your normal bowel routine.  Risks of Surgery Risks of surgery are low but include bleeding, infection, damage to surrounding structures, re-operation, blood clots, and very rarely death.   Blood Transfusion Information (For the consent to be signed before surgery)  We will be checking your blood type before surgery so in case of emergencies, we will know what type of blood you would need.                                            WHAT IS A BLOOD TRANSFUSION?  A transfusion is the replacement of blood or some of its parts. Blood is made up of multiple cells which provide different functions. Red blood cells carry oxygen and are used for blood loss replacement. White blood cells fight against infection. Platelets control bleeding. Plasma helps clot blood. Other blood products are available for specialized needs, such as hemophilia or other clotting disorders. BEFORE THE TRANSFUSION  Who gives blood for transfusions?  You may be able to donate blood to be used at a later date on yourself (autologous donation). Relatives can be asked to donate blood. This is generally not any safer than if you have received  blood from a stranger. The same precautions are taken to ensure safety when a relative's blood is donated. Healthy volunteers who are fully evaluated to make sure their blood is safe. This is blood bank blood. Transfusion therapy is the safest it has ever been in the practice of medicine. Before blood is taken from a donor, a complete history is taken to make sure that person has no history of diseases nor engages in risky social behavior (examples are intravenous drug use or sexual activity with multiple partners). The donor's travel history is screened to minimize risk of transmitting infections, such as malaria. The donated blood is tested for signs of infectious diseases, such as HIV and hepatitis. The blood is then tested to be sure it is compatible with you in order to minimize the chance of a transfusion reaction. If you or a relative donates blood, this is often done in anticipation of surgery and is  not appropriate for emergency situations. It takes many days to process the donated blood. RISKS AND COMPLICATIONS Although transfusion therapy is very safe and saves many lives, the main dangers of transfusion include:  Getting an infectious disease. Developing a transfusion reaction. This is an allergic reaction to something in the blood you were given. Every precaution is taken to prevent this. The decision to have a blood transfusion has been considered carefully by your caregiver before blood is given. Blood is not given unless the benefits outweigh the risks.  AFTER SURGERY INSTRUCTIONS  Return to work: 4-6 weeks if applicable  You may have a white honeycomb dressing over your larger incision. This dressing can be removed 5 days after surgery and you do not need to reapply a new dressing. Once you remove the dressing, you will notice that you have the surgical glue (dermabond) on the incision and this will peel off on its own. You can get this dressing wet in the shower the days after surgery  prior to removal on the 5th day.   Activity: 1. Be up and out of the bed during the day.  Take a nap if needed.  You may walk up steps but be careful and use the hand rail.  Stair climbing will tire you more than you think, you may need to stop part way and rest.   2. No lifting or straining for 6 weeks over 10 pounds. No pushing, pulling, straining for 6 weeks.  3. No driving for around 1 week(s) if you were cleared to drive before surgery.  Do not drive if you are taking narcotic pain medicine and make sure that your reaction time has returned.   4. You can shower as soon as the next day after surgery. Shower daily.  Use your regular soap and water (not directly on the incision) and pat your incision(s) dry afterwards; don't rub.  No tub baths or submerging your body in water until cleared by your surgeon. If you have the soap that was given to you by pre-surgical testing that was used before surgery, you do not need to use it afterwards because this can irritate your incisions.   5. No sexual activity and nothing in the vagina for 4 weeks, 8 weeks if you have a hysterectomy.  6. You may experience a small amount of clear drainage from your incisions, which is normal.  If the drainage persists, increases, or changes color please call the office.  7. Do not use creams, lotions, or ointments such as neosporin on your incisions after surgery until advised by your surgeon because they can cause removal of the dermabond glue on your incisions.    8. You may experience vaginal spotting after surgery or around the 6-8 week mark from surgery when the stitches at the top of the vagina begin to dissolve (if you have a hysterectomy).  The spotting is normal but if you experience heavy bleeding, call our office.  9. Take Tylenol first for pain if you are able to take these medication and only use narcotic pain medication for severe pain not relieved by the Tylenol.  Monitor your Tylenol intake to a max of  4,000 mg in a 24 hour period.   Diet: 1. Low sodium Heart Healthy Diet is recommended but you are cleared to resume your normal (before surgery) diet after your procedure.  2. It is safe to use a laxative, such as Miralax or Colace, if you have difficulty moving your bowels. You  have been prescribed Sennakot-S to take at bedtime every evening after surgery to keep bowel movements regular and to prevent constipation.    Wound Care: 1. Keep clean and dry.  Shower daily.  Reasons to call the Doctor: Fever - Oral temperature greater than 100.4 degrees Fahrenheit Foul-smelling vaginal discharge Difficulty urinating Nausea and vomiting Increased pain at the site of the incision that is unrelieved with pain medicine. Difficulty breathing with or without chest pain New calf pain especially if only on one side Sudden, continuing increased vaginal bleeding with or without clots.   Contacts: For questions or concerns you should contact:  Dr. Jeral Pinch at [phone]  Joylene John, NP at [phone]  After Hours: call [phone] and have the GYN Oncologist paged/contacted (after 5 pm or on the weekends).  Messages sent via mychart are for non-urgent matters and are not responded to after hours so for urgent needs, please call the after hours number.

## 2021-10-26 NOTE — Progress Notes (Signed)
Anesthesia Review:  PCP: Cardiologist : DR Hlty- LOv 12/07/19  Loop recorder  Last dev check 06/04/19  Chest x-ray : 10/22/21- 1 view  EKG : 10/23/21  Echo : Stress test: Cardiac Cath :  Activity level:  Sleep Study/ CPAP : Fasting Blood Sugar :      / Checks Blood Sugar -- times a day:   Blood Thinner/ Instructions /Last Dose: ASA / Instructions/ Last Dose :   325 mg Aspirin  10/22/21- labs of CBC/DIff, CMp lipase, and troponin done - in epic  In ED 10/22/21 with pelvic pain DR Berline Lopes- OV-10/26/21

## 2021-10-26 NOTE — Progress Notes (Signed)
Patient here with her daughter for new patient consultation with Dr. Jeral Pinch and for a pre-operative discussion prior to her scheduled surgery on November 03, 2021. She is scheduled for diagnostic laparoscopy, possible biopsies, possible robotic assisted laparoscopic versus open bilateral salpingo-oophorectomy, possible total hysterectomy, possible staging if a cancer is identified. The surgery was discussed in detail.  See after visit summary for additional details. Visual aids used to discuss items related to surgery including the incentive spirometer, sequential compression stockings, foley catheter, IV pump, multi-modal pain regimen including tylenol, photo of the surgical robot, female reproductive system to discuss surgery in detail.      Discussed post-op pain management in detail including the aspects of the enhanced recovery pathway.  Advised her that a new prescription would be sent in for oxycodone and it is only to be used for after her upcoming surgery.  We discussed the use of tylenol post-op and to monitor for a maximum of 4,000 mg in a 24 hour period.  Also prescribed sennakot to be used after surgery and to hold if having loose stools.  Discussed bowel regimen in detail.     Discussed the use of SCDs and measures to take at home to prevent DVT including frequent mobility.  Reportable signs and symptoms of DVT discussed. Post-operative instructions discussed and expectations for after surgery. Incisional care discussed as well including reportable signs and symptoms including erythema, drainage, wound separation.     5 minutes spent with the patient.  Verbalizing understanding of material discussed. No needs or concerns voiced at the end of the visit.   Advised patient and family to call for any needs.  Advised that her post-operative medications had been prescribed and could be picked up at any time.    This appointment is included in the global surgical bundle as pre-operative  teaching and has no charge.

## 2021-10-26 NOTE — H&P (View-Only) (Signed)
GYNECOLOGIC ONCOLOGY NEW PATIENT CONSULTATION   Patient Name: Regina James  Patient Age: 88 y.o. Date of Service: 10/26/21 Referring Provider: Kathyrn Lass, [place],   [id number]   Primary Care Provider: Kathyrn Lass, MD Consulting Provider: Jeral Pinch, MD   Assessment/Plan:  Postmenopausal patient with complex adnexal mass and new small volume ascites on most recent imaging.  I reviewed with the patient and her daughter imaging findings.  We looked at her most recent imaging together.  In the setting of the appearance of her adnexal mass as well as her recent symptomatology, I am concerned that this represents GYN malignancy, specifically ovarian cancer.  We will plan to get tumor markers today including CA-125 and CEA.  Although there is not CT imaging evidence of large volume metastatic disease, given the patient's GI symptoms, I am concerned that she may have small volume peritoneal disease or carcinomatosis.  We discussed the typical approach in the setting of a pelvic mass, beginning with surgical excision to achieve a diagnosis followed by staging if malignancy or borderline tumor is encountered.  In the setting of her age and significant comorbidities, the patient is at some increased surgical risk.    I recommend that we plan for upcoming diagnostic laparoscopy.  If there are findings of metastatic disease that are not resectable or that would require a significantly morbid surgery to optimally debulk, then we would plan for biopsy and initiation of neoadjuvant chemotherapy.  If no or little intra-abdominal disease was found at the time of diagnostic laparoscopy, then plan would be for excision of the mass with frozen section.  If malignancy identified then additional staging procedures would be performed.  Plan would be to start with a minimally invasive approach.  The patient strong preference is to have the surgery done minimally invasively if  possible.  We discussed that a larger cancer surgery would likely require at least a mini laparotomy for intra-abdominal surface palpation and exploration.  In the setting of her stroke history, we will reach out to her primary care provider for recommendations.  She will need to hold her aspirin for a week.  We will clarify whether she is on Plavix (appears that she is likely just on a statin).  We will also clarify the safety of giving her preoperative and postoperative prophylactic anticoagulation.  We discussed the plan for a diagnostic laparoscopy, possible biopsies, possible open versus robotic assisted bilateral salpingo-oophorectomy, possible total hysterectomy, possible staging including lymph node dissection. The risks of surgery were discussed in detail and she understands these to include infection; wound separation; hernia; vaginal cuff separation, injury to adjacent organs such as bowel, bladder, blood vessels, ureters and nerves; bleeding which may require blood transfusion; anesthesia risk; thromboembolic events; possible death; unforeseen complications; possible need for re-exploration; medical complications such as heart attack, stroke, pleural effusion and pneumonia; and, if full lymphadenectomy is performed the risk of lymphedema and lymphocyst. The patient will receive DVT and antibiotic prophylaxis as indicated. She voiced a clear understanding. She had the opportunity to ask questions. Perioperative instructions were reviewed with her. Prescriptions for post-op medications were sent to her pharmacy of choice.  A copy of this note was sent to the patient's referring provider.   75 minutes of total time was spent for this patient encounter, including preparation, face-to-face counseling with the patient and coordination of care, and documentation of the encounter.   Jeral Pinch, MD  Division of Gynecologic Oncology  Department of Obstetrics  and Gynecology  [facility]  ___________________________________________  Chief Complaint: Chief Complaint  Patient presents with   Pelvic mass    History of Present Illness:  Regina James is a 88 y.o. y.o. female who is seen in consultation at the request of Kathyrn Lass, MD for an evaluation of a complex adnexal mass.  Patient was seen by her primary care provider at the end of June.  She endorsed decreased appetite as well as abdominal pain for approximately 2 weeks.  She also had associated diarrhea and nausea.  Given the symptoms, CT abdomen pelvis was obtained showing a 7 x 8 x 12 cm mixed cystic and solid mass within the pelvis concerning for malignancy, likely arising from the right ovary.  No adenopathy or peritoneal disease noted.  Pelvic MRI was then performed for follow-up on 7/1 which showed a 13.6 x 7.4 x 9.9 cm large mixed solid and cystic mass of the right ovary.  Mass noted to closely abut and possibly directly involve the posterior uterine fundus.  Normal-appearing left ovary.  No pelvic adenopathy.  Partially imaged horseshoe kidney.  The patient was seen in the emergency department on 7/20 for acute worsening of left lower quadrant pain associated with nausea, lightheadedness, and near syncope.  CT was repeated showing slight increase in size of the right adnexal lesion, new small volume abdominal pelvic ascites.  Today, the patient reports that she has had a couple of months of right lower quadrant pain that started gradually and slowly increased in intensity.  By the time she called her primary care doctor, she was very uncomfortable.  She was seen at the end of June and underwent imaging.  She describes the pain now as feeling like a pressure.  She is not currently using medications but was given oxycodone that she has previously used.  She has had some increased urinary frequency and while she was having some mild urinary incontinence, over the last 1 to 2 weeks she is developed  large volume urinary incontinence.  She has had constipation, at baseline prior to using narcotic medication, which has worsened since she started using the oxycodone.  She notes decreased appetite since around the time that her pain started or perhaps slightly before.  She also endorses early satiety and bloating.  She has had about 10 pounds of unintentional weight loss.  She denies any change in abdominal girth.  Medical history is notable for 3 strokes, the last of which was for 5 years ago.  This was related to a blockage in her left carotid artery which was treated with a left carotid artery endarterectomy in 2018.  She denies any stroke since that time.  She is now on baby aspirin.  She initially endorses being on Plavix although on review of her medications with her, it sounds like this may be a statin.  In terms of her cardiac arrhythmia.  She sees cardiologist here in town.  She has an appointment in October.  She endorses a history of what sounds like cervical dysplasia more than 40 years ago with normal Paps subsequently.  She remembers having a procedure in the operating room while she was sleeping on her cervix.  PAST MEDICAL HISTORY:  Past Medical History:  Diagnosis Date   Cancer ([place])    basal cell carcinoma   History of PSVT (paroxysmal supraventricular tachycardia)    Reported back as far as 2010.   Palpitations    Stroke ([place])  PAST SURGICAL HISTORY:  Past Surgical History:  Procedure Laterality Date   APPENDECTOMY  1993   CERVICAL BIOPSY  W/ LOOP ELECTRODE EXCISION     CHOLECYSTECTOMY  1996   ENDARTERECTOMY Left 11/03/2016   Procedure: Exploration of Carotid  ENDARTERECTOMY.;  Surgeon: Elam Dutch, MD;  Location: [facility];  Service: Vascular;  Laterality: Left;   ENDARTERECTOMY Left 11/03/2016   Procedure: ENDARTERECTOMY CAROTID;  Surgeon: Elam Dutch, MD;  Location: [place] [facility];  Service: Vascular;  Laterality: Left;   LOOP RECORDER INSERTION N/A 11/02/2016    Procedure: Loop Recorder Insertion;  Surgeon: [name], MD;  Location: [facility];  Service: Cardiovascular;  Laterality: N/A;   ROTATOR CUFF REPAIR Right 1990   TEE WITHOUT CARDIOVERSION N/A 11/02/2016   Procedure: TRANSESOPHAGEAL ECHOCARDIOGRAM (TEE);  Surgeon: Lelon Perla, MD;  Location: [facility] ENDOSCOPY;  Service: Cardiovascular;  Laterality: N/A;   US ECHOCARDIOGRAPHY  01/09/2009   EF 55-60%   WRIST FRACTURE SURGERY      OB/GYN HISTORY:  OB History  Gravida Para Term Preterm AB Living  6 3          SAB IAB Ectopic Multiple Live Births               # Outcome Date GA Lbr Len/2nd Weight Sex Delivery Anes PTL Lv  6 Gravida           5 Gravida           4 Gravida           3 Para           2 Para           1 Para             No LMP recorded. Patient is postmenopausal.  Age at menarche: 10 Age at menopause: 18 Hx of HRT: Yes, remote history Hx of STDs: Denies Last pap: 2011 History of abnormal pap smears: yes -remote prior history requiring cold knife conization, patient thinks this history was 40+ years ago  SCREENING STUDIES:  Last mammogram: 2020  Last colonoscopy: Approximately 5 years ago   MEDICATIONS: Outpatient Encounter Medications as of 10/26/2021  Medication Sig   amLODipine (NORVASC) 5 MG tablet Take 1 tablet (5 mg total) by mouth daily.   aspirin 325 MG tablet Take 1 tablet (325 mg total) by mouth daily.   cholecalciferol (VITAMIN D3) 25 MCG (1000 UNIT) tablet Take 1,000 Units by mouth daily.   doxylamine, Sleep, (SLEEP AID) 25 MG tablet Take 25 mg by mouth at bedtime as needed for sleep.   latanoprost (XALATAN) 0.005 % ophthalmic solution Place 1 drop into the left eye at bedtime.   metoprolol succinate (TOPROL-XL) 100 MG 24 hr tablet Take 1 tablet (100 mg total) by mouth daily. Schedule an appointment for further refills, 2nd attempt   naproxen sodium (ALEVE) 220 MG tablet Take 220 mg by mouth daily as needed (headache).   ondansetron  (ZOFRAN) 4 MG tablet Take 1 tablet (4 mg total) by mouth every 8 (eight) hours as needed for nausea or vomiting.   oxyCODONE-acetaminophen (PERCOCET/ROXICET) 5-325 MG tablet Take 1 tablet by mouth every 6 (six) hours as needed for severe pain.   Polyethyl Glycol-Propyl Glycol (SYSTANE) 0.4-0.3 % SOLN Place 1 drop into both eyes daily as needed for dry eyes.   pravastatin (PRAVACHOL) 20 MG tablet TAKE 1 TABLET AT 6PM. (Patient taking differently: Take 20 mg by mouth daily.)   predniSONE (  DELTASONE) 20 MG tablet Take 20 mg by mouth daily. Started on 10-15-21 5 DS   timolol (TIMOPTIC) 0.5 % ophthalmic solution Place 1 drop into the left eye 2 (two) times daily.   [DISCONTINUED] losartan (COZAAR) 25 MG tablet TAKE 1 TABLET EACH DAY.   No facility-administered encounter medications on file as of 10/26/2021.    ALLERGIES:  Allergies  Allergen Reactions   Losartan Anaphylaxis    Per daughter and Patient, states that provider suggested she is having allergic reaction to medication   Dorzolamide Hcl-Timolol Mal Itching    Made vision very blurry and itching.Eyes very red.   Morphine And Related Swelling   Levofloxacin Anxiety and Other (See Comments)    Double vision     FAMILY HISTORY:  Family History  Problem Relation Age of Onset   Varicose Veins Mother    Cardiomyopathy Mother    Cancer Father        prostate   Diabetes Father    Kidney cancer Brother    Arrhythmia Brother    Colon cancer Neg Hx    Breast cancer Neg Hx    Ovarian cancer Neg Hx    Endometrial cancer Neg Hx    Pancreatic cancer Neg Hx      SOCIAL HISTORY:  Social Connections: Not on file    REVIEW OF SYSTEMS:  + Decreased appetite, weight loss, hearing loss, vision problems, leg swelling, abdominal distention, abdominal pain, constipation, urinary frequency, incontinence, pelvic pain, problem walking, dizziness, bruising/bleeding easily. Denies fevers, chills, fatigue. Denies neck lumps or masses, mouth sores,  ringing in ears or voice changes. Denies cough or wheezing.  Denies shortness of breath. Denies chest pain or palpitations.  Denies blood in stools, diarrhea, nausea, vomiting, or early satiety. Denies pain with intercourse, dysuria. Denies hot flashes, vaginal bleeding or vaginal discharge.   Denies joint pain, back pain or muscle pain/cramps. Denies itching, rash, or wounds. Denies headaches, numbness or seizures. Denies swollen lymph nodes or glands. Denies anxiety, depression, confusion, or decreased concentration.  Physical Exam:  Vital Signs for this encounter:  Blood pressure 133/80, pulse 67, temperature 98.3 F (36.8 C), temperature source Oral, resp. rate 16, height 5' 6.5" (1.689 m), weight 163 lb (73.9 kg), SpO2 98 %. Body mass index is 25.91 kg/m. General: Alert, oriented, no acute distress.  HEENT: Normocephalic, atraumatic. Sclera anicteric.  Chest: Clear to auscultation bilaterally. No wheezes, rhonchi, or rales. Cardiovascular: Regular rate and rhythm, no murmurs, rubs, or gallops.  Abdomen: Normoactive bowel sounds. Soft, mildly distended and upper abdomen, nontender to palpation.  Mass palpated up to the level of the umbilicus in the midline into the right. No palpable fluid wave.  Extremities: Grossly normal range of motion. Warm, well perfused. No edema bilaterally.  Skin: No rashes or lesions.  Lymphatics: No cervical, supraclavicular, or inguinal adenopathy.  GU:  Normal external female genitalia. No lesions. No discharge or bleeding.             Bladder/urethra:  No lesions or masses.             Vagina: Moderately atrophic, no lesions noted.             Cervix: Cervix somewhat difficult to discern, no obvious external os visible.             Uterus: On bimanual exam, it is difficult to appreciate the uterus, I think I feel a small structure sitting quite anteriorly consistent with a small mobile uterus.  There  is a large, nodular mass within the cul-de-sac  causing mass effect, moderately mobile.             Adnexa: See above.  Rectal: Rectovaginal exam confirms this, rectum feels free.  LABORATORY AND RADIOLOGIC DATA:  Outside medical records were reviewed to synthesize the above history, along with the history and physical obtained during the visit.   CBC    Component Value Date/Time   WBC 12.0 (H) 10/22/2021 1201   RBC 5.00 10/22/2021 1201   HGB 15.7 (H) 10/22/2021 1201   HCT 47.2 (H) 10/22/2021 1201   PLT 507 (H) 10/22/2021 1201   MCV 94.4 10/22/2021 1201   MCH 31.4 10/22/2021 1201   MCHC 33.3 10/22/2021 1201   RDW 16.0 (H) 10/22/2021 1201   LYMPHSABS 2.4 10/22/2021 1201   MONOABS 0.7 10/22/2021 1201   EOSABS 0.4 10/22/2021 1201   BASOSABS 0.1 10/22/2021 1201      Latest Ref Rng & Units 10/22/2021   12:01 PM 04/25/2020   11:47 PM 11/06/2016    3:07 AM  BMP  Glucose 70 - 99 mg/dL 111  98  99   BUN 8 - 23 mg/dL '19  12  10   '$ Creatinine 0.44 - 1.00 mg/dL 0.97  0.87  0.55   Sodium 135 - 145 mmol/L 140  141  138   Potassium 3.5 - 5.1 mmol/L 3.9  4.6  3.6   Chloride 98 - 111 mmol/L 106  104  108   CO2 22 - 32 mmol/L '22  24  23   '$ Calcium 8.9 - 10.3 mg/dL 8.8  9.7  8.2

## 2021-10-26 NOTE — Progress Notes (Unsigned)
GYNECOLOGIC ONCOLOGY NEW PATIENT CONSULTATION   Patient Name: Regina James  Patient Age: 88 y.o. Date of Service: 10/26/21 Referring Provider: Kathyrn Lass, [place],  [place] [address]   Primary Care Provider: Kathyrn Lass, MD Consulting Provider: Jeral Pinch, MD   Assessment/Plan:  Postmenopausal patient with complex adnexal mass and new small volume ascites on most recent imaging.  I reviewed with the patient and her daughter imaging findings.  We looked at her most recent imaging together.  In the setting of the appearance of her adnexal mass as well as her recent symptomatology, I am concerned that this represents GYN malignancy, specifically ovarian cancer.  We will plan to get tumor markers today including CA-125 and CEA.  Although there is not CT imaging evidence of large volume metastatic disease, given the patient's GI symptoms, I am concerned that she may have small volume peritoneal disease or carcinomatosis.  We discussed the typical approach in the setting of a pelvic mass, beginning with surgical excision to achieve a diagnosis followed by staging if malignancy or borderline tumor is encountered.  In the setting of her age and significant comorbidities, the patient is at some increased surgical risk.    I recommend that we plan for upcoming diagnostic laparoscopy.  If there are findings of metastatic disease that are not resectable or that would require a significantly morbid surgery to optimally debulk, then we would plan for biopsy and initiation of neoadjuvant chemotherapy.  If no or little intra-abdominal disease was found at the time of diagnostic laparoscopy, then plan would be for excision of the mass with frozen section.  If malignancy identified then additional staging procedures would be performed.  Plan would be to start with a minimally invasive approach.  The patient strong preference is to have the surgery done minimally invasively if  possible.  We discussed that a larger cancer surgery would likely require at least a mini laparotomy for intra-abdominal surface palpation and exploration.  In the setting of her stroke history, we will reach out to her primary care provider for recommendations.  She will need to hold her aspirin for a week.  We will clarify whether she is on Plavix (appears that she is likely just on a statin).  We will also clarify the safety of giving her preoperative and postoperative prophylactic anticoagulation.  We discussed the plan for a diagnostic laparoscopy, possible biopsies, possible open versus robotic assisted bilateral salpingo-oophorectomy, possible total hysterectomy, possible staging including lymph node dissection. The risks of surgery were discussed in detail and she understands these to include infection; wound separation; hernia; vaginal cuff separation, injury to adjacent organs such as bowel, bladder, blood vessels, ureters and nerves; bleeding which may require blood transfusion; anesthesia risk; thromboembolic events; possible death; unforeseen complications; possible need for re-exploration; medical complications such as heart attack, stroke, pleural effusion and pneumonia; and, if full lymphadenectomy is performed the risk of lymphedema and lymphocyst. The patient will receive DVT and antibiotic prophylaxis as indicated. She voiced a clear understanding. She had the opportunity to ask questions. Perioperative instructions were reviewed with her. Prescriptions for post-op medications were sent to her pharmacy of choice.  A copy of this note was sent to the patient's referring provider.   75 minutes of total time was spent for this patient encounter, including preparation, face-to-face counseling with the patient and coordination of care, and documentation of the encounter.   Jeral Pinch, MD  Division of Gynecologic Oncology  Department of Obstetrics  and Gynecology  [facility]  ___________________________________________  Chief Complaint: Chief Complaint  Patient presents with   Pelvic mass    History of Present Illness:  Regina James is a 88 y.o. y.o. female who is seen in consultation at the request of Kathyrn Lass, MD for an evaluation of a complex adnexal mass.  Patient was seen by her primary care provider at the end of June.  She endorsed decreased appetite as well as abdominal pain for approximately 2 weeks.  She also had associated diarrhea and nausea.  Given the symptoms, CT abdomen pelvis was obtained showing a 7 x 8 x 12 cm mixed cystic and solid mass within the pelvis concerning for malignancy, likely arising from the right ovary.  No adenopathy or peritoneal disease noted.  Pelvic MRI was then performed for follow-up on 7/1 which showed a 13.6 x 7.4 x 9.9 cm large mixed solid and cystic mass of the right ovary.  Mass noted to closely abut and possibly directly involve the posterior uterine fundus.  Normal-appearing left ovary.  No pelvic adenopathy.  Partially imaged horseshoe kidney.  The patient was seen in the emergency department on 7/20 for acute worsening of left lower quadrant pain associated with nausea, lightheadedness, and near syncope.  CT was repeated showing slight increase in size of the right adnexal lesion, new small volume abdominal pelvic ascites.  Today, the patient reports that she has had a couple of months of right lower quadrant pain that started gradually and slowly increased in intensity.  By the time she called her primary care doctor, she was very uncomfortable.  She was seen at the end of June and underwent imaging.  She describes the pain now as feeling like a pressure.  She is not currently using medications but was given oxycodone that she has previously used.  She has had some increased urinary frequency and while she was having some mild urinary incontinence, over the last 1 to 2 weeks she is developed  large volume urinary incontinence.  She has had constipation, at baseline prior to using narcotic medication, which has worsened since she started using the oxycodone.  She notes decreased appetite since around the time that her pain started or perhaps slightly before.  She also endorses early satiety and bloating.  She has had about 10 pounds of unintentional weight loss.  She denies any change in abdominal girth.  Medical history is notable for 3 strokes, the last of which was for 5 years ago.  This was related to a blockage in her left carotid artery which was treated with a left carotid artery endarterectomy in 2018.  She denies any stroke since that time.  She is now on baby aspirin.  She initially endorses being on Plavix although on review of her medications with her, it sounds like this may be a statin.  In terms of her cardiac arrhythmia.  She sees cardiologist here in town.  She has an appointment in October.  She endorses a history of what sounds like cervical dysplasia more than 40 years ago with normal Paps subsequently.  She remembers having a procedure in the operating room while she was sleeping on her cervix.  PAST MEDICAL HISTORY:  Past Medical History:  Diagnosis Date   Cancer ([place])    basal cell carcinoma   History of PSVT (paroxysmal supraventricular tachycardia)    Reported back as far as 2010.   Palpitations    Stroke ([place])  PAST SURGICAL HISTORY:  Past Surgical History:  Procedure Laterality Date   APPENDECTOMY  1993   CERVICAL BIOPSY  W/ LOOP ELECTRODE EXCISION     CHOLECYSTECTOMY  1996   ENDARTERECTOMY Left 11/03/2016   Procedure: Exploration of Carotid  ENDARTERECTOMY.;  Surgeon: Elam Dutch, MD;  Location: [facility];  Service: Vascular;  Laterality: Left;   ENDARTERECTOMY Left 11/03/2016   Procedure: ENDARTERECTOMY CAROTID;  Surgeon: Elam Dutch, MD;  Location: [facility];  Service: Vascular;  Laterality: Left;   LOOP RECORDER INSERTION N/A 11/02/2016    Procedure: Loop Recorder Insertion;  Surgeon: [name], MD;  Location: [facility];  Service: Cardiovascular;  Laterality: N/A;   ROTATOR CUFF REPAIR Right 1990   TEE WITHOUT CARDIOVERSION N/A 11/02/2016   Procedure: TRANSESOPHAGEAL ECHOCARDIOGRAM (TEE);  Surgeon: Lelon Perla, MD;  Location: [facility] ENDOSCOPY;  Service: Cardiovascular;  Laterality: N/A;   US ECHOCARDIOGRAPHY  01/09/2009   EF 55-60%   WRIST FRACTURE SURGERY      OB/GYN HISTORY:  OB History  Gravida Para Term Preterm AB Living  6 3          SAB IAB Ectopic Multiple Live Births               # Outcome Date GA Lbr Len/2nd Weight Sex Delivery Anes PTL Lv  6 Gravida           5 Gravida           4 Gravida           3 Para           2 Para           1 Para             No LMP recorded. Patient is postmenopausal.  Age at menarche: 82 Age at menopause: 54 Hx of HRT: Yes, remote history Hx of STDs: Denies Last pap: 2011 History of abnormal pap smears: yes -remote prior history requiring cold knife conization, patient thinks this history was 40+ years ago  SCREENING STUDIES:  Last mammogram: 2020  Last colonoscopy: Approximately 5 years ago   MEDICATIONS: Outpatient Encounter Medications as of 10/26/2021  Medication Sig   amLODipine (NORVASC) 5 MG tablet Take 1 tablet (5 mg total) by mouth daily.   aspirin 325 MG tablet Take 1 tablet (325 mg total) by mouth daily.   cholecalciferol (VITAMIN D3) 25 MCG (1000 UNIT) tablet Take 1,000 Units by mouth daily.   doxylamine, Sleep, (SLEEP AID) 25 MG tablet Take 25 mg by mouth at bedtime as needed for sleep.   latanoprost (XALATAN) 0.005 % ophthalmic solution Place 1 drop into the left eye at bedtime.   metoprolol succinate (TOPROL-XL) 100 MG 24 hr tablet Take 1 tablet (100 mg total) by mouth daily. Schedule an appointment for further refills, 2nd attempt   naproxen sodium (ALEVE) 220 MG tablet Take 220 mg by mouth daily as needed (headache).   ondansetron  (ZOFRAN) 4 MG tablet Take 1 tablet (4 mg total) by mouth every 8 (eight) hours as needed for nausea or vomiting.   oxyCODONE-acetaminophen (PERCOCET/ROXICET) 5-325 MG tablet Take 1 tablet by mouth every 6 (six) hours as needed for severe pain.   Polyethyl Glycol-Propyl Glycol (SYSTANE) 0.4-0.3 % SOLN Place 1 drop into both eyes daily as needed for dry eyes.   pravastatin (PRAVACHOL) 20 MG tablet TAKE 1 TABLET AT 6PM. (Patient taking differently: Take 20 mg by mouth daily.)   predniSONE (  DELTASONE) 20 MG tablet Take 20 mg by mouth daily. Started on 10-15-21 5 DS   timolol (TIMOPTIC) 0.5 % ophthalmic solution Place 1 drop into the left eye 2 (two) times daily.   [DISCONTINUED] losartan (COZAAR) 25 MG tablet TAKE 1 TABLET EACH DAY.   No facility-administered encounter medications on file as of 10/26/2021.    ALLERGIES:  Allergies  Allergen Reactions   Losartan Anaphylaxis    Per daughter and Patient, states that provider suggested she is having allergic reaction to medication   Dorzolamide Hcl-Timolol Mal Itching    Made vision very blurry and itching.Eyes very red.   Morphine And Related Swelling   Levofloxacin Anxiety and Other (See Comments)    Double vision     FAMILY HISTORY:  Family History  Problem Relation Age of Onset   Varicose Veins Mother    Cardiomyopathy Mother    Cancer Father        prostate   Diabetes Father    Kidney cancer Brother    Arrhythmia Brother    Colon cancer Neg Hx    Breast cancer Neg Hx    Ovarian cancer Neg Hx    Endometrial cancer Neg Hx    Pancreatic cancer Neg Hx      SOCIAL HISTORY:  Social Connections: Not on file    REVIEW OF SYSTEMS:  + Decreased appetite, weight loss, hearing loss, vision problems, leg swelling, abdominal distention, abdominal pain, constipation, urinary frequency, incontinence, pelvic pain, problem walking, dizziness, bruising/bleeding easily. Denies fevers, chills, fatigue. Denies neck lumps or masses, mouth sores,  ringing in ears or voice changes. Denies cough or wheezing.  Denies shortness of breath. Denies chest pain or palpitations.  Denies blood in stools, diarrhea, nausea, vomiting, or early satiety. Denies pain with intercourse, dysuria. Denies hot flashes, vaginal bleeding or vaginal discharge.   Denies joint pain, back pain or muscle pain/cramps. Denies itching, rash, or wounds. Denies headaches, numbness or seizures. Denies swollen lymph nodes or glands. Denies anxiety, depression, confusion, or decreased concentration.  Physical Exam:  Vital Signs for this encounter:  Blood pressure 133/80, pulse 67, temperature 98.3 F (36.8 C), temperature source Oral, resp. rate 16, height 5' 6.5" (1.689 m), weight 163 lb (73.9 kg), SpO2 98 %. Body mass index is 25.91 kg/m. General: Alert, oriented, no acute distress.  HEENT: Normocephalic, atraumatic. Sclera anicteric.  Chest: Clear to auscultation bilaterally. No wheezes, rhonchi, or rales. Cardiovascular: Regular rate and rhythm, no murmurs, rubs, or gallops.  Abdomen: Normoactive bowel sounds. Soft, mildly distended and upper abdomen, nontender to palpation.  Mass palpated up to the level of the umbilicus in the midline into the right. No palpable fluid wave.  Extremities: Grossly normal range of motion. Warm, well perfused. No edema bilaterally.  Skin: No rashes or lesions.  Lymphatics: No cervical, supraclavicular, or inguinal adenopathy.  GU:  Normal external female genitalia. No lesions. No discharge or bleeding.             Bladder/urethra:  No lesions or masses.             Vagina: Moderately atrophic, no lesions noted.             Cervix: Cervix somewhat difficult to discern, no obvious external os visible.             Uterus: On bimanual exam, it is difficult to appreciate the uterus, I think I feel a small structure sitting quite anteriorly consistent with a small mobile uterus.  There  is a large, nodular mass within the cul-de-sac  causing mass effect, moderately mobile.             Adnexa: See above.  Rectal: Rectovaginal exam confirms this, rectum feels free.  LABORATORY AND RADIOLOGIC DATA:  Outside medical records were reviewed to synthesize the above history, along with the history and physical obtained during the visit.   CBC    Component Value Date/Time   WBC 12.0 (H) 10/22/2021 1201   RBC 5.00 10/22/2021 1201   HGB 15.7 (H) 10/22/2021 1201   HCT 47.2 (H) 10/22/2021 1201   PLT 507 (H) 10/22/2021 1201   MCV 94.4 10/22/2021 1201   MCH 31.4 10/22/2021 1201   MCHC 33.3 10/22/2021 1201   RDW 16.0 (H) 10/22/2021 1201   LYMPHSABS 2.4 10/22/2021 1201   MONOABS 0.7 10/22/2021 1201   EOSABS 0.4 10/22/2021 1201   BASOSABS 0.1 10/22/2021 1201      Latest Ref Rng & Units 10/22/2021   12:01 PM 04/25/2020   11:47 PM 11/06/2016    3:07 AM  BMP  Glucose 70 - 99 mg/dL 111  98  99   BUN 8 - 23 mg/dL '19  12  10   '$ Creatinine 0.44 - 1.00 mg/dL 0.97  0.87  0.55   Sodium 135 - 145 mmol/L 140  141  138   Potassium 3.5 - 5.1 mmol/L 3.9  4.6  3.6   Chloride 98 - 111 mmol/L 106  104  108   CO2 22 - 32 mmol/L '22  24  23   '$ Calcium 8.9 - 10.3 mg/dL 8.8  9.7  8.2

## 2021-10-26 NOTE — Patient Instructions (Signed)
Preparing for your Surgery   Plan for surgery on November 03, 2021 with Dr. Jeral Pinch at [facility] will be scheduled for diagnostic laparoscopy (looking in the abdomen with a camera through a small incision), possible biopsies, possible robotic assisted laparoscopic (through small incisions) versus open (through larger incision) bilateral salpingo-oophorectomy (removal of both ovaries and fallopian tubes), possible total hysterectomy (removal of the uterus and cervix), possible staging if a cancer is identified.    Pre-operative Testing -You will receive a phone call from presurgical testing at [facility] to arrange for a pre-operative appointment and lab work.   -Bring your insurance card, copy of an advanced directive if applicable, medication list   -At that visit, you will be asked to sign a consent for a possible blood transfusion in case a transfusion becomes necessary during surgery.  The need for a blood transfusion is rare but having consent is a necessary part of your care.      -WE WILL REACH OUT TO YOUR PRIMARY CARE PROVIDER ABOUT WHEN TO STOP TAKING YOUR ASPIRIN AND PLAVIX BEFORE SURGERY.   -Do not take supplements such as fish oil (omega 3), red yeast rice, turmeric before your surgery. You want to avoid medications with aspirin in them including headache powders such as BC or Goody's), Excedrin migraine.   Day Before Surgery at [place] will be asked to take in a light diet the day before surgery. You will be advised you can have clear liquids up until 3 hours before your surgery.     Eat a light diet the day before surgery.  Examples including soups, broths, toast, yogurt, mashed potatoes.  AVOID GAS PRODUCING FOODS. Things to avoid include carbonated beverages (fizzy beverages, sodas), raw fruits and raw vegetables (uncooked), or beans.    If your bowels are filled with gas, your surgeon will have difficulty visualizing your pelvic organs which  increases your surgical risks.   Your role in recovery Your role is to become active as soon as directed by your doctor, while still giving yourself time to heal.  Rest when you feel tired. You will be asked to do the following in order to speed your recovery:   - Cough and breathe deeply. This helps to clear and expand your lungs and can prevent pneumonia after surgery.  - [place]. Do mild physical activity. Walking or moving your legs help your circulation and body functions return to normal. Do not try to get up or walk alone the first time after surgery.   -If you develop swelling on one leg or the other, pain in the back of your leg, redness/warmth in one of your legs, please call the office or go to the Emergency Room to have a doppler to rule out a blood clot. For shortness of breath, chest pain-seek care in the Emergency Room as soon as possible. - Actively manage your pain. Managing your pain lets you move in comfort. We will ask you to rate your pain on a scale of zero to 10. It is your responsibility to tell your doctor or nurse where and how much you hurt so your pain can be treated.   Special Considerations -If you are diabetic, you may be placed on insulin after surgery to have closer control over your blood sugars to promote healing and recovery.  This does not mean that you will be discharged on insulin.  If applicable, your oral antidiabetics will be resumed  when you are tolerating a solid diet.   -Your final pathology results from surgery should be available around one week after surgery and the results will be relayed to you when available.   -Dr. Lahoma Crocker is the surgeon that assists your GYN Oncologist with surgery.  If you end up staying the night, the next day after your surgery you will either see Dr. Berline Lopes or Dr. Lahoma Crocker.   -FMLA forms can be faxed to [phone] and please allow 5-7 business days for completion.   Pain  Management After Surgery -You have been prescribed your pain medication and bowel regimen medications before surgery so that you can have these available when you are discharged from the hospital. The pain medication is for use ONLY AFTER surgery and a new prescription will not be given.    -Make sure that you have Tylenol at home to use on a regular basis after surgery for pain control.    -Review the attached handout on narcotic use and their risks and side effects.    Bowel Regimen -You have been prescribed Sennakot-S to take nightly to prevent constipation especially if you are taking the narcotic pain medication intermittently.  It is important to prevent constipation and drink adequate amounts of liquids. You can stop taking this medication when you are not taking pain medication and you are back on your normal bowel routine.   Risks of Surgery Risks of surgery are low but include bleeding, infection, damage to surrounding structures, re-operation, blood clots, and very rarely death.     Blood Transfusion Information (For the consent to be signed before surgery)   We will be checking your blood type before surgery so in case of emergencies, we will know what type of blood you would need.                                             WHAT IS A BLOOD TRANSFUSION?   A transfusion is the replacement of blood or some of its parts. Blood is made up of multiple cells which provide different functions. Red blood cells carry oxygen and are used for blood loss replacement. White blood cells fight against infection. Platelets control bleeding. Plasma helps clot blood. Other blood products are available for specialized needs, such as hemophilia or other clotting disorders. BEFORE THE TRANSFUSION  Who gives blood for transfusions?  You may be able to donate blood to be used at a later date on yourself (autologous donation). Relatives can be asked to donate blood. This is generally not any safer  than if you have received blood from a stranger. The same precautions are taken to ensure safety when a relative's blood is donated. Healthy volunteers who are fully evaluated to make sure their blood is safe. This is blood bank blood. Transfusion therapy is the safest it has ever been in the practice of medicine. Before blood is taken from a donor, a complete history is taken to make sure that person has no history of diseases nor engages in risky social behavior (examples are intravenous drug use or sexual activity with multiple partners). The donor's travel history is screened to minimize risk of transmitting infections, such as malaria. The donated blood is tested for signs of infectious diseases, such as HIV and hepatitis. The blood is then tested to be sure it is compatible with you in order  to minimize the chance of a transfusion reaction. If you or a relative donates blood, this is often done in anticipation of surgery and is not appropriate for emergency situations. It takes many days to process the donated blood. RISKS AND COMPLICATIONS Although transfusion therapy is very safe and saves many lives, the main dangers of transfusion include:  Getting an infectious disease. Developing a transfusion reaction. This is an allergic reaction to something in the blood you were given. Every precaution is taken to prevent this. The decision to have a blood transfusion has been considered carefully by your caregiver before blood is given. Blood is not given unless the benefits outweigh the risks.   AFTER SURGERY INSTRUCTIONS   Return to work: 4-6 weeks if applicable   You may have a white honeycomb dressing over your larger incision. This dressing can be removed 5 days after surgery and you do not need to reapply a new dressing. Once you remove the dressing, you will notice that you have the surgical glue (dermabond) on the incision and this will peel off on its own. You can get this dressing wet in the  shower the days after surgery prior to removal on the 5th day.    Activity: 1. Be up and out of the bed during the day.  Take a nap if needed.  You may walk up steps but be careful and use the hand rail.  Stair climbing will tire you more than you think, you may need to stop part way and rest.    2. No lifting or straining for 6 weeks over 10 pounds. No pushing, pulling, straining for 6 weeks.   3. No driving for around 1 week(s) if you were cleared to drive before surgery.  Do not drive if you are taking narcotic pain medicine and make sure that your reaction time has returned.    4. You can shower as soon as the next day after surgery. Shower daily.  Use your regular soap and water (not directly on the incision) and pat your incision(s) dry afterwards; don't rub.  No tub baths or submerging your body in water until cleared by your surgeon. If you have the soap that was given to you by pre-surgical testing that was used before surgery, you do not need to use it afterwards because this can irritate your incisions.    5. No sexual activity and nothing in the vagina for 4 weeks, 8 weeks if you have a hysterectomy.   6. You may experience a small amount of clear drainage from your incisions, which is normal.  If the drainage persists, increases, or changes color please call the office.   7. Do not use creams, lotions, or ointments such as neosporin on your incisions after surgery until advised by your surgeon because they can cause removal of the dermabond glue on your incisions.     8. You may experience vaginal spotting after surgery or around the 6-8 week mark from surgery when the stitches at the top of the vagina begin to dissolve (if you have a hysterectomy).  The spotting is normal but if you experience heavy bleeding, call our office.   9. Take Tylenol first for pain if you are able to take these medication and only use narcotic pain medication for severe pain not relieved by the Tylenol.   Monitor your Tylenol intake to a max of 4,000 mg in a 24 hour period.    Diet: 1. Low sodium Heart Healthy Diet  is recommended but you are cleared to resume your normal (before surgery) diet after your procedure.   2. It is safe to use a laxative, such as Miralax or Colace, if you have difficulty moving your bowels. You have been prescribed Sennakot-S to take at bedtime every evening after surgery to keep bowel movements regular and to prevent constipation.     Wound Care: 1. Keep clean and dry.  Shower daily.   Reasons to call the Doctor: Fever - Oral temperature greater than 100.4 degrees Fahrenheit Foul-smelling vaginal discharge Difficulty urinating Nausea and vomiting Increased pain at the site of the incision that is unrelieved with pain medicine. Difficulty breathing with or without chest pain New calf pain especially if only on one side Sudden, continuing increased vaginal bleeding with or without clots.   Contacts: For questions or concerns you should contact:   Dr. Jeral Pinch at [phone]   Joylene John, NP at [phone]   After Hours: call [phone] and have the GYN Oncologist paged/contacted (after 5 pm or on the weekends).   Messages sent via mychart are for non-urgent matters and are not responded to after hours so for urgent needs, please call the after hours number.

## 2021-10-26 NOTE — Progress Notes (Signed)
DUE TO COVID-19 ONLY ONE VISITOR IS ALLOWED TO COME WITH YOU AND STAY IN THE WAITING ROOM ONLY DURING PRE OP AND PROCEDURE DAY OF SURGERY.  2 VISITOR  MAY VISIT WITH YOU AFTER SURGERY IN YOUR PRIVATE ROOM DURING VISITING HOURS ONLY! YOU MAY HAVE ONE PERSON SPEND THE NITE WITH YOU IN YOUR ROOM AFTER SURGERY.     Your procedure is scheduled on:          11/03/2021   Report to [facility] Main  Entrance   Report to admitting at  1245pm               DO NOT BRING INSURANCE CARD, PICTURE ID OR WALLET DAY OF SURGERY.      Call this number if you have problems the morning of surgery [phone]   Eat a light diet the day before surgery.    REMEMBER: NO  SOLID FOODS , CANDY, GUM OR MINTS AFTER MIDNITE THE NITE BEFORE SURGERY .       Marland Kitchen CLEAR LIQUIDS UNTIL    1145am             DAY OF SURGERY.        CLEAR LIQUID DIET   Foods Allowed      WATER BLACK COFFEE ( SUGAR OK, NO MILK, CREAM OR CREAMER) REGULAR AND DECAF  TEA ( SUGAR OK NO MILK, CREAM, OR CREAMER) REGULAR AND DECAF  PLAIN JELLO ( NO RED)  FRUIT ICES ( NO RED, NO FRUIT PULP)  POPSICLES ( NO RED)  JUICE- APPLE, WHITE GRAPE AND WHITE CRANBERRY  SPORT DRINK LIKE GATORADE ( NO RED)                                                                    BRUSH YOUR TEETH MORNING OF SURGERY AND RINSE YOUR MOUTH OUT, NO CHEWING GUM CANDY OR MINTS.     Take these medicines the morning of surgery with A SIP OF WATER:  amlodipine, toprol , eye drops    DO NOT TAKE ANY DIABETIC MEDICATIONS DAY OF YOUR SURGERY                               You may not have any metal on your body including hair pins and              piercings  Do not wear jewelry, make-up, lotions, powders or perfumes, deodorant             Do not wear nail polish on your fingernails.              IF YOU ARE A FEMALE AND WANT TO SHAVE UNDER ARMS OR LEGS PRIOR TO SURGERY YOU MUST DO SO AT LEAST 48 HOURS PRIOR TO SURGERY.              Men may shave face and  neck.   Do not bring valuables to the hospital. [place].  Contacts, dentures or bridgework may not be worn into surgery.  Leave suitcase in the car. After surgery it may be  brought to your room.     Patients discharged the day of surgery will not be allowed to drive home. IF YOU ARE HAVING SURGERY AND GOING HOME THE SAME DAY, YOU MUST HAVE AN ADULT TO DRIVE YOU HOME AND BE WITH YOU FOR 24 HOURS. YOU MAY GO HOME BY TAXI OR UBER OR ORTHERWISE, BUT AN ADULT MUST ACCOMPANY YOU HOME AND STAY WITH YOU FOR 24 HOURS.                Please read over the following fact sheets you were given: _____________________________________________________________________  [facility] - Preparing for Surgery Before surgery, you can play an important role.  Because skin is not sterile, your skin needs to be as free of germs as possible.  You can reduce the number of germs on your skin by washing with CHG (chlorahexidine gluconate) soap before surgery.  CHG is an antiseptic cleaner which kills germs and bonds with the skin to continue killing germs even after washing. Please DO NOT use if you have an allergy to CHG or antibacterial soaps.  If your skin becomes reddened/irritated stop using the CHG and inform your nurse when you arrive at Short Stay. Do not shave (including legs and underarms) for at least 48 hours prior to the first CHG shower.  You may shave your face/neck. Please follow these instructions carefully:  1.  Shower with CHG Soap the night before surgery and the  morning of Surgery.  2.  If you choose to wash your hair, wash your hair first as usual with your  normal  shampoo.  3.  After you shampoo, rinse your hair and body thoroughly to remove the  shampoo.                           4.  Use CHG as you would any other liquid soap.  You can apply chg directly  to the skin and wash                       Gently with a scrungie or clean washcloth.  5.   Apply the CHG Soap to your body ONLY FROM THE NECK DOWN.   Do not use on face/ open                           Wound or open sores. Avoid contact with eyes, ears mouth and genitals (private parts).                       Wash face,  Genitals (private parts) with your normal soap.             6.  Wash thoroughly, paying special attention to the area where your surgery  will be performed.  7.  Thoroughly rinse your body with warm water from the neck down.  8.  DO NOT shower/wash with your normal soap after using and rinsing off  the CHG Soap.                9.  Pat yourself dry with a clean towel.            10.  Wear clean pajamas.            11.  Place clean sheets on your bed the night of your first shower and do not  sleep with pets. Day of  Surgery : Do not apply any lotions/deodorants the morning of surgery.  Please wear clean clothes to the hospital/surgery center.  FAILURE TO FOLLOW THESE INSTRUCTIONS MAY RESULT IN THE CANCELLATION OF YOUR SURGERY PATIENT SIGNATURE_________________________________  NURSE SIGNATURE__________________________________  ________________________________________________________________________

## 2021-10-27 ENCOUNTER — Other Ambulatory Visit: Payer: Self-pay

## 2021-10-27 ENCOUNTER — Encounter (HOSPITAL_COMMUNITY)
Admission: RE | Admit: 2021-10-27 | Discharge: 2021-10-27 | Disposition: A | Discharge status: Home / Self Care | Payer: Medicare Other | Source: Ambulatory Visit | Attending: Gynecologic Oncology | Admitting: Gynecologic Oncology

## 2021-10-27 ENCOUNTER — Telehealth: Payer: Self-pay

## 2021-10-27 ENCOUNTER — Encounter (HOSPITAL_COMMUNITY): Payer: Self-pay

## 2021-10-27 ENCOUNTER — Telehealth: Payer: Self-pay | Admitting: *Deleted

## 2021-10-27 DIAGNOSIS — Z01812 Encounter for preprocedural laboratory examination: Secondary | ICD-10-CM | POA: Insufficient documentation

## 2021-10-27 DIAGNOSIS — R1900 Intra-abdominal and pelvic swelling, mass and lump, unspecified site: Secondary | ICD-10-CM | POA: Diagnosis not present

## 2021-10-27 DIAGNOSIS — R188 Other ascites: Secondary | ICD-10-CM | POA: Diagnosis not present

## 2021-10-27 HISTORY — DX: Essential (primary) hypertension: I10

## 2021-10-27 NOTE — Telephone Encounter (Signed)
Per Dr Berline Lopes fax office note and surgical optimization form to the patient's PCP and cardiology office

## 2021-10-27 NOTE — Telephone Encounter (Signed)
Spoke with Regina James and reviewed her medication list. Patient reports she does not take plavix. Advised patient we are waiting on surgical recommendations for her aspirin. Once our office receives this someone will call her to discuss recommendations/guidance. Patient verbalized understanding.

## 2021-10-27 NOTE — Telephone Encounter (Signed)
LVM for pt to call office regarding updating medication list, specifically to let us know if she is taking Plavix.

## 2021-10-28 LAB — CEA: CEA: 4.6 ng/mL (ref 0.0–4.7)

## 2021-10-28 LAB — CA 125: Cancer Antigen (CA) 125: 180 U/mL — ABNORMAL HIGH (ref 0.0–38.1)

## 2021-10-29 ENCOUNTER — Telehealth: Payer: Self-pay | Admitting: Internal Medicine

## 2021-10-29 ENCOUNTER — Telehealth: Payer: Self-pay

## 2021-10-29 NOTE — Telephone Encounter (Signed)
Caller is concerned they may need to administer heparin.  Caller is looking for a recommendation on the dosage to administer if needed.

## 2021-10-29 NOTE — Telephone Encounter (Signed)
Pt has been scheduled to see Melina Copa, PA-C, 11/02/21, clearance will be addressed at that time.  Will route to the requesting surgeon's office to make them aware.

## 2021-10-29 NOTE — Telephone Encounter (Signed)
   Name: GUENEVERE ROORDA  DOB: 19-May-1933  MRN: [id number]  Primary Cardiologist: None  Chart reviewed as part of pre-operative protocol coverage. Because of Yohana Bartha Mascari's past medical history and time since last visit, she will require a follow-up in-office visit in order to better assess preoperative cardiovascular risk.  Patient has not been seen in clinic since 2021.  Pre-op covering staff: - Please schedule appointment and call patient to inform them. If patient already had an upcoming appointment within acceptable timeframe, please add "pre-op clearance" to the appointment notes so provider is aware. - Please contact requesting surgeon's office via preferred method (i.e, phone, fax) to inform them of need for appointment prior to surgery.  Request states "hold heparin."  I do not see this medication on patient's current medication list.  Lenna Sciara, NP  10/29/2021, 12:30 PM

## 2021-10-29 NOTE — Telephone Encounter (Signed)
Spoke with patients daughter [name] regarding her moms ASA. Per Dr. Berline Lopes patient needs to stop taking her ASA now, we will let her know when she can resume this after surgery. [name] verbalized understanding and did not voice any questions. [name] is going to tell the patient to stop taking her ASA.   [name] is not sure of who manages her moms ASA typically or who managed her stroke.

## 2021-10-29 NOTE — Telephone Encounter (Signed)
    Pre-operative Risk Assessment    Patient Name: Regina James  DOB: 1933-08-17 MRN: [id number]      Request for Surgical Clearance    Procedure:   diagnostic laparoscopic possible biopsy, possible robotic assisted laparoscopic vs open bilateral salpingo oophorectomy, possible total hysterectomy, possible cancer staging   Date of Surgery:  Clearance 11/03/21                                 Surgeon:  Dr. Jeral Pinch  Surgeon's Group or Practice Name:  GYN oncology Phone number:  [phone] Fax number:  [phone]   Type of Clearance Requested:   - Medical  - Pharmacy:  Hold Heparin   pre and post surgery recommendation    Type of Anesthesia:  General    Additional requests/questions:    Signed, Selinda Orion   10/29/2021, 11:39 AM

## 2021-11-01 ENCOUNTER — Encounter: Payer: Self-pay | Admitting: Physician Assistant

## 2021-11-01 NOTE — Progress Notes (Unsigned)
Cardiology Office Note    Date:  11/02/2021   ID:  Regina James, DOB [birth year], MRN [id number]  PCP:  Kathyrn Lass, MD  Cardiologist:  Pixie Casino, MD  Electrophysiologist:  None   Chief Complaint: pre-operative evaluation for surgery tomorrow  History of Present Illness:   Regina James is a 88 y.o. female with history of remote PSVT, stroke s/p prior ILR (now defunct), brief PAF 2018, carotid artery disease s/p L carotid endarterectomy in 2018 and left subclavian stenosis previously followed by vascular surgery, ascending aortic aneurysm with penetrating ulcer previously followed by cardiothoracic surgery, mild-moderate mitral regurgitation, atrial septal aneurysm, HTN, and hyperlipidemia who is seen for pre-op clearance.   Prior cardiac studies reviewed. She has no history of CAD, MI, PCI or CABG. She last saw Dr. Debara Pickett in 2021. She had a loop recorder in place following her stroke. This was largely unrevealing except brief 0.1% Afib noted back in 2018 felt due to surgery and prolonged intubation, not recommended for anticoagulation at that time. Subsequent interrogations were negative and this stopped transmitting in 2021. She has not elected for removal. Last echo 2018 showed normal LV function, dilated ascending aorta, trace AI, mild-moderate MR, atrial septal aneurysm, negative saline microcavitation study.  Regarding carotid/subclavian disease, she was following with vascular surgery for this. Last carotid duplex in 2021 showed 1-49% RICA, no recurrent plaque on the left, with antegrade vertebral flow. Vascular surgery's last note in 2019 also indicates history of 80% left subclavian stenosis per vascular notes with abnormal BP gradient between left and right. Therefore her blood pressure should be checked in the right arm as the left arm is inaccurate. She states she does not want to see Dr. Oneida Alar again. She also has a history of thoracic aortic aneurysm. MRA in 2021 showed  4.7cm ascending TAA with 1.5cm penetrating ulcer at the level of the aortic arch. She was referred to CVTS. Repeat CT 05/2020 showed this was 4.8cm in size. She saw Dr. Orvan Seen with cardiothoracic surgery after that CT who recommended f/u with BP readings in 1 month but do not see this occurred. The patient indicates she elected with conservative management as she did not want to pursue any sort of surgical intervention in the future if it came to that.  She presents to clinic today with her daughter. She is pending surgery for pelvic mass including diagnostic biopsy with possible lap vs open salpingoopheretomy/total hysterectomy. She was seen in the ER earlier this month for near-syncope and hypotension in the setting of increased pelvic pain, found to have BPs in the 90s per notes. Her troponins were negative and she was felt stable for discharge with outpatient GYN follow-up. No medicine changes were made at that time. She has felt well without any recurrent lightheadedness since then and is very eager to proceed with her surgery. She denies any recent CP, palpitations, SOB, orthopnea, or syncope. She lives in a 2 story townhome and walks up and down without any angina or dyspnea. She also participates in ADLs and housework such as sweeping without any cardiac limitations.  Labwork independently reviewed: 10/2021 K 3.9, Cr 0.97, AST/ALT OK, WBC 12, Hgb 15.7, plt 507, trop neg x2 [facility] PCP 02/2021 Tchol 148, HDL 40, LDL 67, trig 254 2021 Lipid panel LDL 83, trig 243 2020 TSH wnl [facility]  Cardiology Studies:   Studies reviewed are outlined and summarized above. Reports included below if pertinent.   2D echo 10/2016  - Left ventricle:  Hypertrophy was noted. Systolic function was    normal. The estimated ejection fraction was in the range of 55%    to 60%. Wall motion was normal; there were no regional wall    motion abnormalities.  - Aortic valve: No evidence of vegetation. There was trivial     regurgitation.  - Aortic root: The aortic root was mildly dilated.  - Mitral valve: No evidence of vegetation. There was mild to    moderate regurgitation.  - Left atrium: The atrium was mildly dilated. No evidence of    thrombus in the atrial cavity or appendage.  - Right atrium: No evidence of thrombus in the atrial cavity or    appendage.  - Atrial septum: No defect or patent foramen ovale was identified.    There was an atrial septal aneurysm.  - Tricuspid valve: No evidence of vegetation.  - Pulmonic valve: No evidence of vegetation.   Impressions:   - Normal LV function; LVH; mildly dilated ascending aorta; trace    AI; mild to moderate MR; mild TR; atrial septal aneurysm;    negative saline microcavitation study.     Past Medical History:  Diagnosis Date   Atrial septal aneurysm    Cancer ([place])    basal cell carcinoma   Carotid artery disease ([place])    History of PSVT (paroxysmal supraventricular tachycardia)    Reported back as far as 2010.   Hyperlipidemia    Hypertension    Mitral regurgitation    Palpitations    Penetrating ulcer of aorta (HCC)    Stroke ([place])    x 3 cannot write as quickly as used to   Thoracic aortic aneurysm [facility])     Past Surgical History:  Procedure Laterality Date   APPENDECTOMY  1993   CERVICAL BIOPSY  W/ LOOP ELECTRODE EXCISION     CHOLECYSTECTOMY  1996   ENDARTERECTOMY Left 11/03/2016   Procedure: Exploration of Carotid  ENDARTERECTOMY.;  Surgeon: Elam Dutch, MD;  Location: [facility] OR;  Service: Vascular;  Laterality: Left;   ENDARTERECTOMY Left 11/03/2016   Procedure: ENDARTERECTOMY CAROTID;  Surgeon: Elam Dutch, MD;  Location: [facility];  Service: Vascular;  Laterality: Left;   LOOP RECORDER INSERTION N/A 11/02/2016   Procedure: Loop Recorder Insertion;  Surgeon: [name], MD;  Location: [facility];  Service: Cardiovascular;  Laterality: N/A;   ROTATOR CUFF REPAIR Right 1990   TEE WITHOUT CARDIOVERSION N/A  11/02/2016   Procedure: TRANSESOPHAGEAL ECHOCARDIOGRAM (TEE);  Surgeon: Lelon Perla, MD;  Location: [facility] ENDOSCOPY;  Service: Cardiovascular;  Laterality: N/A;   US ECHOCARDIOGRAPHY  01/09/2009   EF 55-60%   WRIST FRACTURE SURGERY      Current Medications: Current Meds  Medication Sig   aspirin 325 MG tablet Take 1 tablet (325 mg total) by mouth daily.   cholecalciferol (VITAMIN D3) 25 MCG (1000 UNIT) tablet Take 1,000 Units by mouth daily.   doxylamine, Sleep, (SLEEP AID) 25 MG tablet Take 25 mg by mouth at bedtime as needed for sleep.   latanoprost (XALATAN) 0.005 % ophthalmic solution Place 1 drop into the left eye at bedtime.   metoprolol succinate (TOPROL-XL) 100 MG 24 hr tablet Take 1 tablet (100 mg total) by mouth daily. Schedule an appointment for further refills, 2nd attempt (Patient taking differently: Take 100 mg by mouth daily. Schedule an appointment for further refills, 2nd attempt Patient reports she takes 1/2 tablet - '50mg'$  per day)   naproxen sodium (ALEVE) 220 MG  tablet Take 220 mg by mouth daily as needed (headache).   ondansetron (ZOFRAN) 4 MG tablet Take 1 tablet (4 mg total) by mouth every 8 (eight) hours as needed for nausea or vomiting.   oxyCODONE (OXY IR/ROXICODONE) 5 MG immediate release tablet Take 1 tablet (5 mg total) by mouth every 4 (four) hours as needed for severe pain. For AFTER surgery only, do not take and drive   Polyethyl Glycol-Propyl Glycol (SYSTANE) 0.4-0.3 % SOLN Place 1 drop into both eyes daily as needed for dry eyes.   pravastatin (PRAVACHOL) 20 MG tablet TAKE 1 TABLET AT 6PM. (Patient taking differently: Take 20 mg by mouth daily.)   senna-docusate (SENOKOT-S) 8.6-50 MG tablet Take 2 tablets by mouth at bedtime. For AFTER surgery, do not take if having diarrhea   timolol (TIMOPTIC) 0.5 % ophthalmic solution Place 1 drop into the left eye 2 (two) times daily.      Allergies:   Losartan, Dorzolamide hcl-timolol mal, Morphine and related, and  Levofloxacin   Social History   Socioeconomic History   Marital status: Widowed    Spouse name: Wouter   Number of children: 3   Years of education: 14   Highest education level: Not on file  Occupational History    Comment: retired, travel agent  Tobacco Use   Smoking status: Former    Packs/day: 0.25    Years: 45.00    Total pack years: 11.25    Types: Cigarettes    Quit date: 06/10/2016    Years since quitting: 5.4   Smokeless tobacco: Never   Tobacco comments:    Occasionally tobacco use - 1-2 cig a week  Vaping Use   Vaping Use: Never used  Substance and Sexual Activity   Alcohol use: Yes    Alcohol/week: 7.0 standard drinks of alcohol    Types: 7 Glasses of wine per week    Comment: glass of wine 3-4 days per wek   Drug use: No   Sexual activity: Not on file  Other Topics Concern   Not on file  Social History Narrative   Lives at home w/husband   Caffeine- coffee, 2 cups daily   Social Determinants of Health   Financial Resource Strain: Not on file  Food Insecurity: Not on file  Transportation Needs: Not on file  Physical Activity: Not on file  Stress: Not on file  Social Connections: Not on file     Family History:  The patient's family history includes Arrhythmia in her brother; Cancer in her father; Cardiomyopathy in her mother; Diabetes in her father; Kidney cancer in her brother; Varicose Veins in her mother. There is no history of Colon cancer, Breast cancer, Ovarian cancer, Endometrial cancer, or Pancreatic cancer.  ROS:   Please see the history of present illness.  All other systems are reviewed and otherwise negative.    EKG(s)/Additional Labs   EKG:  EKG is ordered today, personally reviewed, demonstrating NSR 70bpm, left axis deviation, one PVC, nonspecific STTW changes similar to prior.  Recent Labs: 10/22/2021: ALT 15; BUN 19; Creatinine, Ser 0.97; Hemoglobin 15.7; Platelets 507; Potassium 3.9; Sodium 140  Recent Lipid Panel    Component  Value Date/Time   CHOL 147 11/01/2016 0341   TRIG 71 11/04/2016 0358   HDL 42 11/01/2016 0341   CHOLHDL 3.5 11/01/2016 0341   VLDL 16 11/01/2016 0341   LDLCALC 89 11/01/2016 0341    PHYSICAL EXAM:    VS:  BP 98/62   Pulse 70  Ht 5' 6.5" (1.689 m)   Wt 164 lb 3.2 oz (74.5 kg)   SpO2 96%   BMI 26.11 kg/m   BMI: Body mass index is 26.11 kg/m.  GEN: Well nourished, well developed female in no acute distress HEENT: normocephalic, atraumatic Neck: no JVD, carotid bruits, or masses. + Left subclavian bruit Cardiac: RRR; no murmurs, rubs, or gallops, no edema  Respiratory:  clear to auscultation bilaterally, normal work of breathing GI: soft, nontender, nondistended, + BS MS: no deformity or atrophy Skin: warm and dry, no rash Neuro:  Alert and Oriented x 3, Strength and sensation are intact, follows commands Psych: euthymic mood, full affect  Wt Readings from Last 3 Encounters:  11/02/21 164 lb 3.2 oz (74.5 kg)  10/27/21 162 lb (73.5 kg)  10/26/21 163 lb (73.9 kg)     ASSESSMENT & PLAN:   1. Pre-op cardiovascular evaluation - the patient has minimal cardiac history by way of ischemia or heart failure - no hx of CAD, CABG, PCI, or LV dysfunction.  However, she has a significant vascular history previously followed in other specialties (vascular surgery and cardiothoracic surgery). Her RCRI is at least 6.6% based on history of stroke and nature of surgery. I discussed her case with Dr. Tamala Julian (DOD) as she has multiple comorbidities that increase her risk of surgery regardless that the patient has not had recent follow-up on and surgery is tomorrow. She has a known enlarging thoracic aortic aneurysm for which she has not wanted to pursue any additional imaging. She also has known carotid and subclavian disease that was last assessed and felt to be stable by imaging in 2021. Dr. Tamala Julian had felt that if she was symptomatically stable from a cardiac standpoint without any accelerating  anginal symptoms or CHF decompensation that it would be reasonable for her to proceed with surgery without further cardiac testing under the understanding that she is at high risk for complications at baseline given age and comorbidities. I reviewed this with the patient and she would like to proceed with surgery. The patient reports she has been having a lot of pain related to her pelvic mass and the procedure has been delayed many times; she does not want any further delays. I would recommend close hemodynamic monitoring during surgery as well as telemetry monitoring given history of arrhythmias. She has also been having issues with intermittent soft BP recently therefore will stop amlodipine altogether. This was first observed in her ER visit 10/22/21 when she was having worsening pelvic pain. At that time her Hgb/plt were elevated and Cr was normal so this did not seem specifically related to any anemia or AKI. We'll still plan to get a f/u echocardiogram to reassess her valvular disease after her surgery. Would continue metoprolol as tolerated perioperatively. She is already holding her ASA per her report (on this for her hx of stroke/vascular disease). Would avoid checking BP on left arm due to her known subclavian stenosis - this will be falsely low. I will fax a copy of this note to requesting surgeon.   2. Mitral regurgitation - no symptoms to suggest worsening heart failure. No significant murmur on exam. Will pursue echocardiogram to follow this up, last mild-moderate in 2018.  3. PAD with carotid artery disease s/p L CEA and prior stroke,  also reported left subclavian stenosis - there is a definite BP differential between R and L arms of nearly 20-34mHg. However, she has no symptoms of subclavian steal at this time. She is  not interested in going back to see her vascular surgeon. Per our discussion, will obtain f/u carotid duplex in our office after her surgery. Avoid checking BP on left arm as it  will be falsely low. The right arm gives a more accurate reading.  4. Ascending TAA with penetrating ulcer - the patient has elected for conservative approach and wants to table any further discussions of re-scanning at this time. Follow BP with cessation of amlodipine above (stopped due to tendency for soft BP).  5. PSVT, brief PAF (latter in 2018) - no recent recurrence of PSVT or PAF. She is not on oral anticoagulation as the PAF was said to have previously occurred in the context of intubation/surgery and MD recommended to defer at that time. She is at risk for peri-operative arrhythmias given her cardiac history so would make sure to keep her on a telemetry monitor to follow. Rare PVC noted. These were seen in 2018 was well. She is asymptomatic so will continue metoprolol as tolerated for now.  6. Atrial septal aneurysm - this is seen to correlate with patients who has history of stroke but is not necessary causative. Last echo 2018 had a negative microcavitation study. Continue to follow clinically.     Disposition: F/u with Dr. Debara Pickett as scheduled 01/2022.   Medication Adjustments/Labs and Tests Ordered: Current medicines are reviewed at length with the patient today.  Concerns regarding medicines are outlined above. Medication changes, Labs and Tests ordered today are summarized above and listed in the Patient Instructions accessible in Encounters.   Signed, Charlie Pitter, PA-C  11/02/2021 9:27 AM    [place] Phone: [phone]; Fax: [phone]

## 2021-11-02 ENCOUNTER — Ambulatory Visit (INDEPENDENT_AMBULATORY_CARE_PROVIDER_SITE_OTHER): Payer: Medicare Other | Admitting: Physician Assistant

## 2021-11-02 ENCOUNTER — Encounter: Payer: Self-pay | Admitting: Physician Assistant

## 2021-11-02 ENCOUNTER — Telehealth: Payer: Self-pay

## 2021-11-02 VITALS — BP 98/62 | HR 70 | Ht 66.5 in | Wt 164.2 lb

## 2021-11-02 DIAGNOSIS — I480 Paroxysmal atrial fibrillation: Secondary | ICD-10-CM | POA: Diagnosis not present

## 2021-11-02 DIAGNOSIS — Z01810 Encounter for preprocedural cardiovascular examination: Secondary | ICD-10-CM | POA: Diagnosis not present

## 2021-11-02 DIAGNOSIS — I739 Peripheral vascular disease, unspecified: Secondary | ICD-10-CM | POA: Diagnosis not present

## 2021-11-02 DIAGNOSIS — I253 Aneurysm of heart: Secondary | ICD-10-CM

## 2021-11-02 DIAGNOSIS — I7121 Aneurysm of the ascending aorta, without rupture: Secondary | ICD-10-CM

## 2021-11-02 DIAGNOSIS — I471 Supraventricular tachycardia: Secondary | ICD-10-CM

## 2021-11-02 DIAGNOSIS — I340 Nonrheumatic mitral (valve) insufficiency: Secondary | ICD-10-CM | POA: Diagnosis not present

## 2021-11-02 NOTE — Patient Instructions (Signed)
Medication Instructions:  STOP Amlodipine  *If you need a refill on your cardiac medications before your next appointment, please call your pharmacy*   Lab Work: None Ordered   Testing/Procedures: Your physician has requested that you have an echocardiogram. Echocardiography is a painless test that uses sound waves to create images of your heart. It provides your doctor with information about the size and shape of your heart and how well your heart's chambers and valves are working. This procedure takes approximately one hour. There are no restrictions for this procedure.  Your physician has requested that you have a carotid duplex. This test is an ultrasound of the carotid arteries in your neck. It looks at blood flow through these arteries that supply the brain with blood. Allow one hour for this exam. There are no restrictions or special instructions.   Follow-Up: At [facility], you and your health needs are our priority.  As part of our continuing mission to provide you with exceptional heart care, we have created designated Provider Care Teams.  These Care Teams include your primary Cardiologist (physician) and Advanced Practice Providers (APPs -  Physician Assistants and Nurse Practitioners) who all work together to provide you with the care you need, when you need it.  We recommend signing up for the patient portal called "MyChart".  Sign up information is provided on this After Visit Summary.  MyChart is used to connect with patients for Virtual Visits (Telemedicine).  Patients are able to view lab/test results, encounter notes, upcoming appointments, etc.  Non-urgent messages can be sent to your provider as well.   To learn more about what you can do with MyChart, go to [email or web address].    Your next appointment:   Keep follow up as scheduled with Dr Debara Pickett  The format for your next appointment:   In Person  Provider:   Pixie Casino, MD     Other  Instructions [place] BE INACCURATE DUE TO SUBCLAVIAN STENOSIS.  Important Information About Sugar

## 2021-11-02 NOTE — Telephone Encounter (Signed)
Spoke with patients daughter [name]. Telephone call to check on pre-operative status.  Patient compliant with pre-operative instructions.  Reinforced nothing to eat after midnight. Clear liquids until 1130. Patient to arrive at 1215.  No questions or concerns voiced.  Instructed to call for any needs.

## 2021-11-03 ENCOUNTER — Ambulatory Visit (HOSPITAL_BASED_OUTPATIENT_CLINIC_OR_DEPARTMENT_OTHER): Payer: Medicare Other | Admitting: Physician Assistant

## 2021-11-03 ENCOUNTER — Other Ambulatory Visit: Payer: Self-pay

## 2021-11-03 ENCOUNTER — Encounter (HOSPITAL_COMMUNITY)
Admission: RE | Disposition: A | Discharge status: Home / Self Care | Payer: Self-pay | Source: Ambulatory Visit | Attending: Gynecologic Oncology

## 2021-11-03 ENCOUNTER — Ambulatory Visit (HOSPITAL_COMMUNITY)
Admission: RE | Admit: 2021-11-03 | Discharge: 2021-11-03 | Disposition: A | Discharge status: Home / Self Care | Payer: Medicare Other | Source: Ambulatory Visit | Attending: Gynecologic Oncology | Admitting: Gynecologic Oncology

## 2021-11-03 ENCOUNTER — Ambulatory Visit (HOSPITAL_COMMUNITY): Payer: Medicare Other | Admitting: Physician Assistant

## 2021-11-03 ENCOUNTER — Encounter (HOSPITAL_COMMUNITY): Payer: Self-pay | Admitting: Gynecologic Oncology

## 2021-11-03 DIAGNOSIS — R1900 Intra-abdominal and pelvic swelling, mass and lump, unspecified site: Secondary | ICD-10-CM

## 2021-11-03 DIAGNOSIS — C55 Malignant neoplasm of uterus, part unspecified: Secondary | ICD-10-CM | POA: Diagnosis not present

## 2021-11-03 DIAGNOSIS — C569 Malignant neoplasm of unspecified ovary: Secondary | ICD-10-CM | POA: Insufficient documentation

## 2021-11-03 DIAGNOSIS — C7989 Secondary malignant neoplasm of other specified sites: Secondary | ICD-10-CM

## 2021-11-03 DIAGNOSIS — C801 Malignant (primary) neoplasm, unspecified: Secondary | ICD-10-CM

## 2021-11-03 DIAGNOSIS — Q631 Lobulated, fused and horseshoe kidney: Secondary | ICD-10-CM | POA: Insufficient documentation

## 2021-11-03 DIAGNOSIS — I739 Peripheral vascular disease, unspecified: Secondary | ICD-10-CM | POA: Diagnosis not present

## 2021-11-03 DIAGNOSIS — C481 Malignant neoplasm of specified parts of peritoneum: Secondary | ICD-10-CM | POA: Diagnosis not present

## 2021-11-03 DIAGNOSIS — Z7902 Long term (current) use of antithrombotics/antiplatelets: Secondary | ICD-10-CM | POA: Diagnosis not present

## 2021-11-03 DIAGNOSIS — Z87891 Personal history of nicotine dependence: Secondary | ICD-10-CM | POA: Insufficient documentation

## 2021-11-03 DIAGNOSIS — I10 Essential (primary) hypertension: Secondary | ICD-10-CM | POA: Diagnosis not present

## 2021-11-03 DIAGNOSIS — R188 Other ascites: Secondary | ICD-10-CM

## 2021-11-03 DIAGNOSIS — R8769 Abnormal cytological findings in specimens from other female genital organs: Secondary | ICD-10-CM | POA: Diagnosis not present

## 2021-11-03 DIAGNOSIS — Z8673 Personal history of transient ischemic attack (TIA), and cerebral infarction without residual deficits: Secondary | ICD-10-CM | POA: Insufficient documentation

## 2021-11-03 DIAGNOSIS — Z780 Asymptomatic menopausal state: Secondary | ICD-10-CM | POA: Diagnosis not present

## 2021-11-03 HISTORY — PX: LAPAROSCOPY: SHX197

## 2021-11-03 LAB — TYPE AND SCREEN
ABO/RH(D): O POS
Antibody Screen: NEGATIVE

## 2021-11-03 LAB — CBC
HCT: 42.4 % (ref 36.0–46.0)
Hemoglobin: 13.9 g/dL (ref 12.0–15.0)
MCH: 31 pg (ref 26.0–34.0)
MCHC: 32.8 g/dL (ref 30.0–36.0)
MCV: 94.6 fL (ref 80.0–100.0)
Platelets: 519 10*3/uL — ABNORMAL HIGH (ref 150–400)
RBC: 4.48 MIL/uL (ref 3.87–5.11)
RDW: 15.7 % — ABNORMAL HIGH (ref 11.5–15.5)
WBC: 10.5 10*3/uL (ref 4.0–10.5)
nRBC: 0 % (ref 0.0–0.2)

## 2021-11-03 LAB — COMPREHENSIVE METABOLIC PANEL
ALT: 15 U/L (ref 0–44)
AST: 24 U/L (ref 15–41)
Albumin: 3.4 g/dL — ABNORMAL LOW (ref 3.5–5.0)
Alkaline Phosphatase: 85 U/L (ref 38–126)
Anion gap: 10 (ref 5–15)
BUN: 14 mg/dL (ref 8–23)
CO2: 22 mmol/L (ref 22–32)
Calcium: 8.6 mg/dL — ABNORMAL LOW (ref 8.9–10.3)
Chloride: 107 mmol/L (ref 98–111)
Creatinine, Ser: 0.74 mg/dL (ref 0.44–1.00)
GFR, Estimated: >60 mL/min (ref >60)
Glucose, Bld: 100 mg/dL — ABNORMAL HIGH (ref 70–99)
Potassium: 4.3 mmol/L (ref 3.5–5.1)
Sodium: 139 mmol/L (ref 135–145)
Total Bilirubin: 1 mg/dL (ref 0.3–1.2)
Total Protein: 6.6 g/dL (ref 6.5–8.1)

## 2021-11-03 LAB — ABO/RH: ABO/RH(D): O POS

## 2021-11-03 SURGERY — LAPAROSCOPY, DIAGNOSTIC
Anesthesia: General

## 2021-11-03 MED ORDER — LIDOCAINE HCL (PF) 2 % IJ SOLN
INTRAMUSCULAR | Status: AC
  Filled 2021-11-03: qty 5

## 2021-11-03 MED ORDER — FENTANYL CITRATE (PF) 100 MCG/2ML IJ SOLN
INTRAMUSCULAR | Status: AC
  Filled 2021-11-03: qty 2

## 2021-11-03 MED ORDER — ONDANSETRON HCL 4 MG/2ML IJ SOLN
INTRAMUSCULAR | Status: AC
Start: 2021-11-03 — End: ?
  Filled 2021-11-03: qty 2

## 2021-11-03 MED ORDER — DEXAMETHASONE SODIUM PHOSPHATE 10 MG/ML IJ SOLN
INTRAMUSCULAR | Status: AC
  Filled 2021-11-03: qty 1

## 2021-11-03 MED ORDER — STERILE WATER FOR IRRIGATION IR SOLN
Status: DC | PRN
  Administered 2021-11-03: 1000 mL

## 2021-11-03 MED ORDER — BUPIVACAINE HCL (PF) 0.25 % IJ SOLN
INTRAMUSCULAR | Status: DC | PRN
  Administered 2021-11-03: 24 mL

## 2021-11-03 MED ORDER — LIDOCAINE 2% (20 MG/ML) 5 ML SYRINGE
INTRAMUSCULAR | Status: DC | PRN
  Administered 2021-11-03: 1.5 mg/kg/h via INTRAVENOUS
  Administered 2021-11-03: 60 mg via INTRAVENOUS

## 2021-11-03 MED ORDER — AMISULPRIDE (ANTIEMETIC) 5 MG/2ML IV SOLN: 10.0000 mg | Freq: Once | INTRAVENOUS | Status: DC | PRN

## 2021-11-03 MED ORDER — ROCURONIUM BROMIDE 10 MG/ML (PF) SYRINGE
PREFILLED_SYRINGE | INTRAVENOUS | Status: AC
  Filled 2021-11-03: qty 10

## 2021-11-03 MED ORDER — PHENYLEPHRINE 80 MCG/ML (10ML) SYRINGE FOR IV PUSH (FOR BLOOD PRESSURE SUPPORT)
PREFILLED_SYRINGE | INTRAVENOUS | Status: DC | PRN
  Administered 2021-11-03 (×3): 80 ug via INTRAVENOUS

## 2021-11-03 MED ORDER — ONDANSETRON HCL 4 MG/2ML IJ SOLN: 4.0000 mg | Freq: Once | INTRAMUSCULAR | Status: DC | PRN

## 2021-11-03 MED ORDER — POVIDONE-IODINE 10 % EX SWAB
2.0000 | Freq: Once | CUTANEOUS | Status: AC
  Administered 2021-11-03: 2 via TOPICAL

## 2021-11-03 MED ORDER — DEXAMETHASONE SODIUM PHOSPHATE 4 MG/ML IJ SOLN
4.0000 mg | INTRAMUSCULAR | Status: AC
  Administered 2021-11-03: 4 mg via INTRAVENOUS

## 2021-11-03 MED ORDER — PROPOFOL 10 MG/ML IV BOLUS
INTRAVENOUS | Status: DC | PRN
  Administered 2021-11-03: 100 mg via INTRAVENOUS

## 2021-11-03 MED ORDER — FENTANYL CITRATE PF 50 MCG/ML IJ SOSY: 25.0000 ug | PREFILLED_SYRINGE | INTRAMUSCULAR | Status: DC | PRN

## 2021-11-03 MED ORDER — ONDANSETRON HCL 4 MG/2ML IJ SOLN
INTRAMUSCULAR | Status: DC | PRN
  Administered 2021-11-03: 4 mg via INTRAVENOUS

## 2021-11-03 MED ORDER — SUGAMMADEX SODIUM 200 MG/2ML IV SOLN
INTRAVENOUS | Status: DC | PRN
  Administered 2021-11-03: 200 mg via INTRAVENOUS

## 2021-11-03 MED ORDER — ACETAMINOPHEN 500 MG PO TABS
1000.0000 mg | ORAL_TABLET | ORAL | Status: AC
  Administered 2021-11-03: 1000 mg via ORAL
  Filled 2021-11-03: qty 2

## 2021-11-03 MED ORDER — PROPOFOL 10 MG/ML IV BOLUS
INTRAVENOUS | Status: AC
  Filled 2021-11-03: qty 20

## 2021-11-03 MED ORDER — HEPARIN SODIUM (PORCINE) 5000 UNIT/ML IJ SOLN
5000.0000 [IU] | INTRAMUSCULAR | Status: AC
  Administered 2021-11-03: 5000 [IU] via SUBCUTANEOUS
  Filled 2021-11-03: qty 1

## 2021-11-03 MED ORDER — LACTATED RINGERS IV SOLN: INTRAVENOUS | Status: DC

## 2021-11-03 MED ORDER — FENTANYL CITRATE (PF) 100 MCG/2ML IJ SOLN
INTRAMUSCULAR | Status: DC | PRN
  Administered 2021-11-03 (×4): 50 ug via INTRAVENOUS

## 2021-11-03 MED ORDER — LACTATED RINGERS IR SOLN
Status: DC | PRN
  Administered 2021-11-03: 1

## 2021-11-03 MED ORDER — ROCURONIUM BROMIDE 10 MG/ML (PF) SYRINGE
PREFILLED_SYRINGE | INTRAVENOUS | Status: DC | PRN
  Administered 2021-11-03: 40 mg via INTRAVENOUS
  Administered 2021-11-03 (×2): 10 mg via INTRAVENOUS

## 2021-11-03 SURGICAL SUPPLY — 94 items
ADH SKN CLS APL DERMABOND .7 (GAUZE/BANDAGES/DRESSINGS) ×2
AGENT HMST KT MTR STRL THRMB (HEMOSTASIS)
APL ESCP 34 STRL LF DISP (HEMOSTASIS)
APL PRP STRL LF DISP 70% ISPRP (MISCELLANEOUS) ×2
APPLICATOR SURGIFLO ENDO (HEMOSTASIS) IMPLANT
BAG COUNTER SPONGE SURGICOUNT (BAG) IMPLANT
BAG LAPAROSCOPIC 12 15 PORT 16 (BASKET) IMPLANT
BAG RETRIEVAL 12/15 (BASKET)
BAG SPNG CNTER NS LX DISP (BAG)
BLADE SURG SZ10 CARB STEEL (BLADE) IMPLANT
CABLE HIGH FREQUENCY MONO STRZ (ELECTRODE) IMPLANT
CHLORAPREP W/TINT 26 (MISCELLANEOUS) ×4 IMPLANT
CNTNR URN SCR LID CUP LEK RST (MISCELLANEOUS) IMPLANT
CONT SPEC 4OZ STRL OR WHT (MISCELLANEOUS)
COVER BACK TABLE 60X90IN (DRAPES) ×4 IMPLANT
COVER SURGICAL LIGHT HANDLE (MISCELLANEOUS) ×4 IMPLANT
COVER TIP SHEARS 8 DVNC (MISCELLANEOUS) ×3 IMPLANT
COVER TIP SHEARS 8MM DA VINCI (MISCELLANEOUS) ×3
DERMABOND ADVANCED (GAUZE/BANDAGES/DRESSINGS) ×1
DERMABOND ADVANCED .7 DNX12 (GAUZE/BANDAGES/DRESSINGS) ×3 IMPLANT
DRAPE ARM DVNC X/XI (DISPOSABLE) ×12 IMPLANT
DRAPE COLUMN DVNC XI (DISPOSABLE) ×3 IMPLANT
DRAPE DA VINCI XI ARM (DISPOSABLE) ×12
DRAPE DA VINCI XI COLUMN (DISPOSABLE) ×3
DRAPE SHEET LG 3/4 BI-LAMINATE (DRAPES) ×4 IMPLANT
DRAPE SURG IRRIG POUCH 19X23 (DRAPES) ×4 IMPLANT
DRSG OPSITE POSTOP 4X6 (GAUZE/BANDAGES/DRESSINGS) IMPLANT
DRSG OPSITE POSTOP 4X8 (GAUZE/BANDAGES/DRESSINGS) IMPLANT
ELECT PENCIL ROCKER SW 15FT (MISCELLANEOUS) IMPLANT
ELECT REM PT RETURN 15FT ADLT (MISCELLANEOUS) ×4 IMPLANT
GAUZE 4X4 16PLY ~~LOC~~+RFID DBL (SPONGE) ×8 IMPLANT
GLOVE BIO SURGEON STRL SZ 6 (GLOVE) ×16 IMPLANT
GLOVE BIO SURGEON STRL SZ 6.5 (GLOVE) ×8 IMPLANT
GOWN STRL REUS W/ TWL LRG LVL3 (GOWN DISPOSABLE) ×12 IMPLANT
GOWN STRL REUS W/TWL LRG LVL3 (GOWN DISPOSABLE) ×12
HOLDER FOLEY CATH W/STRAP (MISCELLANEOUS) IMPLANT
IRRIG SUCT STRYKERFLOW 2 WTIP (MISCELLANEOUS) ×3
IRRIGATION SUCT STRKRFLW 2 WTP (MISCELLANEOUS) ×3 IMPLANT
KIT BASIN OR (CUSTOM PROCEDURE TRAY) ×4 IMPLANT
KIT PROCEDURE DA VINCI SI (MISCELLANEOUS)
KIT PROCEDURE DVNC SI (MISCELLANEOUS) IMPLANT
KIT TURNOVER KIT A (KITS) IMPLANT
LIGASURE IMPACT 36 18CM CVD LR (INSTRUMENTS) IMPLANT
MANIPULATOR ADVINCU DEL 3.0 PL (MISCELLANEOUS) IMPLANT
MANIPULATOR ADVINCU DEL 3.5 PL (MISCELLANEOUS) IMPLANT
MANIPULATOR UTERINE 4.5 ZUMI (MISCELLANEOUS) IMPLANT
NDL HYPO 21X1.5 SAFETY (NEEDLE) ×2 IMPLANT
NDL SPNL 18GX3.5 QUINCKE PK (NEEDLE) IMPLANT
NEEDLE HYPO 21X1.5 SAFETY (NEEDLE) ×3 IMPLANT
NEEDLE SPNL 18GX3.5 QUINCKE PK (NEEDLE) IMPLANT
OBTURATOR OPTICAL STANDARD 8MM (TROCAR) ×3
OBTURATOR OPTICAL STND 8 DVNC (TROCAR) ×2
OBTURATOR OPTICALSTD 8 DVNC (TROCAR) ×3 IMPLANT
PACK ROBOT GYN CUSTOM WL (TRAY / TRAY PROCEDURE) ×4 IMPLANT
PAD POSITIONING PINK XL (MISCELLANEOUS) ×4 IMPLANT
PORT ACCESS TROCAR AIRSEAL 12 (TROCAR) ×3 IMPLANT
PORT ACCESS TROCAR AIRSEAL 5M (TROCAR) ×1
SCISSORS LAP 5X35 DISP (ENDOMECHANICALS) IMPLANT
SCRUB CHG 4% DYNA-HEX 4OZ (MISCELLANEOUS) ×8 IMPLANT
SEAL CANN UNIV 5-8 DVNC XI (MISCELLANEOUS) ×12 IMPLANT
SEAL XI 5MM-8MM UNIVERSAL (MISCELLANEOUS) ×12
SEALER TISSUE G2 CVD JAW 45CM (ENDOMECHANICALS) IMPLANT
SET TRI-LUMEN FLTR TB AIRSEAL (TUBING) ×4 IMPLANT
SHEET LAVH (DRAPES) ×4 IMPLANT
SLEEVE Z-THREAD 5X100MM (TROCAR) ×4 IMPLANT
SPIKE FLUID TRANSFER (MISCELLANEOUS) ×4 IMPLANT
SPONGE T-LAP 18X18 ~~LOC~~+RFID (SPONGE) IMPLANT
SURGIFLO W/THROMBIN 8M KIT (HEMOSTASIS) IMPLANT
SUT MNCRL AB 4-0 PS2 18 (SUTURE) ×8 IMPLANT
SUT PDS AB 1 TP1 96 (SUTURE) IMPLANT
SUT VIC AB 0 CT1 27 (SUTURE)
SUT VIC AB 0 CT1 27XBRD ANTBC (SUTURE) IMPLANT
SUT VIC AB 2-0 CT1 27 (SUTURE)
SUT VIC AB 2-0 CT1 TAPERPNT 27 (SUTURE) IMPLANT
SUT VIC AB 4-0 PS2 18 (SUTURE) ×8 IMPLANT
SYR 10ML LL (SYRINGE) IMPLANT
SYS BAG RETRIEVAL 10MM (BASKET)
SYS RETRIEVAL 5MM INZII UNIV (BASKET)
SYS WOUND ALEXIS 18CM MED (MISCELLANEOUS)
SYSTEM BAG RETRIEVAL 10MM (BASKET) IMPLANT
SYSTEM RETRIEVL 5MM INZII UNIV (BASKET) IMPLANT
SYSTEM WOUND ALEXIS 18CM MED (MISCELLANEOUS) IMPLANT
TOWEL OR 17X26 10 PK STRL BLUE (TOWEL DISPOSABLE) ×4 IMPLANT
TOWEL OR NON WOVEN STRL DISP B (DISPOSABLE) ×4 IMPLANT
TRAP SPECIMEN MUCUS 40CC (MISCELLANEOUS) IMPLANT
TRAY FOLEY MTR SLVR 16FR STAT (SET/KITS/TRAYS/PACK) ×4 IMPLANT
TRAY LAPAROSCOPIC (CUSTOM PROCEDURE TRAY) ×4 IMPLANT
TROCAR ADV FIXATION 12X100MM (TROCAR) IMPLANT
TROCAR BALLN 12MMX100 BLUNT (TROCAR) IMPLANT
TROCAR Z-THREAD FIOS 5X100MM (TROCAR) IMPLANT
TROCAR Z-THREAD OPTICAL 5X100M (TROCAR) ×4 IMPLANT
UNDERPAD 30X36 HEAVY ABSORB (UNDERPADS AND DIAPERS) ×8 IMPLANT
WATER STERILE IRR 1000ML POUR (IV SOLUTION) ×4 IMPLANT
YANKAUER SUCT BULB TIP 10FT TU (MISCELLANEOUS) IMPLANT

## 2021-11-03 NOTE — Interval H&P Note (Signed)
History and Physical Interval Note:  11/03/2021 4:04 PM  Regina James  has presented today for surgery, with the diagnosis of PELVIC MASS.  The various methods of treatment have been discussed with the patient and family. After consideration of risks, benefits and other options for treatment, the patient has consented to  Procedure(s): LAPAROSCOPY DIAGNOSTIC;POSSIBLE BIOPSY (N/A) POSSIBLE XI ROBOTIC ASSISTED SALPINGO OOPHORECTOMY (Bilateral) POSSIBLE XI ROBOTIC ASSISTED TOTAL HYSTERECTOMY;POSSIBLE STAGING (N/A) as a surgical intervention.  The patient's history has been reviewed, patient examined, no change in status, stable for surgery.  I have reviewed the patient's chart and labs.  Questions were answered to the patient's satisfaction.     Lafonda Mosses

## 2021-11-03 NOTE — Discharge Instructions (Signed)
AFTER SURGERY INSTRUCTIONS   Return to work: 4-6 weeks if applicable   You may have a white honeycomb dressing over your larger incision. This dressing can be removed 5 days after surgery and you do not need to reapply a new dressing. Once you remove the dressing, you will notice that you have the surgical glue (dermabond) on the incision and this will peel off on its own. You can get this dressing wet in the shower the days after surgery prior to removal on the 5th day.    Activity: 1. Be up and out of the bed during the day.  Take a nap if needed.  You may walk up steps but be careful and use the hand rail.  Stair climbing will tire you more than you think, you may need to stop part way and rest.    2. No lifting or straining for 6 weeks over 10 pounds. No pushing, pulling, straining for 6 weeks.   3. No driving for around 1 week(s) if you were cleared to drive before surgery.  Do not drive if you are taking narcotic pain medicine and make sure that your reaction time has returned.    4. You can shower as soon as the next day after surgery. Shower daily.  Use your regular soap and water (not directly on the incision) and pat your incision(s) dry afterwards; don't rub.  No tub baths or submerging your body in water until cleared by your surgeon. If you have the soap that was given to you by pre-surgical testing that was used before surgery, you do not need to use it afterwards because this can irritate your incisions.    5. No sexual activity and nothing in the vagina for 4 weeks, 8 weeks if you have a hysterectomy.   6. You may experience a small amount of clear drainage from your incisions, which is normal.  If the drainage persists, increases, or changes color please call the office.   7. Do not use creams, lotions, or ointments such as neosporin on your incisions after surgery until advised by your surgeon because they can cause removal of the dermabond glue on your incisions.     8. You  may experience vaginal spotting after surgery or around the 6-8 week mark from surgery when the stitches at the top of the vagina begin to dissolve (if you have a hysterectomy).  The spotting is normal but if you experience heavy bleeding, call our office.   9. Take Tylenol first for pain if you are able to take these medication and only use narcotic pain medication for severe pain not relieved by the Tylenol.  Monitor your Tylenol intake to a max of 4,000 mg in a 24 hour period.    Diet: 1. Low sodium Heart Healthy Diet is recommended but you are cleared to resume your normal (before surgery) diet after your procedure.   2. It is safe to use a laxative, such as Miralax or Colace, if you have difficulty moving your bowels. You have been prescribed Sennakot-S to take at bedtime every evening after surgery to keep bowel movements regular and to prevent constipation.     Wound Care: 1. Keep clean and dry.  Shower daily.   Reasons to call the Doctor: Fever - Oral temperature greater than 100.4 degrees Fahrenheit Foul-smelling vaginal discharge Difficulty urinating Nausea and vomiting Increased pain at the site of the incision that is unrelieved with pain medicine. Difficulty breathing with or without chest  pain New calf pain especially if only on one side Sudden, continuing increased vaginal bleeding with or without clots.   Contacts: For questions or concerns you should contact:   Dr. Jeral Pinch at [phone]   Joylene John, NP at [phone]   After Hours: call [phone] and have the GYN Oncologist paged/contacted (after 5 pm or on the weekends).   Messages sent via mychart are for non-urgent matters and are not responded to after hours so for urgent needs, please call the after hours number.

## 2021-11-03 NOTE — Anesthesia Preprocedure Evaluation (Addendum)
Anesthesia Evaluation  Patient identified by MRN, date of birth, ID band Patient awake    Reviewed: Allergy & Precautions, NPO status , Patient's Chart, lab work & pertinent test results  Airway Mallampati: II  TM Distance: >3 FB Neck ROM: Full    Dental  (+) Missing   Pulmonary former smoker,    Pulmonary exam normal        Cardiovascular hypertension, Pt. on medications and Pt. on home beta blockers + Peripheral Vascular Disease  Normal cardiovascular exam     Neuro/Psych CVA negative psych ROS   GI/Hepatic negative GI ROS, Neg liver ROS,   Endo/Other  negative endocrine ROS  Renal/GU negative Renal ROS     Musculoskeletal negative musculoskeletal ROS (+)   Abdominal   Peds  Hematology negative hematology ROS (+)   Anesthesia Other Findings PELVIC MASS  Reproductive/Obstetrics                            Anesthesia Physical Anesthesia Plan  ASA: 2  Anesthesia Plan: General   Post-op Pain Management:    Induction: Intravenous  PONV Risk Score and Plan: 3 and Ondansetron, Dexamethasone, Propofol infusion and Treatment may vary due to age or medical condition  Airway Management Planned: Oral ETT  Additional Equipment:   Intra-op Plan:   Post-operative Plan: Extubation in OR  Informed Consent: I have reviewed the patients History and Physical, chart, labs and discussed the procedure including the risks, benefits and alternatives for the proposed anesthesia with the patient or authorized representative who has indicated his/her understanding and acceptance.     Dental advisory given  Plan Discussed with: CRNA  Anesthesia Plan Comments:         Anesthesia Quick Evaluation

## 2021-11-03 NOTE — Anesthesia Procedure Notes (Signed)
Procedure Name: Intubation Date/Time: 11/03/2021 5:52 PM  Performed by: Montel Clock, CRNAPre-anesthesia Checklist: Patient identified, Emergency Drugs available, Suction available, Patient being monitored and Timeout performed Patient Re-evaluated:Patient Re-evaluated prior to induction Oxygen Delivery Method: Circle system utilized Preoxygenation: Pre-oxygenation with 100% oxygen Induction Type: IV induction Ventilation: Mask ventilation without difficulty Laryngoscope Size: Mac and 3 Grade View: Grade II Tube type: Oral Tube size: 7.0 mm Number of attempts: 1 Airway Equipment and Method: Stylet Placement Confirmation: ETT inserted through vocal cords under direct vision, positive ETCO2 and breath sounds checked- equal and bilateral Secured at: 21 cm Tube secured with: Tape Dental Injury: Teeth and Oropharynx as per pre-operative assessment

## 2021-11-03 NOTE — Transfer of Care (Signed)
Immediate Anesthesia Transfer of Care Note  Patient: Regina James  Procedure(s) Performed: DIAGNOSTIC LAPAROSCOPY WITH BIOPSY  Patient Location: PACU  Anesthesia Type:General  Level of Consciousness: sedated, patient cooperative and responds to stimulation  Airway & Oxygen Therapy: Patient Spontanous Breathing and Patient connected to face mask oxygen  Post-op Assessment: Report given to RN and Post -op Vital signs reviewed and stable  Post vital signs: Reviewed and stable  Last Vitals:  Vitals Value Taken Time  BP 151/91 11/03/21 1922  Temp    Pulse 71 11/03/21 1924  Resp 17 11/03/21 1924  SpO2 100 % 11/03/21 1924  Vitals shown include unvalidated device data.  Last Pain:  Vitals:   11/03/21 1306  TempSrc:   PainSc: 0-No pain         Complications: No notable events documented.

## 2021-11-03 NOTE — Op Note (Signed)
OPERATIVE NOTE  Pre-operative Diagnosis: Adnexal mass, elevated CA-125, GI symptoms concerning for carcinomatosis  Post-operative Diagnosis: Stage IIIB ovarian cancer  Operation: Diagnostic laparoscopy, peritoneal biopsies  Surgeon: Jeral Pinch MD  Assistant Surgeon: Lahoma Crocker MD (an MD assistant was necessary for tissue manipulation, management of robotic instrumentation, retraction and positioning due to the complexity of the case and hospital policies).   Anesthesia: GET  Urine Output: 50 cc  Operative Findings:  Small anterior uterus, large mass filling the cul-de-sac, nodular, minimally mobile.  On intra-abdominal entry, small volume green-tinged ascites.  Several less than 5 mm implants on the left liver edge.  Multiple 5 mm - 1 cm areas of carcinomatosis on the small bowel mesentery, omentum, and multiple loops of small bowel.  Multiple less than 1 cm areas of carcinomatosis on the cecum and within an adhesion between the cecum and the anterior abdominal wall.  Left ovary atrophic appearing.  Right ovary enlarged containing an approximately 20 cm cystic mass.  Uterus approximately 6 cm in anterior to the adnexal mass.  Areas of carcinomatosis along the uterine serosa.  Anterior cul-de-sac and peritoneal adhesion between the anterior abdominal wall and the bladder peritoneum with carcinomatosis.  Multiple biopsies along the anterior cul-de-sac peritoneum taken.  Frozen section of biopsies consistent with adenocarcinoma.  Estimated Blood Loss: 25 cc      Total IV Fluids: See I&O flowsheet         Specimens: Peritoneal biopsies         Complications:  None parent; patient tolerated the procedure well.         Disposition: PACU - hemodynamically stable.  Procedure Details  The patient was seen in the Holding Room. The risks, benefits, complications, treatment options, and expected outcomes were discussed with the patient.  The patient concurred with the proposed plan,  giving informed consent.  The site of surgery properly noted/marked. The patient was identified as Regina James and the procedure verified as a diagnostic laparoscopy with biopsies, possible open versus robotic bilateral salpingo-oophorectomy, total hysterectomy, possible staging.  After induction of anesthesia, the patient was draped and prepped in the usual sterile manner. Patient was placed in supine position after anesthesia and draped and prepped in the usual sterile manner as follows: Her arms were tucked to her side with all appropriate precautions.  The patient was secured to the bed with padding and tape across her chest.  The patient was placed in the semi-lithotomy position in [place].  The perineum and vagina were prepped with CholoraPrep. The patient was draped after the CholoraPrep had been allowed to dry for 3 minutes.  A Time Out was held and the above information confirmed.  The urethra was prepped with Betadine. Foley catheter was placed.  OG tube placement was confirmed and to suction.   Next, a 5 mm skin incision was made 1 cm below the subcostal margin in the midclavicular line.  The 5 mm Optiview port and scope was used for direct entry.  Opening pressure was under 10 mm CO2.  The abdomen was insufflated and the findings were noted as above.   At this point and all points during the procedure, the patient's intra-abdominal pressure did not exceed 15 mmHg. Next, a 5 mm incision was made above the umbilicus and another in the right mid abdomen.  All ports were placed under direct visualization.  The patient was placed in steep Trendelenburg.   Washings were collected.  Using scissors with monopolar electrocautery, multiple biopsies  were taken of the peritoneal implants.  These were sent for frozen section.  Upon confirmation of malignancy, additional biopsy was taken.  Pictures were taken of intra-abdominal findings.  The subcuticular tissue was closed with 4-0 Vicryl and the  skin was closed with 4-0 Monocryl in a subcuticular manner.  Dermabond was applied.    Foley catheter was removed.  All sponge, lap and needle counts were correct x  3.   The patient was transferred to the recovery room in stable condition.  Jeral Pinch, MD

## 2021-11-04 ENCOUNTER — Telehealth: Payer: Self-pay

## 2021-11-04 ENCOUNTER — Other Ambulatory Visit: Payer: Self-pay | Admitting: Gynecologic Oncology

## 2021-11-04 ENCOUNTER — Encounter (HOSPITAL_COMMUNITY): Payer: Self-pay | Admitting: Gynecologic Oncology

## 2021-11-04 ENCOUNTER — Telehealth: Payer: Self-pay | Admitting: Physician Assistant

## 2021-11-04 DIAGNOSIS — C561 Malignant neoplasm of right ovary: Secondary | ICD-10-CM

## 2021-11-04 NOTE — Anesthesia Postprocedure Evaluation (Signed)
Anesthesia Post Note  Patient: Regina James  Procedure(s) Performed: DIAGNOSTIC LAPAROSCOPY WITH BIOPSY     Patient location during evaluation: PACU Anesthesia Type: General Level of consciousness: awake Pain management: pain level controlled Vital Signs Assessment: post-procedure vital signs reviewed and stable Respiratory status: spontaneous breathing, nonlabored ventilation, respiratory function stable and patient connected to nasal cannula oxygen Cardiovascular status: blood pressure returned to baseline and stable Postop Assessment: no apparent nausea or vomiting Anesthetic complications: no   No notable events documented.  Last Vitals:  Vitals:   11/03/21 1945 11/03/21 2000  BP: (!) 151/82 (!) 153/80  Pulse: 69 63  Resp: 15 17  Temp:  36.5 C  SpO2: 93% 93%    Last Pain:  Vitals:   11/03/21 2000  TempSrc:   PainSc: 0-No pain                 Arlethia Basso P Rebbie Lauricella

## 2021-11-04 NOTE — Telephone Encounter (Signed)
I spoke with the patient and made her aware to stop Norvasc. She states she will speak with Dr Berline Lopes her oncologist and if he is agreeable then she will stop the medication.   Advised the patient that I would make Regina James aware. She voiced understanding.

## 2021-11-04 NOTE — Telephone Encounter (Signed)
Following up with patient regarding resuming her ASA. Per Dr. Berline Lopes advised patient she can resume her aspirin on Friday 11/06/21.  Patient states "I'm sorry, I forgot and was trying to get my medications straight and I took one earlier. I won't take anymore until Friday. I did get rid of my amlodipine "   Instructed patient to monitor for signs of bleeding and to notify our office if she has bleeding. Patient has both the office number and the after hours number.

## 2021-11-04 NOTE — Telephone Encounter (Signed)
Following up review of charts from Monday. I had asked Tee to make sure to remove amlodipine from med list during the visit and at checkout since we discontinued it but I think she must have had some trouble taking it off. We had reviewed with the patient and outlined on AVS to discontinue this medicine due to soft BP. It is still showing as listed in her med list on my end. Please call patient to make sure she still knew to hold this. Would have her follow her BP at home in right arm only and notify if getting any readings >130 on the top number going forward. We stopped her amlodipine due to soft BP at the office. Hopefully her surgery went well yesterday. Keep f/u as planned in October. Thanks.

## 2021-11-04 NOTE — Progress Notes (Signed)
Spoke with the patient's family after diagnostic surgery.  Reviewed pictures from surgery with them.  Plan for referral to Dr. Alvy Bimler for neoadjuvant chemotherapy.  Discussed plan for port insertion, order placed.  Jeral Pinch MD Gynecologic Oncology

## 2021-11-04 NOTE — Telephone Encounter (Signed)
Attempted to reach patient to check in with her post-operatively. Unable to reach patient. Left message requesting return call.

## 2021-11-04 NOTE — Telephone Encounter (Signed)
Spoke with Regina James this morning. She reports she is drinking well but has a reduced appetite. She is drinking 1-2 ensure protein shakes throughout the day and eating smaller snacks. She is urinating well, she wears depends and changes these 4-5 times throughout the day. She has not had a BM yet and is not passing gas. She is planning on taking senokot tonight and encouraged her to drink plenty of water. She denies fever or chills. Incisions are dry and intact. She rates her pain 0/10. She reports she has not needed to take any pain medication.  Patient reports she received a call from her cardiology office with instructions to discontinue her amlodipine and she is unsure of this. Provider notified, advised patient to follow cardiology recommendations of discontinuing amlodipine and to monitor her BP at home in the right arm only and notify cardiology if she has readings >130. Patient verbalized understanding.   Patient inquiring if she can resume taking all of her regular medications including '325mg'$  of aspirin daily.  Advised patient that RN will reach out to Dr. Berline Lopes regarding this and will follow up with the patient.   Advised patient that she will be receiving a call from [place] regarding scheduling her appointment to get her port placed. Patient verbalized understanding.   Instructed to call office with any fever, chills, purulent drainage, uncontrolled pain or any other questions or concerns. Patient verbalizes understanding.   Pt aware of post op appointments as well as the office number [phone] and after hours number [phone] to call if she has any questions or concerns

## 2021-11-05 LAB — CYTOLOGY - NON PAP

## 2021-11-06 LAB — SURGICAL PATHOLOGY

## 2021-11-09 ENCOUNTER — Telehealth: Payer: Self-pay | Admitting: Hematology and Oncology

## 2021-11-09 ENCOUNTER — Telehealth: Payer: Self-pay | Admitting: Gynecologic Oncology

## 2021-11-09 ENCOUNTER — Encounter: Payer: Self-pay | Admitting: Hematology and Oncology

## 2021-11-09 ENCOUNTER — Telehealth: Payer: Self-pay | Admitting: *Deleted

## 2021-11-09 DIAGNOSIS — C569 Malignant neoplasm of unspecified ovary: Secondary | ICD-10-CM | POA: Insufficient documentation

## 2021-11-09 DIAGNOSIS — C561 Malignant neoplasm of right ovary: Secondary | ICD-10-CM | POA: Insufficient documentation

## 2021-11-09 NOTE — Telephone Encounter (Signed)
Called patient, discussed results from surgery last week.  Patient is scheduled to see Dr. Alvy Bimler on Wednesday and have her port inserted on Thursday.  She has that we keep the phone visit for tomorrow so that I can explain results and plan with her daughter [name].  Jeral Pinch MD Gynecologic Oncology

## 2021-11-09 NOTE — Telephone Encounter (Signed)
Scheduled appt per 8/7 staff msg from [facility]. Pt is aware of appt date and time. Pt is aware to arrive 15 mins prior to appt time and to bring and updated insurance card. Pt is aware of appt location.

## 2021-11-09 NOTE — Telephone Encounter (Signed)
Pt's daughter [name] called and stated that her mom spoke with Dr.Tucker this morning and is in a panic because she didn't understand what was discussed. Informed [name] that per Dr.Tucker they will keep the phone appt for tomorrow and Dr.Tucker will review the results and plans with [name]. [name] verbalized understanding and did not have further questions.

## 2021-11-10 ENCOUNTER — Inpatient Hospital Stay: Payer: Medicare Other | Attending: Gynecologic Oncology | Admitting: Gynecologic Oncology

## 2021-11-10 ENCOUNTER — Other Ambulatory Visit: Payer: Self-pay | Admitting: *Deleted

## 2021-11-10 ENCOUNTER — Encounter: Payer: Self-pay | Admitting: Gynecologic Oncology

## 2021-11-10 ENCOUNTER — Other Ambulatory Visit: Payer: Self-pay | Admitting: Hematology and Oncology

## 2021-11-10 DIAGNOSIS — Z7982 Long term (current) use of aspirin: Secondary | ICD-10-CM | POA: Insufficient documentation

## 2021-11-10 DIAGNOSIS — Z5111 Encounter for antineoplastic chemotherapy: Secondary | ICD-10-CM | POA: Insufficient documentation

## 2021-11-10 DIAGNOSIS — Z8051 Family history of malignant neoplasm of kidney: Secondary | ICD-10-CM | POA: Insufficient documentation

## 2021-11-10 DIAGNOSIS — C561 Malignant neoplasm of right ovary: Secondary | ICD-10-CM | POA: Insufficient documentation

## 2021-11-10 DIAGNOSIS — Z9889 Other specified postprocedural states: Secondary | ICD-10-CM

## 2021-11-10 DIAGNOSIS — F17210 Nicotine dependence, cigarettes, uncomplicated: Secondary | ICD-10-CM | POA: Insufficient documentation

## 2021-11-10 DIAGNOSIS — Z86718 Personal history of other venous thrombosis and embolism: Secondary | ICD-10-CM | POA: Insufficient documentation

## 2021-11-10 DIAGNOSIS — Z7952 Long term (current) use of systemic steroids: Secondary | ICD-10-CM | POA: Insufficient documentation

## 2021-11-10 DIAGNOSIS — I6930 Unspecified sequelae of cerebral infarction: Secondary | ICD-10-CM | POA: Insufficient documentation

## 2021-11-10 NOTE — Progress Notes (Signed)
Gynecologic Oncology Telehealth Note: Gyn-Onc  I connected with Regina James on 11/10/21 at  4:40 PM EDT by telephone and verified that I am speaking with the correct person using two identifiers.  I discussed the limitations, risks, security and privacy concerns of performing an evaluation and management service by telemedicine and the availability of in-person appointments. I also discussed with the patient that there may be a patient responsible charge related to this service. The patient expressed understanding and agreed to proceed.  Other persons participating in the visit and their role in the encounter: patient's daughter, [name].  Patient's location: home Provider's location: [facility]  Reason for Visit: review of pathology from surgery, treatment planning  Treatment History: Oncology History  Right ovarian epithelial cancer ([place])  10/01/2021 Imaging   CT abdomen and pelvis 1. Large complex pelvic mass suspicious for malignancy arising from the right ovary. Further characterization with MRI and gynecology/surgical consult is advised. 2. No bowel obstruction. 3. Fatty liver. 4. Horseshoe kidney. 5. Aortic Atherosclerosis (ICD10-I70.0).   10/22/2021 Imaging   CT abdomen and pelvis 1. Slightly increased size of the mixed cystic and solid mass presumably arising from the right ovary concerning for primary ovarian malignancy. 2. New small volume abdominopelvic ascites. 3. Horseshoe kidney 4.  Aortic Atherosclerosis (ICD10-I70.0).   10/27/2021 Tumor Marker   Patient's tumor was tested for the following markers: CA-125. Results of the tumor marker test revealed 180.   11/03/2021 Pathology Results   FINAL MICROSCOPIC DIAGNOSIS:   A.   CUL DE SAC, ANTERIOR BIOPSY:  -    Adenocarcinoma, present on frozen section slides.   B.   CUL DE SAC, ANTERIOR, #2, BIOPSY:  -    Adenocarcinoma, strongly CDX-2 immunoreactive, most suggestive of gastrointestinal primary.   COMMENT:   The  immunophenotype favors a gastrointestinal primary.   The tumor (Block B1) was interrogated with immunohistochemical (IHC) stains (CK7, CK20, CDX-2, PAX8, ER, PR, p53, p16, vimentin).   The tumor is strongly CDX-2 positive and has focal p16 reactivity.  The tumor is negative for CK7, CK20, PAX8, ER, PR and vimentin.  The p53 is wildtype.  The IHC controls are satisfactory.    11/03/2021 Imaging   MRI pelvis 1. Large mixed solid and cystic mass of the right ovary measuring at least 13.6 x 7.4 x 9.9 cm, highly concerning for primary ovarian malignancy. 2. Mass closely abuts and possibly directly involves the posterior uterine fundus. 3. Normal left ovary. 4. No evidence of pelvic lymphadenopathy. 5. Partially imaged horseshoe kidney.     11/09/2021 Initial Diagnosis   Right ovarian epithelial cancer ([place])   11/09/2021 Cancer Staging   Staging form: Ovary, Fallopian Tube, and Primary Peritoneal Carcinoma, AJCC 8th Edition - Clinical stage from 11/09/2021: FIGO Stage III (cT3, cN0, cM0) - Signed by [name], MD on 11/09/2021 Stage prefix: Initial diagnosis   11/18/2021 -  Chemotherapy   Patient is on Treatment Plan : OVARIAN Carboplatin (AUC 6) / Paclitaxel (175) q21d x 6 cycles       Interval History: Doing well.  Taking a stool softener to help with bowel movements.  Past Medical/Surgical History: Past Medical History:  Diagnosis Date   Atrial septal aneurysm    Cancer ([place])    basal cell carcinoma   Carotid artery disease ([place])    History of PSVT (paroxysmal supraventricular tachycardia)    Reported back as far as 2010.   Hyperlipidemia    Hypertension    Mitral regurgitation    Palpitations  Penetrating ulcer of aorta ([place])    Stroke ([place])    x 3 cannot write as quickly as used to   Thoracic aortic aneurysm [facility])     Past Surgical History:  Procedure Laterality Date   APPENDECTOMY  1993   CERVICAL BIOPSY  W/ LOOP ELECTRODE EXCISION     CHOLECYSTECTOMY  1996    ENDARTERECTOMY Left 11/03/2016   Procedure: Exploration of Carotid  ENDARTERECTOMY.;  Surgeon: Elam Dutch, MD;  Location: [facility];  Service: Vascular;  Laterality: Left;   ENDARTERECTOMY Left 11/03/2016   Procedure: ENDARTERECTOMY CAROTID;  Surgeon: Elam Dutch, MD;  Location: [facility];  Service: Vascular;  Laterality: Left;   LAPAROSCOPY N/A 11/03/2021   Procedure: DIAGNOSTIC LAPAROSCOPY WITH BIOPSY;  Surgeon: Lafonda Mosses, MD;  Location: [facility];  Service: Gynecology;  Laterality: N/A;   LOOP RECORDER INSERTION N/A 11/02/2016   Procedure: Loop Recorder Insertion;  Surgeon: [name], MD;  Location: [facility];  Service: Cardiovascular;  Laterality: N/A;   ROTATOR CUFF REPAIR Right 1990   TEE WITHOUT CARDIOVERSION N/A 11/02/2016   Procedure: TRANSESOPHAGEAL ECHOCARDIOGRAM (TEE);  Surgeon: Lelon Perla, MD;  Location: [facility];  Service: Cardiovascular;  Laterality: N/A;   US ECHOCARDIOGRAPHY  01/09/2009   EF 55-60%   WRIST FRACTURE SURGERY      Family History  Problem Relation Age of Onset   Varicose Veins Mother    Cardiomyopathy Mother    Cancer Father        prostate   Diabetes Father    Kidney cancer Brother    Arrhythmia Brother    Colon cancer Neg Hx    Breast cancer Neg Hx    Ovarian cancer Neg Hx    Endometrial cancer Neg Hx    Pancreatic cancer Neg Hx     Social History   Socioeconomic History   Marital status: Widowed    Spouse name: Wouter   Number of children: 3   Years of education: 14   Highest education level: Not on file  Occupational History    Comment: retired, travel agent  Tobacco Use   Smoking status: Former    Packs/day: 0.25    Years: 45.00    Total pack years: 11.25    Types: Cigarettes    Quit date: 06/10/2016    Years since quitting: 5.4   Smokeless tobacco: Never   Tobacco comments:    Occasionally tobacco use - 1-2 cig a week  Vaping Use   Vaping Use: Never used  Substance and Sexual Activity   Alcohol  use: Yes    Alcohol/week: 7.0 standard drinks of alcohol    Types: 7 Glasses of wine per week    Comment: glass of wine 3-4 days per wek   Drug use: No   Sexual activity: Not on file  Other Topics Concern   Not on file  Social History Narrative   Lives at home w/husband   Caffeine- coffee, 2 cups daily   Social Determinants of Health   Financial Resource Strain: Not on file  Food Insecurity: Not on file  Transportation Needs: Not on file  Physical Activity: Not on file  Stress: Not on file  Social Connections: Not on file    Current Medications:  Current Outpatient Medications:    aspirin 325 MG tablet, Take 1 tablet (325 mg total) by mouth daily., Disp: , Rfl:    cholecalciferol (VITAMIN D3) 25 MCG (1000 UNIT) tablet, Take 1,000 Units by mouth  daily., Disp: , Rfl:    doxylamine, Sleep, (SLEEP AID) 25 MG tablet, Take 25 mg by mouth at bedtime as needed for sleep., Disp: , Rfl:    latanoprost (XALATAN) 0.005 % ophthalmic solution, Place 1 drop into the left eye at bedtime., Disp: , Rfl:    metoprolol succinate (TOPROL-XL) 100 MG 24 hr tablet, Take 1 tablet (100 mg total) by mouth daily. Schedule an appointment for further refills, 2nd attempt (Patient taking differently: Take 100 mg by mouth daily. Schedule an appointment for further refills, 2nd attempt Patient reports she takes 1/2 tablet - 72m per day), Disp: 90 tablet, Rfl: 3   naproxen sodium (ALEVE) 220 MG tablet, Take 220 mg by mouth daily as needed (headache)., Disp: , Rfl:    ondansetron (ZOFRAN) 4 MG tablet, Take 1 tablet (4 mg total) by mouth every 8 (eight) hours as needed for nausea or vomiting., Disp: 15 tablet, Rfl: 0   oxyCODONE (OXY IR/ROXICODONE) 5 MG immediate release tablet, Take 1 tablet (5 mg total) by mouth every 4 (four) hours as needed for severe pain. For AFTER surgery only, do not take and drive, Disp: 15 tablet, Rfl: 0   Polyethyl Glycol-Propyl Glycol (SYSTANE) 0.4-0.3 % SOLN, Place 1 drop into both eyes  daily as needed for dry eyes., Disp: , Rfl:    pravastatin (PRAVACHOL) 20 MG tablet, TAKE 1 TABLET AT 6PM. (Patient taking differently: Take 20 mg by mouth daily.), Disp: 30 tablet, Rfl: 1   senna-docusate (SENOKOT-S) 8.6-50 MG tablet, Take 2 tablets by mouth at bedtime. For AFTER surgery, do not take if having diarrhea, Disp: 30 tablet, Rfl: 0   timolol (TIMOPTIC) 0.5 % ophthalmic solution, Place 1 drop into the left eye 2 (two) times daily., Disp: , Rfl:   Review of Symptoms: Pertinent positives as per HPI.  Physical Exam: There were no vitals taken for this visit. Deferred given limitations of phone visit.  Laboratory & Radiologic Studies: None new  Assessment & Plan: STYASIA PACKARDis a 88y.o. woman with Stage IIIB adenocarcinoma of the ovary who presents for telephone follow-up.  Spoke with the patient and her daughter today.  I had discussed with the patient yesterday pathology results and our discussion at tumor board.  Given imaging findings as well as those at the time of surgery, I strongly favor that this is an ovarian primary.  I still recommend that we proceed with neoadjuvant chemotherapy and if good response on imaging and her tumor marker, we will plan for interval debulking after 3 cycles.  We discussed the scenario that if she were not to have expected response on repeat imaging, we still may consider surgery to help elucidate the diagnosis.  I discussed the assessment and treatment plan with the patient. The patient was provided with an opportunity to ask questions and all were answered. The patient agreed with the plan and demonstrated an understanding of the instructions.   The patient was advised to call back or see an in-person evaluation if the symptoms worsen or if the condition fails to improve as anticipated.   12 minutes of total time was spent for this patient encounter, including preparation, phone counseling with the patient and coordination of care, and  documentation of the encounter.   KJeral Pinch MD  Division of Gynecologic Oncology  Department of Obstetrics and Gynecology  [facility]

## 2021-11-10 NOTE — Progress Notes (Signed)
START ON PATHWAY REGIMEN - Ovarian     A cycle is every 21 days:     Paclitaxel      Carboplatin   **Always confirm dose/schedule in your pharmacy ordering system**  Patient Characteristics: Preoperative or Nonsurgical Candidate (Clinical Staging), Newly Diagnosed, Neoadjuvant Therapy followed by Surgery BRCA Mutation Status: Awaiting Test Results Therapeutic Status: Preoperative or Nonsurgical Candidate (Clinical Staging) AJCC T Category: cT3c AJCC 8 Stage Grouping: IIIC AJCC N Category: cN0 AJCC M Category: cM0 Therapy Plan: Neoadjuvant Therapy followed by Surgery Intent of Therapy: Curative Intent, Discussed with Patient

## 2021-11-11 ENCOUNTER — Other Ambulatory Visit: Payer: Self-pay

## 2021-11-11 ENCOUNTER — Inpatient Hospital Stay (HOSPITAL_BASED_OUTPATIENT_CLINIC_OR_DEPARTMENT_OTHER): Payer: Medicare Other | Admitting: Hematology and Oncology

## 2021-11-11 ENCOUNTER — Encounter: Payer: Self-pay | Admitting: Hematology and Oncology

## 2021-11-11 ENCOUNTER — Other Ambulatory Visit: Payer: Self-pay | Admitting: Radiology

## 2021-11-11 VITALS — BP 145/65 | HR 71 | Temp 97.7°F | Resp 16 | Ht 66.0 in | Wt 163.8 lb

## 2021-11-11 DIAGNOSIS — Z5111 Encounter for antineoplastic chemotherapy: Secondary | ICD-10-CM | POA: Diagnosis not present

## 2021-11-11 DIAGNOSIS — Z7982 Long term (current) use of aspirin: Secondary | ICD-10-CM | POA: Diagnosis not present

## 2021-11-11 DIAGNOSIS — Z8051 Family history of malignant neoplasm of kidney: Secondary | ICD-10-CM | POA: Diagnosis not present

## 2021-11-11 DIAGNOSIS — C561 Malignant neoplasm of right ovary: Secondary | ICD-10-CM

## 2021-11-11 DIAGNOSIS — F17210 Nicotine dependence, cigarettes, uncomplicated: Secondary | ICD-10-CM

## 2021-11-11 DIAGNOSIS — Z720 Tobacco use: Secondary | ICD-10-CM

## 2021-11-11 DIAGNOSIS — I63412 Cerebral infarction due to embolism of left middle cerebral artery: Secondary | ICD-10-CM

## 2021-11-11 DIAGNOSIS — Z86718 Personal history of other venous thrombosis and embolism: Secondary | ICD-10-CM

## 2021-11-11 DIAGNOSIS — Z7952 Long term (current) use of systemic steroids: Secondary | ICD-10-CM | POA: Diagnosis not present

## 2021-11-11 DIAGNOSIS — I6930 Unspecified sequelae of cerebral infarction: Secondary | ICD-10-CM | POA: Diagnosis not present

## 2021-11-11 MED ORDER — LIDOCAINE-PRILOCAINE 2.5-2.5 % EX CREA: TOPICAL_CREAM | CUTANEOUS | 3 refills | Status: DC

## 2021-11-11 MED ORDER — DEXAMETHASONE 4 MG PO TABS: ORAL_TABLET | ORAL | 6 refills | Status: DC

## 2021-11-11 MED ORDER — PROCHLORPERAZINE MALEATE 10 MG PO TABS: 10.0000 mg | ORAL_TABLET | Freq: Four times a day (QID) | ORAL | 1 refills | Status: DC | PRN

## 2021-11-11 MED ORDER — ONDANSETRON HCL 8 MG PO TABS: 8.0000 mg | ORAL_TABLET | Freq: Three times a day (TID) | ORAL | 1 refills | Status: DC | PRN

## 2021-11-11 NOTE — Assessment & Plan Note (Signed)
Unfortunately, she continues to smoke We discussed importance of nicotine cessation

## 2021-11-11 NOTE — Progress Notes (Signed)
[place] CONSULT NOTE  Patient Care Team: Regina Lass, MD as PCP - General (Family Medicine) Regina Pickett Nadean Corwin, MD as PCP - Cardiology (Cardiology) Regina Pickett Nadean Corwin, MD as Consulting Physician (Cardiology)  ASSESSMENT & PLAN:  Right ovarian epithelial cancer [facility]) I reviewed imaging study and we discussed importance of neoadjuvant chemotherapy approach We reviewed the NCCN guidelines We discussed the role of chemotherapy. The intent is of curative intent.  We discussed some of the risks, benefits, side-effects of carboplatin & Taxol. Treatment is intravenous, every 3 weeks x 6 cycles  Some of the short term side-effects included, though not limited to, including weight loss, life threatening infections, risk of allergic reactions, need for transfusions of blood products, nausea, vomiting, change in bowel habits, loss of hair, admission to hospital for various reasons, and risks of death.   Long term side-effects are also discussed including risks of infertility, permanent damage to nerve function, hearing loss, chronic fatigue, kidney damage with possibility needing hemodialysis, and rare secondary malignancy including bone marrow disorders.  The patient is aware that the response rates discussed earlier is not guaranteed.  After a long discussion, patient made an informed decision to proceed with the prescribed plan of care.   Patient education material was dispensed. We discussed premedication with dexamethasone before chemotherapy. She is scheduled for port placement I will schedule chemo education class, blood work and for her to start treatment as soon as possible I plan to see her weekly after cycle 1 of treatment for toxicity reviewed I recommend minimum 3 cycles of treatment before repeating imaging study She would benefit from genetic counseling in the future  Cerebrovascular accident (CVA) due to embolism of left middle cerebral artery ([place]) She has history  of stroke and mild residual permanent right-sided weakness She is able to cope well with her mild disability She has excellent family support I recommend resumption of aspirin therapy  Tobacco abuse Unfortunately, she continues to smoke We discussed importance of nicotine cessation  Orders Placed This Encounter  Procedures   CBC with Differential ([place] Only)    Standing Status:   Standing    Number of Occurrences:   20    Standing Expiration Date:   11/12/2022   CMP ([place] only)    Standing Status:   Standing    Number of Occurrences:   20    Standing Expiration Date:   11/12/2022   CA 125    Standing Status:   Standing    Number of Occurrences:   11    Standing Expiration Date:   11/12/2022    The total time spent in the appointment was 60 minutes encounter with patients including review of chart and various tests results, discussions about plan of care and coordination of care plan   All questions were answered. The patient knows to call the clinic with any problems, questions or concerns. No barriers to learning was detected.  Regina Lark, MD 8/9/20231:50 PM  CHIEF COMPLAINTS/PURPOSE OF CONSULTATION:  Ovarian cancer, for further evaluation and management  HISTORY OF PRESENTING ILLNESS:  Regina James 88 y.o. female is here because of recent diagnosis of ovarian cancer She is here accompanied by her daughter, Regina James She is widowed but lives somewhat independently by herself She has remote history of stroke with mild permanent right-sided neurological deficits.  Unfortunately, because she continues to smoke periodically She was diagnosed with ovarian cancer after presentation with abdominal discomfort I have reviewed her chart and materials  related to her cancer extensively and collaborated history with the patient. Summary of oncologic history is as follows: Oncology History  Right ovarian epithelial cancer ([place])  09/30/2021 Initial Diagnosis   Patient was seen  by her primary care provider at the end of June.  She endorsed decreased appetite as well as abdominal pain for approximately 2 weeks.   10/01/2021 Imaging   CT abdomen and pelvis 1. Large complex pelvic mass suspicious for malignancy arising from the right ovary. Further characterization with MRI and gynecology/surgical consult is advised. 2. No bowel obstruction. 3. Fatty liver. 4. Horseshoe kidney. 5. Aortic Atherosclerosis (ICD10-I70.0).   10/22/2021 Imaging   CT abdomen and pelvis 1. Slightly increased size of the mixed cystic and solid mass presumably arising from the right ovary concerning for primary ovarian malignancy. 2. New small volume abdominopelvic ascites. 3. Horseshoe kidney 4.  Aortic Atherosclerosis (ICD10-I70.0).   10/27/2021 Tumor Marker   Patient's tumor was tested for the following markers: CA-125. Results of the tumor marker test revealed 180.   10/30/2021 Initial Diagnosis   Patient was seen by her primary care provider at the end of June.  She endorsed decreased appetite as well as abdominal pain for approximately 2 weeks   11/03/2021 Pathology Results   FINAL MICROSCOPIC DIAGNOSIS:   A.   CUL DE SAC, ANTERIOR BIOPSY:  -    Adenocarcinoma, present on frozen section slides.   B.   CUL DE SAC, ANTERIOR, #2, BIOPSY:  -    Adenocarcinoma, strongly CDX-2 immunoreactive, most suggestive of gastrointestinal primary.   COMMENT:   The immunophenotype favors a gastrointestinal primary.   The tumor (Block B1) was interrogated with immunohistochemical (IHC) stains (CK7, CK20, CDX-2, PAX8, ER, PR, p53, p16, vimentin).   The tumor is strongly CDX-2 positive and has focal p16 reactivity.  The tumor is negative for CK7, CK20, PAX8, ER, PR and vimentin.  The p53 is wildtype.  The IHC controls are satisfactory.    11/03/2021 Imaging   MRI pelvis 1. Large mixed solid and cystic mass of the right ovary measuring at least 13.6 x 7.4 x 9.9 cm, highly concerning for primary  ovarian malignancy. 2. Mass closely abuts and possibly directly involves the posterior uterine fundus. 3. Normal left ovary. 4. No evidence of pelvic lymphadenopathy. 5. Partially imaged horseshoe kidney.     11/09/2021 Initial Diagnosis   Right ovarian epithelial cancer ([place])   11/09/2021 Cancer Staging   Staging form: Ovary, Fallopian Tube, and Primary Peritoneal Carcinoma, AJCC 8th Edition - Clinical stage from 11/09/2021: FIGO Stage III (cT3, cN0, cM0) - Signed by Regina Lark, MD on 11/09/2021 Stage prefix: Initial diagnosis   11/18/2021 -  Chemotherapy   Patient is on Treatment Plan : OVARIAN Carboplatin (AUC 6) / Paclitaxel (175) q21d x 6 cycles       MEDICAL HISTORY:  Past Medical History:  Diagnosis Date   Atrial septal aneurysm    Cancer ([place])    basal cell carcinoma   Carotid artery disease ([place])    History of PSVT (paroxysmal supraventricular tachycardia)    Reported back as far as 2010.   Hyperlipidemia    Hypertension    Mitral regurgitation    Palpitations    Penetrating ulcer of aorta (H[facility])    Stroke ([place])    x 3 cannot write as quickly as used to   Thoracic aortic aneurysm [facility])     SURGICAL HISTORY: Past Surgical History:  Procedure Laterality Date   APPENDECTOMY  1993  CERVICAL BIOPSY  W/ LOOP ELECTRODE EXCISION     CHOLECYSTECTOMY  1996   ENDARTERECTOMY Left 11/03/2016   Procedure: Exploration of Carotid  ENDARTERECTOMY.;  Surgeon: Elam Dutch, MD;  Location: [place];  Service: Vascular;  Laterality: Left;   ENDARTERECTOMY Left 11/03/2016   Procedure: ENDARTERECTOMY CAROTID;  Surgeon: Elam Dutch, MD;  Location: [place];  Service: Vascular;  Laterality: Left;   LAPAROSCOPY N/A 11/03/2021   Procedure: DIAGNOSTIC LAPAROSCOPY WITH BIOPSY;  Surgeon: Lafonda Mosses, MD;  Location: [facility];  Service: Gynecology;  Laterality: N/A;   LOOP RECORDER INSERTION N/A 11/02/2016   Procedure: Loop Recorder Insertion;  Surgeon: [name], MD;  Location: [place] CV LAB;  Service: Cardiovascular;  Laterality: N/A;   ROTATOR CUFF REPAIR Right 1990   TEE WITHOUT CARDIOVERSION N/A 11/02/2016   Procedure: TRANSESOPHAGEAL ECHOCARDIOGRAM (TEE);  Surgeon: Lelon Perla, MD;  Location: [facility] ENDOSCOPY;  Service: Cardiovascular;  Laterality: N/A;   US ECHOCARDIOGRAPHY  01/09/2009   EF 55-60%   WRIST FRACTURE SURGERY      SOCIAL HISTORY: Social History   Socioeconomic History   Marital status: Widowed    Spouse name: Wouter   Number of children: 3   Years of education: 14   Highest education level: Not on file  Occupational History    Comment: retired, travel agent  Tobacco Use   Smoking status: Former    Packs/day: 0.25    Years: 45.00    Total pack years: 11.25    Types: Cigarettes    Quit date: 06/10/2016    Years since quitting: 5.4   Smokeless tobacco: Never   Tobacco comments:    Occasionally tobacco use - 1-2 cig a week  Vaping Use   Vaping Use: Never used  Substance and Sexual Activity   Alcohol use: Yes    Alcohol/week: 7.0 standard drinks of alcohol    Types: 7 Glasses of wine per week    Comment: glass of wine 3-4 days per wek   Drug use: No   Sexual activity: Not on file  Other Topics Concern   Not on file  Social History Narrative   Lives at home w/husband   Caffeine- coffee, 2 cups daily   Social Determinants of Health   Financial Resource Strain: Not on file  Food Insecurity: Not on file  Transportation Needs: Not on file  Physical Activity: Not on file  Stress: Not on file  Social Connections: Not on file  Intimate Partner Violence: Not on file    FAMILY HISTORY: Family History  Problem Relation Age of Onset   Varicose Veins Mother    Cardiomyopathy Mother    Cancer Father        prostate   Diabetes Father    Kidney cancer Brother    Arrhythmia Brother    Colon cancer Neg Hx    Breast cancer Neg Hx    Ovarian cancer Neg Hx    Endometrial cancer Neg Hx    Pancreatic cancer Neg Hx      ALLERGIES:  is allergic to losartan, dorzolamide hcl-timolol mal, morphine and related, and levofloxacin.  MEDICATIONS:  Current Outpatient Medications  Medication Sig Dispense Refill   dexamethasone (DECADRON) 4 MG tablet Take 2 tabs at the night before and 2 tab the morning of chemotherapy, every 3 weeks, by mouth x 6 cycles 36 tablet 6   polyethylene glycol (MIRALAX / GLYCOLAX) 17 g packet Take 17 g by mouth daily.  aspirin 325 MG tablet Take 1 tablet (325 mg total) by mouth daily.     cholecalciferol (VITAMIN D3) 25 MCG (1000 UNIT) tablet Take 1,000 Units by mouth daily.     doxylamine, Sleep, (SLEEP AID) 25 MG tablet Take 25 mg by mouth at bedtime as needed for sleep.     latanoprost (XALATAN) 0.005 % ophthalmic solution Place 1 drop into the left eye at bedtime.     lidocaine-prilocaine (EMLA) cream Apply to affected area once 30 g 3   metoprolol succinate (TOPROL-XL) 100 MG 24 hr tablet Take 1 tablet (100 mg total) by mouth daily. Schedule an appointment for further refills, 2nd attempt (Patient taking differently: Take 100 mg by mouth daily. Schedule an appointment for further refills, 2nd attempt Patient reports she takes 1/2 tablet - 30m per day) 90 tablet 3   ondansetron (ZOFRAN) 8 MG tablet Take 1 tablet (8 mg total) by mouth every 8 (eight) hours as needed for refractory nausea / vomiting. Start on day 3 after carboplatin chemo. 30 tablet 1   oxyCODONE (OXY IR/ROXICODONE) 5 MG immediate release tablet Take 1 tablet (5 mg total) by mouth every 4 (four) hours as needed for severe pain. For AFTER surgery only, do not take and drive 15 tablet 0   Polyethyl Glycol-Propyl Glycol (SYSTANE) 0.4-0.3 % SOLN Place 1 drop into both eyes daily as needed for dry eyes.     pravastatin (PRAVACHOL) 20 MG tablet TAKE 1 TABLET AT 6PM. (Patient taking differently: Take 20 mg by mouth daily.) 30 tablet 1   prochlorperazine (COMPAZINE) 10 MG tablet Take 1 tablet (10 mg total) by mouth every 6  (six) hours as needed (Nausea or vomiting). 30 tablet 1   senna-docusate (SENOKOT-S) 8.6-50 MG tablet Take 2 tablets by mouth at bedtime. For AFTER surgery, do not take if having diarrhea 30 tablet 0   timolol (TIMOPTIC) 0.5 % ophthalmic solution Place 1 drop into the left eye 2 (two) times daily.     No current facility-administered medications for this visit.    REVIEW OF SYSTEMS:   Constitutional: Denies fevers, chills or abnormal night sweats Eyes: Denies blurriness of vision, double vision or watery eyes Ears, nose, mouth, throat, and face: Denies mucositis or sore throat Respiratory: Denies cough, dyspnea or wheezes Cardiovascular: Denies palpitation, chest discomfort or lower extremity swelling Skin: Denies abnormal skin rashes Lymphatics: Denies new lymphadenopathy or easy bruising Neurological:Denies numbness, tingling or new weaknesses Behavioral/Psych: Mood is stable, no new changes  All other systems were reviewed with the patient and are negative.  PHYSICAL EXAMINATION: ECOG PERFORMANCE STATUS: 1 - Symptomatic but completely ambulatory  Vitals:   11/11/21 0823  BP: (!) 145/65  Pulse: 71  Resp: 16  Temp: 97.7 F (36.5 C)  SpO2: 95%   Filed Weights   11/11/21 0823  Weight: 163 lb 12.8 oz (74.3 kg)    GENERAL:alert, no distress and comfortable SKIN: skin color, texture, turgor are normal, no rashes or significant lesions EYES: normal, conjunctiva are pink and non-injected, sclera clear OROPHARYNX:no exudate, no erythema and lips, buccal mucosa, and tongue normal  NECK: supple, thyroid normal size, non-tender, without nodularity LYMPH:  no palpable lymphadenopathy in the cervical, axillary or inguinal LUNGS: clear to auscultation and percussion with normal breathing effort HEART: regular rate & rhythm and no murmurs and no lower extremity edema ABDOMEN:abdomen soft, palpable abdominal fullness. Musculoskeletal:no cyanosis of digits and no clubbing  PSYCH: alert  & oriented x 3 with fluent speech NEURO: no  focal motor/sensory deficits  LABORATORY DATA:  I have reviewed the data as listed Lab Results  Component Value Date   WBC 10.5 11/03/2021   HGB 13.9 11/03/2021   HCT 42.4 11/03/2021   MCV 94.6 11/03/2021   PLT 519 (H) 11/03/2021   Recent Labs    10/22/21 1201 11/03/21 1318  NA 140 139  K 3.9 4.3  CL 106 107  CO2 22 22  GLUCOSE 111* 100*  BUN 19 14  CREATININE 0.97 0.74  CALCIUM 8.8* 8.6*  GFRNONAA 57* >60  PROT 6.7 6.6  ALBUMIN 3.5 3.4*  AST 23 24  ALT 15 15  ALKPHOS 91 85  BILITOT 0.7 1.0    RADIOGRAPHIC STUDIES: I have personally reviewed the radiological images as listed and agreed with the findings in the report. CT Abdomen Pelvis W Contrast  Result Date: 10/22/2021 CLINICAL DATA:  Left lower quadrant abdominal pain EXAM: CT ABDOMEN AND PELVIS WITH CONTRAST TECHNIQUE: Multidetector CT imaging of the abdomen and pelvis was performed using the standard protocol following bolus administration of intravenous contrast. RADIATION DOSE REDUCTION: This exam was performed according to the departmental dose-optimization program which includes automated exposure control, adjustment of the mA and/or kV according to patient size and/or use of iterative reconstruction technique. CONTRAST:  159m OMNIPAQUE IOHEXOL 300 MG/ML  SOLN COMPARISON:  CT abdomen and pelvis 10/01/2021; MRI pelvis 09/03/2021 FINDINGS: Lower chest: Scarring/atelectasis in the bilateral lower lobes and lingula. Hepatobiliary: No focal liver abnormality is seen. Status post cholecystectomy. Similar dilation of the common bile duct measuring 12 mm likely related to post cholecystectomy state. Pancreas: Redemonstrated 13 mm low density area in the distal body of the pancreas favored to represent a side branch IPMN. Dilated main pancreatic duct measuring 6 mm in maximum diameter, unchanged. No active inflammatory change. Spleen: Normal in size without focal abnormality.  Adrenals/Urinary Tract: Horseshoe kidney. Unchanged simple appearing renal cysts. No urinary calculi or hydronephrosis. Decompressed bladder. Unremarkable adrenal glands. Stomach/Bowel: Stomach is within normal limits. Appendectomy. No evidence of bowel wall thickening, distention, or inflammatory changes. Vascular/Lymphatic: Moderate aortoiliac atherosclerotic disease. Reproductive: There is a 8 x 11 x 14 mixed cystic and solid mass within the pelvis superior to the uterus concerning for malignancy possibly arising from the right ovary. This is slightly increased in size from CT 10/01/2021 when it measured approximately 7 x 9 x 13 cm using similar measuring technique. Other: New small volume perihepatic and perisplenic fluid tracking inferiorly into the pericolic gutters and pelvis. Musculoskeletal: Anterolisthesis L4 on L5. IMPRESSION: 1. Slightly increased size of the mixed cystic and solid mass presumably arising from the right ovary concerning for primary ovarian malignancy. 2. New small volume abdominopelvic ascites. 3. Horseshoe kidney 4.  Aortic Atherosclerosis (ICD10-I70.0). Electronically Signed   By: TPlacido SouM.D.   On: 10/22/2021 15:23   DG Chest Port 1 View  Result Date: 10/22/2021 CLINICAL DATA:  Abdominal pain with nausea today. EXAM: PORTABLE CHEST 1 VIEW COMPARISON:  Radiographs 11/04/2016.  CT 05/19/2020. FINDINGS: 1225 hours. The heart is mildly enlarged. There is aortic atherosclerosis. The lungs are clear. No pleural effusion or pneumothorax. Glenohumeral degenerative changes are present bilaterally with evidence of a chronic rotator cuff tear on the right. No acute osseous findings. Telemetry leads overlie the chest. Probable loop recorder within the left upper anterior abdominal wall. IMPRESSION: No evidence of active cardiopulmonary process.  Cardiomegaly. Electronically Signed   By: WRichardean SaleM.D.   On: 10/22/2021 13:10

## 2021-11-11 NOTE — Assessment & Plan Note (Signed)
I reviewed imaging study and we discussed importance of neoadjuvant chemotherapy approach We reviewed the NCCN guidelines We discussed the role of chemotherapy. The intent is of curative intent.  We discussed some of the risks, benefits, side-effects of carboplatin & Taxol. Treatment is intravenous, every 3 weeks x 6 cycles  Some of the short term side-effects included, though not limited to, including weight loss, life threatening infections, risk of allergic reactions, need for transfusions of blood products, nausea, vomiting, change in bowel habits, loss of hair, admission to hospital for various reasons, and risks of death.   Long term side-effects are also discussed including risks of infertility, permanent damage to nerve function, hearing loss, chronic fatigue, kidney damage with possibility needing hemodialysis, and rare secondary malignancy including bone marrow disorders.  The patient is aware that the response rates discussed earlier is not guaranteed.  After a long discussion, patient made an informed decision to proceed with the prescribed plan of care.   Patient education material was dispensed. We discussed premedication with dexamethasone before chemotherapy. She is scheduled for port placement I will schedule chemo education class, blood work and for her to start treatment as soon as possible I plan to see her weekly after cycle 1 of treatment for toxicity reviewed I recommend minimum 3 cycles of treatment before repeating imaging study She would benefit from genetic counseling in the future

## 2021-11-11 NOTE — Assessment & Plan Note (Addendum)
She has history of stroke and mild residual permanent right-sided weakness She is able to cope well with her mild disability She has excellent family support I recommend resumption of aspirin therapy

## 2021-11-12 ENCOUNTER — Telehealth: Payer: Self-pay | Admitting: Emergency Medicine

## 2021-11-12 ENCOUNTER — Ambulatory Visit (HOSPITAL_COMMUNITY)
Admission: RE | Admit: 2021-11-12 | Discharge: 2021-11-12 | Disposition: A | Discharge status: Home / Self Care | Payer: Medicare Other | Source: Ambulatory Visit | Attending: Gynecologic Oncology | Admitting: Gynecologic Oncology

## 2021-11-12 ENCOUNTER — Encounter (HOSPITAL_COMMUNITY): Payer: Self-pay

## 2021-11-12 DIAGNOSIS — C561 Malignant neoplasm of right ovary: Secondary | ICD-10-CM | POA: Insufficient documentation

## 2021-11-12 DIAGNOSIS — I6529 Occlusion and stenosis of unspecified carotid artery: Secondary | ICD-10-CM | POA: Diagnosis not present

## 2021-11-12 DIAGNOSIS — Z452 Encounter for adjustment and management of vascular access device: Secondary | ICD-10-CM | POA: Diagnosis not present

## 2021-11-12 DIAGNOSIS — I10 Essential (primary) hypertension: Secondary | ICD-10-CM | POA: Insufficient documentation

## 2021-11-12 DIAGNOSIS — I471 Supraventricular tachycardia: Secondary | ICD-10-CM | POA: Diagnosis not present

## 2021-11-12 DIAGNOSIS — I340 Nonrheumatic mitral (valve) insufficiency: Secondary | ICD-10-CM | POA: Diagnosis not present

## 2021-11-12 DIAGNOSIS — Z87891 Personal history of nicotine dependence: Secondary | ICD-10-CM | POA: Insufficient documentation

## 2021-11-12 DIAGNOSIS — Z8673 Personal history of transient ischemic attack (TIA), and cerebral infarction without residual deficits: Secondary | ICD-10-CM | POA: Insufficient documentation

## 2021-11-12 DIAGNOSIS — E785 Hyperlipidemia, unspecified: Secondary | ICD-10-CM | POA: Diagnosis not present

## 2021-11-12 DIAGNOSIS — I714 Abdominal aortic aneurysm, without rupture, unspecified: Secondary | ICD-10-CM | POA: Diagnosis not present

## 2021-11-12 HISTORY — PX: IR IMAGING GUIDED PORT INSERTION: IMG5740

## 2021-11-12 MED ORDER — FENTANYL CITRATE (PF) 100 MCG/2ML IJ SOLN
INTRAMUSCULAR | Status: AC
  Filled 2021-11-12: qty 2

## 2021-11-12 MED ORDER — SODIUM CHLORIDE 0.9 % IV SOLN: INTRAVENOUS | Status: DC

## 2021-11-12 MED ORDER — HEPARIN SOD (PORK) LOCK FLUSH 100 UNIT/ML IV SOLN
INTRAVENOUS | Status: AC
  Filled 2021-11-12: qty 5

## 2021-11-12 MED ORDER — LIDOCAINE-EPINEPHRINE 1 %-1:100000 IJ SOLN
INTRAMUSCULAR | Status: AC
  Filled 2021-11-12: qty 1

## 2021-11-12 MED ORDER — LIDOCAINE-EPINEPHRINE 1 %-1:100000 IJ SOLN
INTRAMUSCULAR | Status: AC | PRN
  Administered 2021-11-12: 10 mL via INTRADERMAL

## 2021-11-12 MED ORDER — MIDAZOLAM HCL 2 MG/2ML IJ SOLN
INTRAMUSCULAR | Status: AC | PRN
  Administered 2021-11-12: .5 mg via INTRAVENOUS

## 2021-11-12 MED ORDER — FENTANYL CITRATE (PF) 100 MCG/2ML IJ SOLN
INTRAMUSCULAR | Status: AC | PRN
  Administered 2021-11-12: 25 ug via INTRAVENOUS

## 2021-11-12 MED ORDER — HEPARIN SOD (PORK) LOCK FLUSH 100 UNIT/ML IV SOLN
INTRAVENOUS | Status: AC | PRN
  Administered 2021-11-12: 500 [IU] via INTRAVENOUS

## 2021-11-12 MED ORDER — MIDAZOLAM HCL 2 MG/2ML IJ SOLN
INTRAMUSCULAR | Status: AC
  Filled 2021-11-12: qty 2

## 2021-11-12 NOTE — Consult Note (Signed)
Chief Complaint: Patient was seen in consultation today for port a cath placement  Referring Physician(s): [place]  Supervising Physician: Jacqulynn Cadet  Patient Status: [facility] - Out-pt  History of Present Illness: Regina James is an 88 y.o. female with past medical history of atrial septal aneurysm, skin cancer, carotid artery disease, PSVT, hyperlipidemia, hypertension, mitral regurgitation, stroke, thoracic AAA, history of loop recorder who presents now with newly diagnosed right ovarian cancer.  She is scheduled today for Port-A-Cath placement to assist with treatment.  Past Medical History:  Diagnosis Date   Atrial septal aneurysm    Cancer ([place])    basal cell carcinoma   Carotid artery disease ([place])    History of PSVT (paroxysmal supraventricular tachycardia)    Reported back as far as 2010.   Hyperlipidemia    Hypertension    Mitral regurgitation    Palpitations    Penetrating ulcer of aorta (HCC)    Stroke ([place])    x 3 cannot write as quickly as used to   Thoracic aortic aneurysm [facility])     Past Surgical History:  Procedure Laterality Date   APPENDECTOMY  1993   CERVICAL BIOPSY  W/ LOOP ELECTRODE EXCISION     CHOLECYSTECTOMY  1996   ENDARTERECTOMY Left 11/03/2016   Procedure: Exploration of Carotid  ENDARTERECTOMY.;  Surgeon: Elam Dutch, MD;  Location: [facility];  Service: Vascular;  Laterality: Left;   ENDARTERECTOMY Left 11/03/2016   Procedure: ENDARTERECTOMY CAROTID;  Surgeon: Elam Dutch, MD;  Location: [place];  Service: Vascular;  Laterality: Left;   LAPAROSCOPY N/A 11/03/2021   Procedure: DIAGNOSTIC LAPAROSCOPY WITH BIOPSY;  Surgeon: Lafonda Mosses, MD;  Location: [facility] ORS;  Service: Gynecology;  Laterality: N/A;   LOOP RECORDER INSERTION N/A 11/02/2016   Procedure: Loop Recorder Insertion;  Surgeon: [name], MD;  Location: [place] CV LAB;  Service: Cardiovascular;  Laterality: N/A;   ROTATOR CUFF REPAIR Right 1990    TEE WITHOUT CARDIOVERSION N/A 11/02/2016   Procedure: TRANSESOPHAGEAL ECHOCARDIOGRAM (TEE);  Surgeon: Lelon Perla, MD;  Location: [facility] ENDOSCOPY;  Service: Cardiovascular;  Laterality: N/A;   US ECHOCARDIOGRAPHY  01/09/2009   EF 55-60%   WRIST FRACTURE SURGERY      Allergies: Losartan, Dorzolamide hcl-timolol mal, Morphine and related, and Levofloxacin  Medications: Prior to Admission medications   Medication Sig Start Date End Date Taking? Authorizing Provider  aspirin 325 MG tablet Take 1 tablet (325 mg total) by mouth daily. 11/10/16  Yes Alvia Grove, PA-C  cholecalciferol (VITAMIN D3) 25 MCG (1000 UNIT) tablet Take 1,000 Units by mouth daily.   Yes [provider]  doxylamine, Sleep, (SLEEP AID) 25 MG tablet Take 25 mg by mouth at bedtime as needed for sleep.   Yes [provider]  latanoprost (XALATAN) 0.005 % ophthalmic solution Place 1 drop into the left eye at bedtime. 10/04/21  Yes [provider]  metoprolol succinate (TOPROL-XL) 100 MG 24 hr tablet Take 1 tablet (100 mg total) by mouth daily. Schedule an appointment for further refills, 2nd attempt Patient taking differently: Take 100 mg by mouth daily. Schedule an appointment for further refills, 2nd attempt Patient reports she takes 1/2 tablet - '50mg'$  per day 08/26/21  Yes Hilty, Nadean Corwin, MD  Polyethyl Glycol-Propyl Glycol (SYSTANE) 0.4-0.3 % SOLN Place 1 drop into both eyes daily as needed for dry eyes.   Yes [provider]  pravastatin (PRAVACHOL) 20 MG tablet TAKE 1 TABLET AT 6PM. Patient taking  differently: Take 20 mg by mouth daily. 12/05/19  Yes Hilty, Nadean Corwin, MD  timolol (TIMOPTIC) 0.5 % ophthalmic solution Place 1 drop into the left eye 2 (two) times daily. 01/07/20  Yes [provider]  dexamethasone (DECADRON) 4 MG tablet Take 2 tabs at the night before and 2 tab the morning of chemotherapy, every 3 weeks, by mouth x 6 cycles 11/11/21   [name], MD   lidocaine-prilocaine (EMLA) cream Apply to affected area once 11/11/21   [name], MD  ondansetron (ZOFRAN) 8 MG tablet Take 1 tablet (8 mg total) by mouth every 8 (eight) hours as needed for refractory nausea / vomiting. Start on day 3 after carboplatin chemo. 11/11/21   [name], MD  oxyCODONE (OXY IR/ROXICODONE) 5 MG immediate release tablet Take 1 tablet (5 mg total) by mouth every 4 (four) hours as needed for severe pain. For AFTER surgery only, do not take and drive 06/04/58   Cross, Lenna Sciara D, NP  polyethylene glycol (MIRALAX / GLYCOLAX) 17 g packet Take 17 g by mouth daily.    [provider]  prochlorperazine (COMPAZINE) 10 MG tablet Take 1 tablet (10 mg total) by mouth every 6 (six) hours as needed (Nausea or vomiting). 11/11/21   [name], MD  senna-docusate (SENOKOT-S) 8.6-50 MG tablet Take 2 tablets by mouth at bedtime. For AFTER surgery, do not take if having diarrhea 10/26/21   Dorothyann Gibbs, NP     Family History  Problem Relation Age of Onset   Varicose Veins Mother    Cardiomyopathy Mother    Cancer Father        prostate   Diabetes Father    Kidney cancer Brother    Arrhythmia Brother    Colon cancer Neg Hx    Breast cancer Neg Hx    Ovarian cancer Neg Hx    Endometrial cancer Neg Hx    Pancreatic cancer Neg Hx     Social History   Socioeconomic History   Marital status: Widowed    Spouse name: Wouter   Number of children: 3   Years of education: 14   Highest education level: Not on file  Occupational History    Comment: retired, travel agent  Tobacco Use   Smoking status: Former    Packs/day: 0.25    Years: 45.00    Total pack years: 11.25    Types: Cigarettes    Quit date: 06/10/2016    Years since quitting: 5.4   Smokeless tobacco: Never   Tobacco comments:    Occasionally tobacco use - 1-2 cig a week  Vaping Use   Vaping Use: Never used  Substance and Sexual Activity   Alcohol use: Yes    Alcohol/week: 7.0 standard drinks of alcohol     Types: 7 Glasses of wine per week    Comment: glass of wine 3-4 days per wek   Drug use: No   Sexual activity: Not on file  Other Topics Concern   Not on file  Social History Narrative   Lives at home w/husband   Caffeine- coffee, 2 cups daily   Social Determinants of Health   Financial Resource Strain: Not on file  Food Insecurity: Not on file  Transportation Needs: Not on file  Physical Activity: Not on file  Stress: Not on file  Social Connections: Not on file      Review of Systems denies fever, headache, chest pain, dyspnea, cough, back pain, nausea, vomiting or bleeding.  She  does have some abdominal bloating/discomfort  Vital Signs: BP 130/66   Pulse 64   Temp 98.4 F (36.9 C) (Oral)   Resp 16   SpO2 94%      Physical Exam awake, answering questions okay.  Chest with distant breath sounds bilaterally.  Heart with normal rate, occ ectopy.  Abdomen soft, mild distention, mild diffuse tenderness to palpation.  Small amount of pretibial edema bilaterally  Imaging: CT Abdomen Pelvis W Contrast  Result Date: 10/22/2021 CLINICAL DATA:  Left lower quadrant abdominal pain EXAM: CT ABDOMEN AND PELVIS WITH CONTRAST TECHNIQUE: Multidetector CT imaging of the abdomen and pelvis was performed using the standard protocol following bolus administration of intravenous contrast. RADIATION DOSE REDUCTION: This exam was performed according to the departmental dose-optimization program which includes automated exposure control, adjustment of the mA and/or kV according to patient size and/or use of iterative reconstruction technique. CONTRAST:  186m OMNIPAQUE IOHEXOL 300 MG/ML  SOLN COMPARISON:  CT abdomen and pelvis 10/01/2021; MRI pelvis 09/03/2021 FINDINGS: Lower chest: Scarring/atelectasis in the bilateral lower lobes and lingula. Hepatobiliary: No focal liver abnormality is seen. Status post cholecystectomy. Similar dilation of the common bile duct measuring 12 mm likely related to  post cholecystectomy state. Pancreas: Redemonstrated 13 mm low density area in the distal body of the pancreas favored to represent a side branch IPMN. Dilated main pancreatic duct measuring 6 mm in maximum diameter, unchanged. No active inflammatory change. Spleen: Normal in size without focal abnormality. Adrenals/Urinary Tract: Horseshoe kidney. Unchanged simple appearing renal cysts. No urinary calculi or hydronephrosis. Decompressed bladder. Unremarkable adrenal glands. Stomach/Bowel: Stomach is within normal limits. Appendectomy. No evidence of bowel wall thickening, distention, or inflammatory changes. Vascular/Lymphatic: Moderate aortoiliac atherosclerotic disease. Reproductive: There is a 8 x 11 x 14 mixed cystic and solid mass within the pelvis superior to the uterus concerning for malignancy possibly arising from the right ovary. This is slightly increased in size from CT 10/01/2021 when it measured approximately 7 x 9 x 13 cm using similar measuring technique. Other: New small volume perihepatic and perisplenic fluid tracking inferiorly into the pericolic gutters and pelvis. Musculoskeletal: Anterolisthesis L4 on L5. IMPRESSION: 1. Slightly increased size of the mixed cystic and solid mass presumably arising from the right ovary concerning for primary ovarian malignancy. 2. New small volume abdominopelvic ascites. 3. Horseshoe kidney 4.  Aortic Atherosclerosis (ICD10-I70.0). Electronically Signed   By: TPlacido SouM.D.   On: 10/22/2021 15:23   DG Chest Port 1 View  Result Date: 10/22/2021 CLINICAL DATA:  Abdominal pain with nausea today. EXAM: PORTABLE CHEST 1 VIEW COMPARISON:  Radiographs 11/04/2016.  CT 05/19/2020. FINDINGS: 1225 hours. The heart is mildly enlarged. There is aortic atherosclerosis. The lungs are clear. No pleural effusion or pneumothorax. Glenohumeral degenerative changes are present bilaterally with evidence of a chronic rotator cuff tear on the right. No acute osseous  findings. Telemetry leads overlie the chest. Probable loop recorder within the left upper anterior abdominal wall. IMPRESSION: No evidence of active cardiopulmonary process.  Cardiomegaly. Electronically Signed   By: WRichardean SaleM.D.   On: 10/22/2021 13:10    Labs:  CBC: Recent Labs    10/22/21 1201 11/03/21 1318  WBC 12.0* 10.5  HGB 15.7* 13.9  HCT 47.2* 42.4  PLT 507* 519*    COAGS: No results for input(s): "INR", "APTT" in the last 8760 hours.  BMP: Recent Labs    10/22/21 1201 11/03/21 1318  NA 140 139  K 3.9 4.3  CL 106 107  CO2 22 22  GLUCOSE 111* 100*  BUN 19 14  CALCIUM 8.8* 8.6*  CREATININE 0.97 0.74  GFRNONAA 57* >60    LIVER FUNCTION TESTS: Recent Labs    10/22/21 1201 11/03/21 1318  BILITOT 0.7 1.0  AST 23 24  ALT 15 15  ALKPHOS 91 85  PROT 6.7 6.6  ALBUMIN 3.5 3.4*    TUMOR MARKERS: No results for input(s): "AFPTM", "CEA", "CA199", "CHROMGRNA" in the last 8760 hours.  Assessment and Plan: 88 y.o. female with past medical history of atrial septal aneurysm, skin cancer, carotid artery disease, PSVT, hyperlipidemia, hypertension, mitral regurgitation, stroke, thoracic AAA, history of loop recorder who presents now with newly diagnosed right ovarian cancer.  She is scheduled today for Port-A-Cath placement to assist with treatment.Risks and benefits of image guided port-a-catheter placement was discussed with the patient /daughter including, but not limited to bleeding, infection, pneumothorax, or fibrin sheath development and need for additional procedures.  All of the patient's questions were answered, patient is agreeable to proceed. Consent signed and in chart.    Thank you for this interesting consult.  I greatly enjoyed meeting Regina James and look forward to participating in their care.  A copy of this report was sent to the requesting provider on this date.  Electronically Signed: D. Rowe Robert, PA-C 11/12/2021, 1:31 PM   I  spent a total of  20 minutes   in face to face in clinical consultation, greater than 50% of which was counseling/coordinating care for Port-A-Cath placement

## 2021-11-12 NOTE — Procedures (Signed)
Interventional Radiology Procedure Note  Procedure: Placement of a right IJ approach single lumen PowerPort.  Tip is positioned at the superior cavoatrial junction and catheter is ready for immediate use.  Complications: No immediate Recommendations:  - Ok to shower tomorrow - Do not submerge for 7 days - Routine line care   Signed,  Harvest Deist K. Karalynn Cottone, MD   

## 2021-11-12 NOTE — Telephone Encounter (Signed)
[employer] 2021-05 - Specimen Collection Study to Evaluate Biomarkers in Subjects with Cancer   Patient was referred by MD Alvy Bimler as potential EXACT study patient.  Called patient to let her know she's eligible and to introduce study, no answer.  ROI on file.  Left VM with contact information requesting call back if patient is interested.  Will attempt to f/u again tomorrow.  Wells Guiles 'Learta CoddingNeysa Bonito, RN, BSN Clinical Research Nurse I 11/12/21 12:47 PM

## 2021-11-13 ENCOUNTER — Telehealth: Payer: Self-pay | Admitting: Emergency Medicine

## 2021-11-13 NOTE — Telephone Encounter (Signed)
[employer] 2021-05 - Specimen Collection Study to Evaluate Biomarkers in Subjects with Cancer   Contacted patient to f/u on interest in study.  Patient has declined to participate at this time as she wants to proceed with standard of care and not have any additional appts/procedures.  Denies any questions or concerns at this time.  Wells Guiles 'Learta CoddingNeysa Bonito, RN, BSN Clinical Research Nurse I 11/13/21 4:05 PM

## 2021-11-16 ENCOUNTER — Inpatient Hospital Stay: Payer: Medicare Other

## 2021-11-16 ENCOUNTER — Other Ambulatory Visit: Payer: Self-pay

## 2021-11-16 DIAGNOSIS — C561 Malignant neoplasm of right ovary: Secondary | ICD-10-CM | POA: Diagnosis not present

## 2021-11-16 DIAGNOSIS — Z5111 Encounter for antineoplastic chemotherapy: Secondary | ICD-10-CM | POA: Diagnosis not present

## 2021-11-16 DIAGNOSIS — Z8051 Family history of malignant neoplasm of kidney: Secondary | ICD-10-CM | POA: Diagnosis not present

## 2021-11-16 DIAGNOSIS — I6930 Unspecified sequelae of cerebral infarction: Secondary | ICD-10-CM | POA: Diagnosis not present

## 2021-11-16 DIAGNOSIS — Z86718 Personal history of other venous thrombosis and embolism: Secondary | ICD-10-CM | POA: Diagnosis not present

## 2021-11-16 DIAGNOSIS — F17210 Nicotine dependence, cigarettes, uncomplicated: Secondary | ICD-10-CM | POA: Diagnosis not present

## 2021-11-16 LAB — CMP (CANCER CENTER ONLY)
ALT: 10 U/L (ref 0–44)
AST: 16 U/L (ref 15–41)
Albumin: 3.7 g/dL (ref 3.5–5.0)
Alkaline Phosphatase: 101 U/L (ref 38–126)
Anion gap: 5 (ref 5–15)
BUN: 8 mg/dL (ref 8–23)
CO2: 29 mmol/L (ref 22–32)
Calcium: 8.7 mg/dL — ABNORMAL LOW (ref 8.9–10.3)
Chloride: 105 mmol/L (ref 98–111)
Creatinine: 0.64 mg/dL (ref 0.44–1.00)
GFR, Estimated: >60 mL/min (ref >60)
Glucose, Bld: 97 mg/dL (ref 70–99)
Potassium: 3.8 mmol/L (ref 3.5–5.1)
Sodium: 139 mmol/L (ref 135–145)
Total Bilirubin: 0.6 mg/dL (ref 0.3–1.2)
Total Protein: 6.7 g/dL (ref 6.5–8.1)

## 2021-11-16 LAB — CBC WITH DIFFERENTIAL (CANCER CENTER ONLY)
Abs Immature Granulocytes: 0.05 10*3/uL (ref 0.00–0.07)
Basophils Absolute: 0.1 10*3/uL (ref 0.0–0.1)
Basophils Relative: 1 %
Eosinophils Absolute: 0.3 10*3/uL (ref 0.0–0.5)
Eosinophils Relative: 2 %
HCT: 39.8 % (ref 36.0–46.0)
Hemoglobin: 13.7 g/dL (ref 12.0–15.0)
Immature Granulocytes: 0 %
Lymphocytes Relative: 17 %
Lymphs Abs: 2.3 10*3/uL (ref 0.7–4.0)
MCH: 31.7 pg (ref 26.0–34.0)
MCHC: 34.4 g/dL (ref 30.0–36.0)
MCV: 92.1 fL (ref 80.0–100.0)
Monocytes Absolute: 1 10*3/uL (ref 0.1–1.0)
Monocytes Relative: 7 %
Neutro Abs: 9.7 10*3/uL — ABNORMAL HIGH (ref 1.7–7.7)
Neutrophils Relative %: 73 %
Platelet Count: 444 10*3/uL — ABNORMAL HIGH (ref 150–400)
RBC: 4.32 MIL/uL (ref 3.87–5.11)
RDW: 15.7 % — ABNORMAL HIGH (ref 11.5–15.5)
WBC Count: 13.3 10*3/uL — ABNORMAL HIGH (ref 4.0–10.5)
nRBC: 0 % (ref 0.0–0.2)

## 2021-11-16 MED ORDER — SODIUM CHLORIDE 0.9% FLUSH
10.0000 mL | Freq: Once | INTRAVENOUS | Status: AC
  Administered 2021-11-16: 10 mL

## 2021-11-16 MED ORDER — HEPARIN SOD (PORK) LOCK FLUSH 100 UNIT/ML IV SOLN
500.0000 [IU] | Freq: Once | INTRAVENOUS | Status: AC
  Administered 2021-11-16: 500 [IU]

## 2021-11-17 ENCOUNTER — Inpatient Hospital Stay: Payer: Medicare Other

## 2021-11-17 ENCOUNTER — Inpatient Hospital Stay: Payer: Medicare Other | Admitting: Licensed Clinical Social Worker

## 2021-11-17 VITALS — BP 146/72 | HR 67 | Temp 97.8°F | Resp 16 | Wt 166.5 lb

## 2021-11-17 DIAGNOSIS — F17210 Nicotine dependence, cigarettes, uncomplicated: Secondary | ICD-10-CM | POA: Diagnosis not present

## 2021-11-17 DIAGNOSIS — C561 Malignant neoplasm of right ovary: Secondary | ICD-10-CM

## 2021-11-17 DIAGNOSIS — I6930 Unspecified sequelae of cerebral infarction: Secondary | ICD-10-CM | POA: Diagnosis not present

## 2021-11-17 DIAGNOSIS — Z5111 Encounter for antineoplastic chemotherapy: Secondary | ICD-10-CM | POA: Diagnosis not present

## 2021-11-17 DIAGNOSIS — Z86718 Personal history of other venous thrombosis and embolism: Secondary | ICD-10-CM | POA: Diagnosis not present

## 2021-11-17 DIAGNOSIS — Z8051 Family history of malignant neoplasm of kidney: Secondary | ICD-10-CM | POA: Diagnosis not present

## 2021-11-17 LAB — CA 125: Cancer Antigen (CA) 125: 89.3 U/mL — ABNORMAL HIGH (ref 0.0–38.1)

## 2021-11-17 MED ORDER — SODIUM CHLORIDE 0.9 % IV SOLN: Freq: Once | INTRAVENOUS | Status: AC

## 2021-11-17 MED ORDER — PALONOSETRON HCL INJECTION 0.25 MG/5ML
0.2500 mg | Freq: Once | INTRAVENOUS | Status: AC
  Administered 2021-11-17: 0.25 mg via INTRAVENOUS

## 2021-11-17 MED ORDER — SODIUM CHLORIDE 0.9 % IV SOLN
175.0000 mg/m2 | Freq: Once | INTRAVENOUS | Status: AC
  Administered 2021-11-17: 330 mg via INTRAVENOUS
  Filled 2021-11-17: qty 55

## 2021-11-17 MED ORDER — SODIUM CHLORIDE 0.9% FLUSH
10.0000 mL | INTRAVENOUS | Status: DC | PRN
  Administered 2021-11-17: 10 mL

## 2021-11-17 MED ORDER — HEPARIN SOD (PORK) LOCK FLUSH 100 UNIT/ML IV SOLN
500.0000 [IU] | Freq: Once | INTRAVENOUS | Status: AC | PRN
  Administered 2021-11-17: 500 [IU]

## 2021-11-17 MED ORDER — FAMOTIDINE IN NACL 20-0.9 MG/50ML-% IV SOLN
20.0000 mg | Freq: Once | INTRAVENOUS | Status: AC
  Administered 2021-11-17: 20 mg via INTRAVENOUS

## 2021-11-17 MED ORDER — CETIRIZINE HCL 10 MG/ML IV SOLN
10.0000 mg | Freq: Once | INTRAVENOUS | Status: AC
  Administered 2021-11-17: 10 mg via INTRAVENOUS
  Filled 2021-11-17: qty 1

## 2021-11-17 MED ORDER — SODIUM CHLORIDE 0.9 % IV SOLN
435.0000 mg | Freq: Once | INTRAVENOUS | Status: AC
  Administered 2021-11-17: 440 mg via INTRAVENOUS
  Filled 2021-11-17: qty 44

## 2021-11-17 MED ORDER — SODIUM CHLORIDE 0.9 % IV SOLN
150.0000 mg | Freq: Once | INTRAVENOUS | Status: AC
  Administered 2021-11-17: 150 mg via INTRAVENOUS
  Filled 2021-11-17: qty 150

## 2021-11-17 MED ORDER — SODIUM CHLORIDE 0.9 % IV SOLN
10.0000 mg | Freq: Once | INTRAVENOUS | Status: AC
  Administered 2021-11-17: 10 mg via INTRAVENOUS
  Filled 2021-11-17: qty 10

## 2021-11-17 NOTE — Progress Notes (Signed)
[facility]  Initial Assessment   Regina James is a 88 y.o. year old female accompanied by daughter, Regina James, for first infusion treatment. Clinical Social Work was referred by new patient protocol for assessment of psychosocial needs.   SDOH (Social Determinants of Health) assessments performed: Yes SDOH Interventions    Flowsheet Row Most Recent Value  SDOH Interventions   Food Insecurity Interventions Intervention Not Indicated  Financial Strain Interventions Intervention Not Indicated  Housing Interventions Intervention Not Indicated  Transportation Interventions Intervention Not Indicated       SDOH Screenings   Alcohol Screen: Not on file  Depression (ZSM2-7): Not on file  Financial Resource Strain: Low Risk  (11/17/2021)   Overall Financial Resource Strain (CARDIA)    Difficulty of Paying Living Expenses: Not very hard  Food Insecurity: No Food Insecurity (11/17/2021)   Hunger Vital Sign    Worried About Running Out of Food in the Last Year: Never true    Ran Out of Food in the Last Year: Never true  Housing: Low Risk  (11/17/2021)   Housing    Last Housing Risk Score: 0  Physical Activity: Not on file  Social Connections: Not on file  Stress: Not on file  Tobacco Use: Medium Risk (11/12/2021)   Patient History    Smoking Tobacco Use: Former    Smokeless Tobacco Use: Never    Passive Exposure: Not on file  Transportation Needs: No Transportation Needs (11/17/2021)   PRAPARE - Hydrologist (Medical): No    Lack of Transportation (Non-Medical): No     Distress Screen completed: No     No data to display            Family/Social Information:  Housing Arrangement: patient lives alone. Daughter, Regina James, lives 1.5 miles away Family members/support persons in your life? Family and Friends Transportation concerns: no, still drives herself. Has family help as needed  Employment: Retired .  Income source: Architectural technologist concerns: No Type of concern: None Food access concerns: no Religious or spiritual practice: Not known Services Currently in place:  n/a  Coping/ Adjustment to diagnosis: Patient understands treatment plan and what happens next? Is overwhelmed by information but is adjusting and has a general understanding of next steps Concerns about diagnosis and/or treatment: Overwhelmed by information Patient reported stressors: Adjusting to my illness Current coping skills/ strengths: Capable of independent living , Communication skills , and Supportive family/friends     SUMMARY: Current SDOH Barriers:  None at this time  Clinical Social Work Clinical Goal(s):  No clinical social work goals at this time  Interventions: Discussed common feeling and emotions when being diagnosed with cancer, and the importance of support during treatment Informed patient of the support team roles and support services at [facility] Provided Bee contact information and encouraged patient to call with any questions or concerns   Follow Up Plan: Patient will contact CSW with any support or resource needs Patient verbalizes understanding of plan: Yes    Regina James E Regina Frappier, LCSW

## 2021-11-17 NOTE — Patient Instructions (Signed)
[facility] ONCOLOGY  Discharge Instructions: Thank you for choosing [facility] to provide your oncology and hematology care.   If you have a lab appointment with the [facility], please go directly to the [facility] and check in at the registration area.   Wear comfortable clothing and clothing appropriate for easy access to any Portacath or PICC line.   We strive to give you quality time with your provider. You may need to reschedule your appointment if you arrive late (15 or more minutes).  Arriving late affects you and other patients whose appointments are after yours.  Also, if you miss three or more appointments without notifying the office, you may be dismissed from the clinic at the provider's discretion.      For prescription refill requests, have your pharmacy contact our office and allow 72 hours for refills to be completed.    Today you received the following chemotherapy and/or immunotherapy agents: Paclitaxel (taxol) & Carboplatin     To help prevent nausea and vomiting after your treatment, we encourage you to take your nausea medication as directed.  BELOW ARE SYMPTOMS THAT SHOULD BE REPORTED IMMEDIATELY: *FEVER GREATER THAN 100.4 F (38 C) OR HIGHER *CHILLS OR SWEATING *NAUSEA AND VOMITING THAT IS NOT CONTROLLED WITH YOUR NAUSEA MEDICATION *UNUSUAL SHORTNESS OF BREATH *UNUSUAL BRUISING OR BLEEDING *URINARY PROBLEMS (pain or burning when urinating, or frequent urination) *BOWEL PROBLEMS (unusual diarrhea, constipation, pain near the anus) TENDERNESS IN MOUTH AND THROAT WITH OR WITHOUT PRESENCE OF ULCERS (sore throat, sores in mouth, or a toothache) UNUSUAL RASH, SWELLING OR PAIN  UNUSUAL VAGINAL DISCHARGE OR ITCHING   Items with * indicate a potential emergency and should be followed up as soon as possible or go to the Emergency Department if any problems should occur.  Please show the CHEMOTHERAPY ALERT CARD or IMMUNOTHERAPY  ALERT CARD at check-in to the Emergency Department and triage nurse.  Should you have questions after your visit or need to cancel or reschedule your appointment, please contact [facility]  Dept: [phone]  and follow the prompts.  Office hours are 8:00 a.m. to 4:30 p.m. Monday - Friday. Please note that voicemails left after 4:00 p.m. may not be returned until the following business day.  We are closed weekends and major holidays. You have access to a nurse at all times for urgent questions. Please call the main number to the clinic Dept: [phone] and follow the prompts.   For any non-urgent questions, you may also contact your provider using MyChart. We now offer e-Visits for anyone 35 and older to request care online for non-urgent symptoms. For details visit [email or web address].   Also download the MyChart app! Go to the app store, search "MyChart", open the app, select [facility], and log in with your MyChart username and password.  Masks are optional in the cancer centers. If you would like for your care team to wear a mask while they are taking care of you, please let them know. You may have one support person who is at least 86 years old accompany you for your appointments.

## 2021-11-19 ENCOUNTER — Telehealth: Payer: Self-pay

## 2021-11-19 NOTE — Telephone Encounter (Signed)
Returned her call. She started having loose stools this morning and her daughter gave her 2 imodium tabs, stool are more formed now. She is able to eat and will try to eat 5-6 small meals. She is able to drink with no problems. She will push oral fluids. She is complaining of some aches all over. She took tylenol and feels better. She will call the office back for worsening symptoms and questions.

## 2021-11-19 NOTE — Telephone Encounter (Signed)
LM for patient that this nurse was calling to see how they were doing after their treatment. Please call back to Dr. Gorsuch's nurse at [phone] if they have any questions or concerns regarding the treatment. 

## 2021-11-19 NOTE — Telephone Encounter (Signed)
-----   Message from Anastasia Pall, RN sent at 11/17/2021  3:28 PM EDT ----- Regarding: 1st time Taxol/Carbo, being seen by Dr. Allen Derry, Regina James received her 1st Taxol/Carbo infusion today. She tolerated it well. Please follow-up with her tomorrow. Thank you.

## 2021-11-23 ENCOUNTER — Emergency Department (HOSPITAL_COMMUNITY): Payer: Medicare Other

## 2021-11-23 ENCOUNTER — Inpatient Hospital Stay (HOSPITAL_COMMUNITY)
Admission: EM | Admit: 2021-11-23 | Discharge: 2021-11-28 | DRG: 690 | Disposition: A | Discharge status: Skilled Nursing Facility | Payer: Medicare Other | Attending: Internal Medicine | Admitting: Internal Medicine

## 2021-11-23 ENCOUNTER — Other Ambulatory Visit: Payer: Self-pay

## 2021-11-23 ENCOUNTER — Telehealth: Payer: Self-pay

## 2021-11-23 ENCOUNTER — Other Ambulatory Visit: Payer: Self-pay | Admitting: Hematology and Oncology

## 2021-11-23 ENCOUNTER — Encounter (HOSPITAL_COMMUNITY): Payer: Self-pay | Admitting: Emergency Medicine

## 2021-11-23 DIAGNOSIS — I471 Supraventricular tachycardia: Secondary | ICD-10-CM | POA: Diagnosis not present

## 2021-11-23 DIAGNOSIS — Z8673 Personal history of transient ischemic attack (TIA), and cerebral infarction without residual deficits: Secondary | ICD-10-CM

## 2021-11-23 DIAGNOSIS — H2513 Age-related nuclear cataract, bilateral: Secondary | ICD-10-CM | POA: Diagnosis not present

## 2021-11-23 DIAGNOSIS — I10 Essential (primary) hypertension: Secondary | ICD-10-CM | POA: Diagnosis present

## 2021-11-23 DIAGNOSIS — E782 Mixed hyperlipidemia: Secondary | ICD-10-CM | POA: Diagnosis not present

## 2021-11-23 DIAGNOSIS — C569 Malignant neoplasm of unspecified ovary: Secondary | ICD-10-CM

## 2021-11-23 DIAGNOSIS — A419 Sepsis, unspecified organism: Secondary | ICD-10-CM

## 2021-11-23 DIAGNOSIS — C561 Malignant neoplasm of right ovary: Secondary | ICD-10-CM

## 2021-11-23 DIAGNOSIS — I493 Ventricular premature depolarization: Secondary | ICD-10-CM | POA: Diagnosis present

## 2021-11-23 DIAGNOSIS — I672 Cerebral atherosclerosis: Secondary | ICD-10-CM | POA: Diagnosis not present

## 2021-11-23 DIAGNOSIS — G4700 Insomnia, unspecified: Secondary | ICD-10-CM | POA: Diagnosis not present

## 2021-11-23 DIAGNOSIS — Z885 Allergy status to narcotic agent status: Secondary | ICD-10-CM | POA: Diagnosis not present

## 2021-11-23 DIAGNOSIS — Z833 Family history of diabetes mellitus: Secondary | ICD-10-CM

## 2021-11-23 DIAGNOSIS — R0602 Shortness of breath: Secondary | ICD-10-CM | POA: Diagnosis not present

## 2021-11-23 DIAGNOSIS — R4781 Slurred speech: Secondary | ICD-10-CM | POA: Diagnosis present

## 2021-11-23 DIAGNOSIS — I480 Paroxysmal atrial fibrillation: Secondary | ICD-10-CM | POA: Diagnosis present

## 2021-11-23 DIAGNOSIS — E8770 Fluid overload, unspecified: Secondary | ICD-10-CM | POA: Diagnosis not present

## 2021-11-23 DIAGNOSIS — R2689 Other abnormalities of gait and mobility: Secondary | ICD-10-CM | POA: Diagnosis not present

## 2021-11-23 DIAGNOSIS — R531 Weakness: Secondary | ICD-10-CM | POA: Diagnosis not present

## 2021-11-23 DIAGNOSIS — I639 Cerebral infarction, unspecified: Secondary | ICD-10-CM | POA: Diagnosis not present

## 2021-11-23 DIAGNOSIS — D701 Agranulocytosis secondary to cancer chemotherapy: Secondary | ICD-10-CM | POA: Diagnosis not present

## 2021-11-23 DIAGNOSIS — N390 Urinary tract infection, site not specified: Principal | ICD-10-CM | POA: Diagnosis present

## 2021-11-23 DIAGNOSIS — K760 Fatty (change of) liver, not elsewhere classified: Secondary | ICD-10-CM | POA: Diagnosis not present

## 2021-11-23 DIAGNOSIS — I712 Thoracic aortic aneurysm, without rupture, unspecified: Secondary | ICD-10-CM | POA: Diagnosis not present

## 2021-11-23 DIAGNOSIS — N136 Pyonephrosis: Secondary | ICD-10-CM | POA: Diagnosis not present

## 2021-11-23 DIAGNOSIS — Z9049 Acquired absence of other specified parts of digestive tract: Secondary | ICD-10-CM

## 2021-11-23 DIAGNOSIS — J811 Chronic pulmonary edema: Secondary | ICD-10-CM | POA: Diagnosis not present

## 2021-11-23 DIAGNOSIS — N1339 Other hydronephrosis: Secondary | ICD-10-CM | POA: Diagnosis not present

## 2021-11-23 DIAGNOSIS — E876 Hypokalemia: Secondary | ICD-10-CM | POA: Diagnosis not present

## 2021-11-23 DIAGNOSIS — Z7401 Bed confinement status: Secondary | ICD-10-CM | POA: Diagnosis not present

## 2021-11-23 DIAGNOSIS — Z888 Allergy status to other drugs, medicaments and biological substances status: Secondary | ICD-10-CM | POA: Diagnosis not present

## 2021-11-23 DIAGNOSIS — Z7982 Long term (current) use of aspirin: Secondary | ICD-10-CM | POA: Diagnosis not present

## 2021-11-23 DIAGNOSIS — Z720 Tobacco use: Secondary | ICD-10-CM | POA: Diagnosis not present

## 2021-11-23 DIAGNOSIS — R42 Dizziness and giddiness: Secondary | ICD-10-CM | POA: Diagnosis not present

## 2021-11-23 DIAGNOSIS — Z8051 Family history of malignant neoplasm of kidney: Secondary | ICD-10-CM | POA: Diagnosis not present

## 2021-11-23 DIAGNOSIS — E569 Vitamin deficiency, unspecified: Secondary | ICD-10-CM | POA: Diagnosis not present

## 2021-11-23 DIAGNOSIS — E785 Hyperlipidemia, unspecified: Secondary | ICD-10-CM | POA: Diagnosis not present

## 2021-11-23 DIAGNOSIS — N3001 Acute cystitis with hematuria: Secondary | ICD-10-CM

## 2021-11-23 DIAGNOSIS — E860 Dehydration: Secondary | ICD-10-CM | POA: Diagnosis not present

## 2021-11-23 DIAGNOSIS — D62 Acute posthemorrhagic anemia: Secondary | ICD-10-CM | POA: Diagnosis not present

## 2021-11-23 DIAGNOSIS — R1084 Generalized abdominal pain: Secondary | ICD-10-CM | POA: Diagnosis not present

## 2021-11-23 DIAGNOSIS — I959 Hypotension, unspecified: Secondary | ICD-10-CM | POA: Diagnosis not present

## 2021-11-23 DIAGNOSIS — Z8249 Family history of ischemic heart disease and other diseases of the circulatory system: Secondary | ICD-10-CM | POA: Diagnosis not present

## 2021-11-23 DIAGNOSIS — Z79899 Other long term (current) drug therapy: Secondary | ICD-10-CM

## 2021-11-23 DIAGNOSIS — E861 Hypovolemia: Secondary | ICD-10-CM | POA: Diagnosis present

## 2021-11-23 DIAGNOSIS — Z85828 Personal history of other malignant neoplasm of skin: Secondary | ICD-10-CM | POA: Diagnosis not present

## 2021-11-23 DIAGNOSIS — N1330 Unspecified hydronephrosis: Secondary | ICD-10-CM | POA: Diagnosis not present

## 2021-11-23 DIAGNOSIS — R2681 Unsteadiness on feet: Secondary | ICD-10-CM | POA: Diagnosis not present

## 2021-11-23 DIAGNOSIS — D84821 Immunodeficiency due to drugs: Secondary | ICD-10-CM | POA: Diagnosis present

## 2021-11-23 DIAGNOSIS — R1900 Intra-abdominal and pelvic swelling, mass and lump, unspecified site: Secondary | ICD-10-CM | POA: Diagnosis not present

## 2021-11-23 DIAGNOSIS — T451X5A Adverse effect of antineoplastic and immunosuppressive drugs, initial encounter: Secondary | ICD-10-CM | POA: Diagnosis present

## 2021-11-23 DIAGNOSIS — I6522 Occlusion and stenosis of left carotid artery: Secondary | ICD-10-CM | POA: Diagnosis not present

## 2021-11-23 DIAGNOSIS — M6281 Muscle weakness (generalized): Secondary | ICD-10-CM | POA: Diagnosis not present

## 2021-11-23 DIAGNOSIS — I69351 Hemiplegia and hemiparesis following cerebral infarction affecting right dominant side: Secondary | ICD-10-CM | POA: Diagnosis not present

## 2021-11-23 DIAGNOSIS — R0603 Acute respiratory distress: Secondary | ICD-10-CM | POA: Diagnosis not present

## 2021-11-23 DIAGNOSIS — R9431 Abnormal electrocardiogram [ECG] [EKG]: Secondary | ICD-10-CM | POA: Diagnosis not present

## 2021-11-23 DIAGNOSIS — R4182 Altered mental status, unspecified: Secondary | ICD-10-CM | POA: Diagnosis not present

## 2021-11-23 DIAGNOSIS — E869 Volume depletion, unspecified: Secondary | ICD-10-CM | POA: Diagnosis present

## 2021-11-23 DIAGNOSIS — F17210 Nicotine dependence, cigarettes, uncomplicated: Secondary | ICD-10-CM | POA: Diagnosis present

## 2021-11-23 HISTORY — DX: Malignant neoplasm of right ovary: C56.1

## 2021-11-23 LAB — CBC WITH DIFFERENTIAL/PLATELET
Abs Immature Granulocytes: 0.01 10*3/uL (ref 0.00–0.07)
Basophils Absolute: 0 10*3/uL (ref 0.0–0.1)
Basophils Relative: 2 %
Eosinophils Absolute: 0.1 10*3/uL (ref 0.0–0.5)
Eosinophils Relative: 6 %
HCT: 36.7 % (ref 36.0–46.0)
Hemoglobin: 12.4 g/dL (ref 12.0–15.0)
Immature Granulocytes: 1 %
Lymphocytes Relative: 45 %
Lymphs Abs: 0.9 10*3/uL (ref 0.7–4.0)
MCH: 31.3 pg (ref 26.0–34.0)
MCHC: 33.8 g/dL (ref 30.0–36.0)
MCV: 92.7 fL (ref 80.0–100.0)
Monocytes Absolute: 0.1 10*3/uL (ref 0.1–1.0)
Monocytes Relative: 7 %
Neutro Abs: 0.7 10*3/uL — ABNORMAL LOW (ref 1.7–7.7)
Neutrophils Relative %: 39 %
Platelets: 208 10*3/uL (ref 150–400)
RBC: 3.96 MIL/uL (ref 3.87–5.11)
RDW: 15.1 % (ref 11.5–15.5)
WBC: 1.9 10*3/uL — ABNORMAL LOW (ref 4.0–10.5)
nRBC: 0 % (ref 0.0–0.2)

## 2021-11-23 LAB — URINALYSIS, ROUTINE W REFLEX MICROSCOPIC
Bilirubin Urine: NEGATIVE
Glucose, UA: NEGATIVE mg/dL
Ketones, ur: 5 mg/dL — AB
Nitrite: NEGATIVE
Protein, ur: NEGATIVE mg/dL
RBC / HPF: >50 RBC/hpf — ABNORMAL HIGH (ref 0–5)
Specific Gravity, Urine: 1.008 (ref 1.005–1.030)
pH: 8 (ref 5.0–8.0)

## 2021-11-23 LAB — COMPREHENSIVE METABOLIC PANEL
ALT: 45 U/L — ABNORMAL HIGH (ref 0–44)
AST: 50 U/L — ABNORMAL HIGH (ref 15–41)
Albumin: 3 g/dL — ABNORMAL LOW (ref 3.5–5.0)
Alkaline Phosphatase: 94 U/L (ref 38–126)
Anion gap: 9 (ref 5–15)
BUN: 14 mg/dL (ref 8–23)
CO2: 26 mmol/L (ref 22–32)
Calcium: 8.2 mg/dL — ABNORMAL LOW (ref 8.9–10.3)
Chloride: 102 mmol/L (ref 98–111)
Creatinine, Ser: 0.73 mg/dL (ref 0.44–1.00)
GFR, Estimated: >60 mL/min (ref >60)
Glucose, Bld: 106 mg/dL — ABNORMAL HIGH (ref 70–99)
Potassium: 3.6 mmol/L (ref 3.5–5.1)
Sodium: 137 mmol/L (ref 135–145)
Total Bilirubin: 0.9 mg/dL (ref 0.3–1.2)
Total Protein: 6.1 g/dL — ABNORMAL LOW (ref 6.5–8.1)

## 2021-11-23 MED ORDER — ONDANSETRON HCL 4 MG PO TABS: 4.0000 mg | ORAL_TABLET | Freq: Four times a day (QID) | ORAL | Status: DC | PRN

## 2021-11-23 MED ORDER — LACTATED RINGERS IV BOLUS
1000.0000 mL | Freq: Once | INTRAVENOUS | Status: AC
  Administered 2021-11-23: 1000 mL via INTRAVENOUS

## 2021-11-23 MED ORDER — SODIUM CHLORIDE 0.9 % IV SOLN
1.0000 g | INTRAVENOUS | Status: DC
  Administered 2021-11-24 – 2021-11-25 (×2): 1 g via INTRAVENOUS
  Filled 2021-11-23 (×3): qty 10

## 2021-11-23 MED ORDER — ONDANSETRON HCL 4 MG/2ML IJ SOLN: 4.0000 mg | Freq: Four times a day (QID) | INTRAMUSCULAR | Status: DC | PRN

## 2021-11-23 MED ORDER — ENOXAPARIN SODIUM 40 MG/0.4ML IJ SOSY
40.0000 mg | PREFILLED_SYRINGE | INTRAMUSCULAR | Status: DC
  Administered 2021-11-23 – 2021-11-27 (×5): 40 mg via SUBCUTANEOUS
  Filled 2021-11-23 (×5): qty 0.4

## 2021-11-23 MED ORDER — ACETAMINOPHEN 325 MG PO TABS
650.0000 mg | ORAL_TABLET | Freq: Once | ORAL | Status: AC
Start: 2021-11-23 — End: 2021-11-23
  Administered 2021-11-23: 650 mg via ORAL
  Filled 2021-11-23: qty 2

## 2021-11-23 MED ORDER — LACTATED RINGERS IV SOLN: INTRAVENOUS | Status: AC

## 2021-11-23 MED ORDER — ASPIRIN 325 MG PO TABS
325.0000 mg | ORAL_TABLET | Freq: Every day | ORAL | Status: DC
  Administered 2021-11-24 – 2021-11-28 (×5): 325 mg via ORAL
  Filled 2021-11-23 (×5): qty 1

## 2021-11-23 MED ORDER — SODIUM CHLORIDE 0.9 % IV SOLN
1.0000 g | Freq: Once | INTRAVENOUS | Status: AC
  Administered 2021-11-23: 1 g via INTRAVENOUS
  Filled 2021-11-23: qty 10

## 2021-11-23 MED ORDER — ACETAMINOPHEN 650 MG RE SUPP: 650.0000 mg | Freq: Four times a day (QID) | RECTAL | Status: DC | PRN

## 2021-11-23 MED ORDER — IOHEXOL 300 MG/ML  SOLN
100.0000 mL | Freq: Once | INTRAMUSCULAR | Status: AC | PRN
  Administered 2021-11-23: 100 mL via INTRAVENOUS

## 2021-11-23 MED ORDER — PRAVASTATIN SODIUM 20 MG PO TABS
20.0000 mg | ORAL_TABLET | Freq: Every day | ORAL | Status: DC
  Administered 2021-11-24 – 2021-11-27 (×4): 20 mg via ORAL
  Filled 2021-11-23 (×5): qty 1

## 2021-11-23 MED ORDER — CHLORHEXIDINE GLUCONATE CLOTH 2 % EX PADS
6.0000 | MEDICATED_PAD | Freq: Every day | CUTANEOUS | Status: DC
  Administered 2021-11-24 – 2021-11-27 (×4): 6 via TOPICAL

## 2021-11-23 MED ORDER — SODIUM CHLORIDE 0.9% FLUSH
10.0000 mL | Freq: Two times a day (BID) | INTRAVENOUS | Status: DC
  Administered 2021-11-23 – 2021-11-27 (×4): 10 mL

## 2021-11-23 MED ORDER — SODIUM CHLORIDE 0.9% FLUSH: 10.0000 mL | INTRAVENOUS | Status: DC | PRN

## 2021-11-23 MED ORDER — ACETAMINOPHEN 325 MG PO TABS
650.0000 mg | ORAL_TABLET | Freq: Four times a day (QID) | ORAL | Status: DC | PRN
  Administered 2021-11-24 – 2021-11-28 (×9): 650 mg via ORAL
  Filled 2021-11-23 (×9): qty 2

## 2021-11-23 NOTE — Assessment & Plan Note (Signed)
Continue aspirin and pravastatin 

## 2021-11-23 NOTE — Hospital Course (Signed)
Regina James is a 88 y.o. female with medical history significant for right ovarian epithelial cancer on active chemotherapy with carboplatin and paclitaxel, history of embolic CVA, HTN, HLD who is admitted with UTI and volume depletion associated with leukopenia in setting of recent initiation of chemotherapy.

## 2021-11-23 NOTE — Assessment & Plan Note (Signed)
Hold home Toprol-XL for now with hypovolemia.

## 2021-11-23 NOTE — Assessment & Plan Note (Signed)
Leukopenia due to chemotherapy Urinalysis concerning for UTI.  Patient leukopenic with WBC 1.9 and ANC 700 and setting of chemotherapy use. -Continue IV ceftriaxone -Follow urine and blood cultures

## 2021-11-23 NOTE — Telephone Encounter (Signed)
Called and spoke with daughter regarding after hours call to the answering service yesterday. Her Mom was having issues with speech and unable to speak yesterday. Per daughter she was fine when she saw her and did not go to the ER as instructed per the call service.  She just called 911 for concerning complaints and they are going to [facility].  Given above message to Dr. Alvy Bimler.

## 2021-11-23 NOTE — Progress Notes (Signed)
ON PATHWAY REGIMEN - Ovarian  No Change  Continue With Treatment as Ordered.  Original Decision Date/Time: 11/10/2021 10:23     A cycle is every 21 days:     Paclitaxel      Carboplatin   **Always confirm dose/schedule in your pharmacy ordering system**  Patient Characteristics: Preoperative or Nonsurgical Candidate (Clinical Staging), Newly Diagnosed, Neoadjuvant Therapy followed by Surgery BRCA Mutation Status: Awaiting Test Results Therapeutic Status: Preoperative or Nonsurgical Candidate (Clinical Staging) AJCC T Category: cT3c AJCC 8 Stage Grouping: IIIC AJCC N Category: cN0 AJCC M Category: cM0 Therapy Plan: Neoadjuvant Therapy followed by Surgery Intent of Therapy: Curative Intent, Discussed with Patient

## 2021-11-23 NOTE — H&P (Signed)
History and Physical    Regina James FSF:[id number] DOB: Sep 07, 1933 DOA: 11/23/2021  PCP: Kathyrn Lass, MD  Patient coming from: Home via EMS  I have personally briefly reviewed patient's old medical records in [place]  Chief Complaint: Dizziness, abdominal pain, generalized weakness  HPI: Regina James is a 88 y.o. female with medical history significant for right ovarian epithelial cancer on active chemotherapy with carboplatin and paclitaxel, history of embolic CVA, HTN, HLD who presented to the ED for evaluation of abdominal pain with nausea.  Patient recently started chemotherapy with carboplatin and paclitaxel on 11/17/2028.  She says she did well the first day, however each subsequent day she has been feeling worse and worse.  She has had low appetite and has not eaten much in the last 3 days.  She has noted lightheadedness/dizziness, worse with positional change.  She has intermittent abdominal pain related to her ovarian mass.  She also reports occasional difficulty with urination/incontinence due to mass effect on her bladder.    Earlier today she was having difficulty speaking with some slurring.  She has been feeling generally weak.  She also reports watery diarrhea during this time.  She is feeling dehydrated.  ED Course  Labs/Imaging on admission: I have personally reviewed following labs and imaging studies.  Initial vitals showed BP 103/72, pulse 90, RR 18, temp 99.0 F, SPO2 97% on room air.  Tmax while in the ED 100.1 F.  Labs show WBC 1.9, ANC 700, hemoglobin 12.4, platelets 208,000, sodium 137, potassium 3.6, bicarb 26, BUN 14, creatinine 0.73, serum glucose 106, AST 50, ALT 45, alk phos 94, total bilirubin 0.9.  Urinalysis shows negative nitrates, moderate leukocytes, >50 RBC/hpf, 21-50 WBC/hpf, many bacteria microscopy.  Blood and urine cultures in process.  CT head without contrast negative for acute intracranial abnormality.  Moderate chronic small  vessel ischemic disease noted.  CT abdomen/pelvis with contrast shows large cystic and solid mass in the right paramidline anatomic pelvis compatible with ovarian carcinoma.  Indeterminate 5 mm lateral left abdominal peritoneal nodule seen.  Mild right hydronephrosis likely secondary to compression by the ovarian mass.  Hepatic steatosis, trace right pleural effusion also noted.  Patient was given 1 L LR and IV ceftriaxone.  The hospitalist service was consulted to admit for further evaluation and management.  Review of Systems: All systems reviewed and are negative except as documented in history of present illness above.   Past Medical History:  Diagnosis Date   Atrial septal aneurysm    Cancer ([place])    basal cell carcinoma   Carotid artery disease ([place])    History of PSVT (paroxysmal supraventricular tachycardia)    Reported back as far as 2010.   Hyperlipidemia    Hypertension    Mitral regurgitation    Palpitations    Penetrating ulcer of aorta (HCC)    Right ovarian epithelial cancer (HCC)    Stroke ([place])    x 3 cannot write as quickly as used to   Thoracic aortic aneurysm [facility])     Past Surgical History:  Procedure Laterality Date   APPENDECTOMY  1993   CERVICAL BIOPSY  W/ LOOP ELECTRODE EXCISION     CHOLECYSTECTOMY  1996   ENDARTERECTOMY Left 11/03/2016   Procedure: Exploration of Carotid  ENDARTERECTOMY.;  Surgeon: Elam Dutch, MD;  Location: [facility] OR;  Service: Vascular;  Laterality: Left;   ENDARTERECTOMY Left 11/03/2016   Procedure: ENDARTERECTOMY CAROTID;  Surgeon: Elam Dutch, MD;  Location:  [facility] OR;  Service: Vascular;  Laterality: Left;   IR IMAGING GUIDED PORT INSERTION  11/12/2021   LAPAROSCOPY N/A 11/03/2021   Procedure: DIAGNOSTIC LAPAROSCOPY WITH BIOPSY;  Surgeon: Lafonda Mosses, MD;  Location: [facility] ORS;  Service: Gynecology;  Laterality: N/A;   LOOP RECORDER INSERTION N/A 11/02/2016   Procedure: Loop Recorder Insertion;  Surgeon: [name],  MD;  Location: [facility] LAB;  Service: Cardiovascular;  Laterality: N/A;   ROTATOR CUFF REPAIR Right 1990   TEE WITHOUT CARDIOVERSION N/A 11/02/2016   Procedure: TRANSESOPHAGEAL ECHOCARDIOGRAM (TEE);  Surgeon: Lelon Perla, MD;  Location[facility] ENDOSCOPY;  Service: Cardiovascular;  Laterality: N/A;   US ECHOCARDIOGRAPHY  01/09/2009   EF 55-60%   WRIST FRACTURE SURGERY      Social History:  reports that she quit smoking about 5 years ago. Her smoking use included cigarettes. She has a 11.25 pack-year smoking history. She has never used smokeless tobacco. She reports current alcohol use of about 7.0 standard drinks of alcohol per week. She reports that she does not use drugs.  Allergies  Allergen Reactions   Losartan Anaphylaxis    Per daughter and Patient, states that provider suggested she is having allergic reaction to medication   Dorzolamide Hcl-Timolol Mal Itching    Made vision very blurry and itching.Eyes very red.   Morphine And Related Swelling   Levofloxacin Anxiety and Other (See Comments)    Double vision    Family History  Problem Relation Age of Onset   Varicose Veins Mother    Cardiomyopathy Mother    Cancer Father        prostate   Diabetes Father    Kidney cancer Brother    Arrhythmia Brother    Colon cancer Neg Hx    Breast cancer Neg Hx    Ovarian cancer Neg Hx    Endometrial cancer Neg Hx    Pancreatic cancer Neg Hx      Prior to Admission medications   Medication Sig Start Date End Date Taking? Authorizing Provider  aspirin 325 MG tablet Take 1 tablet (325 mg total) by mouth daily. 11/10/16  Yes Alvia Grove, PA-C  cholecalciferol (VITAMIN D3) 25 MCG (1000 UNIT) tablet Take 1,000 Units by mouth daily.   Yes [provider]  dexamethasone (DECADRON) 4 MG tablet Take 2 tabs at the night before and 2 tab the morning of chemotherapy, every 3 weeks, by mouth x 6 cycles 11/11/21  Yes Gorsuch, Ni, MD  doxylamine, Sleep, (SLEEP AID) 25 MG  tablet Take 50 mg by mouth at bedtime.   Yes [provider]  latanoprost (XALATAN) 0.005 % ophthalmic solution Place 1 drop into the left eye at bedtime. 10/04/21  Yes [provider]  metoprolol succinate (TOPROL-XL) 100 MG 24 hr tablet Take 1 tablet (100 mg total) by mouth daily. Schedule an appointment for further refills, 2nd attempt Patient taking differently: Take 50 mg by mouth daily. 08/26/21  Yes Hilty, Nadean Corwin, MD  Polyethyl Glycol-Propyl Glycol (SYSTANE) 0.4-0.3 % SOLN Place 1 drop into both eyes daily as needed for dry eyes.   Yes [provider]  pravastatin (PRAVACHOL) 20 MG tablet TAKE 1 TABLET AT 6PM. Patient taking differently: Take 20 mg by mouth daily. 12/05/19  Yes Hilty, Nadean Corwin, MD  timolol (TIMOPTIC) 0.5 % ophthalmic solution Place 1 drop into the left eye 2 (two) times daily. 01/07/20  Yes [provider]  prochlorperazine (COMPAZINE) 10 MG tablet Take 1 tablet (10  mg total) by mouth every 6 (six) hours as needed (Nausea or vomiting). 11/11/21 11/23/21 Yes Gorsuch, Ni, MD  oxyCODONE (OXY IR/ROXICODONE) 5 MG immediate release tablet Take 1 tablet (5 mg total) by mouth every 4 (four) hours as needed for severe pain. For AFTER surgery only, do not take and drive 0/81/44   Cross, Lenna Sciara D, NP  senna-docusate (SENOKOT-S) 8.6-50 MG tablet Take 2 tablets by mouth at bedtime. For AFTER surgery, do not take if having diarrhea Patient not taking: Reported on 11/23/2021 10/26/21   Joylene John D, NP    Physical Exam: Vitals:   11/23/21 1938 11/23/21 1945 11/23/21 2000 11/23/21 2040  BP: 107/66 113/69 104/72   Pulse: 86 (!) 103 96   Resp: (!) 25 15 (!) 25   Temp:    98.6 F (37 C)  TempSrc:    Oral  SpO2: 95% 96% 94%    Constitutional: Resting in bed, NAD, calm, comfortable Eyes: EOMI, lids and conjunctivae normal ENMT: Mucous membranes are dry. Posterior pharynx clear of any exudate or lesions.Normal dentition.  Neck: normal, supple, no  masses. Respiratory: clear to auscultation bilaterally, no wheezing, no crackles. Normal respiratory effort. No accessory muscle use.  Cardiovascular: Regular rate and rhythm, no murmurs / rubs / gallops. No extremity edema. 2+ pedal pulses. Abdomen: Mild right-sided and midline tenderness, mass palpated. Bowel sounds positive.  Musculoskeletal: no clubbing / cyanosis. No joint deformity upper and lower extremities. Good ROM, no contractures. Normal muscle tone.  Skin: no rashes, lesions, ulcers. No induration Neurologic: Sensation intact. Strength 5/5 in all 4.  Psychiatric: Normal judgment and insight. Alert and oriented x 3. Normal mood.   EKG: Personally reviewed. Sinus rhythm, rate 82, multiple PVCs.  Rate is faster when compared to prior.  Assessment/Plan Principal Problem:   UTI (urinary tract infection) Active Problems:   Volume depletion   Right ovarian epithelial cancer (HCC)   Leukopenia due to antineoplastic chemotherapy (HCC)   Paroxysmal SVT (supraventricular tachycardia) (HCC)   History of cerebrovascular accident (CVA) due to embolism   Regina James is a 88 y.o. female with medical history significant for right ovarian epithelial cancer on active chemotherapy with carboplatin and paclitaxel, history of embolic CVA, HTN, HLD who is admitted with UTI and volume depletion associated with leukopenia in setting of recent initiation of chemotherapy.  Assessment and Plan: * UTI (urinary tract infection) Leukopenia due to chemotherapy Urinalysis concerning for UTI.  Patient leukopenic with WBC 1.9 and ANC 700 and setting of chemotherapy use. -Continue IV ceftriaxone -Follow urine and blood cultures  Volume depletion Appears dehydrated related to GI losses and poor oral intake likely related to chemotherapy use. -Continue IV fluid hydration overnight  Right ovarian epithelial cancer [facility]) Follows with oncology, Dr. Alvy Bimler.  Recently started chemotherapy with carboplatin  and paclitaxel on 8/15.  She is concerned she is having poor tolerance to treatment.  History of cerebrovascular accident (CVA) due to embolism Continue aspirin and pravastatin.  Paroxysmal SVT (supraventricular tachycardia) ([place]) Hold home Toprol-XL for now with hypovolemia.  DVT prophylaxis: enoxaparin (LOVENOX) injection 40 mg Start: 11/23/21 2200 Code Status: Full code, confirmed with patient on admission Family Communication: Discussed with daughter at bedside Disposition Plan: From home and likely discharge to home pending clinical progress Consults called: None Severity of Illness: The appropriate patient status for this patient is OBSERVATION. Observation status is judged to be reasonable and necessary in order to provide the required intensity of service to ensure the patient's safety. The  patient's presenting symptoms, physical exam findings, and initial radiographic and laboratory data in the context of their medical condition is felt to place them at decreased risk for further clinical deterioration. Furthermore, it is anticipated that the patient will be medically stable for discharge from the hospital within 2 midnights of admission.   Zada Finders MD [facility]  If 7PM-7AM, please contact night-coverage [email or web address]  11/23/2021, 9:56 PM

## 2021-11-23 NOTE — ED Provider Notes (Signed)
[place] DEPT Provider Note   CSN: [id number] Arrival date & time: 11/23/21  1114     History  Chief Complaint  Patient presents with   Abdominal Pain    Regina James is a 88 y.o. female.  HPI 88 yo female recent diagnosis of ovarian ca, began chemo last Tuesday.  She has had severe nausea since the day after chemo, but no vomiting.  Decreased po intake due to nausea.  She reports 20 ounces of po water and increased uop.  No dysuria, fever, some chills loc, cough, chest pain,uri symptoms.  Patient given rx for nausea medicine, zofran with instructions to start on day 3 after chemo-patient unclear if taking. Today states her gait was shuffling as she felt off balance from awakening today.       Home Medications Prior to Admission medications   Medication Sig Start Date End Date Taking? Authorizing Provider  aspirin 325 MG tablet Take 1 tablet (325 mg total) by mouth daily. 11/10/16  Yes Alvia Grove, PA-C  cholecalciferol (VITAMIN D3) 25 MCG (1000 UNIT) tablet Take 1,000 Units by mouth daily.   Yes [provider]  dexamethasone (DECADRON) 4 MG tablet Take 2 tabs at the night before and 2 tab the morning of chemotherapy, every 3 weeks, by mouth x 6 cycles 11/11/21  Yes Gorsuch, Ni, MD  doxylamine, Sleep, (SLEEP AID) 25 MG tablet Take 50 mg by mouth at bedtime.   Yes [provider]  latanoprost (XALATAN) 0.005 % ophthalmic solution Place 1 drop into the left eye at bedtime. 10/04/21  Yes [provider]  metoprolol succinate (TOPROL-XL) 100 MG 24 hr tablet Take 1 tablet (100 mg total) by mouth daily. Schedule an appointment for further refills, 2nd attempt Patient taking differently: Take 50 mg by mouth daily. 08/26/21  Yes Hilty, Nadean Corwin, MD  ondansetron (ZOFRAN) 8 MG tablet Take 1 tablet (8 mg total) by mouth every 8 (eight) hours as needed for refractory nausea / vomiting. Start on day 3 after carboplatin chemo. 11/11/21   Yes Gorsuch, Ni, MD  Polyethyl Glycol-Propyl Glycol (SYSTANE) 0.4-0.3 % SOLN Place 1 drop into both eyes daily as needed for dry eyes.   Yes [provider]  pravastatin (PRAVACHOL) 20 MG tablet TAKE 1 TABLET AT 6PM. Patient taking differently: Take 20 mg by mouth daily. 12/05/19  Yes Hilty, Nadean Corwin, MD  prochlorperazine (COMPAZINE) 10 MG tablet Take 1 tablet (10 mg total) by mouth every 6 (six) hours as needed (Nausea or vomiting). 11/11/21  Yes Gorsuch, Ni, MD  timolol (TIMOPTIC) 0.5 % ophthalmic solution Place 1 drop into the left eye 2 (two) times daily. 01/07/20  Yes [provider]  lidocaine-prilocaine (EMLA) cream Apply to affected area once 11/11/21   [name], MD  oxyCODONE (OXY IR/ROXICODONE) 5 MG immediate release tablet Take 1 tablet (5 mg total) by mouth every 4 (four) hours as needed for severe pain. For AFTER surgery only, do not take and drive 04/13/30   Cross, Lenna Sciara D, NP  senna-docusate (SENOKOT-S) 8.6-50 MG tablet Take 2 tablets by mouth at bedtime. For AFTER surgery, do not take if having diarrhea Patient not taking: Reported on 11/23/2021 10/26/21   Joylene John D, NP      Allergies    Losartan, Dorzolamide hcl-timolol mal, Morphine and related, and Levofloxacin    Review of Systems   Review of Systems  Physical Exam Updated Vital Signs BP (!) 178/93   Pulse 84  Temp 98.1 F (36.7 C) (Oral)   Resp 12   SpO2 96%  Physical Exam Vitals and nursing note reviewed.  Constitutional:      Appearance: She is well-developed.  HENT:     Head: Normocephalic.     Mouth/Throat:     Mouth: Mucous membranes are moist.  Eyes:     Extraocular Movements: Extraocular movements intact.  Cardiovascular:     Rate and Rhythm: Normal rate and regular rhythm.  Abdominal:     General: Abdomen is flat. Bowel sounds are normal.     Palpations: Abdomen is soft.     Tenderness: There is abdominal tenderness in the right lower quadrant, suprapubic area and left lower  quadrant.  Skin:    General: Skin is warm and dry.     Capillary Refill: Capillary refill takes less than 2 seconds.  Neurological:     General: No focal deficit present.     Mental Status: She is alert.     Cranial Nerves: Cranial nerves 2-12 are intact.     Sensory: Sensation is intact.     Motor: Motor function is intact.     Coordination: Romberg sign negative. Coordination normal. Finger-Nose-Finger Test abnormal. Heel to [facility] Test normal.     Deep Tendon Reflexes: Reflexes are normal and symmetric.  Psychiatric:        Mood and Affect: Mood normal.     ED Results / Procedures / Treatments   Labs (all labs ordered are listed, but only abnormal results are displayed) Labs Reviewed  CBC WITH DIFFERENTIAL/PLATELET - Abnormal; Notable for the following components:      Result Value   WBC 1.9 (*)    All other components within normal limits  COMPREHENSIVE METABOLIC PANEL - Abnormal; Notable for the following components:   Glucose, Bld 106 (*)    Calcium 8.2 (*)    Total Protein 6.1 (*)    Albumin 3.0 (*)    AST 50 (*)    ALT 45 (*)    All other components within normal limits  URINALYSIS, ROUTINE W REFLEX MICROSCOPIC - Abnormal; Notable for the following components:   APPearance HAZY (*)    Hgb urine dipstick LARGE (*)    Ketones, ur 5 (*)    Leukocytes,Ua MODERATE (*)    RBC / HPF >50 (*)    Bacteria, UA MANY (*)    All other components within normal limits  URINE CULTURE    EKG None  Radiology CT ABDOMEN PELVIS W CONTRAST  Result Date: 11/23/2021 CLINICAL DATA:  Abdominal pain.  * Tracking Code: BO * EXAM: CT ABDOMEN AND PELVIS WITH CONTRAST TECHNIQUE: Multidetector CT imaging of the abdomen and pelvis was performed using the standard protocol following bolus administration of intravenous contrast. RADIATION DOSE REDUCTION: This exam was performed according to the departmental dose-optimization program which includes automated exposure control, adjustment of the mA  and/or kV according to patient size and/or use of iterative reconstruction technique. CONTRAST:  179m OMNIPAQUE IOHEXOL 300 MG/ML  SOLN COMPARISON:  10/22/2021 and MR pelvis 10/03/2021. FINDINGS: Lower chest: Trace right pleural effusion with adjacent compressive atelectasis in the right lower lobe. Subsegmental volume loss in the left lower lobe. Atherosclerotic calcification of the aorta and coronary arteries. Catheter tip in the high right atrium. Heart is enlarged. No pericardial effusion. Distal esophagus is grossly unremarkable. Hepatobiliary: Liver is decreased in attenuation diffusely. Biliary ductal dilatation, likely related to cholecystectomy. Pancreas: Negative. Spleen: Negative. Adrenals/Urinary Tract: Adrenal glands are unremarkable.  Horseshoe kidney. 3.3 cm fluid density lesion in the right kidney, compatible with a cyst. No follow-up necessary. 1.6 cm low-attenuation lesion in the midline renal parenchyma, too small to characterize. No specific follow-up necessary. Mild right hydronephrosis, new from 10/22/2021, likely due to external compression by a large cystic and solid mass in the anatomic pelvis. Left ureter is decompressed. Bladder is low in volume. Stomach/Bowel: Stomach, small bowel and colon are unremarkable. Appendix is not readily visualized. Vascular/Lymphatic: Atherosclerotic calcification of the aorta. Retroaortic left renal vein. No pathologically enlarged lymph nodes. Small bowel mesenteric lymph nodes are not enlarged by CT size criteria. Reproductive: Large cystic and solid mass in the right paramidline anatomic pelvis, measuring 9.6 x 15.6 cm (2/67), as on 10/22/2021. Mass compresses the uterus inferiorly. Other: Trace pelvic free fluid. 5 mm peritoneal nodule adjacent to the proximal descending colon (2/29), unchanged and indeterminate. Mesenteries and peritoneum are otherwise unremarkable. Musculoskeletal: Degenerative changes in the spine and hips. Minimal grade 1  anterolisthesis of L4 on L5. IMPRESSION: 1. Large cystic and solid mass in the right paramidline anatomic pelvis, compatible with ovarian carcinoma. 2. 5 mm lateral left abdominal peritoneal nodule, indeterminate. Recommend attention on follow-up. 3. Mild right hydronephrosis, likely secondary to compression by the aforementioned ovarian mass. 4. Trace right pleural effusion. 5. Hepatic steatosis. 6. Aortic atherosclerosis (ICD10-I70.0). Coronary artery calcification. Electronically Signed   By: Lorin Picket M.D.   On: 11/23/2021 15:38   CT Head Wo Contrast  Result Date: 11/23/2021 CLINICAL DATA:  Neuro deficit, acute, stroke suspected. EXAM: CT HEAD WITHOUT CONTRAST TECHNIQUE: Contiguous axial images were obtained from the base of the skull through the vertex without intravenous contrast. RADIATION DOSE REDUCTION: This exam was performed according to the departmental dose-optimization program which includes automated exposure control, adjustment of the mA and/or kV according to patient size and/or use of iterative reconstruction technique. COMPARISON:  Head CT 06/12/2020 and MRI 11/04/2016 FINDINGS: Brain: There is no evidence of an acute infarct, intracranial hemorrhage, mass, midline shift, or extra-axial fluid collection. Small chronic cortical infarcts are again seen in the left frontal and left occipital lobes. Patchy hypodensities in the cerebral white matter bilaterally are unchanged and nonspecific but compatible with moderate chronic small vessel ischemic disease. There is mild cerebral atrophy. Vascular: Calcified atherosclerosis at the skull base. No hyperdense vessel. Skull: No fracture or suspicious osseous lesion. Sinuses/Orbits: Paranasal sinuses and mastoid air cells are clear. Bilateral cataract extraction. Other: None. IMPRESSION: 1. No evidence of acute intracranial abnormality. 2. Moderate chronic small vessel ischemic disease. Electronically Signed   By: Logan Bores M.D.   On:  11/23/2021 15:29    Procedures Procedures    Medications Ordered in ED Medications  sodium chloride flush (NS) 0.9 % injection 10-40 mL (has no administration in time range)  sodium chloride flush (NS) 0.9 % injection 10-40 mL (has no administration in time range)  Chlorhexidine Gluconate Cloth 2 % PADS 6 each (has no administration in time range)  cefTRIAXone (ROCEPHIN) 1 g in sodium chloride 0.9 % 100 mL IVPB (has no administration in time range)  lactated ringers bolus 1,000 mL (0 mLs Intravenous Stopped 11/23/21 1447)  iohexol (OMNIPAQUE) 300 MG/ML solution 100 mL (100 mLs Intravenous Contrast Given 11/23/21 1505)    ED Course/ Medical Decision Making/ A&P Clinical Course as of 11/23/21 1600  Mon Nov 23, 2021  1543 CBC reviewed interpreted and leukocytosis noted at 1900 neutrophil count pending [DR]  1543 Urinalysis reviewed interpreted and significant for greater than  50 red blood cells and many bacteria Plan culture and will give Rocephin [DR]  3007 CT reviewed interpreted and significant for large cystic and solid mass in the right paramidline anatomic pelvis compatible with ovarian carcinoma, 5 mm lateral left abdominal peritoneal nodule indeterminate Mild right hydronephrosis likely secondary to compression by ovarian mass although new from imaging of 720  [DR]  1545 CT head obtained and notes of acute abnormality noted [DR]    Clinical Course User Index [DR] Pattricia Boss, MD                           Medical Decision Making 88 yo female with recent diagnosis of ovarian cancer with recent chemo presents with generalized weakness, abdominal pain, Patient seen and evaluated here with labs and has leukopenia with white count of 1900.  Patient signed out to Dr. Campbell Stall Patient pending differential of CBC and will need to be evaluated if absolute neutrophil count is less than 500 will require hospitalization Once patient has received antibiotics she will need to be  ambulated and assessed gait. Patient did not have focal deficits on my exam did have feeling of being off balance.  Suspect this is secondary to her infectious symptoms.  Amount and/or Complexity of Data Reviewed Labs: ordered. Radiology: ordered.  Risk OTC drugs. Prescription drug management.           Final Clinical Impression(s) / ED Diagnoses Final diagnoses:  Urinary tract infection with hematuria, site unspecified  Malignant neoplasm of ovary, unspecified laterality [facility])    Rx / DC Orders ED Discharge Orders     None         Pattricia Boss, MD 11/23/21 1600

## 2021-11-23 NOTE — ED Triage Notes (Signed)
Ems brings pt in from home. Pt recently diagnosed with cancer and started chemo last week. Since then she has had increased abdominal pain, dizziness, generalized weakness, and decreased appetite.

## 2021-11-23 NOTE — Assessment & Plan Note (Signed)
Appears dehydrated related to GI losses and poor oral intake likely related to chemotherapy use. -Continue IV fluid hydration overnight

## 2021-11-23 NOTE — Assessment & Plan Note (Signed)
Follows with oncology, Dr. Alvy Bimler.  Recently started chemotherapy with carboplatin and paclitaxel on 8/15.  She is concerned she is having poor tolerance to treatment.

## 2021-11-23 NOTE — ED Provider Notes (Signed)
3:50 PM Patient signed out to me by previous ED physician. 88 yo female presenting for abdominal pain with dizziness.   Wbc 19 UA positive for UTI. Rocephin given. Culture sent. CT abd/pelvis demo:  Large cystic and solid mass in the right paramidline anatomic pelvis, compatible with ovarian carcinoma. Already on chemo  Abs. Neurotrophil count <5 admit otherwise DC if able to ambulate     Physical Exam  BP (!) 178/93   Pulse 84   Temp 98.1 F (36.7 C) (Oral)   Resp 12   SpO2 96%   Physical Exam  Procedures  Procedures  ED Course / MDM   Clinical Course as of 11/23/21 1550  Mon Nov 23, 2021  1543 CBC reviewed interpreted and leukocytosis noted at 1900 neutrophil count pending [DR]  1543 Urinalysis reviewed interpreted and significant for greater than 50 red blood cells and many bacteria Plan culture and will give Rocephin [DR]  2035 CT reviewed interpreted and significant for large cystic and solid mass in the right paramidline anatomic pelvis compatible with ovarian carcinoma, 5 mm lateral left abdominal peritoneal nodule indeterminate Mild right hydronephrosis likely secondary to compression by ovarian mass although new from imaging of 720  [DR]  1545 CT head obtained and notes of acute abnormality noted [DR]    Clinical Course User Index [DR] Pattricia Boss, MD   Medical Decision Making Amount and/or Complexity of Data Reviewed Labs: ordered. Radiology: ordered.  Risk OTC drugs. Prescription drug management.  7:12 PM Developed a fever throughout her stay at 100.1 F.  Current leukocytosis of 1.9.  Patient already given a gram of Rocephin.  CT scan did demonstrate hydronephrosis secondary to ovarian mass.  Concerns for developing sepsis. Blood cx sent. Patient accepted by admitting Dr. Zada Finders.        Lianne Cure, DO 59/74/16 [id number]

## 2021-11-24 ENCOUNTER — Inpatient Hospital Stay: Payer: Medicare Other | Admitting: Hematology and Oncology

## 2021-11-24 DIAGNOSIS — E782 Mixed hyperlipidemia: Secondary | ICD-10-CM | POA: Diagnosis not present

## 2021-11-24 DIAGNOSIS — K760 Fatty (change of) liver, not elsewhere classified: Secondary | ICD-10-CM | POA: Diagnosis present

## 2021-11-24 DIAGNOSIS — Z888 Allergy status to other drugs, medicaments and biological substances status: Secondary | ICD-10-CM | POA: Diagnosis not present

## 2021-11-24 DIAGNOSIS — R0603 Acute respiratory distress: Secondary | ICD-10-CM | POA: Diagnosis not present

## 2021-11-24 DIAGNOSIS — Z7982 Long term (current) use of aspirin: Secondary | ICD-10-CM | POA: Diagnosis not present

## 2021-11-24 DIAGNOSIS — R0602 Shortness of breath: Secondary | ICD-10-CM | POA: Diagnosis not present

## 2021-11-24 DIAGNOSIS — M6281 Muscle weakness (generalized): Secondary | ICD-10-CM | POA: Diagnosis not present

## 2021-11-24 DIAGNOSIS — R4182 Altered mental status, unspecified: Secondary | ICD-10-CM | POA: Diagnosis not present

## 2021-11-24 DIAGNOSIS — R2689 Other abnormalities of gait and mobility: Secondary | ICD-10-CM | POA: Diagnosis not present

## 2021-11-24 DIAGNOSIS — Z8673 Personal history of transient ischemic attack (TIA), and cerebral infarction without residual deficits: Secondary | ICD-10-CM | POA: Diagnosis not present

## 2021-11-24 DIAGNOSIS — R1900 Intra-abdominal and pelvic swelling, mass and lump, unspecified site: Secondary | ICD-10-CM | POA: Diagnosis not present

## 2021-11-24 DIAGNOSIS — N3001 Acute cystitis with hematuria: Secondary | ICD-10-CM | POA: Diagnosis not present

## 2021-11-24 DIAGNOSIS — E8770 Fluid overload, unspecified: Secondary | ICD-10-CM | POA: Diagnosis not present

## 2021-11-24 DIAGNOSIS — E876 Hypokalemia: Secondary | ICD-10-CM | POA: Diagnosis present

## 2021-11-24 DIAGNOSIS — H2513 Age-related nuclear cataract, bilateral: Secondary | ICD-10-CM | POA: Diagnosis not present

## 2021-11-24 DIAGNOSIS — I10 Essential (primary) hypertension: Secondary | ICD-10-CM | POA: Diagnosis present

## 2021-11-24 DIAGNOSIS — E569 Vitamin deficiency, unspecified: Secondary | ICD-10-CM | POA: Diagnosis not present

## 2021-11-24 DIAGNOSIS — N390 Urinary tract infection, site not specified: Secondary | ICD-10-CM | POA: Diagnosis present

## 2021-11-24 DIAGNOSIS — D62 Acute posthemorrhagic anemia: Secondary | ICD-10-CM | POA: Diagnosis not present

## 2021-11-24 DIAGNOSIS — Z8249 Family history of ischemic heart disease and other diseases of the circulatory system: Secondary | ICD-10-CM | POA: Diagnosis not present

## 2021-11-24 DIAGNOSIS — E860 Dehydration: Secondary | ICD-10-CM | POA: Diagnosis present

## 2021-11-24 DIAGNOSIS — Z833 Family history of diabetes mellitus: Secondary | ICD-10-CM | POA: Diagnosis not present

## 2021-11-24 DIAGNOSIS — Z8051 Family history of malignant neoplasm of kidney: Secondary | ICD-10-CM | POA: Diagnosis not present

## 2021-11-24 DIAGNOSIS — I712 Thoracic aortic aneurysm, without rupture, unspecified: Secondary | ICD-10-CM | POA: Diagnosis present

## 2021-11-24 DIAGNOSIS — N136 Pyonephrosis: Secondary | ICD-10-CM | POA: Diagnosis present

## 2021-11-24 DIAGNOSIS — Z9049 Acquired absence of other specified parts of digestive tract: Secondary | ICD-10-CM | POA: Diagnosis not present

## 2021-11-24 DIAGNOSIS — Z85828 Personal history of other malignant neoplasm of skin: Secondary | ICD-10-CM | POA: Diagnosis not present

## 2021-11-24 DIAGNOSIS — E861 Hypovolemia: Secondary | ICD-10-CM | POA: Diagnosis present

## 2021-11-24 DIAGNOSIS — C569 Malignant neoplasm of unspecified ovary: Secondary | ICD-10-CM | POA: Diagnosis not present

## 2021-11-24 DIAGNOSIS — I69351 Hemiplegia and hemiparesis following cerebral infarction affecting right dominant side: Secondary | ICD-10-CM | POA: Diagnosis not present

## 2021-11-24 DIAGNOSIS — I6522 Occlusion and stenosis of left carotid artery: Secondary | ICD-10-CM | POA: Diagnosis not present

## 2021-11-24 DIAGNOSIS — Z720 Tobacco use: Secondary | ICD-10-CM | POA: Diagnosis not present

## 2021-11-24 DIAGNOSIS — Z885 Allergy status to narcotic agent status: Secondary | ICD-10-CM | POA: Diagnosis not present

## 2021-11-24 DIAGNOSIS — D84821 Immunodeficiency due to drugs: Secondary | ICD-10-CM | POA: Diagnosis present

## 2021-11-24 DIAGNOSIS — C561 Malignant neoplasm of right ovary: Secondary | ICD-10-CM | POA: Diagnosis present

## 2021-11-24 DIAGNOSIS — D701 Agranulocytosis secondary to cancer chemotherapy: Secondary | ICD-10-CM | POA: Diagnosis present

## 2021-11-24 DIAGNOSIS — I471 Supraventricular tachycardia: Secondary | ICD-10-CM | POA: Diagnosis present

## 2021-11-24 DIAGNOSIS — J811 Chronic pulmonary edema: Secondary | ICD-10-CM | POA: Diagnosis not present

## 2021-11-24 DIAGNOSIS — E785 Hyperlipidemia, unspecified: Secondary | ICD-10-CM | POA: Diagnosis present

## 2021-11-24 DIAGNOSIS — T451X5A Adverse effect of antineoplastic and immunosuppressive drugs, initial encounter: Secondary | ICD-10-CM | POA: Diagnosis present

## 2021-11-24 DIAGNOSIS — I480 Paroxysmal atrial fibrillation: Secondary | ICD-10-CM | POA: Diagnosis present

## 2021-11-24 DIAGNOSIS — Z7401 Bed confinement status: Secondary | ICD-10-CM | POA: Diagnosis not present

## 2021-11-24 DIAGNOSIS — G4700 Insomnia, unspecified: Secondary | ICD-10-CM | POA: Diagnosis not present

## 2021-11-24 LAB — CBC
HCT: 37.6 % (ref 36.0–46.0)
Hemoglobin: 12.6 g/dL (ref 12.0–15.0)
MCH: 30.7 pg (ref 26.0–34.0)
MCHC: 33.5 g/dL (ref 30.0–36.0)
MCV: 91.7 fL (ref 80.0–100.0)
Platelets: 202 10*3/uL (ref 150–400)
RBC: 4.1 MIL/uL (ref 3.87–5.11)
RDW: 15 % (ref 11.5–15.5)
WBC: 1.9 10*3/uL — ABNORMAL LOW (ref 4.0–10.5)
nRBC: 0 % (ref 0.0–0.2)

## 2021-11-24 LAB — COMPREHENSIVE METABOLIC PANEL
ALT: 46 U/L — ABNORMAL HIGH (ref 0–44)
AST: 43 U/L — ABNORMAL HIGH (ref 15–41)
Albumin: 2.9 g/dL — ABNORMAL LOW (ref 3.5–5.0)
Alkaline Phosphatase: 101 U/L (ref 38–126)
Anion gap: 8 (ref 5–15)
BUN: 7 mg/dL — ABNORMAL LOW (ref 8–23)
CO2: 26 mmol/L (ref 22–32)
Calcium: 8.4 mg/dL — ABNORMAL LOW (ref 8.9–10.3)
Chloride: 102 mmol/L (ref 98–111)
Creatinine, Ser: 0.7 mg/dL (ref 0.44–1.00)
GFR, Estimated: >60 mL/min (ref >60)
Glucose, Bld: 101 mg/dL — ABNORMAL HIGH (ref 70–99)
Potassium: 3.2 mmol/L — ABNORMAL LOW (ref 3.5–5.1)
Sodium: 136 mmol/L (ref 135–145)
Total Bilirubin: 0.7 mg/dL (ref 0.3–1.2)
Total Protein: 6.1 g/dL — ABNORMAL LOW (ref 6.5–8.1)

## 2021-11-24 MED ORDER — METOPROLOL SUCCINATE ER 50 MG PO TB24
50.0000 mg | ORAL_TABLET | Freq: Every day | ORAL | Status: DC
Start: 2021-11-24 — End: 2021-11-28
  Administered 2021-11-24 – 2021-11-28 (×5): 50 mg via ORAL
  Filled 2021-11-24 (×5): qty 1

## 2021-11-24 MED ORDER — SODIUM CHLORIDE 0.9% FLUSH
10.0000 mL | Freq: Two times a day (BID) | INTRAVENOUS | Status: DC
  Administered 2021-11-26 – 2021-11-27 (×3): 10 mL

## 2021-11-24 MED ORDER — SODIUM CHLORIDE 0.9 % IV SOLN: INTRAVENOUS | Status: AC

## 2021-11-24 MED ORDER — MELATONIN 5 MG PO TABS
5.0000 mg | ORAL_TABLET | Freq: Once | ORAL | Status: AC
  Administered 2021-11-24: 5 mg via ORAL
  Filled 2021-11-24: qty 1

## 2021-11-24 MED ORDER — POTASSIUM CHLORIDE CRYS ER 20 MEQ PO TBCR
40.0000 meq | EXTENDED_RELEASE_TABLET | Freq: Once | ORAL | Status: AC
Start: 2021-11-24 — End: 2021-11-24
  Administered 2021-11-24: 40 meq via ORAL
  Filled 2021-11-24: qty 2

## 2021-11-24 NOTE — Progress Notes (Signed)
       REMOTE CROSS COVER NOTE  NAME: Regina James MRN: [id number] DOB : September 22, 1933    Date of Service   11/24/21  HPI/Events of Note   Medication request received for sleep aid.  Interventions   Plan: Melatonin x1     This document was prepared using Dragon voice recognition software and may include unintentional dictation errors.  Neomia Glass DNP, MHA, FNP-BC Nurse Practitioner [facility] Pager [phone]

## 2021-11-24 NOTE — Progress Notes (Signed)
PROGRESS NOTE  JAY KEMPE ZRA:[id number] DOB: 1934/01/17 DOA: 11/23/2021 PCP: Kathyrn Lass, MD   LOS: 0 days   Brief Narrative / Interim history: 88 year old female with right ovarian epithelial cancer on active chemotherapy with carboplatin and paclitaxel, history of embolic CVA, HTN, HLD who comes into the hospital with weakness, abdominal pain, nausea.  She recently started chemotherapy a week ago, she is on cycle #1/3 every 21 days.  She felt well initially but over the last 3 days she has been having a poor appetite, weakness to the point that she is having difficulties walking, and intermittent abdominal pain.  She occasionally reports difficulties with urination/incontinence due to the mass effect on the bladder.  She also had some chills at home  Pertinent data: CT head 8/21-no acute intracranial abnormalities, chronic small vessel ischemic disease CT abdomen pelvis 8/21-mass in the right paramidline anatomic pelvis, compatible with ovarian carcinoma, 5 mm lateral left abdominal peritoneal nodule that needs follow-up.  Mild right hydronephrosis likely secondary to ovarian mass.  Subjective / 24h Interval events: Continues to feel weak this morning, no nausea or vomiting.  Tells me her appetite is good.  Assesement and Plan: Principal Problem:   UTI (urinary tract infection) Active Problems:   Volume depletion   Right ovarian epithelial cancer (HCC)   Leukopenia due to antineoplastic chemotherapy (HCC)   Paroxysmal SVT (supraventricular tachycardia) (HCC)   History of cerebrovascular accident (CVA) due to embolism  Principal problem Urinary tract infection-this is in the setting of immunosuppression due to chemotherapy, leukopenia.  Urinalysis on admission was concerning for UTI and she does have symptoms.  She was started on IV ceftriaxone, continue while monitoring cultures.  She is still spiking fevers, 100.9 last night and she is 100.2 this morning.  Active  problems Dehydration-received IV fluids overnight, encourage p.o. intake today  Hypokalemia-potassium 3.2, replete and continue to monitor  Right ovarian epithelial cancer -Follows with oncology, Dr. Alvy Bimler.  Recently started chemotherapy with carboplatin and paclitaxel on 8/15.  Imaging on admission showed mild right-sided hydronephrosis, she does not have significant symptoms with it and her kidney function is normal.  Continue to monitor clinically   History of CVA -Continue aspirin and pravastatin.   Paroxysmal SVT-Home Toprol-XL held on admission due to concern for fever, dehydration, but she is hypertensive, resume at a lower dose and closely monitor  Paroxysmal A-fib-patient had a single episode of A-fib in the setting of a surgery back in 2018.  She saw cardiology as an outpatient, it was decided for her not to be on anticoagulation since he was a one-time episode.  On auscultation she is irregular, but EKG reveals sinus rhythm with PACs.  Continue to monitor  Scheduled Meds:  aspirin  325 mg Oral Daily   Chlorhexidine Gluconate Cloth  6 each Topical Daily   enoxaparin (LOVENOX) injection  40 mg Subcutaneous Q24H   metoprolol succinate  50 mg Oral Daily   pravastatin  20 mg Oral q1800   sodium chloride flush  10-40 mL Intracatheter Q12H   sodium chloride flush  10-40 mL Intracatheter Q12H   Continuous Infusions:  cefTRIAXone (ROCEPHIN)  IV     PRN Meds:.acetaminophen **OR** acetaminophen, ondansetron **OR** ondansetron (ZOFRAN) IV, sodium chloride flush  Diet Orders (From admission, onward)     Start     Ordered   11/23/21 2019  Diet regular Room service appropriate? Yes; Fluid consistency: Thin  Diet effective now       Question Answer Comment  Room  service appropriate? Yes   Fluid consistency: Thin      11/23/21 2019            DVT prophylaxis: enoxaparin (LOVENOX) injection 40 mg Start: 11/23/21 2200   Lab Results  Component Value Date   PLT 202 11/24/2021       Code Status: Full Code  Family Communication: No family at bedside  Status is: Observation  The patient will require care spanning > 2 midnights and should be moved to inpatient because: Persistent fever, leukopenia, weakness  Level of care: Med-Surg  Consultants:  Oncology  Objective: Vitals:   11/23/21 2207 11/23/21 2314 11/24/21 0359 11/24/21 0500  BP:  (!) 151/114 (!) 181/81   Pulse:  (!) 56 97   Resp:  16 16   Temp: 98.6 F (37 C) (!) 100.9 F (38.3 C) 100.2 F (37.9 C)   TempSrc: Oral Oral Oral   SpO2:  98% 95%   Weight:  74.5 kg  74.3 kg  Height:  '5\' 6"'$  (1.676 m)      Intake/Output Summary (Last 24 hours) at 11/24/2021 1045 Last data filed at 11/24/2021 1008 Gross per 24 hour  Intake 2292.38 ml  Output 750 ml  Net 1542.38 ml   Wt Readings from Last 3 Encounters:  11/24/21 74.3 kg  11/17/21 75.5 kg  11/11/21 74.3 kg    Examination:  Constitutional: NAD Eyes: no scleral icterus ENMT: Mucous membranes are moist.  Neck: normal, supple Respiratory: clear to auscultation bilaterally, no wheezing, no crackles. Normal respiratory effort. No accessory muscle use.  Cardiovascular: Urine clear, no significant murmurs. No LE edema.  Abdomen: non distended, no tenderness. Bowel sounds positive.  Musculoskeletal: no clubbing / cyanosis.  Skin: no rashes Neurologic: non focal   Data Reviewed: I have independently reviewed following labs and imaging studies   CBC Recent Labs  Lab 11/23/21 1344 11/24/21 0537  WBC 1.9* 1.9*  HGB 12.4 12.6  HCT 36.7 37.6  PLT 208 202  MCV 92.7 91.7  MCH 31.3 30.7  MCHC 33.8 33.5  RDW 15.1 15.0  LYMPHSABS 0.9  --   MONOABS 0.1  --   EOSABS 0.1  --   BASOSABS 0.0  --     Recent Labs  Lab 11/23/21 1344 11/24/21 0537  NA 137 136  K 3.6 3.2*  CL 102 102  CO2 26 26  GLUCOSE 106* 101*  BUN 14 7*  CREATININE 0.73 0.70  CALCIUM 8.2* 8.4*  AST 50* 43*  ALT 45* 46*  ALKPHOS 94 101  BILITOT 0.9 0.7  ALBUMIN  3.0* 2.9*    ------------------------------------------------------------------------------------------------------------------ No results for input(s): "CHOL", "HDL", "LDLCALC", "TRIG", "CHOLHDL", "LDLDIRECT" in the last 72 hours.  Lab Results  Component Value Date   HGBA1C 5.7 (H) 11/01/2016   ------------------------------------------------------------------------------------------------------------------ No results for input(s): "TSH", "T4TOTAL", "T3FREE", "THYROIDAB" in the last 72 hours.  Invalid input(s): "FREET3"  Cardiac Enzymes No results for input(s): "CKMB", "TROPONINI", "MYOGLOBIN" in the last 168 hours.  Invalid input(s): "CK" ------------------------------------------------------------------------------------------------------------------ No results found for: "BNP"  CBG: No results for input(s): "GLUCAP" in the last 168 hours.  Recent Results (from the past 240 hour(s))  Blood culture (routine x 2)     Status: None (Preliminary result)   Collection Time: 11/23/21  5:20 PM   Specimen: BLOOD  Result Value Ref Range Status   Specimen Description   Final    BLOOD RIGHT ANTECUBITAL Performed at [facility] [address] [place], [place] [address]  Special Requests   Final    BOTTLES DRAWN AEROBIC AND ANAEROBIC Blood Culture adequate volume Performed at [facility] [address] [place], [place] [address]    Culture   Final    NO GROWTH < 24 HOURS Performed at [facility] [address] [place], [place] [address]    Report Status PENDING  Incomplete  Blood culture (routine x 2)     Status: None (Preliminary result)   Collection Time: 11/23/21  5:35 PM   Specimen: BLOOD  Result Value Ref Range Status   Specimen Description   Final    BLOOD BLOOD LEFT HAND Performed at [facility] [address] [place], [place] [address]    Special Requests   Final    BOTTLES DRAWN AEROBIC AND  ANAEROBIC Blood Culture results may not be optimal due to an inadequate volume of blood received in culture bottles Performed at [facility] [address] [place], [place] [address]    Culture   Final    NO GROWTH < 24 HOURS Performed at [facility] [address] [place], [place] [address]    Report Status PENDING  Incomplete     Radiology Studies: CT ABDOMEN PELVIS W CONTRAST  Result Date: 11/23/2021 CLINICAL DATA:  Abdominal pain.  * Tracking Code: BO * EXAM: CT ABDOMEN AND PELVIS WITH CONTRAST TECHNIQUE: Multidetector CT imaging of the abdomen and pelvis was performed using the standard protocol following bolus administration of intravenous contrast. RADIATION DOSE REDUCTION: This exam was performed according to the departmental dose-optimization program which includes automated exposure control, adjustment of the mA and/or kV according to patient size and/or use of iterative reconstruction technique. CONTRAST:  161m OMNIPAQUE IOHEXOL 300 MG/ML  SOLN COMPARISON:  10/22/2021 and MR pelvis 10/03/2021. FINDINGS: Lower chest: Trace right pleural effusion with adjacent compressive atelectasis in the right lower lobe. Subsegmental volume loss in the left lower lobe. Atherosclerotic calcification of the aorta and coronary arteries. Catheter tip in the high right atrium. Heart is enlarged. No pericardial effusion. Distal esophagus is grossly unremarkable. Hepatobiliary: Liver is decreased in attenuation diffusely. Biliary ductal dilatation, likely related to cholecystectomy. Pancreas: Negative. Spleen: Negative. Adrenals/Urinary Tract: Adrenal glands are unremarkable. Horseshoe kidney. 3.3 cm fluid density lesion in the right kidney, compatible with a cyst. No follow-up necessary. 1.6 cm low-attenuation lesion in the midline renal parenchyma, too small to characterize. No specific follow-up necessary. Mild right hydronephrosis, new from 10/22/2021, likely due to external  compression by a large cystic and solid mass in the anatomic pelvis. Left ureter is decompressed. Bladder is low in volume. Stomach/Bowel: Stomach, small bowel and colon are unremarkable. Appendix is not readily visualized. Vascular/Lymphatic: Atherosclerotic calcification of the aorta. Retroaortic left renal vein. No pathologically enlarged lymph nodes. Small bowel mesenteric lymph nodes are not enlarged by CT size criteria. Reproductive: Large cystic and solid mass in the right paramidline anatomic pelvis, measuring 9.6 x 15.6 cm (2/67), as on 10/22/2021. Mass compresses the uterus inferiorly. Other: Trace pelvic free fluid. 5 mm peritoneal nodule adjacent to the proximal descending colon (2/29), unchanged and indeterminate. Mesenteries and peritoneum are otherwise unremarkable. Musculoskeletal: Degenerative changes in the spine and hips. Minimal grade 1 anterolisthesis of L4 on L5. IMPRESSION: 1. Large cystic and solid mass in the right paramidline anatomic pelvis, compatible with ovarian carcinoma. 2. 5 mm lateral left abdominal peritoneal nodule, indeterminate. Recommend attention on follow-up. 3. Mild right hydronephrosis, likely secondary to compression by  the aforementioned ovarian mass. 4. Trace right pleural effusion. 5. Hepatic steatosis. 6. Aortic atherosclerosis (ICD10-I70.0). Coronary artery calcification. Electronically Signed   By: Lorin Picket M.D.   On: 11/23/2021 15:38   CT Head Wo Contrast  Result Date: 11/23/2021 CLINICAL DATA:  Neuro deficit, acute, stroke suspected. EXAM: CT HEAD WITHOUT CONTRAST TECHNIQUE: Contiguous axial images were obtained from the base of the skull through the vertex without intravenous contrast. RADIATION DOSE REDUCTION: This exam was performed according to the departmental dose-optimization program which includes automated exposure control, adjustment of the mA and/or kV according to patient size and/or use of iterative reconstruction technique. COMPARISON:   Head CT 06/12/2020 and MRI 11/04/2016 FINDINGS: Brain: There is no evidence of an acute infarct, intracranial hemorrhage, mass, midline shift, or extra-axial fluid collection. Small chronic cortical infarcts are again seen in the left frontal and left occipital lobes. Patchy hypodensities in the cerebral white matter bilaterally are unchanged and nonspecific but compatible with moderate chronic small vessel ischemic disease. There is mild cerebral atrophy. Vascular: Calcified atherosclerosis at the skull base. No hyperdense vessel. Skull: No fracture or suspicious osseous lesion. Sinuses/Orbits: Paranasal sinuses and mastoid air cells are clear. Bilateral cataract extraction. Other: None. IMPRESSION: 1. No evidence of acute intracranial abnormality. 2. Moderate chronic small vessel ischemic disease. Electronically Signed   By: Logan Bores M.D.   On: 11/23/2021 15:29     Marzetta Board, MD, PhD [facility]  Between 7 am - 7 pm I am available, please contact me via Amion (for emergencies) or Securechat (non urgent messages)  Between 7 pm - 7 am I am not available, please contact night coverage MD/APP via Amion

## 2021-11-24 NOTE — Evaluation (Signed)
Physical Therapy Evaluation Patient Details Name: Regina James MRN: [id number] DOB: 04-27-1933 Today's Date: 11/24/2021  History of Present Illness  88 year old female with right ovarian epithelial cancer on active chemotherapy with carboplatin and paclitaxel, history of embolic CVA, HTN, HLD who comes into the hospital with weakness, abdominal pain, nausea.  She recently started chemotherapy a week ago, she is on cycle #1/3 every 21 days.  She felt well initially but over the last 3 days she has been having a poor appetite, weakness to the point that she is having difficulties walking, and intermittent abdominal pain  Clinical Impression  The patient   admitted with above medical problems. Patient lives alone and independent , driving.  Patient  with urinary urgency upon standing. Patient was able to ambulate in room with min guard/assistance, reporting feeling weak.   Patient reports support from family and plans return home. Patient does reside in a 2 level home with bed and bath on second level.  Pt admitted with above diagnosis.  Pt currently with functional limitations due to the deficits listed below (see PT Problem List). Pt will benefit from skilled PT to increase their independence and safety with mobility to allow discharge to the venue listed below.        Recommendations for follow up therapy are one component of a multi-disciplinary discharge planning process, led by the attending physician.  Recommendations may be updated based on patient status, additional functional criteria and insurance authorization.  Follow Up Recommendations Home health PT      Assistance Recommended at Discharge Intermittent Supervision/Assistance  Patient can return home with the following  A little help with walking and/or transfers;Help with stairs or ramp for entrance;Assist for transportation;Assistance with cooking/housework    Equipment Recommendations  (TBD)  Recommendations for Other  Services       Functional Status Assessment Patient has had a recent decline in their functional status and demonstrates the ability to make significant improvements in function in a reasonable and predictable amount of time.     Precautions / Restrictions Precautions Precautions: Fall Precaution Comments: urinary urgency, needs briefs      Mobility  Bed Mobility Overal bed mobility: Modified Independent                  Transfers Overall transfer level: Needs assistance   Transfers: Sit to/from Stand, Bed to chair/wheelchair/BSC Sit to Stand: Min guard   Step pivot transfers: Min guard       General transfer comment: upon standing, patient  needed BSC with min guard.    Ambulation/Gait Ambulation/Gait assistance: Min assist Gait Distance (Feet): 20 Feet Assistive device: None Gait Pattern/deviations: Step-through pattern, Staggering right, Staggering left Gait velocity: decr     General Gait Details: reaching for objects for support.  Stairs            Wheelchair Mobility    Modified Rankin (Stroke Patients Only)       Balance Overall balance assessment: Mild deficits observed, not formally tested                                           Pertinent Vitals/Pain Pain Assessment Pain Assessment: Faces Faces Pain Scale: Hurts even more Pain Location: abdomen where mass is pressing Pain Descriptors / Indicators: Discomfort Pain Intervention(s): Monitored during session    Home Living Family/patient expects to be discharged to:: Private  residence Living Arrangements: Alone Available Help at Discharge: Family;Available 24 hours/day Type of Home: House Home Access: Stairs to enter Entrance Stairs-Rails: Psychiatric nurse of Steps: to sidewalk, 2 to front door Alternate Level Stairs-Number of Steps: 8, a landing and 4 more Home Layout: Two level;Bed/bath upstairs Home Equipment: Cane - single point;Shower  seat - built in      Prior Function Prior Level of Function : Independent/Modified Independent                     Hand Dominance   Dominant Hand: Right    Extremity/Trunk Assessment   Upper Extremity Assessment Upper Extremity Assessment: Overall WFL for tasks assessed    Lower Extremity Assessment Lower Extremity Assessment: Generalized weakness    Cervical / Trunk Assessment Cervical / Trunk Assessment: Normal  Communication   Communication: No difficulties  Cognition Arousal/Alertness: Awake/alert Behavior During Therapy: WFL for tasks assessed/performed Overall Cognitive Status: Within Functional Limits for tasks assessed                                          General Comments      Exercises     Assessment/Plan    PT Assessment Patient needs continued PT services  PT Problem List Decreased strength;Decreased mobility;Decreased activity tolerance       PT Treatment Interventions Gait training;DME instruction;Therapeutic activities;Functional mobility training;Therapeutic exercise    PT Goals (Current goals can be found in the Care Plan section)  Acute Rehab PT Goals Patient Stated Goal: feel better and go home. PT Goal Formulation: With patient Time For Goal Achievement: 12/08/21    Frequency Min 3X/week     Co-evaluation               AM-PAC PT "6 Clicks" Mobility  Outcome Measure Help needed turning from your back to your side while in a flat bed without using bedrails?: None Help needed moving from lying on your back to sitting on the side of a flat bed without using bedrails?: None Help needed moving to and from a bed to a chair (including a wheelchair)?: A Little Help needed standing up from a chair using your arms (e.g., wheelchair or bedside chair)?: A Little Help needed to walk in hospital room?: A Little Help needed climbing 3-5 steps with a railing? : A Lot 6 Click Score: 19    End of Session    Activity Tolerance: Patient limited by fatigue Patient left: in bed;with call bell/phone within reach;with bed alarm set Nurse Communication: Mobility status PT Visit Diagnosis: Muscle weakness (generalized) (M62.81)    Time: 3220-2542 PT Time Calculation (min) (ACUTE ONLY): 23 min   Charges:   PT Evaluation $PT Eval Low Complexity: 1 Low PT Treatments $Gait Training: 8-22 mins        [place] Office [phone] Weekend pager-[phone]   Claretha Cooper 11/24/2021, 1:35 PM

## 2021-11-24 NOTE — Progress Notes (Addendum)
HEMATOLOGY-ONCOLOGY PROGRESS NOTE  I saw the patient, reviewed plan of care with the patient and her daughter ASSESSMENT AND PLAN: Right ovarian epithelial cancer ([place]) Currently receiving neoadjuvant chemotherapy with carboplatin and Taxol She had multiple complications including infection, slight worsening slurring of speech and dehydration Continue supportive care I plan future dose reduction for cycle 2  Urinary tract infection UA suggestive of UTI, urine culture pending Continue IV antibiotics and follow-up on urine culture She is also mildly dehydrated due to poor oral intake I recommend IV fluid support for 2 days  Leukopenia secondary to recent chemotherapy Given fever and signs of UTI but clinically, she does not have any evidence of septic shock Hold off G-CSF for now Continue to trend her CBC over the next 2 days   Cerebrovascular accident (CVA) due to embolism of left middle cerebral artery ([place]) She has history of stroke and mild residual permanent right-sided weakness She is able to cope well with her mild disability She has excellent family support I recommend resumption of aspirin therapy   Tobacco abuse Unfortunately, she continues to smoke We discussed importance of nicotine cessation  Discharge planning Anticipate that she will be in the hospital for 2 to 3 days for monitoring of fever and to follow-up on cultures.  Plan of care is fully discussed with her daughter, [name] I will check on her again on Thursday I do not recommend discharge tomorrow but rather we need to fully follow on cultures and she needs to continue IV antibiotics for at least 2 days prior to discharge  Michaelle Copas, MD SUBJECTIVE: The patient is followed by our office for ovarian cancer Recently started chemotherapy with carboplatin and Taxol on 11/17/2021 Has been experiencing some loose stools since chemotherapy Imodium which is helping Daughter called our office yesterday  to report that her mother was having difficulty with speech and she was advised to go to the emergency department for further evaluation CT head showed no acute intracranial abnormality UA suggestive of UTI Temp up to 100.9 last evening On ceftriaxone This morning, reports that she still does not feel well overall Reports some mouth sores States that she feels "achy" and "uncoordinated" She currently denies nausea, vomiting, diarrhea She is not having any chest pain or shortness of breath Her oral intake is very poor Her daughter confirmed that the patient continues to smoke  Oncology History  Right ovarian epithelial cancer ([place])  09/30/2021 Initial Diagnosis   Patient was seen by her primary care provider at the end of June.  She endorsed decreased appetite as well as abdominal pain for approximately 2 weeks.   10/01/2021 Imaging   CT abdomen and pelvis 1. Large complex pelvic mass suspicious for malignancy arising from the right ovary. Further characterization with MRI and gynecology/surgical consult is advised. 2. No bowel obstruction. 3. Fatty liver. 4. Horseshoe kidney. 5. Aortic Atherosclerosis (ICD10-I70.0).   10/22/2021 Imaging   CT abdomen and pelvis 1. Slightly increased size of the mixed cystic and solid mass presumably arising from the right ovary concerning for primary ovarian malignancy. 2. New small volume abdominopelvic ascites. 3. Horseshoe kidney 4.  Aortic Atherosclerosis (ICD10-I70.0).   10/27/2021 Tumor Marker   Patient's tumor was tested for the following markers: CA-125. Results of the tumor marker test revealed 180.   10/30/2021 Initial Diagnosis   Patient was seen by her primary care provider at the end of June.  She endorsed decreased appetite as well as abdominal pain for approximately 2 weeks  11/03/2021 Pathology Results   FINAL MICROSCOPIC DIAGNOSIS:   A.   CUL DE SAC, ANTERIOR BIOPSY:  -    Adenocarcinoma, present on frozen section slides.   B.    CUL DE SAC, ANTERIOR, #2, BIOPSY:  -    Adenocarcinoma, strongly CDX-2 immunoreactive, most suggestive of gastrointestinal primary.   COMMENT:   The immunophenotype favors a gastrointestinal primary.   The tumor (Block B1) was interrogated with immunohistochemical (IHC) stains (CK7, CK20, CDX-2, PAX8, ER, PR, p53, p16, vimentin).   The tumor is strongly CDX-2 positive and has focal p16 reactivity.  The tumor is negative for CK7, CK20, PAX8, ER, PR and vimentin.  The p53 is wildtype.  The IHC controls are satisfactory.    11/03/2021 Imaging   MRI pelvis 1. Large mixed solid and cystic mass of the right ovary measuring at least 13.6 x 7.4 x 9.9 cm, highly concerning for primary ovarian malignancy. 2. Mass closely abuts and possibly directly involves the posterior uterine fundus. 3. Normal left ovary. 4. No evidence of pelvic lymphadenopathy. 5. Partially imaged horseshoe kidney.     11/09/2021 Initial Diagnosis   Right ovarian epithelial cancer ([place])   11/09/2021 Cancer Staging   Staging form: Ovary, Fallopian Tube, and Primary Peritoneal Carcinoma, AJCC 8th Edition - Clinical stage from 11/09/2021: FIGO Stage III (cT3, cN0, cM0) - Signed by [name], MD on 11/09/2021 Stage prefix: Initial diagnosis   11/17/2021 - 11/17/2021 Chemotherapy   Patient is on Treatment Plan : OVARIAN Carboplatin (AUC 6) / Paclitaxel (175) q21d x 6 cycles     11/17/2021 -  Chemotherapy   Patient is on Treatment Plan : OVARIAN Carboplatin (AUC 6) + Paclitaxel (175) q21d X 6 Cycles     11/20/2021 Tumor Marker   Patient's tumor was tested for the following markers: CA-125. Results of the tumor marker test revealed 89.3.      REVIEW OF SYSTEMS:   Review of Systems  Constitutional:  Positive for fever and malaise/fatigue. Negative for chills.  HENT:         Mouth sores  Respiratory: Negative.    Cardiovascular: Negative.   Gastrointestinal: Negative.   Genitourinary: Negative.   Skin: Negative.    Neurological: Negative.   Endo/Heme/Allergies: Negative.     I have reviewed the past medical history, past surgical history, social history and family history with the patient and they are unchanged from previous note.   PHYSICAL EXAMINATION: ECOG PERFORMANCE STATUS: 2 - Symptomatic, <50% confined to bed  Vitals:   11/23/21 2314 11/24/21 0359  BP: (!) 151/114 (!) 181/81  Pulse: (!) 56 97  Resp: 16 16  Temp: (!) 100.9 F (38.3 C) 100.2 F (37.9 C)  SpO2: 98% 95%   Filed Weights   11/23/21 2314 11/24/21 0500  Weight: 74.5 kg 74.3 kg    Intake/Output from previous day: 08/21 0701 - 08/22 0700 In: 2174.4 [P.O.:120; I.V.:1054.2; IV Piggyback:1000.2] Out: -   Physical Exam Vitals reviewed.  Constitutional:      General: She is not in acute distress. HENT:     Head: Normocephalic.     Mouth/Throat:     Comments: Mucous membranes dry, small ulcerations noted on buccal mucosa Cardiovascular:     Rate and Rhythm: Normal rate.  Pulmonary:     Effort: Pulmonary effort is normal. No respiratory distress.  Abdominal:     Palpations: Abdomen is soft.     Tenderness: There is no abdominal tenderness.  Skin:    General: Skin is  warm and dry.  Neurological:     Mental Status: She is alert and oriented to person, place, and time.   Noted mild slurring of speech LABORATORY DATA:  I have reviewed the data as listed    Latest Ref Rng & Units 11/24/2021    5:37 AM 11/23/2021    1:44 PM 11/16/2021   11:37 AM  CMP  Glucose 70 - 99 mg/dL 101  106  97   BUN 8 - 23 mg/dL 7  14  8    Creatinine 0.44 - 1.00 mg/dL 0.70  0.73  0.64   Sodium 135 - 145 mmol/L 136  137  139   Potassium 3.5 - 5.1 mmol/L 3.2  3.6  3.8   Chloride 98 - 111 mmol/L 102  102  105   CO2 22 - 32 mmol/L 26  26  29    Calcium 8.9 - 10.3 mg/dL 8.4  8.2  8.7   Total Protein 6.5 - 8.1 g/dL 6.1  6.1  6.7   Total Bilirubin 0.3 - 1.2 mg/dL 0.7  0.9  0.6   Alkaline Phos 38 - 126 U/L 101  94  101   AST 15 - 41 U/L 43   50  16   ALT 0 - 44 U/L 46  45  10     Lab Results  Component Value Date   WBC 1.9 (L) 11/24/2021   HGB 12.6 11/24/2021   HCT 37.6 11/24/2021   MCV 91.7 11/24/2021   PLT 202 11/24/2021   NEUTROABS 0.7 (L) 11/23/2021    Lab Results  Component Value Date   CEA1 4.6 10/27/2021    CT ABDOMEN PELVIS W CONTRAST  Result Date: 11/23/2021 CLINICAL DATA:  Abdominal pain.  * Tracking Code: BO * EXAM: CT ABDOMEN AND PELVIS WITH CONTRAST TECHNIQUE: Multidetector CT imaging of the abdomen and pelvis was performed using the standard protocol following bolus administration of intravenous contrast. RADIATION DOSE REDUCTION: This exam was performed according to the departmental dose-optimization program which includes automated exposure control, adjustment of the mA and/or kV according to patient size and/or use of iterative reconstruction technique. CONTRAST:  183m OMNIPAQUE IOHEXOL 300 MG/ML  SOLN COMPARISON:  10/22/2021 and MR pelvis 10/03/2021. FINDINGS: Lower chest: Trace right pleural effusion with adjacent compressive atelectasis in the right lower lobe. Subsegmental volume loss in the left lower lobe. Atherosclerotic calcification of the aorta and coronary arteries. Catheter tip in the high right atrium. Heart is enlarged. No pericardial effusion. Distal esophagus is grossly unremarkable. Hepatobiliary: Liver is decreased in attenuation diffusely. Biliary ductal dilatation, likely related to cholecystectomy. Pancreas: Negative. Spleen: Negative. Adrenals/Urinary Tract: Adrenal glands are unremarkable. Horseshoe kidney. 3.3 cm fluid density lesion in the right kidney, compatible with a cyst. No follow-up necessary. 1.6 cm low-attenuation lesion in the midline renal parenchyma, too small to characterize. No specific follow-up necessary. Mild right hydronephrosis, new from 10/22/2021, likely due to external compression by a large cystic and solid mass in the anatomic pelvis. Left ureter is decompressed.  Bladder is low in volume. Stomach/Bowel: Stomach, small bowel and colon are unremarkable. Appendix is not readily visualized. Vascular/Lymphatic: Atherosclerotic calcification of the aorta. Retroaortic left renal vein. No pathologically enlarged lymph nodes. Small bowel mesenteric lymph nodes are not enlarged by CT size criteria. Reproductive: Large cystic and solid mass in the right paramidline anatomic pelvis, measuring 9.6 x 15.6 cm (2/67), as on 10/22/2021. Mass compresses the uterus inferiorly. Other: Trace pelvic free fluid. 5 mm peritoneal nodule adjacent to the  proximal descending colon (2/29), unchanged and indeterminate. Mesenteries and peritoneum are otherwise unremarkable. Musculoskeletal: Degenerative changes in the spine and hips. Minimal grade 1 anterolisthesis of L4 on L5. IMPRESSION: 1. Large cystic and solid mass in the right paramidline anatomic pelvis, compatible with ovarian carcinoma. 2. 5 mm lateral left abdominal peritoneal nodule, indeterminate. Recommend attention on follow-up. 3. Mild right hydronephrosis, likely secondary to compression by the aforementioned ovarian mass. 4. Trace right pleural effusion. 5. Hepatic steatosis. 6. Aortic atherosclerosis (ICD10-I70.0). Coronary artery calcification. Electronically Signed   By: Lorin Picket M.D.   On: 11/23/2021 15:38   CT Head Wo Contrast  Result Date: 11/23/2021 CLINICAL DATA:  Neuro deficit, acute, stroke suspected. EXAM: CT HEAD WITHOUT CONTRAST TECHNIQUE: Contiguous axial images were obtained from the base of the skull through the vertex without intravenous contrast. RADIATION DOSE REDUCTION: This exam was performed according to the departmental dose-optimization program which includes automated exposure control, adjustment of the mA and/or kV according to patient size and/or use of iterative reconstruction technique. COMPARISON:  Head CT 06/12/2020 and MRI 11/04/2016 FINDINGS: Brain: There is no evidence of an acute infarct,  intracranial hemorrhage, mass, midline shift, or extra-axial fluid collection. Small chronic cortical infarcts are again seen in the left frontal and left occipital lobes. Patchy hypodensities in the cerebral white matter bilaterally are unchanged and nonspecific but compatible with moderate chronic small vessel ischemic disease. There is mild cerebral atrophy. Vascular: Calcified atherosclerosis at the skull base. No hyperdense vessel. Skull: No fracture or suspicious osseous lesion. Sinuses/Orbits: Paranasal sinuses and mastoid air cells are clear. Bilateral cataract extraction. Other: None. IMPRESSION: 1. No evidence of acute intracranial abnormality. 2. Moderate chronic small vessel ischemic disease. Electronically Signed   By: Logan Bores M.D.   On: 11/23/2021 15:29   IR IMAGING GUIDED PORT INSERTION  Result Date: 11/12/2021 INDICATION: 88 year old female with history of right-sided ovarian cancer. She presents for port catheter placement for chemotherapy. EXAM: IMPLANTED PORT A CATH PLACEMENT WITH ULTRASOUND AND FLUOROSCOPIC GUIDANCE MEDICATIONS: None. ANESTHESIA/SEDATION: Versed 1.5 mg IV; Fentanyl 50 mcg IV; Moderate Sedation Time:  17 minutes The patient's vital signs and level of consciousness were continuously monitored during the procedure by the interventional radiology nurse under my direct supervision. FLUOROSCOPY: Radiation exposure index: 1 mGy reference air kerma COMPLICATIONS: None immediate. PROCEDURE: The right neck and chest was prepped with chlorhexidine, and draped in the usual sterile fashion using maximum barrier technique (cap and mask, sterile gown, sterile gloves, large sterile sheet, hand hygiene and cutaneous antiseptic). Local anesthesia was attained by infiltration with 1% lidocaine with epinephrine. Ultrasound demonstrated patency of the right internal jugular vein, and this was documented with an image. Under real-time ultrasound guidance, this vein was accessed with a 21  gauge micropuncture needle and image documentation was performed. A small dermatotomy was made at the access site with an 11 scalpel. A 0.018" wire was advanced into the SVC and the access needle exchanged for a 40F micropuncture vascular sheath. The 0.018" wire was then removed and a 0.035" wire advanced into the IVC. An appropriate location for the subcutaneous reservoir was selected below the clavicle and an incision was made through the skin and underlying soft tissues. The subcutaneous tissues were then dissected using a combination of blunt and sharp surgical technique and a pocket was formed. A single lumen power injectable portacatheter was then tunneled through the subcutaneous tissues from the pocket to the dermatotomy and the port reservoir placed within the subcutaneous pocket. The venous access  site was then serially dilated and a peel away vascular sheath placed over the wire. The wire was removed and the port catheter advanced into position under fluoroscopic guidance. The catheter tip is positioned in the superior cavoatrial junction. This was documented with a spot image. The portacatheter was then tested and found to flush and aspirate well. The port was flushed with saline followed by 100 units/mL heparinized saline. The pocket was then closed in two layers using first subdermal inverted interrupted absorbable sutures followed by a running subcuticular suture. The epidermis was then sealed with Dermabond. The dermatotomy at the venous access site was also closed with Dermabond. IMPRESSION: Successful placement of a right IJ approach Power Port with ultrasound and fluoroscopic guidance. The catheter is ready for use. Electronically Signed   By: Jacqulynn Cadet M.D.   On: 11/12/2021 16:24     Future Appointments  Date Time Provider [place]  11/26/2021  2:15 PM Lafonda Mosses, MD [facility] None  12/08/2021  9:15 AM [facility] [place] FLUSH [facility] None  12/08/2021  9:40 AM [name],  MD [facility] None  12/08/2021 10:30 AM [facility] INFUSION [facility] None  01/11/2022 10:00 AM [facility] VASC 4 [facility]  01/11/2022 11:35 AM [facility] ECHO 1 [id number]  02/02/2022  9:00 AM Hilty, Nadean Corwin, MD [facility]      LOS: 0 days

## 2021-11-25 DIAGNOSIS — N3001 Acute cystitis with hematuria: Secondary | ICD-10-CM | POA: Diagnosis not present

## 2021-11-25 DIAGNOSIS — T451X5A Adverse effect of antineoplastic and immunosuppressive drugs, initial encounter: Secondary | ICD-10-CM

## 2021-11-25 DIAGNOSIS — D701 Agranulocytosis secondary to cancer chemotherapy: Secondary | ICD-10-CM

## 2021-11-25 LAB — CBC WITH DIFFERENTIAL/PLATELET
Abs Immature Granulocytes: 0 10*3/uL (ref 0.00–0.07)
Basophils Absolute: 0.1 10*3/uL (ref 0.0–0.1)
Basophils Relative: 2 %
Eosinophils Absolute: 0.1 10*3/uL (ref 0.0–0.5)
Eosinophils Relative: 5 %
HCT: 37.5 % (ref 36.0–46.0)
Hemoglobin: 12.6 g/dL (ref 12.0–15.0)
Immature Granulocytes: 0 %
Lymphocytes Relative: 54 %
Lymphs Abs: 1.1 10*3/uL (ref 0.7–4.0)
MCH: 31.1 pg (ref 26.0–34.0)
MCHC: 33.6 g/dL (ref 30.0–36.0)
MCV: 92.6 fL (ref 80.0–100.0)
Monocytes Absolute: 0.6 10*3/uL (ref 0.1–1.0)
Monocytes Relative: 30 %
Neutro Abs: 0.2 10*3/uL — CL (ref 1.7–7.7)
Neutrophils Relative %: 9 %
Platelets: 217 10*3/uL (ref 150–400)
RBC: 4.05 MIL/uL (ref 3.87–5.11)
RDW: 15.1 % (ref 11.5–15.5)
WBC: 2 10*3/uL — ABNORMAL LOW (ref 4.0–10.5)
nRBC: 0 % (ref 0.0–0.2)

## 2021-11-25 LAB — COMPREHENSIVE METABOLIC PANEL
ALT: 44 U/L (ref 0–44)
AST: 41 U/L (ref 15–41)
Albumin: 2.8 g/dL — ABNORMAL LOW (ref 3.5–5.0)
Alkaline Phosphatase: 117 U/L (ref 38–126)
Anion gap: 8 (ref 5–15)
BUN: 9 mg/dL (ref 8–23)
CO2: 25 mmol/L (ref 22–32)
Calcium: 8.2 mg/dL — ABNORMAL LOW (ref 8.9–10.3)
Chloride: 102 mmol/L (ref 98–111)
Creatinine, Ser: 0.62 mg/dL (ref 0.44–1.00)
GFR, Estimated: >60 mL/min (ref >60)
Glucose, Bld: 106 mg/dL — ABNORMAL HIGH (ref 70–99)
Potassium: 3.7 mmol/L (ref 3.5–5.1)
Sodium: 135 mmol/L (ref 135–145)
Total Bilirubin: 0.6 mg/dL (ref 0.3–1.2)
Total Protein: 6.1 g/dL — ABNORMAL LOW (ref 6.5–8.1)

## 2021-11-25 LAB — MAGNESIUM: Magnesium: 1.5 mg/dL — ABNORMAL LOW (ref 1.7–2.4)

## 2021-11-25 MED ORDER — KATE FARMS STANDARD 1.4 PO LIQD
325.0000 mL | Freq: Two times a day (BID) | ORAL | Status: DC
  Administered 2021-11-25 – 2021-11-27 (×3): 325 mL via ORAL
  Filled 2021-11-25 (×7): qty 325

## 2021-11-25 MED ORDER — TIMOLOL MALEATE 0.5 % OP SOLN
1.0000 [drp] | Freq: Two times a day (BID) | OPHTHALMIC | Status: DC
  Administered 2021-11-25 – 2021-11-28 (×6): 1 [drp] via OPHTHALMIC
  Filled 2021-11-25: qty 5

## 2021-11-25 MED ORDER — LATANOPROST 0.005 % OP SOLN
1.0000 [drp] | Freq: Every day | OPHTHALMIC | Status: DC
  Administered 2021-11-25 – 2021-11-27 (×3): 1 [drp] via OPHTHALMIC
  Filled 2021-11-25: qty 2.5

## 2021-11-25 MED ORDER — MAGIC MOUTHWASH
5.0000 mL | Freq: Four times a day (QID) | ORAL | Status: DC
  Administered 2021-11-25 – 2021-11-27 (×11): 5 mL via ORAL
  Filled 2021-11-25 (×15): qty 5

## 2021-11-25 MED ORDER — MAGNESIUM SULFATE 2 GM/50ML IV SOLN
2.0000 g | Freq: Once | INTRAVENOUS | Status: AC
  Administered 2021-11-25: 2 g via INTRAVENOUS
  Filled 2021-11-25: qty 50

## 2021-11-25 NOTE — Progress Notes (Signed)
Initial Nutrition Assessment  INTERVENTION:   [employer] 1.4 PO BID, each provides 455 kcals and 20g protein   -Placed "High Calorie, High Protein" handout in AVS  -Messaged MD about ordering Magic Mouthwash for patient  NUTRITION DIAGNOSIS:   Increased nutrient needs related to cancer and cancer related treatments as evidenced by estimated needs.  GOAL:   Patient will meet greater than or equal to 90% of their needs  MONITOR:   PO intake, Supplement acceptance, Labs, Weight trends, I & O's, Skin  REASON FOR ASSESSMENT:   Malnutrition Screening Tool    ASSESSMENT:   88 year old female with right ovarian epithelial cancer on active chemotherapy with carboplatin and paclitaxel, history of embolic CVA, HTN, HLD who comes into the hospital with weakness, abdominal pain, nausea.  She recently started chemotherapy a week ago, she is on cycle #1/3 every 21 days.  She felt well initially but over the last 3 days she has been having a poor appetite, weakness to the point that she is having difficulties walking, and intermittent abdominal pain.  She occasionally reports difficulties with urination/incontinence due to the mass effect on the bladder.  Patient in room, daughter at bedside. Pt states she doesn't feel well. States she has not been eating well d/t chemo, developing mouth sores, increased taste changes ("foods don't taste right") and per daughter she is unable to eat much d/t the tumor size. RD offered to message MD about ordering Magic Mouthwash d/t pain from mouth sores. Pt states she had not been mouth rinsing.  Started chemo on 8/15.  Pt states she had tried Boost in the past. Since starting chemo Boost has caused diarrhea. Will trial [employer] plant based protein to see if pt tolerates better.   Per weight records, no weight change noted. Pt was not interested in being weighed, "do not see the point". Has been weighed every day this admission, will monitor weight  trends.  Medications: IV Mg sulfate  Labs reviewed: Low Mg   NUTRITION - FOCUSED PHYSICAL EXAM:  Pt declined.  Diet Order:   Diet Order             Diet regular Room service appropriate? Yes; Fluid consistency: Thin  Diet effective now                   EDUCATION NEEDS:   Education needs have been addressed  Skin:  Skin Assessment: Skin Integrity Issues: Skin Integrity Issues:: Incisions Incisions: 8/1 abdomen  Last BM:  8/22 -type 6  Height:   Ht Readings from Last 1 Encounters:  11/23/21 '5\' 6"'$  (1.676 m)    Weight:   Wt Readings from Last 1 Encounters:  11/25/21 75.6 kg    BMI:  Body mass index is 26.9 kg/m.  Estimated Nutritional Needs:   Kcal:  2100-2300  Protein:  100-110g  Fluid:  2.1L/day  Clayton Bibles, MS, RD, LDN Inpatient Clinical Dietitian Contact information available via Amion

## 2021-11-25 NOTE — Progress Notes (Signed)
PROGRESS NOTE  Regina James HYW:[id number] DOB: 08/12/1933 DOA: 11/23/2021 PCP: Kathyrn Lass, MD   LOS: 1 day   Brief Narrative / Interim history: 88 year old female with right ovarian epithelial cancer on active chemotherapy with carboplatin and paclitaxel, history of embolic CVA, HTN, HLD who comes into the hospital with weakness, abdominal pain, nausea.  She recently started chemotherapy a week ago, she is on cycle #1/3 every 21 days.  She felt well initially but over the last 3 days she has been having a poor appetite, weakness to the point that she is having difficulties walking, and intermittent abdominal pain.  She occasionally reports difficulties with urination/incontinence due to the mass effect on the bladder.  She also had some chills at home  Pertinent data: CT head 8/21-no acute intracranial abnormalities, chronic small vessel ischemic disease CT abdomen pelvis 8/21-mass in the right paramidline anatomic pelvis, compatible with ovarian carcinoma, 5 mm lateral left abdominal peritoneal nodule that needs follow-up.  Mild right hydronephrosis likely secondary to ovarian mass.  Subjective / 24h Interval events: Not sure she wants to continue with chemo  Assesement and Plan: Principal Problem:   UTI (urinary tract infection) Active Problems:   Volume depletion   Malignant neoplasm of ovary (HCC)   Leukopenia due to antineoplastic chemotherapy (HCC)   Paroxysmal SVT (supraventricular tachycardia) (HCC)   History of cerebrovascular accident (CVA) due to embolism   Urinary tract infection-this is in the setting of immunosuppression due to chemotherapy, leukopenia.  Urinalysis on admission was concerning for UTI and she does have symptoms.  She was started on IV ceftriaxone, continue while monitoring cultures.   -culture shows: proteus  Dehydration --received IV fluids, encourage p.o. intake today  Hypokalemia -potassium 3.2, replete and continue to monitor  Right ovarian  epithelial cancer -Follows with oncology, Dr. Alvy Bimler.  Recently started chemotherapy with carboplatin and paclitaxel on 8/15.  Imaging on admission showed mild right-sided hydronephrosis, she does not have significant symptoms with it and her kidney function is normal.  Continue to monitor clinically   History of CVA -Continue aspirin and pravastatin.   Paroxysmal SVT-Home Toprol-XL held on admission due to concern for fever, dehydration - resume at a lower dose and closely monitor  Paroxysmal A-fib-patient had a single episode of A-fib in the setting of a surgery back in 2018.  She saw cardiology as an outpatient, it was decided for her not to be on anticoagulation since he was a one-time episode.  On auscultation she is irregular, but EKG reveals sinus rhythm with PACs.  Continue to monitor  Scheduled Meds:  aspirin  325 mg Oral Daily   Chlorhexidine Gluconate Cloth  6 each Topical Daily   enoxaparin (LOVENOX) injection  40 mg Subcutaneous Q24H   feeding supplement (KATE FARMS STANDARD 1.4)  325 mL Oral BID BM   magic mouthwash  5 mL Oral QID   metoprolol succinate  50 mg Oral Daily   pravastatin  20 mg Oral q1800   sodium chloride flush  10-40 mL Intracatheter Q12H   sodium chloride flush  10-40 mL Intracatheter Q12H   Continuous Infusions:  sodium chloride 50 mL/hr at 11/24/21 1525   cefTRIAXone (ROCEPHIN)  IV 1 g (11/24/21 1425)   PRN Meds:.acetaminophen **OR** acetaminophen, ondansetron **OR** ondansetron (ZOFRAN) IV, sodium chloride flush  Diet Orders (From admission, onward)     Start     Ordered   11/23/21 2019  Diet regular Room service appropriate? Yes; Fluid consistency: Thin  Diet effective now  Question Answer Comment  Room service appropriate? Yes   Fluid consistency: Thin      11/23/21 2019            DVT prophylaxis: enoxaparin (LOVENOX) injection 40 mg Start: 11/23/21 2200   Lab Results  Component Value Date   PLT 217 11/25/2021      Code  Status: Full Code  Family Communication: No family at bedside  Status is: inpt  Home 24-48 hours  Level of care: Med-Surg  Consultants:  Oncology  Objective: Vitals:   11/24/21 2126 11/24/21 2130 11/25/21 0311 11/25/21 0500  BP:  137/70 (!) 156/99   Pulse: 83 82 88   Resp:   16   Temp:   99.8 F (37.7 C)   TempSrc:   Oral   SpO2: 99%  98%   Weight:    75.6 kg  Height:        Intake/Output Summary (Last 24 hours) at 11/25/2021 1209 Last data filed at 11/25/2021 0809 Gross per 24 hour  Intake --  Output 800 ml  Net -800 ml   Wt Readings from Last 3 Encounters:  11/25/21 75.6 kg  11/17/21 75.5 kg  11/11/21 74.3 kg    Examination:   General: Appearance:     Overweight female in no acute distress     Lungs:     respirations unlabored  Heart:    Normal heart rate.    MS:   All extremities are intact.   Neurologic:   Awake, alert   Data Reviewed: I have independently reviewed following labs and imaging studies   CBC Recent Labs  Lab 11/23/21 1344 11/24/21 0537 11/25/21 0310  WBC 1.9* 1.9* 2.0*  HGB 12.4 12.6 12.6  HCT 36.7 37.6 37.5  PLT 208 202 217  MCV 92.7 91.7 92.6  MCH 31.3 30.7 31.1  MCHC 33.8 33.5 33.6  RDW 15.1 15.0 15.1  LYMPHSABS 0.9  --  1.1  MONOABS 0.1  --  0.6  EOSABS 0.1  --  0.1  BASOSABS 0.0  --  0.1    Recent Labs  Lab 11/23/21 1344 11/24/21 0537 11/25/21 0310  NA 137 136 135  K 3.6 3.2* 3.7  CL 102 102 102  CO2 '26 26 25  '$ GLUCOSE 106* 101* 106*  BUN 14 7* 9  CREATININE 0.73 0.70 0.62  CALCIUM 8.2* 8.4* 8.2*  AST 50* 43* 41  ALT 45* 46* 44  ALKPHOS 94 101 117  BILITOT 0.9 0.7 0.6  ALBUMIN 3.0* 2.9* 2.8*  MG  --   --  1.5*    ------------------------------------------------------------------------------------------------------------------ No results for input(s): "CHOL", "HDL", "LDLCALC", "TRIG", "CHOLHDL", "LDLDIRECT" in the last 72 hours.  Lab Results  Component Value Date   HGBA1C 5.7 (H) 11/01/2016    ------------------------------------------------------------------------------------------------------------------ No results for input(s): "TSH", "T4TOTAL", "T3FREE", "THYROIDAB" in the last 72 hours.  Invalid input(s): "FREET3"  Cardiac Enzymes No results for input(s): "CKMB", "TROPONINI", "MYOGLOBIN" in the last 168 hours.  Invalid input(s): "CK" ------------------------------------------------------------------------------------------------------------------ No results found for: "BNP"  CBG: No results for input(s): "GLUCAP" in the last 168 hours.  Recent Results (from the past 240 hour(s))  Urine Culture     Status: Abnormal (Preliminary result)   Collection Time: 11/23/21  3:44 PM   Specimen: Urine, Catheterized  Result Value Ref Range Status   Specimen Description   Final    URINE, CATHETERIZED Performed at [facility] [address], [place], [place] [address]    Special Requests  Final    NONE Performed at [facility], [place] [address] [place], [place] [address]    Culture (A)  Final    >=100,000 COLONIES/mL PROTEUS MIRABILIS 60,000 COLONIES/mL ESCHERICHIA COLI SUSCEPTIBILITIES TO FOLLOW Performed at [facility], [place] [address] [place], [place] [address]    Report Status PENDING  Incomplete   Organism ID, Bacteria PROTEUS MIRABILIS (A)  Final      Susceptibility   Proteus mirabilis - MIC*    AMPICILLIN <=2 SENSITIVE Sensitive     CEFAZOLIN <=4 SENSITIVE Sensitive     CEFEPIME <=0.12 SENSITIVE Sensitive     CEFTRIAXONE <=0.25 SENSITIVE Sensitive     CIPROFLOXACIN <=0.25 SENSITIVE Sensitive     GENTAMICIN <=1 SENSITIVE Sensitive     IMIPENEM 2 SENSITIVE Sensitive     NITROFURANTOIN 128 RESISTANT Resistant     TRIMETH/SULFA <=20 SENSITIVE Sensitive     AMPICILLIN/SULBACTAM <=2 SENSITIVE Sensitive     PIP/TAZO <=4 SENSITIVE Sensitive     * >=100,000 COLONIES/mL PROTEUS MIRABILIS  Blood culture (routine x 2)      Status: None (Preliminary result)   Collection Time: 11/23/21  5:20 PM   Specimen: BLOOD  Result Value Ref Range Status   Specimen Description   Final    BLOOD RIGHT ANTECUBITAL Performed at [facility] [address], [place], [place] [address]    Special Requests   Final    BOTTLES DRAWN AEROBIC AND ANAEROBIC Blood Culture adequate volume Performed at [facility] [address], [place], [place] [address]    Culture   Final    NO GROWTH 2 DAYS Performed at [facility] [address], [place], [place] [address]    Report Status PENDING  Incomplete  Blood culture (routine x 2)     Status: None (Preliminary result)   Collection Time: 11/23/21  5:35 PM   Specimen: BLOOD  Result Value Ref Range Status   Specimen Description   Final    BLOOD BLOOD LEFT HAND Performed at [facility] [address] [place], [place] [address]    Special Requests   Final    BOTTLES DRAWN AEROBIC AND ANAEROBIC Blood Culture results may not be optimal due to an inadequate volume of blood received in culture bottles Performed at [facility] [address] [place], [place] [address]    Culture   Final    NO GROWTH 2 DAYS Performed at [facility] [address], [place], [place] [address]    Report Status PENDING  Incomplete     Radiology Studies: No results found.   Eulogio Bear DO [facility]  Between 7 am - 7 pm I am available, please contact me via Amion (for emergencies) or Securechat (non urgent messages)  Between 7 pm - 7 am I am not available, please contact night coverage MD/APP via Amion

## 2021-11-25 NOTE — Evaluation (Signed)
Occupational Therapy Evaluation Patient Details Name: Regina James MRN: [id number] DOB: [birth year] Today's Date: 11/25/2021   History of Present Illness 88 year old female with right ovarian epithelial cancer on active chemotherapy with carboplatin and paclitaxel, history of embolic CVA, HTN, HLD who comes into the hospital with weakness, abdominal pain, nausea.  She recently started chemotherapy a week ago, she is on cycle #1/3 every 21 days.  She felt well initially but over the last 3 days she has been having a poor appetite, weakness to the point that she is having difficulties walking, and intermittent abdominal pain   Clinical Impression   Regina James is an 88 year old woman who is typically independent and lives alone is now s/p her first chemo treatment with generalized weakness, decreased activity tolerance and impaired balance. ON evaluation she is min guard for in room ambulation holding on to IV pole and ADLs. Her cognition appeared functional till end of treatment when she attempted to open her pitcher and stated "what is this?" RN reports she can get confused at night. She was somewhat irritable with therapist when discussing need to get out of bed and ambulate to bathroom to maintain mobility and strength. She has Depends in the room. Patient will benefit from skilled OT services while in hospital to improve deficits and learn compensatory strategies as needed in order to return to PLOF.  She reports she has a daughter that can help her, works from home and lives about a mile away.      Recommendations for follow up therapy are one component of a multi-disciplinary discharge planning process, led by the attending physician.  Recommendations may be updated based on patient status, additional functional criteria and insurance authorization.   Follow Up Recommendations  No OT follow up    Assistance Recommended at Discharge Intermittent Supervision/Assistance  Patient can  return home with the following A little help with walking and/or transfers;A little help with bathing/dressing/bathroom;Assistance with cooking/housework;Help with stairs or ramp for entrance;Assist for transportation;Direct supervision/assist for medications management;Direct supervision/assist for financial management    Functional Status Assessment  Patient has had a recent decline in their functional status and demonstrates the ability to make significant improvements in function in a reasonable and predictable amount of time.  Equipment Recommendations  None recommended by OT    Recommendations for Other Services       Precautions / Restrictions Precautions Precautions: Fall Precaution Comments: urinary urgency, needs briefs Restrictions Weight Bearing Restrictions: No      Mobility Bed Mobility Overal bed mobility: Needs Assistance Bed Mobility: Supine to Sit     Supine to sit: Supervision     General bed mobility comments: Able to sit up but had an immediate loss of balance to the right. Able to eventually get feet on the floor and remain at midline safely.    Transfers Overall transfer level: Needs assistance Equipment used: None   Sit to Stand: Min guard     Step pivot transfers: Min guard     General transfer comment: Min guard to ambulate to bathroom holding on to IV pole. Gait appears to be impaired. Patient says she doesn't typically walk that way but could be due to IV pole placement.      Balance Overall balance assessment: Mild deficits observed, not formally tested  ADL either performed or assessed with clinical judgement   ADL Overall ADL's : Needs assistance/impaired Eating/Feeding: Independent   Grooming: Min guard   Upper Body Bathing: Set up   Lower Body Bathing: Min guard;Sit to/from stand   Upper Body Dressing : Independent   Lower Body Dressing: Min guard;Sit to/from  stand Lower Body Dressing Details (indicate cue type and reason): able to don  socks at edge of bed Toilet Transfer: Min guard;Grab bars   Toileting- Clothing Manipulation and Hygiene: Min guard;Sit to/from stand       Functional mobility during ADLs: Min guard General ADL Comments: Min guard to ambulate in room holding on to IV pole.     Vision Patient Visual Report: No change from baseline       Perception     Praxis      Pertinent Vitals/Pain Pain Assessment Pain Assessment: 0-10 Pain Score: 8  Pain Location: generalized Pain Descriptors / Indicators: Sore Pain Intervention(s): Monitored during session, Patient requesting pain meds-RN notified     Hand Dominance Right   Extremity/Trunk Assessment Upper Extremity Assessment Upper Extremity Assessment: RUE deficits/detail;LUE deficits/detail RUE Deficits / Details: WFL ROM, grossly 5/5 RUE Sensation: WNL RUE Coordination: WNL LUE Deficits / Details: WFL ROM, 4/5 shoulder, 4+/5 elbow, 5/5 wrist, grip equal to right LUE Sensation: WNL LUE Coordination: WNL (functional coordination.)   Lower Extremity Assessment Lower Extremity Assessment: Defer to PT evaluation   Cervical / Trunk Assessment Cervical / Trunk Assessment: Normal   Communication Communication Communication: No difficulties   Cognition Arousal/Alertness: Awake/alert Behavior During Therapy: WFL for tasks assessed/performed Overall Cognitive Status: Within Functional Limits for tasks assessed                                 General Comments: Overall functional however at end of treatment was struggling to open pitcher and patient asked "What is this?" RN reports patient can get confused at night.     General Comments       Exercises     Shoulder Instructions      Home Living Family/patient expects to be discharged to:: Private residence Living Arrangements: Alone Available Help at Discharge: Family;Available 24  hours/day Type of Home: House Home Access: Stairs to enter [employer] of Steps: to sidewalk, 2 to front door Entrance Stairs-Rails: Right;Left Home Layout: Two level;Bed/bath upstairs Alternate Level Stairs-Number of Steps: 8, a landing and 4 more Alternate Level Stairs-Rails: Left Bathroom Shower/Tub: Occupational psychologist: Handicapped height Bathroom Accessibility: Yes   Home Equipment: [place] - single point;Shower seat - built in          Prior Functioning/Environment Prior Level of Function : Independent/Modified Independent                        OT Problem List: Decreased activity tolerance;Impaired balance (sitting and/or standing);Decreased safety awareness;Decreased knowledge of use of DME or AE;Pain;Decreased strength      OT Treatment/Interventions: Self-care/ADL training;Therapeutic exercise;DME and/or AE instruction;Therapeutic activities;Balance training;Patient/family education    OT Goals(Current goals can be found in the care plan section) Acute Rehab OT Goals Patient Stated Goal: Did not state Time For Goal Achievement: 12/09/21 Potential to Achieve Goals: Good  OT Frequency: Min 2X/week    Co-evaluation              AM-PAC OT "6 Clicks" Daily Activity     Outcome Measure  Help from another person eating meals?: None Help from another person taking care of personal grooming?: A Little Help from another person toileting, which includes using toliet, bedpan, or urinal?: A Little Help from another person bathing (including washing, rinsing, drying)?: A Little Help from another person to put on and taking off regular upper body clothing?: A Little Help from another person to put on and taking off regular lower body clothing?: A Little 6 Click Score: 19   End of Session Equipment Utilized During Treatment: Gait belt Nurse Communication: Mobility status;Patient requests pain meds  Activity Tolerance: Patient tolerated  treatment well Patient left: in chair;with call bell/phone within reach  OT Visit Diagnosis: Unsteadiness on feet (R26.81);Muscle weakness (generalized) (M62.81)                Time: 2902-1115 OT Time Calculation (min): 18 min Charges:  OT General Charges $OT Visit: 1 Visit OT Evaluation $OT Eval Low Complexity: 1 Low  Loye Vento, OTR/L [place]  Office [phone] Pager: [place] 11/25/2021, 9:10 AM

## 2021-11-25 NOTE — TOC Initial Note (Signed)
Transition of Care [facility]) - Initial/Assessment Note    Patient Details  Name: Regina James MRN: [id number] Date of Birth: 16-Aug-1933  Transition of [facility]) CM/SW Contact:    Vassie Moselle, LCSW Phone Number: 11/25/2021, 9:40 AM  Clinical Narrative:                 Met with pt to discuss recommendations for HHPT. Pt was alert and oriented x 4 during conversation. Pt was irritable throughout conversation. Pt states she already has home health services however, was unable to provide name of agency or type of home health services being provided but, states her daughter would know. Pt agreeable to CSW contacting daughter.  CSW spoke with daughter via telephone call to discuss HHPT recommendation and current services. Per pt's daughter her brothers have been working to get PCS/home health aide arranged for this pt. She is unsure whether or not they would like to have HHPT set up. Pt's daughter plans to speak with her brothers and pt's MD prior to making decision for HHPT. CSW will await return call from daughter with determination.    Expected Discharge Plan: [place] Barriers to Discharge: Continued Medical Work up   Patient Goals and CMS Choice Patient states their goals for this hospitalization and ongoing recovery are:: "To go home"   Choice offered to / list presented to : Patient, Adult Children  Expected Discharge Plan and Services Expected Discharge Plan: [place] In-house Referral: Clinical Social Work Discharge Planning Services: CM Consult   Living arrangements for the past 2 months: Apartment                 DME Arranged: N/A DME Agency: NA                  Prior Living Arrangements/Services Living arrangements for the past 2 months: Apartment Lives with:: Self Patient language and need for interpreter reviewed:: Yes Do you feel safe going back to the place where you live?: Yes      Need for Family Participation in  Patient Care: Yes (Comment) Care giver support system in place?: Yes (comment) Current home services: DME, Homehealth aide Criminal Activity/Legal Involvement Pertinent to Current Situation/Hospitalization: No - Comment as needed  Activities of Daily Living Home Assistive Devices/Equipment: Eyeglasses, Shower chair with back ADL Screening (condition at time of admission) Patient's cognitive ability adequate to safely complete daily activities?: Yes Is the patient deaf or have difficulty hearing?: Yes Does the patient have difficulty seeing, even when wearing glasses/contacts?: Yes Does the patient have difficulty concentrating, remembering, or making decisions?: Yes Patient able to express need for assistance with ADLs?: Yes Does the patient have difficulty dressing or bathing?: Yes Independently performs ADLs?: No Communication: Independent Dressing (OT): Independent Grooming: Independent Feeding: Independent Bathing: Independent Toileting: Needs assistance Is this a change from baseline?: Pre-admission baseline In/Out Bed: Needs assistance Is this a change from baseline?: Pre-admission baseline Walks in Home: Needs assistance Is this a change from baseline?: Pre-admission baseline Does the patient have difficulty walking or climbing stairs?: Yes Weakness of Legs: Both Weakness of Arms/Hands: Both  Permission Sought/Granted Permission sought to share information with : Family Supports Permission granted to share information with : Yes, Verbal Permission Granted  Share Information with NAME: [name] Livia Snellen     Permission granted to share info w Relationship: Daughter  Permission granted to share info w Contact Information: [phone]  Emotional Assessment Appearance:: Appears stated age  Attitude/Demeanor/Rapport: Guarded, Other (comment) (Irritable) Affect (typically observed): Agitated, Irritable Orientation: : Oriented to Self, Oriented to Place, Oriented to  Time, Oriented  to Situation Alcohol / Substance Use: Not Applicable Psych Involvement: No (comment)  Admission diagnosis:  UTI (urinary tract infection) [N39.0] Acute cystitis with hematuria [N30.01] Malignant neoplasm of ovary, unspecified laterality ([place]) [C56.9] Other hydronephrosis [N13.39] Urinary tract infection with hematuria, site unspecified [N39.0, R31.9] Sepsis, due to unspecified organism, unspecified whether acute organ dysfunction present [facility]) [A41.9] Patient Active Problem List   Diagnosis Date Noted   UTI (urinary tract infection) 11/23/2021   Leukopenia due to antineoplastic chemotherapy ([place]) 11/23/2021   Volume depletion 11/23/2021   Malignant neoplasm of ovary ([place]) 11/09/2021   Pelvic mass in female    Other ascites    Carotid stenosis, left 01/03/2017   History of CEA (carotid endarterectomy) 01/03/2017   Thoracic aortic aneurysm without rupture ([place]) 01/03/2017   Subclavian artery stenosis, left ([place]) 01/03/2017   Right sided weakness    History of basal cell carcinoma    PSVT (paroxysmal supraventricular tachycardia) ([place])    History of CVA with residual deficit    Medically noncompliant    Acute blood loss anemia    Dysphagia, post-stroke    Leukocytosis    Tachypnea    Mixed dyslipidemia 11/01/2016   Essential hypertension 11/01/2016   Tachyarrhythmia 11/01/2016   Internal carotid artery stenosis, left 11/01/2016   Aortic arch aneurysm ([place]) 11/01/2016   History of cerebrovascular accident (CVA) due to embolism 10/31/2016   Right arm weakness 06/27/2016   Right arm numbness 06/27/2016   Deviated septum 06/27/2016   Disorder of ethmoidal sinus 06/27/2016   Acute ischemic stroke [facility])    Dysarthria    Paroxysmal SVT (supraventricular tachycardia) ([place]) 05/21/2016   Angiopathy, peripheral ([place]) 01/07/2015   Cloudy posterior capsule 06/01/2013   Pseudoaphakia 05/02/2013   Malaise and fatigue 02/16/2013   Cataract, nuclear 01/28/2012   Palpitations 02/17/2011    Claudication ([place]) 02/17/2011   Tobacco abuse 02/17/2011   PCP:  Kathyrn Lass, MD Pharmacy:   [place], [place] [address] Phone: [phone] Fax: [phone]     Social Determinants of Health (SDOH) Interventions    Readmission Risk Interventions    11/25/2021    9:36 AM  Readmission Risk Prevention Plan  Transportation Screening Complete  PCP or Specialist Appt within 5-7 Days Complete  Home Care Screening Complete  Medication Review (RN CM) Complete

## 2021-11-25 NOTE — Progress Notes (Signed)
   11/25/21 2158  Vitals  Temp (!) 101 F (38.3 C)  Temp Source Oral   Given prn dose of tylenol per provider d/t temp

## 2021-11-26 ENCOUNTER — Inpatient Hospital Stay (HOSPITAL_COMMUNITY): Payer: Medicare Other

## 2021-11-26 ENCOUNTER — Inpatient Hospital Stay: Payer: Medicare Other | Admitting: Gynecologic Oncology

## 2021-11-26 DIAGNOSIS — Z8673 Personal history of transient ischemic attack (TIA), and cerebral infarction without residual deficits: Secondary | ICD-10-CM | POA: Diagnosis not present

## 2021-11-26 DIAGNOSIS — J9601 Acute respiratory failure with hypoxia: Secondary | ICD-10-CM

## 2021-11-26 DIAGNOSIS — N3001 Acute cystitis with hematuria: Secondary | ICD-10-CM | POA: Diagnosis not present

## 2021-11-26 DIAGNOSIS — C569 Malignant neoplasm of unspecified ovary: Secondary | ICD-10-CM

## 2021-11-26 LAB — URINE CULTURE: Culture: >=100000 — AB

## 2021-11-26 LAB — CBC
HCT: 35.5 % — ABNORMAL LOW (ref 36.0–46.0)
Hemoglobin: 12 g/dL (ref 12.0–15.0)
MCH: 31.1 pg (ref 26.0–34.0)
MCHC: 33.8 g/dL (ref 30.0–36.0)
MCV: 92 fL (ref 80.0–100.0)
Platelets: 261 10*3/uL (ref 150–400)
RBC: 3.86 MIL/uL — ABNORMAL LOW (ref 3.87–5.11)
RDW: 15.1 % (ref 11.5–15.5)
WBC: 2.7 10*3/uL — ABNORMAL LOW (ref 4.0–10.5)
nRBC: 0 % (ref 0.0–0.2)

## 2021-11-26 LAB — BASIC METABOLIC PANEL
Anion gap: 7 (ref 5–15)
BUN: 8 mg/dL (ref 8–23)
CO2: 24 mmol/L (ref 22–32)
Calcium: 7.8 mg/dL — ABNORMAL LOW (ref 8.9–10.3)
Chloride: 104 mmol/L (ref 98–111)
Creatinine, Ser: 0.63 mg/dL (ref 0.44–1.00)
GFR, Estimated: >60 mL/min (ref >60)
Glucose, Bld: 103 mg/dL — ABNORMAL HIGH (ref 70–99)
Potassium: 3.2 mmol/L — ABNORMAL LOW (ref 3.5–5.1)
Sodium: 135 mmol/L (ref 135–145)

## 2021-11-26 LAB — GLUCOSE, CAPILLARY: Glucose-Capillary: 107 mg/dL — ABNORMAL HIGH (ref 70–99)

## 2021-11-26 MED ORDER — ALBUTEROL SULFATE (2.5 MG/3ML) 0.083% IN NEBU
2.5000 mg | INHALATION_SOLUTION | Freq: Once | RESPIRATORY_TRACT | Status: AC | PRN
Start: 2021-11-26 — End: 2021-11-26
  Filled 2021-11-26: qty 3

## 2021-11-26 MED ORDER — CEFADROXIL 500 MG PO CAPS
500.0000 mg | ORAL_CAPSULE | Freq: Two times a day (BID) | ORAL | Status: DC
  Administered 2021-11-26 – 2021-11-28 (×5): 500 mg via ORAL
  Filled 2021-11-26 (×5): qty 1

## 2021-11-26 MED ORDER — POTASSIUM CHLORIDE CRYS ER 20 MEQ PO TBCR
40.0000 meq | EXTENDED_RELEASE_TABLET | Freq: Once | ORAL | Status: AC
Start: 2021-11-26 — End: 2021-11-26
  Administered 2021-11-26: 40 meq via ORAL
  Filled 2021-11-26: qty 2

## 2021-11-26 MED ORDER — SODIUM CHLORIDE 0.9 % IV SOLN: INTRAVENOUS | Status: DC

## 2021-11-26 MED ORDER — FUROSEMIDE 10 MG/ML IJ SOLN
40.0000 mg | Freq: Once | INTRAMUSCULAR | Status: AC
  Administered 2021-11-26: 40 mg via INTRAVENOUS
  Filled 2021-11-26: qty 4

## 2021-11-26 MED ORDER — HALOPERIDOL 0.5 MG PO TABS: 0.5000 mg | ORAL_TABLET | Freq: Three times a day (TID) | ORAL | Status: DC | PRN

## 2021-11-26 MED ORDER — HALOPERIDOL LACTATE 5 MG/ML IJ SOLN: 1.0000 mg | Freq: Four times a day (QID) | INTRAMUSCULAR | Status: DC | PRN

## 2021-11-26 MED ORDER — MELATONIN 3 MG PO TABS
3.0000 mg | ORAL_TABLET | Freq: Every evening | ORAL | Status: DC | PRN
  Administered 2021-11-26 – 2021-11-27 (×2): 3 mg via ORAL
  Filled 2021-11-26 (×2): qty 1

## 2021-11-26 MED ORDER — ALBUTEROL SULFATE (2.5 MG/3ML) 0.083% IN NEBU
INHALATION_SOLUTION | RESPIRATORY_TRACT | Status: AC
  Filled 2021-11-26: qty 3

## 2021-11-26 NOTE — Progress Notes (Addendum)
Physical Therapy Treatment Patient Details Name: Regina James MRN: [id number] DOB: Apr 03, 1934 Today's Date: 11/26/2021   History of Present Illness 88 year old female with right ovarian epithelial cancer on active chemotherapy with carboplatin and paclitaxel, history of embolic CVA, HTN, HLD who comes into the hospital with weakness, abdominal pain, nausea.  She recently started chemotherapy a week ago, she is on cycle #1/3 every 21 days.  She felt well initially but over the last 3 days she has been having a poor appetite, weakness to the point that she is having difficulties walking, and intermittent abdominal pain    PT Comments    Pt assisted with ambulating however only able to tolerate short distance due to fatigue and reported LE weakness.   *Pt reported this morning that she would have family assist at home and per [facility] team, family is reporting pt will not have 24/7 care available.  Updated d/c recommendations for SNF as pt could benefit from rehab for strengthening, endurance and safety with mobilizing.    Recommendations for follow up therapy are one component of a multi-disciplinary discharge planning process, led by the attending physician.  Recommendations may be updated based on patient status, additional functional criteria and insurance authorization.  Follow Up Recommendations  SNF     Assistance Recommended at Discharge Intermittent Supervision/Assistance  Patient can return home with the following A little help with walking and/or transfers;Help with stairs or ramp for entrance;Assist for transportation;Assistance with cooking/housework   Equipment Recommendations  Rolling walker (2 wheels)    Recommendations for Other Services       Precautions / Restrictions Precautions Precautions: Fall Precaution Comments: urinary urgency, needs briefs     Mobility  Bed Mobility Overal bed mobility: Needs Assistance Bed Mobility: Supine to Sit     Supine to sit:  Supervision          Transfers Overall transfer level: Needs assistance Equipment used: None Transfers: Sit to/from Stand Sit to Stand: Min guard           General transfer comment: min/guard for safety    Ambulation/Gait Ambulation/Gait assistance: Min assist, Min guard Gait Distance (Feet): 40 Feet Assistive device: IV Pole Gait Pattern/deviations: Step-through pattern, Decreased stride length Gait velocity: decr     General Gait Details: pt pushed IV pole, one instance of LOB with head turn, pt agreeable to have family assist with mobility at home initially   Stairs             Wheelchair Mobility    Modified Rankin (Stroke Patients Only)       Balance                                            Cognition Arousal/Alertness: Awake/alert Behavior During Therapy: WFL for tasks assessed/performed Overall Cognitive Status: Within Functional Limits for tasks assessed                                          Exercises      General Comments        Pertinent Vitals/Pain Pain Assessment Pain Assessment: 0-10 Faces Pain Scale: Hurts even more Pain Location: generalized Pain Descriptors / Indicators: Sore Pain Intervention(s): Monitored during session, Repositioned    Home Living  Prior Function            PT Goals (current goals can now be found in the care plan section) Progress towards PT goals: Progressing toward goals    Frequency    Min 3X/week      PT Plan Current plan remains appropriate    Co-evaluation              AM-PAC PT "6 Clicks" Mobility   Outcome Measure  Help needed turning from your back to your side while in a flat bed without using bedrails?: None Help needed moving from lying on your back to sitting on the side of a flat bed without using bedrails?: None Help needed moving to and from a bed to a chair (including a wheelchair)?: A  Little Help needed standing up from a chair using your arms (e.g., wheelchair or bedside chair)?: A Little Help needed to walk in hospital room?: A Little Help needed climbing 3-5 steps with a railing? : A Lot 6 Click Score: 19    End of Session Equipment Utilized During Treatment: Gait belt Activity Tolerance: Patient limited by fatigue Patient left: in chair;with call bell/phone within reach (pt agreeable to use call bell for assist out of chair)   PT Visit Diagnosis: Muscle weakness (generalized) (M62.81);Difficulty in walking, not elsewhere classified (R26.2)     Time: 1610-9604 PT Time Calculation (min) (ACUTE ONLY): 22 min  Charges:  $Gait Training: 8-22 mins                     Jannette Spanner PT, DPT Physical Therapist Acute Rehabilitation Services Preferred contact method: Secure Chat Weekend Pager Only: [phone] Office: [place] 11/26/2021, 12:02 PM

## 2021-11-26 NOTE — TOC Progression Note (Addendum)
Transition of Care [facility]) - Progression Note    Patient Details  Name: MERCED BROUGHAM MRN: [id number] Date of Birth: 1933/10/22  Transition of Care [facility]) CM/SW [place], LCSW Phone Number: 11/26/2021, 2:55 PM  Clinical Narrative:    CSW spoke with pt's sister over the phone along with her brothers and representative for private home health agency. Pt's family are concerned with plan for pt returning home w/ home health as family lives out of town and they will be unable to secure 24/7 supervision of pt for at least a week following her discharge. Pt's family are interested in SNF placement for this pt vs HH.   Update 1500: Pt has been faxed out for SNF placement and currently awaiting bed offers  Expected Discharge Plan: [place] Barriers to Discharge: Continued Medical Work up  Expected Discharge Plan and Services Expected Discharge Plan: [place] In-house Referral: Clinical Social Work Discharge Planning Services: CM Consult   Living arrangements for the past 2 months: Apartment                 DME Arranged: N/A DME Agency: NA                   Social Determinants of Health (SDOH) Interventions    Readmission Risk Interventions    11/25/2021    9:36 AM  Readmission Risk Prevention Plan  Transportation Screening Complete  PCP or Specialist Appt within 5-7 Days Complete  Home Care Screening Complete  Medication Review (RN CM) Complete

## 2021-11-26 NOTE — Plan of Care (Signed)
?  Problem: Activity: ?Goal: Risk for activity intolerance will decrease ?Outcome: Progressing ?  ?Problem: Nutrition: ?Goal: Adequate nutrition will be maintained ?Outcome: Progressing ?  ?Problem: Elimination: ?Goal: Will not experience complications related to urinary retention ?Outcome: Progressing ?  ?Problem: Pain Managment: ?Goal: General experience of comfort will improve ?Outcome: Progressing ?  ?Problem: Safety: ?Goal: Ability to remain free from injury will improve ?Outcome: Progressing ?  ?Problem: Skin Integrity: ?Goal: Risk for impaired skin integrity will decrease ?Outcome: Progressing ?  ?

## 2021-11-26 NOTE — NC FL2 (Signed)
[place] LEVEL OF CARE SCREENING TOOL     IDENTIFICATION  Patient Name: Regina James Birthdate: 1933-10-24 Sex: female Admission Date (Current Location): 11/23/2021  [facility] and [place] Number:  Herbalist and Address:  [facility],  [place], [place]      Provider Number: [id number]  Attending Physician Name and Address:  Geradine Girt, DO  Relative Name and Phone Number:  Daughter, Santina Evans [phone]    Current Level of Care: Hospital Recommended Level of Care: [place] Prior Approval Number:    Date Approved/Denied:   PASRR Number: [id number]  Discharge Plan: SNF    Current Diagnoses: Patient Active Problem List   Diagnosis Date Noted   UTI (urinary tract infection) 11/23/2021   Leukopenia due to antineoplastic chemotherapy ([place]) 11/23/2021   Volume depletion 11/23/2021   Malignant neoplasm of ovary ([place]) 11/09/2021   Pelvic mass in female    Other ascites    Carotid stenosis, left 01/03/2017   History of CEA (carotid endarterectomy) 01/03/2017   Thoracic aortic aneurysm without rupture ([place]) 01/03/2017   Subclavian artery stenosis, left ([place]) 01/03/2017   Right sided weakness    History of basal cell carcinoma    PSVT (paroxysmal supraventricular tachycardia) ([place])    History of CVA with residual deficit    Medically noncompliant    Acute blood loss anemia    Dysphagia, post-stroke    Leukocytosis    Tachypnea    Mixed dyslipidemia 11/01/2016   Essential hypertension 11/01/2016   Tachyarrhythmia 11/01/2016   Internal carotid artery stenosis, left 11/01/2016   Aortic arch aneurysm ([place]) 11/01/2016   History of cerebrovascular accident (CVA) due to embolism 10/31/2016   Right arm weakness 06/27/2016   Right arm numbness 06/27/2016   Deviated septum 06/27/2016   Disorder of ethmoidal sinus 06/27/2016   Acute ischemic stroke [facility])    Dysarthria    Paroxysmal SVT  (supraventricular tachycardia) ([place]) 05/21/2016   Angiopathy, peripheral ([place]) 01/07/2015   Cloudy posterior capsule 06/01/2013   Pseudoaphakia 05/02/2013   Malaise and fatigue 02/16/2013   Cataract, nuclear 01/28/2012   Palpitations 02/17/2011   Claudication ([place]) 02/17/2011   Tobacco abuse 02/17/2011    Orientation RESPIRATION BLADDER Height & Weight     Self, Situation, Place  Normal Incontinent, External catheter Weight: 170 lb 10.2 oz (77.4 kg) Height:  '5\' 6"'$  (167.6 cm)  BEHAVIORAL SYMPTOMS/MOOD NEUROLOGICAL BOWEL NUTRITION STATUS      Incontinent Diet (Regular)  AMBULATORY STATUS COMMUNICATION OF NEEDS Skin   Limited Assist Verbally Normal                       Personal Care Assistance Level of Assistance  Bathing, Feeding, Dressing Bathing Assistance: Independent Feeding assistance: Independent Dressing Assistance: Independent     Functional Limitations Info  Sight, Hearing, Speech Sight Info: Impaired Hearing Info: Impaired Speech Info: Adequate    SPECIAL CARE FACTORS FREQUENCY  PT (By licensed PT)     PT Frequency: 5x/wk              Contractures Contractures Info: Not present    Additional Factors Info  Code Status, Allergies Code Status Info: FULL Allergies Info: Losartan, Dorzolamide Hcl-timolol Mal, Morphine And Related, Levofloxacin           Current Medications (11/26/2021):  This is the current hospital active medication list Current Facility-Administered Medications  Medication Dose Route Frequency Provider Last Rate Last Admin  0.9 %  sodium chloride infusion   Intravenous Continuous Alvy Bimler, Ni, MD   Stopping previously hung infusion at 11/26/21 1359   0.9 %  sodium chloride infusion   Intravenous Continuous Eulogio Bear U, DO 50 mL/hr at 11/26/21 1359 New Bag at 11/26/21 1359   acetaminophen (TYLENOL) tablet 650 mg  650 mg Oral Q6H PRN Lenore Cordia, MD   650 mg at 11/26/21 7026   Or   acetaminophen (TYLENOL) suppository 650  mg  650 mg Rectal Q6H PRN Lenore Cordia, MD       aspirin tablet 325 mg  325 mg Oral Daily Lenore Cordia, MD   325 mg at 11/26/21 3785   cefadroxil (DURICEF) capsule 500 mg  500 mg Oral BID Vann, Jessica U, DO   500 mg at 11/26/21 1242   Chlorhexidine Gluconate Cloth 2 % PADS 6 each  6 each Topical Daily Pattricia Boss, MD   6 each at 11/26/21 0853   enoxaparin (LOVENOX) injection 40 mg  40 mg Subcutaneous Q24H Zada Finders R, MD   40 mg at 11/25/21 2133   feeding supplement (KATE FARMS STANDARD 1.4) liquid 325 mL  325 mL Oral BID BM Vann, Jessica U, DO   325 mL at 11/25/21 1427   latanoprost (XALATAN) 0.005 % ophthalmic solution 1 drop  1 drop Left Eye QHS Vann, Jessica U, DO   1 drop at 11/25/21 2131   magic mouthwash  5 mL Oral QID Eulogio Bear U, DO   5 mL at 11/26/21 1358   metoprolol succinate (TOPROL-XL) 24 hr tablet 50 mg  50 mg Oral Daily Caren Griffins, MD   50 mg at 11/26/21 0853   ondansetron (ZOFRAN) tablet 4 mg  4 mg Oral Q6H PRN Lenore Cordia, MD       Or   ondansetron (ZOFRAN) injection 4 mg  4 mg Intravenous Q6H PRN Lenore Cordia, MD       pravastatin (PRAVACHOL) tablet 20 mg  20 mg Oral q1800 Zada Finders R, MD   20 mg at 11/25/21 1746   sodium chloride flush (NS) 0.9 % injection 10-40 mL  10-40 mL Intracatheter Q12H Pattricia Boss, MD   10 mL at 11/23/21 2344   sodium chloride flush (NS) 0.9 % injection 10-40 mL  10-40 mL Intracatheter PRN Pattricia Boss, MD       sodium chloride flush (NS) 0.9 % injection 10-40 mL  10-40 mL Intracatheter Q12H Patel, Vishal R, MD       timolol (TIMOPTIC) 0.5 % ophthalmic solution 1 drop  1 drop Left Eye BID Eulogio Bear U, DO   1 drop at 11/26/21 8850     Discharge Medications: Please see discharge summary for a list of discharge medications.  Relevant Imaging Results:  Relevant Lab Results:   Additional Information SSN: [id number]  Vassie Moselle, LCSW

## 2021-11-26 NOTE — Progress Notes (Signed)
Patient's concerned with patient's confusion.  Patient has had episodes on and off for several days.  Suspect delirium.  Precautions placed and will check CT scan of brain. Regina James

## 2021-11-26 NOTE — Significant Event (Signed)
Rapid Response Event Note   Reason for Call :  Increased shortness of breath and tachypnea  Initial Focused Assessment:  Patient resting in bed with daughter at bedside. tachypnea noted but not in distress. SpO2 98% on room air. Lungs auscultated and wheezy in upper airway. Breathing 22-24/minute.  Temperature 94F, patient did receive chemo recently with a WBC 2.7, neutrophil 0.2.  Cardiac- HR 91 bpm, regular, pulses 2+ and equal. S1 and S2 auscultated.  Neuro- Alert to self, place and year. Incorrectly answered birthday and age. Pupils 3 mm equal and reactive bilaterally. Moves all extremities on command equally, she does feel weaker than normal.  Chest x ray and albuterol ordered per Rapid response protocol.  Post albuterol treatment- wheezing improved, no longer audible without stethoscope, Still heard in upper airway with stethoscope.    Interventions:  Albuterol and STAT chest x ray ordered per Rapid response protocol.   Plan of Care:  Monitor work of breathing and mental status. At end of rapid patient is alert and participating in conversation with bed side staff and daughter    Event Summary:   MD Notified: Oypd MD  Call Time: 2694 Arrival Time: 1858 End Time: 1930  Josph Macho, RN

## 2021-11-26 NOTE — Progress Notes (Signed)
Patient was noted to have increased WOB and increased RR around change of shift. CXR concerning for development of pulmonary edema. Plan to stop IVF and give a dose of Lasix.

## 2021-11-26 NOTE — Progress Notes (Signed)
Regina James   DOB:[birth year]   IO#:[id number]    ASSESSMENT & PLAN:  Right ovarian epithelial cancer ([place]) Currently receiving neoadjuvant chemotherapy with carboplatin and Taxol She had multiple complications including infection, slight worsening slurring of speech and dehydration Continue supportive care I plan future dose reduction for cycle 2   Urinary tract infection UA suggestive of UTI, urine culture pending Continue IV antibiotics and follow-up on urine culture She is also mildly dehydrated due to poor oral intake I recommend IV fluid support for 2 days   Leukopenia secondary to recent chemotherapy Given fever and signs of UTI but clinically, she does not have any evidence of septic shock Hold off G-CSF for now Continue to trend her CBC over the next 2 days Overall, her neutropenia is improving  Cerebrovascular accident (CVA) due to embolism of left middle cerebral artery ([name]) She has history of stroke and mild residual permanent right-sided weakness She is able to cope well with her mild disability She has excellent family support I recommend resumption of aspirin therapy   Tobacco abuse Unfortunately, she continues to smoke We discussed importance of nicotine cessation   Discharge planning Anticipate that she will be in the hospital for 2 to 3 days for monitoring of fever and to follow-up on cultures.  Plan of care is fully discussed with her daughter, Regina James I will check on her again on tomorrow All questions were answered. The patient knows to call the clinic with any problems, questions or concerns.   The total time spent in the appointment was 30 minutes encounter with patients including review of chart and various tests results, discussions about plan of care and coordination of care plan  [name], MD 11/26/2021 9:38 AM  Subjective:  She is seen this morning.  She had another fever episode yesterday.  She complained of dizziness when she tries to walk We  have a long discussion about the importance of nicotine cessation Her daughter is up-to-date with her current plan of care  Objective:  Vitals:   11/25/21 2158 11/26/21 0519  BP:  131/61  Pulse:  74  Resp:  16  Temp: (!) 101 F (38.3 C) 100.3 F (37.9 C)  SpO2:  96%     Intake/Output Summary (Last 24 hours) at 11/26/2021 8182 Last data filed at 11/26/2021 0542 Gross per 24 hour  Intake 2263 ml  Output 400 ml  Net 1863 ml    GENERAL:alert, no distress and comfortable NEURO: alert & oriented x 3 with fluent speech, no focal motor/sensory deficits   Labs:  Recent Labs    11/23/21 1344 11/24/21 0537 11/25/21 0310 11/26/21 0450  NA 137 136 135 135  K 3.6 3.2* 3.7 3.2*  CL 102 102 102 104  CO2 '26 26 25 24  '$ GLUCOSE 106* 101* 106* 103*  BUN 14 7* 9 8  CREATININE 0.73 0.70 0.62 0.63  CALCIUM 8.2* 8.4* 8.2* 7.8*  GFRNONAA >60 >60 >60 >60  PROT 6.1* 6.1* 6.1*  --   ALBUMIN 3.0* 2.9* 2.8*  --   AST 50* 43* 41  --   ALT 45* 46* 44  --   ALKPHOS 94 101 117  --   BILITOT 0.9 0.7 0.6  --     Studies:  CT ABDOMEN PELVIS W CONTRAST  Result Date: 11/23/2021 CLINICAL DATA:  Abdominal pain.  * Tracking Code: BO * EXAM: CT ABDOMEN AND PELVIS WITH CONTRAST TECHNIQUE: Multidetector CT imaging of the abdomen and pelvis was performed using the  standard protocol following bolus administration of intravenous contrast. RADIATION DOSE REDUCTION: This exam was performed according to the departmental dose-optimization program which includes automated exposure control, adjustment of the mA and/or kV according to patient size and/or use of iterative reconstruction technique. CONTRAST:  171m OMNIPAQUE IOHEXOL 300 MG/ML  SOLN COMPARISON:  10/22/2021 and MR pelvis 10/03/2021. FINDINGS: Lower chest: Trace right pleural effusion with adjacent compressive atelectasis in the right lower lobe. Subsegmental volume loss in the left lower lobe. Atherosclerotic calcification of the aorta and coronary arteries.  Catheter tip in the high right atrium. Heart is enlarged. No pericardial effusion. Distal esophagus is grossly unremarkable. Hepatobiliary: Liver is decreased in attenuation diffusely. Biliary ductal dilatation, likely related to cholecystectomy. Pancreas: Negative. Spleen: Negative. Adrenals/Urinary Tract: Adrenal glands are unremarkable. Horseshoe kidney. 3.3 cm fluid density lesion in the right kidney, compatible with a cyst. No follow-up necessary. 1.6 cm low-attenuation lesion in the midline renal parenchyma, too small to characterize. No specific follow-up necessary. Mild right hydronephrosis, new from 10/22/2021, likely due to external compression by a large cystic and solid mass in the anatomic pelvis. Left ureter is decompressed. Bladder is low in volume. Stomach/Bowel: Stomach, small bowel and colon are unremarkable. Appendix is not readily visualized. Vascular/Lymphatic: Atherosclerotic calcification of the aorta. Retroaortic left renal vein. No pathologically enlarged lymph nodes. Small bowel mesenteric lymph nodes are not enlarged by CT size criteria. Reproductive: Large cystic and solid mass in the right paramidline anatomic pelvis, measuring 9.6 x 15.6 cm (2/67), as on 10/22/2021. Mass compresses the uterus inferiorly. Other: Trace pelvic free fluid. 5 mm peritoneal nodule adjacent to the proximal descending colon (2/29), unchanged and indeterminate. Mesenteries and peritoneum are otherwise unremarkable. Musculoskeletal: Degenerative changes in the spine and hips. Minimal grade 1 anterolisthesis of L4 on L5. IMPRESSION: 1. Large cystic and solid mass in the right paramidline anatomic pelvis, compatible with ovarian carcinoma. 2. 5 mm lateral left abdominal peritoneal nodule, indeterminate. Recommend attention on follow-up. 3. Mild right hydronephrosis, likely secondary to compression by the aforementioned ovarian mass. 4. Trace right pleural effusion. 5. Hepatic steatosis. 6. Aortic atherosclerosis  (ICD10-I70.0). Coronary artery calcification. Electronically Signed   By: MLorin PicketM.D.   On: 11/23/2021 15:38   CT Head Wo Contrast  Result Date: 11/23/2021 CLINICAL DATA:  Neuro deficit, acute, stroke suspected. EXAM: CT HEAD WITHOUT CONTRAST TECHNIQUE: Contiguous axial images were obtained from the base of the skull through the vertex without intravenous contrast. RADIATION DOSE REDUCTION: This exam was performed according to the departmental dose-optimization program which includes automated exposure control, adjustment of the mA and/or kV according to patient size and/or use of iterative reconstruction technique. COMPARISON:  Head CT 06/12/2020 and MRI 11/04/2016 FINDINGS: Brain: There is no evidence of an acute infarct, intracranial hemorrhage, mass, midline shift, or extra-axial fluid collection. Small chronic cortical infarcts are again seen in the left frontal and left occipital lobes. Patchy hypodensities in the cerebral white matter bilaterally are unchanged and nonspecific but compatible with moderate chronic small vessel ischemic disease. There is mild cerebral atrophy. Vascular: Calcified atherosclerosis at the skull base. No hyperdense vessel. Skull: No fracture or suspicious osseous lesion. Sinuses/Orbits: Paranasal sinuses and mastoid air cells are clear. Bilateral cataract extraction. Other: None. IMPRESSION: 1. No evidence of acute intracranial abnormality. 2. Moderate chronic small vessel ischemic disease. Electronically Signed   By: ALogan BoresM.D.   On: 11/23/2021 15:29   IR IMAGING GUIDED PORT INSERTION  Result Date: 11/12/2021 INDICATION: 88year old female with history of right-sided  ovarian cancer. She presents for port catheter placement for chemotherapy. EXAM: IMPLANTED PORT A CATH PLACEMENT WITH ULTRASOUND AND FLUOROSCOPIC GUIDANCE MEDICATIONS: None. ANESTHESIA/SEDATION: Versed 1.5 mg IV; Fentanyl 50 mcg IV; Moderate Sedation Time:  17 minutes The patient's vital signs  and level of consciousness were continuously monitored during the procedure by the interventional radiology nurse under my direct supervision. FLUOROSCOPY: Radiation exposure index: 1 mGy reference air kerma COMPLICATIONS: None immediate. PROCEDURE: The right neck and chest was prepped with chlorhexidine, and draped in the usual sterile fashion using maximum barrier technique (cap and mask, sterile gown, sterile gloves, large sterile sheet, hand hygiene and cutaneous antiseptic). Local anesthesia was attained by infiltration with 1% lidocaine with epinephrine. Ultrasound demonstrated patency of the right internal jugular vein, and this was documented with an image. Under real-time ultrasound guidance, this vein was accessed with a 21 gauge micropuncture needle and image documentation was performed. A small dermatotomy was made at the access site with an 11 scalpel. A 0.018" wire was advanced into the SVC and the access needle exchanged for a 29F micropuncture vascular sheath. The 0.018" wire was then removed and a 0.035" wire advanced into the IVC. An appropriate location for the subcutaneous reservoir was selected below the clavicle and an incision was made through the skin and underlying soft tissues. The subcutaneous tissues were then dissected using a combination of blunt and sharp surgical technique and a pocket was formed. A single lumen power injectable portacatheter was then tunneled through the subcutaneous tissues from the pocket to the dermatotomy and the port reservoir placed within the subcutaneous pocket. The venous access site was then serially dilated and a peel away vascular sheath placed over the wire. The wire was removed and the port catheter advanced into position under fluoroscopic guidance. The catheter tip is positioned in the superior cavoatrial junction. This was documented with a spot image. The portacatheter was then tested and found to flush and aspirate well. The port was flushed with  saline followed by 100 units/mL heparinized saline. The pocket was then closed in two layers using first subdermal inverted interrupted absorbable sutures followed by a running subcuticular suture. The epidermis was then sealed with Dermabond. The dermatotomy at the venous access site was also closed with Dermabond. IMPRESSION: Successful placement of a right IJ approach Power Port with ultrasound and fluoroscopic guidance. The catheter is ready for use. Electronically Signed   By: Jacqulynn Cadet M.D.   On: 11/12/2021 16:24

## 2021-11-26 NOTE — Progress Notes (Signed)
Pt confused. Daughter at bedside, states "this is the worst I have ever seen her". Pt c/o difficulty breathing, O2 96% on room air, inspiratory wheezing noted. Eulogio Bear, DO notified. See new orders. Rapid response RN, Neoma Laming, to bedside. Jarrett Soho, Agricultural consultant, at bedside.

## 2021-11-26 NOTE — Progress Notes (Signed)
PROGRESS NOTE  Regina James IOX:[id number] DOB: 1934-02-22 DOA: 11/23/2021 PCP: Kathyrn Lass, MD   LOS: 2 days   Brief Narrative / Interim history: 88 year old female with right ovarian epithelial cancer on active chemotherapy with carboplatin and paclitaxel, history of embolic CVA, HTN, HLD who comes into the hospital with weakness, abdominal pain, nausea.  She recently started chemotherapy a week ago, she is on cycle #1/3 every 21 days.  She felt well initially but over the last 3 days she has been having a poor appetite, weakness to the point that she is having difficulties walking, and intermittent abdominal pain.  She occasionally reports difficulties with urination/incontinence due to the mass effect on the bladder.  She also had some chills at home  Pertinent data: CT head 8/21-no acute intracranial abnormalities, chronic small vessel ischemic disease CT abdomen pelvis 8/21-mass in the right paramidline anatomic pelvis, compatible with ovarian carcinoma, 5 mm lateral left abdominal peritoneal nodule that needs follow-up.  Mild right hydronephrosis likely secondary to ovarian mass.  Subjective / 24h Interval events: Had a fever overnight  Assesement and Plan: Principal Problem:   UTI (urinary tract infection) Active Problems:   Volume depletion   Malignant neoplasm of ovary (HCC)   Leukopenia due to antineoplastic chemotherapy ([facility]CC)   Paroxysmal SVT (supraventricular tachycardia) (HCC)   History of cerebrovascular accident (CVA) due to embolism   Urinary tract infection-this is in the setting of immunosuppression due to chemotherapy, leukopenia.  Urinalysis on admission was concerning for UTI and she does have symptoms.  She was started on IV ceftriaxone-- change to PO abx -culture shows: proteus -home one afebrile  Dehydration --received IV fluids, encourage p.o. intake today along with gentle IVF  Hypokalemia -replete  Right ovarian epithelial cancer -Follows with  oncology, Dr. Alvy Bimler.  Recently started chemotherapy with carboplatin and paclitaxel on 8/15.  Imaging on admission showed mild right-sided hydronephrosis, she does not have significant symptoms with it and her kidney function is normal.  Continue to monitor clinically   History of CVA -Continue aspirin and pravastatin.   Paroxysmal SVT-Home Toprol-XL held on admission due to concern for fever, dehydration - resume at a lower dose and closely monitor  Paroxysmal A-fib-patient had a single episode of A-fib in the setting of a surgery back in 2018.  She saw cardiology as an outpatient, it was decided for her not to be on anticoagulation since he was a one-time episode.  On auscultation she is irregular, but EKG reveals sinus rhythm with PACs.  Continue to monitor  Scheduled Meds:  aspirin  325 mg Oral Daily   cefadroxil  500 mg Oral BID   Chlorhexidine Gluconate Cloth  6 each Topical Daily   enoxaparin (LOVENOX) injection  40 mg Subcutaneous Q24H   feeding supplement (K[employer]MS STANDARD 1.4)  325 mL Oral BID BM   latanoprost  1 drop Left Eye QHS   magic mouthwash  5 mL Oral QID   metoprolol succinate  50 mg Oral Daily   pravastatin  20 mg Oral q1800   sodium chloride flush  10-40 mL Intracatheter Q12H   sodium chloride flush  10-40 mL Intracatheter Q12H   timolol  1 drop Left Eye BID   Continuous Infusions:  sodium chloride 50 mL/hr at 11/24/21 1525   sodium chloride     PRN Meds:.acetaminophen **OR** acetaminophen, ondansetron **OR** ondansetron (ZOFRAN) IV, sodium chloride flush  Diet Orders (From admission, onward)     Start     Ordered  11/23/21 2019  Diet regular Room service appropriate? Yes; Fluid consistency: Thin  Diet effective now       Question Answer Comment  Room service appropriate? Yes   Fluid consistency: Thin      11/23/21 2019            DVT prophylaxis: enoxaparin (LOVENOX) injection 40 mg Start: 11/23/21 2200   Lab Results  Component Value Date    PLT 261 11/26/2021      Code Status: Full Code  Family Communication: called daughter  Status is: inpt  Home 24-48 hours--- once afebrile  Level of care: Med-Surg  Consultants:  Oncology  Objective: Vitals:   11/25/21 2158 11/26/21 0500 11/26/21 0519 11/26/21 1108  BP:   131/61 135/71  Pulse:   74 62  Resp:   16 20  Temp: (!) 101 F (38.3 C)  100.3 F (37.9 C) (!) 97.5 F (36.4 C)  TempSrc: Oral  Oral Oral  SpO2:   96% 96%  Weight:  77.4 kg    Height:        Intake/Output Summary (Last 24 hours) at 11/26/2021 1303 Last data filed at 11/26/2021 0542 Gross per 24 hour  Intake 1554 ml  Output 400 ml  Net 1154 ml   Wt Readings from Last 3 Encounters:  11/26/21 77.4 kg  11/17/21 75.5 kg  11/11/21 74.3 kg    Examination:   General: Appearance:     Overweight female in no acute distress     Lungs:     respirations unlabored  Heart:    Normal heart rate.    MS:   All extremities are intact.   Neurologic:   Awake, alert   Data Reviewed: I have independently reviewed following labs and imaging studies   CBC Recent Labs  Lab 11/23/21 1344 11/24/21 0537 11/25/21 0310 11/26/21 0450  WBC 1.9* 1.9* 2.0* 2.7*  HGB 12.4 12.6 12.6 12.0  HCT 36.7 37.6 37.5 35.5*  PLT 208 202 217 261  MCV 92.7 91.7 92.6 92.0  MCH 31.3 30.7 31.1 31.1  MCHC 33.8 33.5 33.6 33.8  RDW 15.1 15.0 15.1 15.1  LYMPHSABS 0.9  --  1.1  --   MONOABS 0.1  --  0.6  --   EOSABS 0.1  --  0.1  --   BASOSABS 0.0  --  0.1  --     Recent Labs  Lab 11/23/21 1344 11/24/21 0537 11/25/21 0310 11/26/21 0450  NA 137 136 135 135  K 3.6 3.2* 3.7 3.2*  CL 102 102 102 104  CO2 '26 26 25 24  '$ GLUCOSE 106* 101* 106* 103*  BUN 14 7* 9 8  CREATININE 0.73 0.70 0.62 0.63  CALCIUM 8.2* 8.4* 8.2* 7.8*  AST 50* 43* 41  --   ALT 45* 46* 44  --   ALKPHOS 94 101 117  --   BILITOT 0.9 0.7 0.6  --   ALBUMIN 3.0* 2.9* 2.8*  --   MG  --   --  1.5*  --      ------------------------------------------------------------------------------------------------------------------ No results for input(s): "CHOL", "HDL", "LDLCALC", "TRIG", "CHOLHDL", "LDLDIRECT" in the last 72 hours.  Lab Results  Component Value Date   HGBA1C 5.7 (H) 11/01/2016   ------------------------------------------------------------------------------------------------------------------ No results for input(s): "TSH", "T4TOTAL", "T3FREE", "THYROIDAB" in the last 72 hours.  Invalid input(s): "FREET3"  Cardiac Enzymes No results for input(s): "CKMB", "TROPONINI", "MYOGLOBIN" in the last 168 hours.  Invalid input(s): "CK" ------------------------------------------------------------------------------------------------------------------ No results found for: "BNP"  CBG: No results for input(s): "GLUCAP" in the last 168 hours.  Recent Results (from the past 240 hour(s))  Urine Culture     Status: Abnormal   Collection Time: 11/23/21  3:44 PM   Specimen: Urine, Catheterized  Result Value Ref Range Status   Specimen Description   Final    URINE, CATHETERIZED Performed at [facility] [address], [place], [place] [address]    Special Requests   Final    NONE Performed at [facility], [place] [address] [place], [place] [address]    Culture (A)  Final    >=100,000 COLONIES/mL PROTEUS MIRABILIS 60,000 COLONIES/mL ESCHERICHIA COLI    Report Status 11/26/2021 FINAL  Final   Organism ID, Bacteria PROTEUS MIRABILIS (A)  Final   Organism ID, Bacteria ESCHERICHIA COLI (A)  Final      Susceptibility   Escherichia coli - MIC*    AMPICILLIN <=2 SENSITIVE Sensitive     CEFAZOLIN <=4 SENSITIVE Sensitive     CEFEPIME <=0.12 SENSITIVE Sensitive     CEFTRIAXONE <=0.25 SENSITIVE Sensitive     CIPROFLOXACIN <=0.25 SENSITIVE Sensitive     GENTAMICIN <=1 SENSITIVE Sensitive     IMIPENEM <=0.25 SENSITIVE Sensitive     NITROFURANTOIN <=16  SENSITIVE Sensitive     TRIMETH/SULFA <=20 SENSITIVE Sensitive     AMPICILLIN/SULBACTAM <=2 SENSITIVE Sensitive     PIP/TAZO <=4 SENSITIVE Sensitive     * 60,000 COLONIES/mL ESCHERICHIA COLI   Proteus mirabilis - MIC*    AMPICILLIN <=2 SENSITIVE Sensitive     CEFAZOLIN <=4 SENSITIVE Sensitive     CEFEPIME <=0.12 SENSITIVE Sensitive     CEFTRIAXONE <=0.25 SENSITIVE Sensitive     CIPROFLOXACIN <=0.25 SENSITIVE Sensitive     GENTAMICIN <=1 SENSITIVE Sensitive     IMIPENEM 2 SENSITIVE Sensitive     NITROFURANTOIN 128 RESISTANT Resistant     TRIMETH/SULFA <=20 SENSITIVE Sensitive     AMPICILLIN/SULBACTAM <=2 SENSITIVE Sensitive     PIP/TAZO <=4 SENSITIVE Sensitive     * >=100,000 COLONIES/mL PROTEUS MIRABILIS  Blood culture (routine x 2)     Status: None (Preliminary result)   Collection Time: 11/23/21  5:20 PM   Specimen: BLOOD  Result Value Ref Range Status   Specimen Description   Final    BLOOD RIGHT ANTECUBITAL Performed at [facility], [place] [address], [place], [place] [address]    Special Requests   Final    BOTTLES DRAWN AEROBIC AND ANAEROBIC Blood Culture adequate volume Performed at [facility] [address], [place], [place] [address]    Culture   Final    NO GROWTH 3 DAYS Performed at [facility], [place] [address], [place], [place] [address]    Report Status PENDING  Incomplete  Blood culture (routine x 2)     Status: None (Preliminary result)   Collection Time: 11/23/21  5:35 PM   Specimen: BLOOD  Result Value Ref Range Status   Specimen Description   Final    BLOOD BLOOD LEFT HAND Performed at [facility] [address] [place], [place] [address]    Special Requests   Final    BOTTLES DRAWN AEROBIC AND ANAEROBIC Blood Culture results may not be optimal due to an inadequate volume of blood received in culture bottles Performed at [facility] [address], [place],  [place] [address]    Culture   Final    NO GROWTH  3 DAYS Performed at [facility], [place] [address] [place], [place] [address]    Report Status PENDING  Incomplete     Radiology Studies: No results found.   Eulogio Bear DO [facility]  Between 7 am - 7 pm I am available, please contact me via Amion (for emergencies) or Securechat (non urgent messages)  Between 7 pm - 7 am I am not available, please contact night coverage MD/APP via Amion

## 2021-11-26 NOTE — Progress Notes (Signed)
   11/26/21 1108  Vitals  Temp (!) 97.5 F (36.4 C)  Temp Source Oral  BP 135/71  MAP (mmHg) 88  BP Location Right Arm  BP Method Automatic  Patient Position (if appropriate) Lying  Pulse Rate 62  Pulse Rate Source Monitor  Resp 20  Oxygen Therapy  SpO2 96 %  O2 Device Room Air  Pain Assessment  Pain Scale 0-10  Pain Score 0   Pt transferred to chair by PT earlier this AM. Pt was trying to let her feet down from being elevated in the recliner chair when she pushed her legs down on the leg extension of the recliner chair and pt found in chair leaning forward with feet on floor. Chair alarm not present. Pt denies injury or pain at this time. VSS. Pt's daughter, Santina Evans, aware and grateful for RN notification. Jarrett Soho, charge nurse, and Eulogio Bear, DO notified. Bed alarm on. Call bell within reach.

## 2021-11-27 ENCOUNTER — Inpatient Hospital Stay (HOSPITAL_COMMUNITY): Payer: Medicare Other

## 2021-11-27 DIAGNOSIS — E876 Hypokalemia: Secondary | ICD-10-CM | POA: Diagnosis not present

## 2021-11-27 DIAGNOSIS — D701 Agranulocytosis secondary to cancer chemotherapy: Secondary | ICD-10-CM | POA: Diagnosis not present

## 2021-11-27 DIAGNOSIS — N3001 Acute cystitis with hematuria: Secondary | ICD-10-CM | POA: Diagnosis not present

## 2021-11-27 DIAGNOSIS — T451X5A Adverse effect of antineoplastic and immunosuppressive drugs, initial encounter: Secondary | ICD-10-CM | POA: Diagnosis not present

## 2021-11-27 LAB — BASIC METABOLIC PANEL
Anion gap: 8 (ref 5–15)
BUN: 10 mg/dL (ref 8–23)
CO2: 26 mmol/L (ref 22–32)
Calcium: 7.8 mg/dL — ABNORMAL LOW (ref 8.9–10.3)
Chloride: 103 mmol/L (ref 98–111)
Creatinine, Ser: 0.69 mg/dL (ref 0.44–1.00)
GFR, Estimated: >60 mL/min (ref >60)
Glucose, Bld: 138 mg/dL — ABNORMAL HIGH (ref 70–99)
Potassium: 2.9 mmol/L — ABNORMAL LOW (ref 3.5–5.1)
Sodium: 137 mmol/L (ref 135–145)

## 2021-11-27 LAB — CBC
HCT: 34 % — ABNORMAL LOW (ref 36.0–46.0)
Hemoglobin: 11.3 g/dL — ABNORMAL LOW (ref 12.0–15.0)
MCH: 30.7 pg (ref 26.0–34.0)
MCHC: 33.2 g/dL (ref 30.0–36.0)
MCV: 92.4 fL (ref 80.0–100.0)
Platelets: 335 10*3/uL (ref 150–400)
RBC: 3.68 MIL/uL — ABNORMAL LOW (ref 3.87–5.11)
RDW: 15.2 % (ref 11.5–15.5)
WBC: 5.7 10*3/uL (ref 4.0–10.5)
nRBC: 0 % (ref 0.0–0.2)

## 2021-11-27 LAB — MAGNESIUM: Magnesium: 1.6 mg/dL — ABNORMAL LOW (ref 1.7–2.4)

## 2021-11-27 MED ORDER — POTASSIUM CHLORIDE CRYS ER 20 MEQ PO TBCR
40.0000 meq | EXTENDED_RELEASE_TABLET | ORAL | Status: AC
  Administered 2021-11-27 (×3): 40 meq via ORAL
  Filled 2021-11-27 (×4): qty 2

## 2021-11-27 MED ORDER — POTASSIUM CHLORIDE CRYS ER 20 MEQ PO TBCR: 40.0000 meq | EXTENDED_RELEASE_TABLET | Freq: Once | ORAL | Status: DC

## 2021-11-27 MED ORDER — FUROSEMIDE 10 MG/ML IJ SOLN
20.0000 mg | Freq: Once | INTRAMUSCULAR | Status: AC
Start: 2021-11-27 — End: 2021-11-27
  Administered 2021-11-27: 20 mg via INTRAVENOUS
  Filled 2021-11-27: qty 2

## 2021-11-27 MED ORDER — MAGNESIUM SULFATE 2 GM/50ML IV SOLN
2.0000 g | Freq: Once | INTRAVENOUS | Status: AC
  Administered 2021-11-27: 2 g via INTRAVENOUS
  Filled 2021-11-27: qty 50

## 2021-11-27 NOTE — Progress Notes (Signed)
PROGRESS NOTE  Regina James EXB:[id number] DOB: Mar 17, 1934 DOA: 11/23/2021 PCP: Kathyrn Lass, MD   LOS: 3 days   Brief Narrative / Interim history: 88 year old female with right ovarian epithelial cancer on active chemotherapy with carboplatin and paclitaxel, history of embolic CVA, HTN, HLD who comes into the hospital with weakness, abdominal pain, nausea.  She recently started chemotherapy a week ago, she is on cycle #1/3 every 21 days.  She felt well initially but over the last 3 days she has been having a poor appetite, weakness to the point that she is having difficulties walking, and intermittent abdominal pain.  She occasionally reports difficulties with urination/incontinence due to the mass effect on the bladder.  She also had some chills at home  Pertinent data: CT head 8/21-no acute intracranial abnormalities, chronic small vessel ischemic disease CT abdomen pelvis 8/21-mass in the right paramidline anatomic pelvis, compatible with ovarian carcinoma, 5 mm lateral left abdominal peritoneal nodule that needs follow-up.  Mild right hydronephrosis likely secondary to ovarian mass.  Subjective / 24h Interval events: Had a fever overnight  Assesement and Plan: Principal Problem:   UTI (urinary tract infection) Active Problems:   Volume depletion   Malignant neoplasm of ovary (HCC)   Leukopenia due to antineoplastic chemotherapy (HCC)   Paroxysmal SVT (supraventricular tachycardia) (HCC)   History of cerebrovascular accident (CVA) due to embolism   Urinary tract infection-this is in the setting of immunosuppression due to chemotherapy, leukopenia.  Urinalysis on admission was concerning for UTI and she does have symptoms.  She was started on IV ceftriaxone-- change to PO abx -culture shows: proteus/ecoli -rehab on AM  Volume overload from IVF upon admission -IV lasix x 2  Hypokalemia -replete along with Mg  Dehydration --now volume  overload  Hypokalemia -replete  Right ovarian epithelial cancer -Follows with oncology, Dr. Alvy Bimler.  Recently started chemotherapy with carboplatin and paclitaxel on 8/15.  Imaging on admission showed mild right-sided hydronephrosis, she does not have significant symptoms with it and her kidney function is normal.  Continue to monitor clinically   History of CVA -Continue aspirin and pravastatin.   Paroxysmal SVT-Home Toprol-XL held on admission due to concern for fever, dehydration - resume at a lower dose and closely monitor  Paroxysmal A-fib-patient had a single episode of A-fib in the setting of a surgery back in 2018.  She saw cardiology as an outpatient, it was decided for her not to be on anticoagulation since he was a one-time episode.  On auscultation she is irregular, but EKG reveals sinus rhythm with PACs.  Continue to monitor  Scheduled Meds:  aspirin  325 mg Oral Daily   cefadroxil  500 mg Oral BID   Chlorhexidine Gluconate Cloth  6 each Topical Daily   enoxaparin (LOVENOX) injection  40 mg Subcutaneous Q24H   feeding supplement (KATE FARMS STANDARD 1.4)  325 mL Oral BID BM   furosemide  20 mg Intravenous Once   latanoprost  1 drop Left Eye QHS   magic mouthwash  5 mL Oral QID   metoprolol succinate  50 mg Oral Daily   potassium chloride  40 mEq Oral Q4H   pravastatin  20 mg Oral q1800   sodium chloride flush  10-40 mL Intracatheter Q12H   sodium chloride flush  10-40 mL Intracatheter Q12H   timolol  1 drop Left Eye BID   Continuous Infusions:  magnesium sulfate bolus IVPB     PRN Meds:.acetaminophen **OR** acetaminophen, haloperidol **OR** haloperidol lactate, melatonin, ondansetron **OR**  ondansetron (ZOFRAN) IV, sodium chloride flush  Diet Orders (From admission, onward)     Start     Ordered   11/23/21 2019  Diet regular Room service appropriate? Yes; Fluid consistency: Thin  Diet effective now       Question Answer Comment  Room service appropriate? Yes    Fluid consistency: Thin      11/23/21 2019            DVT prophylaxis: enoxaparin (LOVENOX) injection 40 mg Start: 11/23/21 2200   Lab Results  Component Value Date   PLT 335 11/27/2021      Code Status: Full Code  Family Communication: called daughter  Status is: inpt  SNF in AM  Level of care: Med-Surg  Consultants:  Oncology  Objective: Vitals:   11/26/21 1905 11/26/21 2155 11/27/21 0627 11/27/21 0700  BP:  128/65 139/63   Pulse:  80 65   Resp:  (!) 22 (!) 23   Temp:  98.9 F (37.2 C) 97.7 F (36.5 C)   TempSrc:  Oral Oral   SpO2: 95% 92% 92%   Weight:    75.2 kg  Height:        Intake/Output Summary (Last 24 hours) at 11/27/2021 1225 Last data filed at 11/26/2021 2149 Gross per 24 hour  Intake 1279.93 ml  Output 1700 ml  Net -420.07 ml   Wt Readings from Last 3 Encounters:  11/27/21 75.2 kg  11/17/21 75.5 kg  11/11/21 74.3 kg    Examination:    General: Appearance:     Overweight female in no acute distress     Lungs:     respirations unlabored  Heart:    Normal heart rate.   MS:   All extremities are intact.   Neurologic:   Awake, alert   Data Reviewed: I have independently reviewed following labs and imaging studies   CBC Recent Labs  Lab 11/23/21 1344 11/24/21 0537 11/25/21 0310 11/26/21 0450 11/27/21 1005  WBC 1.9* 1.9* 2.0* 2.7* 5.7  HGB 12.4 12.6 12.6 12.0 11.3*  HCT 36.7 37.6 37.5 35.5* 34.0*  PLT 208 202 217 261 335  MCV 92.7 91.7 92.6 92.0 92.4  MCH 31.3 30.7 31.1 31.1 30.7  MCHC 33.8 33.5 33.6 33.8 33.2  RDW 15.1 15.0 15.1 15.1 15.2  LYMPHSABS 0.9  --  1.1  --   --   MONOABS 0.1  --  0.6  --   --   EOSABS 0.1  --  0.1  --   --   BASOSABS 0.0  --  0.1  --   --     Recent Labs  Lab 11/23/21 1344 11/24/21 0537 11/25/21 0310 11/26/21 0450 11/27/21 1005  NA 137 136 135 135 137  K 3.6 3.2* 3.7 3.2* 2.9*  CL 102 102 102 104 103  CO2 '26 26 25 24 26  '$ GLUCOSE 106* 101* 106* 103* 138*  BUN 14 7* '9 8 10   '$ CREATININE 0.73 0.70 0.62 0.63 0.69  CALCIUM 8.2* 8.4* 8.2* 7.8* 7.8*  AST 50* 43* 41  --   --   ALT 45* 46* 44  --   --   ALKPHOS 94 101 117  --   --   BILITOT 0.9 0.7 0.6  --   --   ALBUMIN 3.0* 2.9* 2.8*  --   --   MG  --   --  1.5*  --  1.6*    ------------------------------------------------------------------------------------------------------------------ No results for input(s): "CHOL", "HDL", "LDLCALC", "  TRIG", "CHOLHDL", "LDLDIRECT" in the last 72 hours.  Lab Results  Component Value Date   HGBA1C 5.7 (H) 11/01/2016   ------------------------------------------------------------------------------------------------------------------ No results for input(s): "TSH", "T4TOTAL", "T3FREE", "THYROIDAB" in the last 72 hours.  Invalid input(s): "FREET3"  Cardiac Enzymes No results for input(s): "CKMB", "TROPONINI", "MYOGLOBIN" in the last 168 hours.  Invalid input(s): "CK" ------------------------------------------------------------------------------------------------------------------ No results found for: "BNP"  CBG: Recent Labs  Lab 11/26/21 1858  GLUCAP 107*    Recent Results (from the past 240 hour(s))  Urine Culture     Status: Abnormal   Collection Time: 11/23/21  3:44 PM   Specimen: Urine, Catheterized  Result Value Ref Range Status   Specimen Description   Final    URINE, CATHETERIZED Performed at [facility] [address] [place], [place] [address]    Special Requests   Final    NONE Performed at [facility], [place] [address], [place], [place] [address]    Culture (A)  Final    >=100,000 COLONIES/mL PROTEUS MIRABILIS 60,000 COLONIES/mL ESCHERICHIA COLI    Report Status 11/26/2021 FINAL  Final   Organism ID, Bacteria PROTEUS MIRABILIS (A)  Final   Organism ID, Bacteria ESCHERICHIA COLI (A)  Final      Susceptibility   Escherichia coli - MIC*    AMPICILLIN <=2 SENSITIVE Sensitive     CEFAZOLIN <=4 SENSITIVE  Sensitive     CEFEPIME <=0.12 SENSITIVE Sensitive     CEFTRIAXONE <=0.25 SENSITIVE Sensitive     CIPROFLOXACIN <=0.25 SENSITIVE Sensitive     GENTAMICIN <=1 SENSITIVE Sensitive     IMIPENEM <=0.25 SENSITIVE Sensitive     NITROFURANTOIN <=16 SENSITIVE Sensitive     TRIMETH/SULFA <=20 SENSITIVE Sensitive     AMPICILLIN/SULBACTAM <=2 SENSITIVE Sensitive     PIP/TAZO <=4 SENSITIVE Sensitive     * 60,000 COLONIES/mL ESCHERICHIA COLI   Proteus mirabilis - MIC*    AMPICILLIN <=2 SENSITIVE Sensitive     CEFAZOLIN <=4 SENSITIVE Sensitive     CEFEPIME <=0.12 SENSITIVE Sensitive     CEFTRIAXONE <=0.25 SENSITIVE Sensitive     CIPROFLOXACIN <=0.25 SENSITIVE Sensitive     GENTAMICIN <=1 SENSITIVE Sensitive     IMIPENEM 2 SENSITIVE Sensitive     NITROFURANTOIN 128 RESISTANT Resistant     TRIMETH/SULFA <=20 SENSITIVE Sensitive     AMPICILLIN/SULBACTAM <=2 SENSITIVE Sensitive     PIP/TAZO <=4 SENSITIVE Sensitive     * >=100,000 COLONIES/mL PROTEUS MIRABILIS  Blood culture (routine x 2)     Status: None (Preliminary result)   Collection Time: 11/23/21  5:20 PM   Specimen: BLOOD  Result Value Ref Range Status   Specimen Description   Final    BLOOD RIGHT ANTECUBITAL Performed at [facility], [place] [address], [place], [place] [address]    Special Requests   Final    BOTTLES DRAWN AEROBIC AND ANAEROBIC Blood Culture adequate volume Performed at [facility] [address], [place], [place] [address]    Culture   Final    NO GROWTH 4 DAYS Performed at [facility], [place] [address] [place], [place] [address]    Report Status PENDING  Incomplete  Blood culture (routine x 2)     Status: None (Preliminary result)   Collection Time: 11/23/21  5:35 PM   Specimen: BLOOD  Result Value Ref Range Status   Specimen Description   Final    BLOOD BLOOD LEFT HAND Performed at [facility], [place] [address], [place], [place] [address]     Special Requests   Final    BOTTLES DRAWN AEROBIC AND ANAEROBIC Blood Culture results may not be optimal due to an inadequate volume of blood received in culture bottles Performed at [place] [address] [place], [place] [address]    Culture   Final    NO GROWTH 4 DAYS Performed at [facility], [place] [address] [place], [place] [address]    Report Status PENDING  Incomplete     Radiology Studies: DG CHEST PORT 1 VIEW  Result Date: 11/27/2021 CLINICAL DATA:  Shortness of breath EXAM: PORTABLE CHEST 1 VIEW COMPARISON:  11/26/2021 FINDINGS: Right-sided chest port remains in place. Stable cardiomegaly. Mild pulmonary vascular congestion. Similar degree of interstitial opacities bilaterally. No appreciable pleural fluid collection. No pneumothorax. IMPRESSION: Findings of mild pulmonary edema, stable from prior. Electronically Signed   By: Davina Poke D.O.   On: 11/27/2021 10:09   DG Chest Port 1 View  Result Date: 11/26/2021 CLINICAL DATA:  Acute respiratory distress EXAM: PORTABLE CHEST 1 VIEW COMPARISON:  10/22/2021 FINDINGS: The lungs are symmetrically expanded. Pulmonary insufflation has decreased slightly since prior examination, however, pulmonary insufflation remains normal. There has developed mild diffuse interstitial pulmonary infiltrate, in keeping with mild interstitial pulmonary edema. No pneumothorax or pleural effusion. Right internal jugular chest port tip is seen at the superior cavoatrial junction. Cardiac size within normal limits. No acute bone abnormality. IMPRESSION: Interval development of mild interstitial pulmonary edema. Electronically Signed   By: Fidela Salisbury M.D.   On: 11/26/2021 19:42     Eulogio Bear DO [facility]  Between 7 am - 7 pm I am available, please contact me via Amion (for emergencies) or Securechat (non urgent messages)  Between 7 pm - 7 am I am not available, please contact night coverage MD/APP via  Amion

## 2021-11-27 NOTE — TOC Progression Note (Addendum)
Transition of Care [facility]) - Progression Note    Patient Details  Name: Regina James MRN: [id number] Date of Birth: Nov 21, 1933  Transition of Care [facility]) CM/SW [place], LCSW Phone Number: 11/27/2021, 9:25 AM  Clinical Narrative:    CSW reviewed bed offers for SNF placement with pt's daughter. Pt's daughter is to review bed offers with her brothers prior to making decision for placement.   Update 1215: Pt's family have accepted bed offer for [facility] SNF. [facility] is able to accept this pt with condition of private pay due to chemo treatment or pt forgoing chemo while in SNF as insurance will not cover both. Pt's family want pt to build her strength back up before continuing chemo and plan to forgo chemo tx at this time. Pt's family is to discuss this with her provider. Pt is able to transfer to [facility] on Saturday, 11/28/2021.   Expected Discharge Plan: [place] Barriers to Discharge: Continued Medical Work up  Expected Discharge Plan and Services Expected Discharge Plan: [place] In-house Referral: Clinical Social Work Discharge Planning Services: CM Consult   Living arrangements for the past 2 months: Apartment                 DME Arranged: N/A DME Agency: NA                   Social Determinants of Health (SDOH) Interventions    Readmission Risk Interventions    11/25/2021    9:36 AM  Readmission Risk Prevention Plan  Transportation Screening Complete  PCP or Specialist Appt within 5-7 Days Complete  Home Care Screening Complete  Medication Review (RN CM) Complete

## 2021-11-27 NOTE — Progress Notes (Signed)
PT Cancellation Note  Patient Details Name: Regina James MRN: [id number] DOB: 11-11-1933   Cancelled Treatment:    Reason Eval/Treat Not Completed: Other (comment) Pt with MD in room.  Check on pt after discussion finished and pt politely declined PT.  Pt reports fatigue and having a "bad" day.  Pt also states she will d/c to SNF for rehab tomorrow.   Myrtis Hopping [place] 11/27/2021, 2:56 PM Jannette Spanner PT, DPT Physical Therapist Acute Rehabilitation Services Preferred contact method: Secure Chat Weekend Pager Only: [phone] Office: [phone]

## 2021-11-27 NOTE — Progress Notes (Addendum)
Regina James   DOB:1933/06/11   WE#:[id number]    I saw the patient and reviewed plan of care with the patient and her daughter  ASSESSMENT & PLAN:  Right ovarian epithelial cancer ([place]) Currently receiving neoadjuvant chemotherapy with carboplatin and Taxol She had multiple complications including infection, slight worsening slurring of speech and dehydration Overall, she is improved Continue supportive care I plan future dose reduction for cycle 2 I reviewed prognosis with the patient and family with and without treatment   Urinary tract infection Urine culture with greater than 100,000 Proteus and 60,000 colonies E. coli Has been transitioned to oral antibiotics   Leukopenia secondary to recent chemotherapy Given fever and signs of UTI but clinically, she does not have any evidence of septic shock Hold off G-CSF for now CBC from this morning is now normal  Cerebrovascular accident (CVA) due to embolism of left middle cerebral artery ([place]) She has history of stroke and mild residual permanent right-sided weakness She is able to cope well with her mild disability She has excellent family support I recommend resumption of aspirin therapy   Tobacco abuse Unfortunately, she continues to smoke We discussed importance of nicotine cessation   Discharge planning Possibly DC to rehab soon She has appointment scheduled after Labor Day to see me I will sign off  Regina Bussing, NP 11/27/2021 9:05 AM Regina Lark, MD  Subjective:  She tells me that she feels better this morning Eating breakfast the time my visit No fevers documented Has been up to the recliner  Objective:  Vitals:   11/26/21 2155 11/27/21 0627  BP: 128/65 139/63  Pulse: 80 65  Resp: (!) 22 (!) 23  Temp: 98.9 F (37.2 C) 97.7 F (36.5 C)  SpO2: 92% 92%     Intake/Output Summary (Last 24 hours) at 11/27/2021 0905 Last data filed at 11/26/2021 2149 Gross per 24 hour  Intake 1279.93 ml  Output 1700 ml   Net -420.07 ml    GENERAL:alert, no distress and comfortable NEURO: alert & oriented x 3 with fluent speech, no focal motor/sensory deficits   Labs:  Recent Labs    11/23/21 1344 11/24/21 0537 11/25/21 0310 11/26/21 0450  NA 137 136 135 135  K 3.6 3.2* 3.7 3.2*  CL 102 102 102 104  CO2 '26 26 25 24  '$ GLUCOSE 106* 101* 106* 103*  BUN 14 7* 9 8  CREATININE 0.73 0.70 0.62 0.63  CALCIUM 8.2* 8.4* 8.2* 7.8*  GFRNONAA >60 >60 >60 >60  PROT 6.1* 6.1* 6.1*  --   ALBUMIN 3.0* 2.9* 2.8*  --   AST 50* 43* 41  --   ALT 45* 46* 44  --   ALKPHOS 94 101 117  --   BILITOT 0.9 0.7 0.6  --     Studies:  DG Chest Port 1 View  Result Date: 11/26/2021 CLINICAL DATA:  Acute respiratory distress EXAM: PORTABLE CHEST 1 VIEW COMPARISON:  10/22/2021 FINDINGS: The lungs are symmetrically expanded. Pulmonary insufflation has decreased slightly since prior examination, however, pulmonary insufflation remains normal. There has developed mild diffuse interstitial pulmonary infiltrate, in keeping with mild interstitial pulmonary edema. No pneumothorax or pleural effusion. Right internal jugular chest port tip is seen at the superior cavoatrial junction. Cardiac size within normal limits. No acute bone abnormality. IMPRESSION: Interval development of mild interstitial pulmonary edema. Electronically Signed   By: Fidela Salisbury M.D.   On: 11/26/2021 19:42   CT ABDOMEN PELVIS W CONTRAST  Result Date: 11/23/2021 CLINICAL  DATA:  Abdominal pain.  * Tracking Code: BO * EXAM: CT ABDOMEN AND PELVIS WITH CONTRAST TECHNIQUE: Multidetector CT imaging of the abdomen and pelvis was performed using the standard protocol following bolus administration of intravenous contrast. RADIATION DOSE REDUCTION: This exam was performed according to the departmental dose-optimization program which includes automated exposure control, adjustment of the mA and/or kV according to patient size and/or use of iterative reconstruction technique.  CONTRAST:  149m OMNIPAQUE IOHEXOL 300 MG/ML  SOLN COMPARISON:  10/22/2021 and MR pelvis 10/03/2021. FINDINGS: Lower chest: Trace right pleural effusion with adjacent compressive atelectasis in the right lower lobe. Subsegmental volume loss in the left lower lobe. Atherosclerotic calcification of the aorta and coronary arteries. Catheter tip in the high right atrium. Heart is enlarged. No pericardial effusion. Distal esophagus is grossly unremarkable. Hepatobiliary: Liver is decreased in attenuation diffusely. Biliary ductal dilatation, likely related to cholecystectomy. Pancreas: Negative. Spleen: Negative. Adrenals/Urinary Tract: Adrenal glands are unremarkable. Horseshoe kidney. 3.3 cm fluid density lesion in the right kidney, compatible with a cyst. No follow-up necessary. 1.6 cm low-attenuation lesion in the midline renal parenchyma, too small to characterize. No specific follow-up necessary. Mild right hydronephrosis, new from 10/22/2021, likely due to external compression by a large cystic and solid mass in the anatomic pelvis. Left ureter is decompressed. Bladder is low in volume. Stomach/Bowel: Stomach, small bowel and colon are unremarkable. Appendix is not readily visualized. Vascular/Lymphatic: Atherosclerotic calcification of the aorta. Retroaortic left renal vein. No pathologically enlarged lymph nodes. Small bowel mesenteric lymph nodes are not enlarged by CT size criteria. Reproductive: Large cystic and solid mass in the right paramidline anatomic pelvis, measuring 9.6 x 15.6 cm (2/67), as on 10/22/2021. Mass compresses the uterus inferiorly. Other: Trace pelvic free fluid. 5 mm peritoneal nodule adjacent to the proximal descending colon (2/29), unchanged and indeterminate. Mesenteries and peritoneum are otherwise unremarkable. Musculoskeletal: Degenerative changes in the spine and hips. Minimal grade 1 anterolisthesis of L4 on L5. IMPRESSION: 1. Large cystic and solid mass in the right paramidline  anatomic pelvis, compatible with ovarian carcinoma. 2. 5 mm lateral left abdominal peritoneal nodule, indeterminate. Recommend attention on follow-up. 3. Mild right hydronephrosis, likely secondary to compression by the aforementioned ovarian mass. 4. Trace right pleural effusion. 5. Hepatic steatosis. 6. Aortic atherosclerosis (ICD10-I70.0). Coronary artery calcification. Electronically Signed   By: MLorin PicketM.D.   On: 11/23/2021 15:38   CT Head Wo Contrast  Result Date: 11/23/2021 CLINICAL DATA:  Neuro deficit, acute, stroke suspected. EXAM: CT HEAD WITHOUT CONTRAST TECHNIQUE: Contiguous axial images were obtained from the base of the skull through the vertex without intravenous contrast. RADIATION DOSE REDUCTION: This exam was performed according to the departmental dose-optimization program which includes automated exposure control, adjustment of the mA and/or kV according to patient size and/or use of iterative reconstruction technique. COMPARISON:  Head CT 06/12/2020 and MRI 11/04/2016 FINDINGS: Brain: There is no evidence of an acute infarct, intracranial hemorrhage, mass, midline shift, or extra-axial fluid collection. Small chronic cortical infarcts are again seen in the left frontal and left occipital lobes. Patchy hypodensities in the cerebral white matter bilaterally are unchanged and nonspecific but compatible with moderate chronic small vessel ischemic disease. There is mild cerebral atrophy. Vascular: Calcified atherosclerosis at the skull base. No hyperdense vessel. Skull: No fracture or suspicious osseous lesion. Sinuses/Orbits: Paranasal sinuses and mastoid air cells are clear. Bilateral cataract extraction. Other: None. IMPRESSION: 1. No evidence of acute intracranial abnormality. 2. Moderate chronic small vessel ischemic disease. Electronically Signed  By: Logan Bores M.D.   On: 11/23/2021 15:29   IR IMAGING GUIDED PORT INSERTION  Result Date: 11/12/2021 INDICATION: 88 year old  female with history of right-sided ovarian cancer. She presents for port catheter placement for chemotherapy. EXAM: IMPLANTED PORT A CATH PLACEMENT WITH ULTRASOUND AND FLUOROSCOPIC GUIDANCE MEDICATIONS: None. ANESTHESIA/SEDATION: Versed 1.5 mg IV; Fentanyl 50 mcg IV; Moderate Sedation Time:  17 minutes The patient's vital signs and level of consciousness were continuously monitored during the procedure by the interventional radiology nurse under my direct supervision. FLUOROSCOPY: Radiation exposure index: 1 mGy reference air kerma COMPLICATIONS: None immediate. PROCEDURE: The right neck and chest was prepped with chlorhexidine, and draped in the usual sterile fashion using maximum barrier technique (cap and mask, sterile gown, sterile gloves, large sterile sheet, hand hygiene and cutaneous antiseptic). Local anesthesia was attained by infiltration with 1% lidocaine with epinephrine. Ultrasound demonstrated patency of the right internal jugular vein, and this was documented with an image. Under real-time ultrasound guidance, this vein was accessed with a 21 gauge micropuncture needle and image documentation was performed. A small dermatotomy was made at the access site with an 11 scalpel. A 0.018" wire was advanced into the SVC and the access needle exchanged for a 71F micropuncture vascular sheath. The 0.018" wire was then removed and a 0.035" wire advanced into the IVC. An appropriate location for the subcutaneous reservoir was selected below the clavicle and an incision was made through the skin and underlying soft tissues. The subcutaneous tissues were then dissected using a combination of blunt and sharp surgical technique and a pocket was formed. A single lumen power injectable portacatheter was then tunneled through the subcutaneous tissues from the pocket to the dermatotomy and the port reservoir placed within the subcutaneous pocket. The venous access site was then serially dilated and a peel away vascular  sheath placed over the wire. The wire was removed and the port catheter advanced into position under fluoroscopic guidance. The catheter tip is positioned in the superior cavoatrial junction. This was documented with a spot image. The portacatheter was then tested and found to flush and aspirate well. The port was flushed with saline followed by 100 units/mL heparinized saline. The pocket was then closed in two layers using first subdermal inverted interrupted absorbable sutures followed by a running subcuticular suture. The epidermis was then sealed with Dermabond. The dermatotomy at the venous access site was also closed with Dermabond. IMPRESSION: Successful placement of a right IJ approach Power Port with ultrasound and fluoroscopic guidance. The catheter is ready for use. Electronically Signed   By: Jacqulynn Cadet M.D.   On: 11/12/2021 16:24

## 2021-11-28 DIAGNOSIS — M6281 Muscle weakness (generalized): Secondary | ICD-10-CM | POA: Diagnosis not present

## 2021-11-28 DIAGNOSIS — R1900 Intra-abdominal and pelvic swelling, mass and lump, unspecified site: Secondary | ICD-10-CM | POA: Diagnosis not present

## 2021-11-28 DIAGNOSIS — R2689 Other abnormalities of gait and mobility: Secondary | ICD-10-CM | POA: Diagnosis not present

## 2021-11-28 DIAGNOSIS — N3001 Acute cystitis with hematuria: Secondary | ICD-10-CM | POA: Diagnosis not present

## 2021-11-28 DIAGNOSIS — E782 Mixed hyperlipidemia: Secondary | ICD-10-CM | POA: Diagnosis not present

## 2021-11-28 DIAGNOSIS — I10 Essential (primary) hypertension: Secondary | ICD-10-CM | POA: Diagnosis not present

## 2021-11-28 DIAGNOSIS — D701 Agranulocytosis secondary to cancer chemotherapy: Secondary | ICD-10-CM | POA: Diagnosis not present

## 2021-11-28 DIAGNOSIS — Z515 Encounter for palliative care: Secondary | ICD-10-CM | POA: Diagnosis not present

## 2021-11-28 DIAGNOSIS — K5902 Outlet dysfunction constipation: Secondary | ICD-10-CM | POA: Diagnosis not present

## 2021-11-28 DIAGNOSIS — Z7401 Bed confinement status: Secondary | ICD-10-CM | POA: Diagnosis not present

## 2021-11-28 DIAGNOSIS — F5104 Psychophysiologic insomnia: Secondary | ICD-10-CM | POA: Diagnosis not present

## 2021-11-28 DIAGNOSIS — E569 Vitamin deficiency, unspecified: Secondary | ICD-10-CM | POA: Diagnosis not present

## 2021-11-28 DIAGNOSIS — I471 Supraventricular tachycardia: Secondary | ICD-10-CM | POA: Diagnosis not present

## 2021-11-28 DIAGNOSIS — T451X5A Adverse effect of antineoplastic and immunosuppressive drugs, initial encounter: Secondary | ICD-10-CM | POA: Diagnosis not present

## 2021-11-28 DIAGNOSIS — E869 Volume depletion, unspecified: Secondary | ICD-10-CM | POA: Diagnosis not present

## 2021-11-28 DIAGNOSIS — Z8673 Personal history of transient ischemic attack (TIA), and cerebral infarction without residual deficits: Secondary | ICD-10-CM | POA: Diagnosis not present

## 2021-11-28 DIAGNOSIS — C569 Malignant neoplasm of unspecified ovary: Secondary | ICD-10-CM | POA: Diagnosis not present

## 2021-11-28 DIAGNOSIS — R4182 Altered mental status, unspecified: Secondary | ICD-10-CM | POA: Diagnosis not present

## 2021-11-28 DIAGNOSIS — D62 Acute posthemorrhagic anemia: Secondary | ICD-10-CM | POA: Diagnosis not present

## 2021-11-28 DIAGNOSIS — H2513 Age-related nuclear cataract, bilateral: Secondary | ICD-10-CM | POA: Diagnosis not present

## 2021-11-28 DIAGNOSIS — G4700 Insomnia, unspecified: Secondary | ICD-10-CM | POA: Diagnosis not present

## 2021-11-28 DIAGNOSIS — Z720 Tobacco use: Secondary | ICD-10-CM | POA: Diagnosis not present

## 2021-11-28 DIAGNOSIS — I6522 Occlusion and stenosis of left carotid artery: Secondary | ICD-10-CM | POA: Diagnosis not present

## 2021-11-28 DIAGNOSIS — N390 Urinary tract infection, site not specified: Secondary | ICD-10-CM | POA: Diagnosis not present

## 2021-11-28 LAB — CULTURE, BLOOD (ROUTINE X 2)
Culture: NO GROWTH
Culture: NO GROWTH
Special Requests: ADEQUATE

## 2021-11-28 LAB — BASIC METABOLIC PANEL
Anion gap: 7 (ref 5–15)
BUN: 12 mg/dL (ref 8–23)
CO2: 26 mmol/L (ref 22–32)
Calcium: 8.1 mg/dL — ABNORMAL LOW (ref 8.9–10.3)
Chloride: 105 mmol/L (ref 98–111)
Creatinine, Ser: 0.63 mg/dL (ref 0.44–1.00)
GFR, Estimated: >60 mL/min (ref >60)
Glucose, Bld: 108 mg/dL — ABNORMAL HIGH (ref 70–99)
Potassium: 3.2 mmol/L — ABNORMAL LOW (ref 3.5–5.1)
Sodium: 138 mmol/L (ref 135–145)

## 2021-11-28 LAB — MAGNESIUM: Magnesium: 1.9 mg/dL (ref 1.7–2.4)

## 2021-11-28 MED ORDER — HEPARIN SOD (PORK) LOCK FLUSH 100 UNIT/ML IV SOLN
500.0000 [IU] | INTRAVENOUS | Status: AC | PRN
  Administered 2021-11-28: 500 [IU]
  Filled 2021-11-28: qty 5

## 2021-11-28 MED ORDER — MAGNESIUM OXIDE -MG SUPPLEMENT 400 (240 MG) MG PO TABS: 400.0000 mg | ORAL_TABLET | Freq: Every day | ORAL | 0 refills | Status: DC

## 2021-11-28 MED ORDER — POTASSIUM CHLORIDE CRYS ER 20 MEQ PO TBCR
40.0000 meq | EXTENDED_RELEASE_TABLET | Freq: Once | ORAL | Status: AC
  Administered 2021-11-28: 40 meq via ORAL
  Filled 2021-11-28: qty 2

## 2021-11-28 MED ORDER — MAGIC MOUTHWASH: 5.0000 mL | Freq: Three times a day (TID) | ORAL | 0 refills | Status: DC | PRN

## 2021-11-28 MED ORDER — KATE FARMS STANDARD 1.4 PO LIQD: 325.0000 mL | Freq: Two times a day (BID) | ORAL | Status: DC

## 2021-11-28 MED ORDER — MELATONIN 3 MG PO TABS: 3.0000 mg | ORAL_TABLET | Freq: Every evening | ORAL | 0 refills | Status: DC | PRN

## 2021-11-28 MED ORDER — MAGNESIUM SULFATE IN D5W 1-5 GM/100ML-% IV SOLN
1.0000 g | Freq: Once | INTRAVENOUS | Status: AC
Start: 2021-11-28 — End: 2021-11-28
  Administered 2021-11-28: 1 g via INTRAVENOUS
  Filled 2021-11-28: qty 100

## 2021-11-28 MED ORDER — CEFADROXIL 500 MG PO CAPS: 500.0000 mg | ORAL_CAPSULE | Freq: Two times a day (BID) | ORAL | Status: DC

## 2021-11-28 MED ORDER — METOPROLOL SUCCINATE ER 100 MG PO TB24: 50.0000 mg | ORAL_TABLET | Freq: Every day | ORAL | Status: DC

## 2021-11-28 NOTE — Social Work (Signed)
CSW called [facility] and spoke with [place], the number she gave me for report is [phone]. CSW reported it to the nurse in charge of Pt. TOC signing off.

## 2021-11-28 NOTE — Discharge Summary (Signed)
Physician Discharge Summary  Regina James:[id number] DOB: 04-30-1933 DOA: 11/23/2021  PCP: Kathyrn Lass, MD  Admit date: 11/23/2021 Discharge date: 11/28/2021  Admitted From: home Discharge disposition: SNF   Recommendations for Outpatient Follow-Up:   BMP, MG  week Abx through 8/28 Follow up with Dr. Alvy Bimler   Discharge Diagnosis:   Principal Problem:   UTI (urinary tract infection) Active Problems:   Volume depletion   Malignant neoplasm of ovary (HCC)   Leukopenia due to antineoplastic chemotherapy ([place])   Paroxysmal SVT (supraventricular tachycardia) ([place])   History of cerebrovascular accident (CVA) due to embolism    Discharge Condition: Improved.  Diet recommendation:  Regular.  Wound care: None.  Code status: Full.   History of Present Illness:   Regina James is a 88 y.o. female with medical history significant for right ovarian epithelial cancer on active chemotherapy with carboplatin and paclitaxel, history of embolic CVA, HTN, HLD who presented to the ED for evaluation of abdominal pain with nausea.   Patient recently started chemotherapy with carboplatin and paclitaxel on 11/17/2028.  She says she did well the first day, however each subsequent day she has been feeling worse and worse.  She has had low appetite and has not eaten much in the last 3 days.  She has noted lightheadedness/dizziness, worse with positional change.  She has intermittent abdominal pain related to her ovarian mass.  She also reports occasional difficulty with urination/incontinence due to mass effect on her bladder.    Found to have UTI.  Hospitalization complicated with volume overload.  Now going to SNF.     Hospital Course by Problem:   Urinary tract infection-this is in the setting of immunosuppression due to chemotherapy, leukopenia.  Urinalysis on admission was concerning for UTI and she does have symptoms.  She was started on IV ceftriaxone-- change to PO  abx -culture shows: proteus/ecoli- abx through 8/28 -rehab    Volume overload from IVF upon admission -IV lasix x 2 -improved- no SOB   Hypokalemia -replete along with Mg   Dehydration --now volume overload-- see above-- now euvolemic   Right ovarian epithelial cancer -Follows with oncology, Dr. Alvy Bimler.  Recently started chemotherapy with carboplatin and paclitaxel on 8/15.  Imaging on admission showed mild right-sided hydronephrosis, she does not have significant symptoms with it and her kidney function is normal.  Continue to monitor clinically outpatient   History of CVA -Continue aspirin and pravastatin.   Paroxysmal SVT-Home Toprol-XL held on admission due to concern for fever, dehydration - resume at a lower dose and closely monitor   Paroxysmal A-fib-patient had a single episode of A-fib in the setting of a surgery back in 2018.  She saw cardiology as an outpatient, it was decided for her not to be on anticoagulation since he was a one-time episode.  On auscultation she is irregular, but EKG reveals sinus rhythm with PACs.  Continue to monitor    Medical Consultants:   oncology   Discharge Exam:   Vitals:   11/27/21 1958 11/28/21 0514  BP: (!) 143/80 (!) 160/89  Pulse: 72 69  Resp: 16 16  Temp: 98.3 F (36.8 C) 99.1 F (37.3 C)  SpO2: 95% 93%   Vitals:   11/27/21 1315 11/27/21 1958 11/28/21 0500 11/28/21 0514  BP: (!) 144/72 (!) 143/80  (!) 160/89  Pulse: 63 72  69  Resp: '20 16  16  '$ Temp: (!) 97.5 F (36.4 C) 98.3 F (36.8 C)  99.1 F (37.3 C)  TempSrc: Oral Oral  Oral  SpO2: 97% 95%  93%  Weight:   79.5 kg   Height:        General exam: Appears calm and comfortable.  Lung diminished but no wheezing  The results of significant diagnostics from this hospitalization (including imaging, microbiology, ancillary and laboratory) are listed below for reference.     Procedures and Diagnostic Studies:   CT ABDOMEN PELVIS W CONTRAST  Result Date:  11/23/2021 CLINICAL DATA:  Abdominal pain.  * Tracking Code: BO * EXAM: CT ABDOMEN AND PELVIS WITH CONTRAST TECHNIQUE: Multidetector CT imaging of the abdomen and pelvis was performed using the standard protocol following bolus administration of intravenous contrast. RADIATION DOSE REDUCTION: This exam was performed according to the departmental dose-optimization program which includes automated exposure control, adjustment of the mA and/or kV according to patient size and/or use of iterative reconstruction technique. CONTRAST:  164m OMNIPAQUE IOHEXOL 300 MG/ML  SOLN COMPARISON:  10/22/2021 and MR pelvis 10/03/2021. FINDINGS: Lower chest: Trace right pleural effusion with adjacent compressive atelectasis in the right lower lobe. Subsegmental volume loss in the left lower lobe. Atherosclerotic calcification of the aorta and coronary arteries. Catheter tip in the high right atrium. Heart is enlarged. No pericardial effusion. Distal esophagus is grossly unremarkable. Hepatobiliary: Liver is decreased in attenuation diffusely. Biliary ductal dilatation, likely related to cholecystectomy. Pancreas: Negative. Spleen: Negative. Adrenals/Urinary Tract: Adrenal glands are unremarkable. Horseshoe kidney. 3.3 cm fluid density lesion in the right kidney, compatible with a cyst. No follow-up necessary. 1.6 cm low-attenuation lesion in the midline renal parenchyma, too small to characterize. No specific follow-up necessary. Mild right hydronephrosis, new from 10/22/2021, likely due to external compression by a large cystic and solid mass in the anatomic pelvis. Left ureter is decompressed. Bladder is low in volume. Stomach/Bowel: Stomach, small bowel and colon are unremarkable. Appendix is not readily visualized. Vascular/Lymphatic: Atherosclerotic calcification of the aorta. Retroaortic left renal vein. No pathologically enlarged lymph nodes. Small bowel mesenteric lymph nodes are not enlarged by CT size criteria. Reproductive:  Large cystic and solid mass in the right paramidline anatomic pelvis, measuring 9.6 x 15.6 cm (2/67), as on 10/22/2021. Mass compresses the uterus inferiorly. Other: Trace pelvic free fluid. 5 mm peritoneal nodule adjacent to the proximal descending colon (2/29), unchanged and indeterminate. Mesenteries and peritoneum are otherwise unremarkable. Musculoskeletal: Degenerative changes in the spine and hips. Minimal grade 1 anterolisthesis of L4 on L5. IMPRESSION: 1. Large cystic and solid mass in the right paramidline anatomic pelvis, compatible with ovarian carcinoma. 2. 5 mm lateral left abdominal peritoneal nodule, indeterminate. Recommend attention on follow-up. 3. Mild right hydronephrosis, likely secondary to compression by the aforementioned ovarian mass. 4. Trace right pleural effusion. 5. Hepatic steatosis. 6. Aortic atherosclerosis (ICD10-I70.0). Coronary artery calcification. Electronically Signed   By: MLorin PicketM.D.   On: 11/23/2021 15:38   CT Head Wo Contrast  Result Date: 11/23/2021 CLINICAL DATA:  Neuro deficit, acute, stroke suspected. EXAM: CT HEAD WITHOUT CONTRAST TECHNIQUE: Contiguous axial images were obtained from the base of the skull through the vertex without intravenous contrast. RADIATION DOSE REDUCTION: This exam was performed according to the departmental dose-optimization program which includes automated exposure control, adjustment of the mA and/or kV according to patient size and/or use of iterative reconstruction technique. COMPARISON:  Head CT 06/12/2020 and MRI 11/04/2016 FINDINGS: Brain: There is no evidence of an acute infarct, intracranial hemorrhage, mass, midline shift, or extra-axial fluid collection. Small chronic cortical infarcts are  again seen in the left frontal and left occipital lobes. Patchy hypodensities in the cerebral white matter bilaterally are unchanged and nonspecific but compatible with moderate chronic small vessel ischemic disease. There is mild  cerebral atrophy. Vascular: Calcified atherosclerosis at the skull base. No hyperdense vessel. Skull: No fracture or suspicious osseous lesion. Sinuses/Orbits: Paranasal sinuses and mastoid air cells are clear. Bilateral cataract extraction. Other: None. IMPRESSION: 1. No evidence of acute intracranial abnormality. 2. Moderate chronic small vessel ischemic disease. Electronically Signed   By: Logan Bores M.D.   On: 11/23/2021 15:29     Labs:   Basic Metabolic Panel: Recent Labs  Lab 11/24/21 0537 11/25/21 0310 11/26/21 0450 11/27/21 1005 11/28/21 0321  NA 136 135 135 137 138  K 3.2* 3.7 3.2* 2.9* 3.2*  CL 102 102 104 103 105  CO2 '26 25 24 26 26  '$ GLUCOSE 101* 106* 103* 138* 108*  BUN 7* '9 8 10 12  '$ CREATININE 0.70 0.62 0.63 0.69 0.63  CALCIUM 8.4* 8.2* 7.8* 7.8* 8.1*  MG  --  1.5*  --  1.6* 1.9   GFR Estimated Creatinine Clearance: 53.7 mL/min (by C-G formula based on SCr of 0.63 mg/dL). Liver Function Tests: Recent Labs  Lab 11/23/21 1344 11/24/21 0537 11/25/21 0310  AST 50* 43* 41  ALT 45* 46* 44  ALKPHOS 94 101 117  BILITOT 0.9 0.7 0.6  PROT 6.1* 6.1* 6.1*  ALBUMIN 3.0* 2.9* 2.8*   No results for input(s): "LIPASE", "AMYLASE" in the last 168 hours. No results for input(s): "AMMONIA" in the last 168 hours. Coagulation profile No results for input(s): "INR", "PROTIME" in the last 168 hours.  CBC: Recent Labs  Lab 11/23/21 1344 11/24/21 0537 11/25/21 0310 11/26/21 0450 11/27/21 1005  WBC 1.9* 1.9* 2.0* 2.7* 5.7  NEUTROABS 0.7*  --  0.2*  --   --   HGB 12.4 12.6 12.6 12.0 11.3*  HCT 36.7 37.6 37.5 35.5* 34.0*  MCV 92.7 91.7 92.6 92.0 92.4  PLT 208 202 217 261 335   Cardiac Enzymes: No results for input(s): "CKTOTAL", "CKMB", "CKMBINDEX", "TROPONINI" in the last 168 hours. BNP: Invalid input(s): "POCBNP" CBG: Recent Labs  Lab 11/26/21 1858  GLUCAP 107*   D-Dimer No results for input(s): "DDIMER" in the last 72 hours. Hgb A1c No results for  input(s): "HGBA1C" in the last 72 hours. Lipid Profile No results for input(s): "CHOL", "HDL", "LDLCALC", "TRIG", "CHOLHDL", "LDLDIRECT" in the last 72 hours. Thyroid function studies No results for input(s): "TSH", "T4TOTAL", "T3FREE", "THYROIDAB" in the last 72 hours.  Invalid input(s): "FREET3" Anemia work up No results for input(s): "VITAMINB12", "FOLATE", "FERRITIN", "TIBC", "IRON", "RETICCTPCT" in the last 72 hours. Microbiology Recent Results (from the past 240 hour(s))  Urine Culture     Status: Abnormal   Collection Time: 11/23/21  3:44 PM   Specimen: Urine, Catheterized  Result Value Ref Range Status   Specimen Description   Final    URINE, CATHETERIZED Performed at [facility] [address] [place], [place] [address]    Special Requests   Final    NONE Performed at [facility], [place] [address], [place], [place] [address]    Culture (A)  Final    >=100,000 COLONIES/mL PROTEUS MIRABILIS 60,000 COLONIES/mL ESCHERICHIA COLI    Report Status 11/26/2021 FINAL  Final   Organism ID, Bacteria PROTEUS MIRABILIS (A)  Final   Organism ID, Bacteria ESCHERICHIA COLI (A)  Final      Susceptibility  Escherichia coli - MIC*    AMPICILLIN <=2 SENSITIVE Sensitive     CEFAZOLIN <=4 SENSITIVE Sensitive     CEFEPIME <=0.12 SENSITIVE Sensitive     CEFTRIAXONE <=0.25 SENSITIVE Sensitive     CIPROFLOXACIN <=0.25 SENSITIVE Sensitive     GENTAMICIN <=1 SENSITIVE Sensitive     IMIPENEM <=0.25 SENSITIVE Sensitive     NITROFURANTOIN <=16 SENSITIVE Sensitive     TRIMETH/SULFA <=20 SENSITIVE Sensitive     AMPICILLIN/SULBACTAM <=2 SENSITIVE Sensitive     PIP/TAZO <=4 SENSITIVE Sensitive     * 60,000 COLONIES/mL ESCHERICHIA COLI   Proteus mirabilis - MIC*    AMPICILLIN <=2 SENSITIVE Sensitive     CEFAZOLIN <=4 SENSITIVE Sensitive     CEFEPIME <=0.12 SENSITIVE Sensitive     CEFTRIAXONE <=0.25 SENSITIVE Sensitive     CIPROFLOXACIN <=0.25 SENSITIVE  Sensitive     GENTAMICIN <=1 SENSITIVE Sensitive     IMIPENEM 2 SENSITIVE Sensitive     NITROFURANTOIN 128 RESISTANT Resistant     TRIMETH/SULFA <=20 SENSITIVE Sensitive     AMPICILLIN/SULBACTAM <=2 SENSITIVE Sensitive     PIP/TAZO <=4 SENSITIVE Sensitive     * >=100,000 COLONIES/mL PROTEUS MIRABILIS  Blood culture (routine x 2)     Status: None   Collection Time: 11/23/21  5:20 PM   Specimen: BLOOD  Result Value Ref Range Status   Specimen Description   Final    BLOOD RIGHT ANTECUBITAL Performed at [facility] [address], [place], [place] [address]    Special Requests   Final    BOTTLES DRAWN AEROBIC AND ANAEROBIC Blood Culture adequate volume Performed at [facility] [address], [place], [place] [address]    Culture   Final    NO GROWTH 5 DAYS Performed at [facility], [place] [address], [place], [place] [address]    Report Status 11/28/2021 FINAL  Final  Blood culture (routine x 2)     Status: None   Collection Time: 11/23/21  5:35 PM   Specimen: BLOOD  Result Value Ref Range Status   Specimen Description   Final    BLOOD BLOOD LEFT HAND Performed at [facility] [address] [place], [place] [address]    Special Requests   Final    BOTTLES DRAWN AEROBIC AND ANAEROBIC Blood Culture results may not be optimal due to an inadequate volume of blood received in culture bottles Performed at [facility] [address] [place], [place] [address]    Culture   Final    NO GROWTH 5 DAYS Performed at [facility], [place] [address] [place],  [address]    Report Status 11/28/2021 FINAL  Final     Discharge Instructions:   Discharge Instructions     Diet general   Complete by: As directed    Increase activity slowly   Complete by: As directed       Allergies as of 11/28/2021       Reactions   Losartan Anaphylaxis   Per daughter and Patient, states that provider  suggested she is having allergic reaction to medication   Dorzolamide Hcl-timolol Mal Itching   Made vision very blurry and itching.Eyes very red.   Morphine And Related Swelling   Levofloxacin Anxiety, Other (See Comments)   Double vision        Medication List     STOP taking these medications    oxyCODONE 5 MG immediate release tablet  Commonly known as: Oxy IR/ROXICODONE   senna-docusate 8.6-50 MG tablet Commonly known as: Senokot-S       TAKE these medications    aspirin 325 MG tablet Take 1 tablet (325 mg total) by mouth daily.   cefadroxil 500 MG capsule Commonly known as: DURICEF Take 1 capsule (500 mg total) by mouth 2 (two) times daily.   cholecalciferol 25 MCG (1000 UNIT) tablet Commonly known as: VITAMIN D3 Take 1,000 Units by mouth daily.   dexamethasone 4 MG tablet Commonly known as: DECADRON Take 2 tabs at the night before and 2 tab the morning of chemotherapy, every 3 weeks, by mouth x 6 cycles   feeding supplement (KATE FARMS STANDARD 1.4) Liqd liquid Take 325 mLs by mouth 2 (two) times daily between meals.   latanoprost 0.005 % ophthalmic solution Commonly known as: XALATAN Place 1 drop into the left eye at bedtime.   magic mouthwash Soln Take 5 mLs by mouth 3 (three) times daily as needed for mouth pain.   magnesium oxide 400 (240 Mg) MG tablet Commonly known as: MAG-OX Take 1 tablet (400 mg total) by mouth at bedtime.   melatonin 3 MG Tabs tablet Take 1 tablet (3 mg total) by mouth at bedtime as needed.   metoprolol succinate 100 MG 24 hr tablet Commonly known as: TOPROL-XL Take 0.5 tablets (50 mg total) by mouth daily.   pravastatin 20 MG tablet Commonly known as: PRAVACHOL TAKE 1 TABLET AT 6PM. What changed: See the new instructions.   Sleep Aid 25 MG tablet Generic drug: doxylamine (Sleep) Take 50 mg by mouth at bedtime.   Systane 0.4-0.3 % Soln Generic drug: Polyethyl Glycol-Propyl Glycol Place 1 drop into both eyes daily  as needed for dry eyes.   timolol 0.5 % ophthalmic solution Commonly known as: TIMOPTIC Place 1 drop into the left eye 2 (two) times daily.          Time coordinating discharge: 45 min  Signed:  Geradine Girt DO  [facility] 11/28/2021, 9:13 AM

## 2021-11-28 NOTE — Progress Notes (Signed)
Attempted to call report to [facility] facility x2, call was directed to a full mailbox both times

## 2021-11-28 NOTE — Plan of Care (Signed)
  Problem: Clinical Measurements: Goal: Will remain free from infection Outcome: Progressing   Problem: Clinical Measurements: Goal: Diagnostic test results will improve Outcome: Progressing   Problem: Activity: Goal: Risk for activity intolerance will decrease Outcome: Progressing   Problem: Nutrition: Goal: Adequate nutrition will be maintained Outcome: Progressing   

## 2021-12-01 ENCOUNTER — Telehealth: Payer: Self-pay

## 2021-12-01 ENCOUNTER — Telehealth: Payer: Self-pay | Admitting: Gynecologic Oncology

## 2021-12-01 NOTE — Telephone Encounter (Signed)
Received a call from, Ms. Bracknell son, Corene Cornea. He states the family would like to get a second opinion on the care for his mom. He states after the first round on chemo, Ms.Advincula was transported to the ER, she was so wiped out from treatment, The family thought they had lost her. He states, "mom hasn't even decided if she wants to get the next 2 chemo treatments" She has been placed in a skilled nursing facility [facility]). The family has hospice in place as well as [facility] in hopes of getting Ms. Weyandt back to her town home.  Per jason,the family is at a loss of what to do next and asking themselves if they are doing the right things for their mom. He wanted a second opinion with Dr. Threasa Heads, I informed him that MD as retired.  Corene Cornea wants Dr.Tucker to call him personally to discuss options. CB# [phone]

## 2021-12-01 NOTE — Telephone Encounter (Signed)
I spoke with her son over the phone for 49 minutes I reviewed her hospital stay, the plan of care, treatment options, the role of second opinion and what we can do to help the patient At the end of the conversation, he was grateful for the time taken to explain everything to him I will call [facility] Thursday or Friday to get an update If she is not ready to be discharged, we will cancel her treatment next week and reschedule when ready

## 2021-12-01 NOTE — Telephone Encounter (Signed)
Returned the patient's son's call after finished in surgery.  He had already had a long conversation with Dr. Alvy Bimler, was very appreciative for her call.  He voiced appreciation of my call.  We discussed how his mom is doing and steps that they are putting in place to get home health and hospice set up for when she is ready to be discharged.  All questions answered.  Jeral Pinch MD Gynecologic Oncology

## 2021-12-02 ENCOUNTER — Encounter: Payer: Self-pay | Admitting: Hematology and Oncology

## 2021-12-02 ENCOUNTER — Non-Acute Institutional Stay: Payer: Medicare Other | Admitting: Internal Medicine

## 2021-12-02 DIAGNOSIS — I10 Essential (primary) hypertension: Secondary | ICD-10-CM | POA: Diagnosis not present

## 2021-12-02 DIAGNOSIS — N3001 Acute cystitis with hematuria: Secondary | ICD-10-CM | POA: Diagnosis not present

## 2021-12-02 DIAGNOSIS — T451X5A Adverse effect of antineoplastic and immunosuppressive drugs, initial encounter: Secondary | ICD-10-CM

## 2021-12-02 DIAGNOSIS — Z515 Encounter for palliative care: Secondary | ICD-10-CM

## 2021-12-02 DIAGNOSIS — C569 Malignant neoplasm of unspecified ovary: Secondary | ICD-10-CM | POA: Diagnosis not present

## 2021-12-02 DIAGNOSIS — D701 Agranulocytosis secondary to cancer chemotherapy: Secondary | ICD-10-CM

## 2021-12-02 DIAGNOSIS — E869 Volume depletion, unspecified: Secondary | ICD-10-CM

## 2021-12-02 DIAGNOSIS — K5902 Outlet dysfunction constipation: Secondary | ICD-10-CM

## 2021-12-02 DIAGNOSIS — N390 Urinary tract infection, site not specified: Secondary | ICD-10-CM | POA: Diagnosis not present

## 2021-12-02 DIAGNOSIS — E782 Mixed hyperlipidemia: Secondary | ICD-10-CM | POA: Diagnosis not present

## 2021-12-02 DIAGNOSIS — F5104 Psychophysiologic insomnia: Secondary | ICD-10-CM | POA: Diagnosis not present

## 2021-12-02 NOTE — Progress Notes (Addendum)
[facility] Palliative Care Consult Note Telephone: [phone]  Fax: [phone]   Date of encounter: 12/02/21 12:06 PM PATIENT NAME: Regina James [address] [place] [id number]   [phone] (home)  DOB: 01-09-1934 MRN: [id number] PRIMARY CARE PROVIDER:    Kathyrn Lass, MD,  [place] [address] [phone]  REFERRING PROVIDER:   Kathyrn James, [place],  [place] [address] [phone]  RESPONSIBLE PARTY:    Contact Information     Name Relation Home Work Mobile   Regina James,Regina James Daughter [phone]     Regina James   [phone]        I met face to face with patient and family in [facility]. Palliative Care was asked to follow this patient by consultation request of  Regina Lass, MD to address advance care planning and complex medical decision making. This is the initial visit.                                     ASSESSMENT AND PLAN / RECOMMENDATIONS:   Advance Care Planning/Goals of Care: Goals include to maximize quality of life and symptom management. Patient/health care surrogate gave his/her permission to discuss.Our advance care planning conversation included a discussion about:    The value and importance of advance care planning  Experiences with loved ones who have been seriously ill or have died  Exploration of personal, cultural or spiritual beliefs that might influence medical decisions  Exploration of goals of care in the event of a sudden injury or illness  Identification  of a healthcare agent  Review and updating or creation of an  advance directive document . Decision not to resuscitate or to de-escalate disease focused treatments due to poor prognosis. CODE STATUS:  previously full code during hospitalization; met with pt and family and pt elects DNR--form completed.  Initiated MOST discussion but they are not ready to decide about  hospitalizations yet in view of indecision about further chemo.  They are waiting on second opinion from oncology to determine if she should go through a weaker chemo in a few weeks  Symptom Management/Plan: 1. Malignant neoplasm of ovary, unspecified laterality ([place]) -had one round of chemo which left her weak, nauseous, having diarrhea, and then complicated by leukopenia and UTI which caused delirium--she is hesitant to pursue more -we discussed option of NOT taking these treatments and focusing on comfort with symptom mgt -she is thinking about this and also awaiting the oncology second opinion -her family is supportive of whatever she decides but seem to be leaning toward comfort  2. Acute cystitis with hematuria -completing abx and much improved  3. Leukopenia due to antineoplastic chemotherapy (HCC) -will require careful monitoring and dose reductions in view of her advanced age and prior responses if she does repeat treatment  4. Volume depletion -resolved, encouraged hydration today   5. Psychophysiological insomnia -doing better with sleep aid added at [facility]  6. Constipation due to outlet dysfunction -discussed importance of at least senokot to keep her stools soft in view of opioids (not currently needing fortunately) then addition of miralax if still has challenges with bms--want to prevent obstructions  7. Palliative care by specialist -discussed role of palliative vs hospice -pt loves to have fun and might prefer to focus on time with family and enjoying life rather than more treatments especially if  benefits of the treatments are minimal for her  -will continue to follow at home, as well either way -pt thinking things over and will keep [place] in the loop--card provided to pt, daughter and home instead team that will be doing home care for her -supervisor updated at [facility]    Follow up Palliative Care Visit: Palliative care will continue to follow for complex  medical decision making, advance care planning, and clarification of goals. Return 2-4 wks   This visit was coded based on medical decision making (MDM). 86 mins spent on ACP.  PPS: 50%  HOSPICE ELIGIBILITY/DIAGNOSIS: yes if discontinues chemo/surgery  Chief Complaint: initial palliative consult at [facility]:  Regina James is a 88 y.o. year old female  with right ovarian epithelial cancer on active chemo with carboplatin and paclitaxel, h/o embolic CVA, carotid artery disease s/p L CEA in 2018, paroxysmal afib, paroxysmal SVT, nonrheumatic MR, aortic aneurysm w/o rupture, atrial septal aneurysm, HTN, and hyperlipidemia seen for initial palliative consult.   Early July of this year, she developed LLQ abdominal pain and CT and MRI revealed possible left ovarian neoplasm. She prsented to the ED 7/20 due to progression of this pain including lightheadedness.  She was hypotensive and had not been eating or drinking well.  She was treated with fentanyl 86mg and zofran along with 2L of NS in the ED.  Labs were remarkable only for wbc of 12.  CT abd/pelvis revealed  slight enlargement of mass with new small volume ascites.  She was advised to follow-up with gyn-onc.  She was sent home with percocet and zofran.  At the gyn appt on 7/24, tumor markers were drawn and plans made for diagnostic laparoscopy.  If she had considerable metatastic disease plan was for biopsy alone and chemo.  If little or no intra-abdominal disease, mass excision would be done.  At that visit, it was also noted that she was having increased urinary incontinence to large volumes over the prior two weeks, she also had decreased appetite, pain, early satiety, bloating and 10 lb weight loss.  8/1, she underwent diagnostic laparoscopy with biopsy--she was noted to have a large mass filling the cul-de-sac that was nodular and minimally mobile, small volume green-tinged ascites, several less than 560mimplants on  the left liver edge, multiple 64m20mo 1cm areas of carcinomatosis on the small bowel mesentery, omentum, and multiple loops of bowel, also on cecum and an adhesion b/w cecum and anterior abdominal wall.  Also atrophic left ovary.  Carcinomatosis along uterine serosa.  Anterior cul-de-sac and peritoneal adhesion b/w anterior abdominal wall and bladder peritoneum with carcinomatosis.  Biopsies were consistent with adenocarcinoma.  Tumor board was held with conclusion that likely ovarian primary so neoadjuvant chemo recommended and if good response, interval debulking over 3 cycles.  Dr. GorAlvy Bimlercommended carboplatin and taxol IV every 3 wks for 6 cycles.  8/14 she had labs and 8/15, she had her first chemo.  She had loose stools and was achy all over, having difficulty eating.  She was admitted 8/21 to 26 with UTI, volume depletion, leukopenia from chemo.  She had hypokalemia and hypomagnesemia that were repleted.  Mild right hydronephrosis was also noted.  [facility].  Abx with cefadroxil completed 8/28.  A second opinion was requested by pt's son, JasCorene Corneae: treatment options for her.    Currently, there is a plan for home hospice and additional assistance at home from home  instead senior care.    I am seeing her today to further discuss palliative care and hospice and help arrange for this care to continue at home.    History obtained from review of EMR, discussion with primary team, and interview with family, facility staff/caregiver and/or Regina James.   I reviewed available labs, medications, imaging, studies and related documents from the EMR.  Records reviewed and summarized above.   ROS  Review of Systems  Constitutional:  Positive for activity change, appetite change, fatigue and unexpected weight change. Negative for chills and fever.  HENT:  Negative for congestion and trouble swallowing.   Eyes:  Negative for visual disturbance.  Respiratory:  Negative for  chest tightness and shortness of breath.   Cardiovascular:  Negative for chest pain, palpitations and leg swelling.  Gastrointestinal:  Negative for abdominal pain, constipation, diarrhea, nausea and vomiting.       Not currently  Genitourinary:  Negative for dysuria.  Musculoskeletal:  Negative for gait problem.  Skin:  Positive for pallor.       alopecia  Neurological:  Positive for weakness. Negative for dizziness.  Hematological:  Bruises/bleeds easily.  Psychiatric/Behavioral:  Positive for sleep disturbance. The patient is nervous/anxious.        No current confusion    Physical Exam: There were no vitals filed for this visit. There is no height or weight on file to calculate BMI. Wt Readings from Last 500 Encounters:  11/28/21 175 lb 4.3 oz (79.5 kg)  11/17/21 166 lb 8 oz (75.5 kg)  11/11/21 163 lb 12.8 oz (74.3 kg)  11/03/21 164 lb 3.2 oz (74.5 kg)  11/02/21 164 lb 3.2 oz (74.5 kg)  10/27/21 162 lb (73.5 kg)  10/26/21 163 lb (73.9 kg)  05/19/20 170 lb (77.1 kg)  03/10/20 167 lb 3.2 oz (75.8 kg)  12/07/19 169 lb 12.8 oz (77 kg)  06/08/18 171 lb 6.4 oz (77.7 kg)  02/16/18 167 lb (75.8 kg)  06/07/17 163 lb 3.2 oz (74 kg)  06/02/17 164 lb (74.4 kg)  05/23/17 167 lb 6.4 oz (75.9 kg)  01/03/17 160 lb 12.8 oz (72.9 kg)  11/18/16 160 lb (72.6 kg)  11/10/16 158 lb (71.7 kg)  08/26/16 162 lb (73.5 kg)  08/10/16 161 lb (73 kg)  06/27/16 155 lb (70.3 kg)  05/19/16 164 lb (74.4 kg)  05/28/15 160 lb 1.9 oz (72.6 kg)  01/08/15 157 lb 1.9 oz (71.3 kg)  05/23/14 161 lb (73 kg)  02/25/14 154 lb (69.9 kg)  02/16/13 160 lb (72.6 kg)  02/14/12 158 lb (71.7 kg)  02/17/11 156 lb (70.8 kg)   Physical Exam Constitutional:      General: She is not in acute distress.    Appearance: She is not ill-appearing or toxic-appearing.  HENT:     Head: Normocephalic and atraumatic.     Right Ear: External ear normal.     Left Ear: External ear normal.     Nose: Nose normal.  Eyes:      Conjunctiva/sclera: Conjunctivae normal.     Pupils: Pupils are equal, round, and reactive to light.  Cardiovascular:     Rate and Rhythm: Normal rate and regular rhythm.     Pulses: Normal pulses.     Heart sounds: Normal heart sounds.  Pulmonary:     Effort: Pulmonary effort is normal.  Abdominal:     General: Bowel sounds are normal.     Tenderness: There is no abdominal tenderness.  Musculoskeletal:  General: Normal range of motion.     Right lower leg: No edema.     Left lower leg: No edema.     Comments: Ambulatory w/o assistive device  Skin:    General: Skin is warm and dry.     Coloration: Skin is pale.     Comments: Alopecia--thinning hair with clumps coming out on shirt  Neurological:     General: No focal deficit present.     Mental Status: She is alert and oriented to person, place, and time.  Psychiatric:        Mood and Affect: Mood normal.     CURRENT PROBLEM LIST:  Patient Active Problem List   Diagnosis Date Noted   UTI (urinary tract infection) 11/23/2021   Leukopenia due to antineoplastic chemotherapy ([place]) 11/23/2021   Volume depletion 11/23/2021   Malignant neoplasm of ovary ([place]) 11/09/2021   Pelvic mass in female    Other ascites    Carotid stenosis, left 01/03/2017   History of CEA (carotid endarterectomy) 01/03/2017   Thoracic aortic aneurysm without rupture ([place]) 01/03/2017   Subclavian artery stenosis, left ([place]) 01/03/2017   Right sided weakness    History of basal cell carcinoma    PSVT (paroxysmal supraventricular tachycardia) ([place])    History of CVA with residual deficit    Medically noncompliant    Acute blood loss anemia    Dysphagia, post-stroke    Leukocytosis    Tachypnea    Mixed dyslipidemia 11/01/2016   Essential hypertension 11/01/2016   Tachyarrhythmia 11/01/2016   Internal carotid artery stenosis, left 11/01/2016   Aortic arch aneurysm ([place]) 11/01/2016   History of cerebrovascular accident (CVA) due to embolism  10/31/2016   Right arm weakness 06/27/2016   Right arm numbness 06/27/2016   Deviated septum 06/27/2016   Disorder of ethmoidal sinus 06/27/2016   Acute ischemic stroke [facility])    Dysarthria    Paroxysmal SVT (supraventricular tachycardia) ([place]) 05/21/2016   Angiopathy, peripheral ([place]) 01/07/2015   Cloudy posterior capsule 06/01/2013   Pseudoaphakia 05/02/2013   Malaise and fatigue 02/16/2013   Cataract, nuclear 01/28/2012   Palpitations 02/17/2011   Claudication ([place]) 02/17/2011   Tobacco abuse 02/17/2011   PAST MEDICAL HISTORY:  Active Ambulatory Problems    Diagnosis Date Noted   Palpitations 02/17/2011   Claudication ([place]) 02/17/2011   Tobacco abuse 02/17/2011   Malaise and fatigue 02/16/2013   Cataract, nuclear 01/28/2012   Cloudy posterior capsule 06/01/2013   Angiopathy, peripheral ([place]) 01/07/2015   Pseudoaphakia 05/02/2013   Paroxysmal SVT (supraventricular tachycardia) ([employer]) 05/21/2016   Right arm weakness 06/27/2016   Right arm numbness 06/27/2016   Deviated septum 06/27/2016   Disorder of ethmoidal sinus 06/27/2016   Acute ischemic stroke [facility])    Dysarthria    History of cerebrovascular accident (CVA) due to embolism 10/31/2016   Mixed dyslipidemia 11/01/2016   Essential hypertension 11/01/2016   Tachyarrhythmia 11/01/2016   Internal carotid artery stenosis, left 11/01/2016   Aortic arch aneurysm ([place]) 11/01/2016   Right sided weakness    History of basal cell carcinoma    PSVT (paroxysmal supraventricular tachycardia) ([place])    History of CVA with residual deficit    Medically noncompliant    Acute blood loss anemia    Dysphagia, post-stroke    Leukocytosis    Tachypnea    Carotid stenosis, left 01/03/2017   History of CEA (carotid endarterectomy) 01/03/2017   Thoracic aortic aneurysm without rupture ([place]) 01/03/2017   Subclavian artery stenosis, left ([place])  01/03/2017   Pelvic mass in female    Other ascites    Malignant neoplasm of ovary ([place])  11/09/2021   UTI (urinary tract infection) 11/23/2021   Leukopenia due to antineoplastic chemotherapy ([place]) 11/23/2021   Volume depletion 11/23/2021   Resolved Ambulatory Problems    Diagnosis Date Noted   No Resolved Ambulatory Problems   Past Medical History:  Diagnosis Date   Atrial septal aneurysm    Cancer [facility])    Carotid artery disease ([place])    History of PSVT (paroxysmal supraventricular tachycardia)    Hyperlipidemia    Hypertension    Mitral regurgitation    Penetrating ulcer of aorta (HCC)    Right ovarian epithelial cancer (HCC)    Stroke ([place])    Thoracic aortic aneurysm (HCC)    SOCIAL HX:  Social History   Tobacco Use   Smoking status: Former    Packs/day: 0.25    Years: 45.00    Total pack years: 11.25    Types: Cigarettes    Quit date: 06/10/2016    Years since quitting: 5.4   Smokeless tobacco: Never   Tobacco comments:    Occasionally tobacco use - 1-2 cig a week  Substance Use Topics   Alcohol use: Yes    Alcohol/week: 7.0 standard drinks of alcohol    Types: 7 Glasses of wine per week    Comment: glass of wine 3-4 days per wek   FAMILY HX:  Family History  Problem Relation Age of Onset   Varicose Veins Mother    Cardiomyopathy Mother    Cancer Father        prostate   Diabetes Father    Kidney cancer Brother    Arrhythmia Brother    Colon cancer Neg Hx    Breast cancer Neg Hx    Ovarian cancer Neg Hx    Endometrial cancer Neg Hx    Pancreatic cancer Neg Hx       ALLERGIES:  Allergies  Allergen Reactions   Losartan Anaphylaxis    Per daughter and Patient, states that provider suggested she is having allergic reaction to medication   Dorzolamide Hcl-Timolol Mal Itching    Made vision very blurry and itching.Eyes very red.   Morphine And Related Swelling   Levofloxacin Anxiety and Other (See Comments)    Double vision      PERTINENT MEDICATIONS:  Outpatient Encounter Medications as of 12/02/2021  Medication Sig   aspirin 325 MG  tablet Take 1 tablet (325 mg total) by mouth daily.   cefadroxil (DURICEF) 500 MG capsule Take 1 capsule (500 mg total) by mouth 2 (two) times daily.   cholecalciferol (VITAMIN D3) 25 MCG (1000 UNIT) tablet Take 1,000 Units by mouth daily.   dexamethasone (DECADRON) 4 MG tablet Take 2 tabs at the night before and 2 tab the morning of chemotherapy, every 3 weeks, by mouth x 6 cycles   doxylamine, Sleep, (SLEEP AID) 25 MG tablet Take 50 mg by mouth at bedtime.   latanoprost (XALATAN) 0.005 % ophthalmic solution Place 1 drop into the left eye at bedtime.   magic mouthwash SOLN Take 5 mLs by mouth 3 (three) times daily as needed for mouth pain.   magnesium oxide (MAG-OX) 400 (240 Mg) MG tablet Take 1 tablet (400 mg total) by mouth at bedtime.   melatonin 3 MG TABS tablet Take 1 tablet (3 mg total) by mouth at bedtime as needed.   metoprolol succinate (TOPROL-XL) 100 MG 24 hr tablet  Take 0.5 tablets (50 mg total) by mouth daily.   Nutritional Supplements (FEEDING SUPPLEMENT, KATE FARMS STANDARD 1.4,) LIQD liquid Take 325 mLs by mouth 2 (two) times daily between meals.   Polyethyl Glycol-Propyl Glycol (SYSTANE) 0.4-0.3 % SOLN Place 1 drop into both eyes daily as needed for dry eyes.   pravastatin (PRAVACHOL) 20 MG tablet TAKE 1 TABLET AT 6PM. (Patient taking differently: Take 20 mg by mouth daily.)   timolol (TIMOPTIC) 0.5 % ophthalmic solution Place 1 drop into the left eye 2 (two) times daily.   [DISCONTINUED] prochlorperazine (COMPAZINE) 10 MG tablet Take 1 tablet (10 mg total) by mouth every 6 (six) hours as needed (Nausea or vomiting).   No facility-administered encounter medications on file as of 12/02/2021.    Thank you for the opportunity to participate in the care of Ms. Bordonaro.  The palliative care team will continue to follow. Please call our office at [phone] if we can be of additional assistance.   Hollace Kinnier, DO  COVID-19 PATIENT SCREENING TOOL Asked and negative response unless  otherwise noted:  Have you had symptoms of covid, tested positive or been in contact with someone with symptoms/positive test in the past 5-10 days?  NO

## 2021-12-03 ENCOUNTER — Telehealth: Payer: Self-pay

## 2021-12-03 NOTE — Telephone Encounter (Signed)
I was told coming here would void her benefits at [facility] That was based on 49 mins of discussion with her son I prefer to wait until she leaves that facility

## 2021-12-03 NOTE — Telephone Encounter (Signed)
-----   Message from [name], MD sent at 12/03/2021  7:42 AM EDT ----- Can you call her or [name] and inquire if they know how long she will be at [facility]?

## 2021-12-03 NOTE — Telephone Encounter (Signed)
Called and given below message to [name]. She verbalized understanding. Appts canceled for next week. [name] will call the office back to reschedule MD appt once [facility].

## 2021-12-03 NOTE — Telephone Encounter (Signed)
Called Regina James with below message. Liane be at [facility] about 2 more weeks while get help set up at home. Lash[place] does not think she wants anymore chemo but wants to talk with Dr. Alvy Bimler regarding stopping treatment. She can keep appt as scheduled on 9/5 to discuss.

## 2021-12-08 ENCOUNTER — Inpatient Hospital Stay: Payer: Medicare Other

## 2021-12-08 ENCOUNTER — Inpatient Hospital Stay: Payer: Medicare Other | Admitting: Hematology and Oncology

## 2021-12-11 DIAGNOSIS — M6281 Muscle weakness (generalized): Secondary | ICD-10-CM | POA: Diagnosis not present

## 2021-12-11 DIAGNOSIS — R2689 Other abnormalities of gait and mobility: Secondary | ICD-10-CM | POA: Diagnosis not present

## 2021-12-14 ENCOUNTER — Telehealth: Payer: Self-pay

## 2021-12-14 NOTE — Telephone Encounter (Signed)
Returned call to [name]. She left a message that her Mom is going home tomorrow 9/12. She is requesting appt for her Mom. Left a message asking her to call the office back if needed and that this message will be forwarded to Dr. Alvy Bimler.

## 2021-12-15 ENCOUNTER — Encounter: Payer: Self-pay | Admitting: Hematology and Oncology

## 2021-12-15 NOTE — Telephone Encounter (Signed)
Sent LOS

## 2021-12-16 ENCOUNTER — Telehealth: Payer: Self-pay

## 2021-12-16 NOTE — Telephone Encounter (Signed)
Received call from pt's daughter asking for an appt with MD for f/u. Advised pt's daughter per MD last encounter that scheduling will be in touch to schedule a visit. She verbalized thanks and understanding.

## 2021-12-17 ENCOUNTER — Inpatient Hospital Stay: Payer: Medicare Other | Attending: Gynecologic Oncology | Admitting: Hematology and Oncology

## 2021-12-17 ENCOUNTER — Encounter: Payer: Self-pay | Admitting: Hematology and Oncology

## 2021-12-17 VITALS — BP 131/78 | HR 68 | Temp 98.0°F | Resp 18 | Ht 66.0 in | Wt 157.8 lb

## 2021-12-17 DIAGNOSIS — Z7982 Long term (current) use of aspirin: Secondary | ICD-10-CM | POA: Insufficient documentation

## 2021-12-17 DIAGNOSIS — F17210 Nicotine dependence, cigarettes, uncomplicated: Secondary | ICD-10-CM | POA: Diagnosis not present

## 2021-12-17 DIAGNOSIS — C569 Malignant neoplasm of unspecified ovary: Secondary | ICD-10-CM | POA: Diagnosis not present

## 2021-12-17 DIAGNOSIS — Z7952 Long term (current) use of systemic steroids: Secondary | ICD-10-CM | POA: Insufficient documentation

## 2021-12-17 DIAGNOSIS — Z7189 Other specified counseling: Secondary | ICD-10-CM | POA: Diagnosis not present

## 2021-12-17 DIAGNOSIS — Z8051 Family history of malignant neoplasm of kidney: Secondary | ICD-10-CM | POA: Diagnosis not present

## 2021-12-17 DIAGNOSIS — C561 Malignant neoplasm of right ovary: Secondary | ICD-10-CM | POA: Insufficient documentation

## 2021-12-17 DIAGNOSIS — Z86718 Personal history of other venous thrombosis and embolism: Secondary | ICD-10-CM | POA: Diagnosis not present

## 2021-12-17 DIAGNOSIS — I6930 Unspecified sequelae of cerebral infarction: Secondary | ICD-10-CM | POA: Diagnosis not present

## 2021-12-17 NOTE — Assessment & Plan Note (Signed)
We have extensive discussions about goals of care She is enjoying good quality of life right now and back to baseline performance status We discussed prognosis with or without treatment and overall life expectancy She will think about it and discuss goals of care with her family and will get back to me hopefully by the end of the month

## 2021-12-17 NOTE — Progress Notes (Signed)
[place] OFFICE PROGRESS NOTE  Patient Care Team: Kathyrn Lass, MD as PCP - General (Family Medicine) Debara Pickett Nadean Corwin, MD as PCP - Cardiology (Cardiology) Debara Pickett Nadean Corwin, MD as Consulting Physician (Cardiology) [name], MD as Consulting Physician (Hematology and Oncology) Nilda Simmer, NP as Nurse Practitioner Gayland Curry, DO (Geriatric Medicine)  ASSESSMENT & PLAN:  Malignant neoplasm of ovary ([place]) Cycle 1 of chemotherapy was complicated by hospitalization, urinary tract infection, strokelike symptoms, generalized weakness and delirium She is now back to her usual self and is enjoying good quality of life CT imaging performed while she was hospitalized show positive response to treatment even with just 1 dose of chemotherapy We have extensive discussions about goals of care It is not clear that the patient wants to proceed with further treatment even with dose reduction or single agent treatment She will think about it and let me know her final decision If she is firm on her decision not to proceed with further chemotherapy, I will get her port removed and refer her for home-based palliative care  Goals of care, counseling/discussion We have extensive discussions about goals of care She is enjoying good quality of life right now and back to baseline performance status We discussed prognosis with or without treatment and overall life expectancy She will think about it and discuss goals of care with her family and will get back to me hopefully by the end of the month  No orders of the defined types were placed in this encounter.   All questions were answered. The patient knows to call the clinic with any problems, questions or concerns. The total time spent in the appointment was 40 minutes encounter with patients including review of chart and various tests results, discussions about plan of care and coordination of care plan   [name],  MD 12/17/2021 4:06 PM  INTERVAL HISTORY: Please see below for problem oriented charting. she returns for treatment follow-up with her daughter She is back to her home after recent discharge from skilled nursing facility She is enjoying good quality of life She is able to walk up the stairs in her house without difficulties No new neurological deficits or slurring of speech She denies abdominal pain, bloating or changes in bowel habits Her appetite is good We spent majority of the time today discussing about risk, benefits, side effects of continuing treatment with dose reduction, reducing chemotherapy with single agent carboplatin only or discontinuing of treatment  REVIEW OF SYSTEMS:   Constitutional: Denies fevers, chills or abnormal weight loss Eyes: Denies blurriness of vision Ears, nose, mouth, throat, and face: Denies mucositis or sore throat Respiratory: Denies cough, dyspnea or wheezes Cardiovascular: Denies palpitation, chest discomfort or lower extremity swelling Gastrointestinal:  Denies nausea, heartburn or change in bowel habits Skin: Denies abnormal skin rashes Lymphatics: Denies new lymphadenopathy or easy bruising Neurological:Denies numbness, tingling or new weaknesses Behavioral/Psych: Mood is stable, no new changes  All other systems were reviewed with the patient and are negative.  I have reviewed the past medical history, past surgical history, social history and family history with the patient and they are unchanged from previous note.  ALLERGIES:  is allergic to losartan, dorzolamide hcl-timolol mal, morphine and related, and levofloxacin.  MEDICATIONS:  Current Outpatient Medications  Medication Sig Dispense Refill   aspirin 325 MG tablet Take 1 tablet (325 mg total) by mouth daily.     cholecalciferol (VITAMIN D3) 25 MCG (1000 UNIT) tablet Take 1,000 Units by  mouth daily.     doxylamine, Sleep, (SLEEP AID) 25 MG tablet Take 50 mg by mouth at bedtime.      latanoprost (XALATAN) 0.005 % ophthalmic solution Place 1 drop into the left eye at bedtime.     magic mouthwash SOLN Take 5 mLs by mouth 3 (three) times daily as needed for mouth pain.  0   metoprolol succinate (TOPROL-XL) 100 MG 24 hr tablet Take 0.5 tablets (50 mg total) by mouth daily.     Polyethyl Glycol-Propyl Glycol (SYSTANE) 0.4-0.3 % SOLN Place 1 drop into both eyes daily as needed for dry eyes. prn     pravastatin (PRAVACHOL) 20 MG tablet TAKE 1 TABLET AT 6PM. (Patient taking differently: Take 20 mg by mouth daily. Per daughter 1/2 tablet daily) 30 tablet 1   timolol (TIMOPTIC) 0.5 % ophthalmic solution Place 1 drop into the left eye 2 (two) times daily.     No current facility-administered medications for this visit.    SUMMARY OF ONCOLOGIC HISTORY: Oncology History  Malignant neoplasm of ovary ([place])  09/30/2021 Initial Diagnosis   Patient was seen by her primary care provider at the end of June.  She endorsed decreased appetite as well as abdominal pain for approximately 2 weeks.   10/01/2021 Imaging   CT abdomen and pelvis 1. Large complex pelvic mass suspicious for malignancy arising from the right ovary. Further characterization with MRI and gynecology/surgical consult is advised. 2. No bowel obstruction. 3. Fatty liver. 4. Horseshoe kidney. 5. Aortic Atherosclerosis (ICD10-I70.0).   10/22/2021 Imaging   CT abdomen and pelvis 1. Slightly increased size of the mixed cystic and solid mass presumably arising from the right ovary concerning for primary ovarian malignancy. 2. New small volume abdominopelvic ascites. 3. Horseshoe kidney 4.  Aortic Atherosclerosis (ICD10-I70.0).   10/27/2021 Tumor Marker   Patient's tumor was tested for the following markers: CA-125. Results of the tumor marker test revealed 180.   10/30/2021 Initial Diagnosis   Patient was seen by her primary care provider at the end of June.  She endorsed decreased appetite as well as abdominal pain for  approximately 2 weeks   11/03/2021 Pathology Results   FINAL MICROSCOPIC DIAGNOSIS:   A.   CUL DE SAC, ANTERIOR BIOPSY:  -    Adenocarcinoma, present on frozen section slides.   B.   CUL DE SAC, ANTERIOR, #2, BIOPSY:  -    Adenocarcinoma, strongly CDX-2 immunoreactive, most suggestive of gastrointestinal primary.   COMMENT:   The immunophenotype favors a gastrointestinal primary.   The tumor (Block B1) was interrogated with immunohistochemical (IHC) stains (CK7, CK20, CDX-2, PAX8, ER, PR, p53, p16, vimentin).   The tumor is strongly CDX-2 positive and has focal p16 reactivity.  The tumor is negative for CK7, CK20, PAX8, ER, PR and vimentin.  The p53 is wildtype.  The IHC controls are satisfactory.    11/03/2021 Imaging   MRI pelvis 1. Large mixed solid and cystic mass of the right ovary measuring at least 13.6 x 7.4 x 9.9 cm, highly concerning for primary ovarian malignancy. 2. Mass closely abuts and possibly directly involves the posterior uterine fundus. 3. Normal left ovary. 4. No evidence of pelvic lymphadenopathy. 5. Partially imaged horseshoe kidney.     11/09/2021 Initial Diagnosis   Right ovarian epithelial cancer ([place])   11/09/2021 Cancer Staging   Staging form: Ovary, Fallopian Tube, and Primary Peritoneal Carcinoma, AJCC 8th Edition - Clinical stage from 11/09/2021: FIGO Stage III (cT3, cN0, cM0) - Signed by  [name], MD on 11/09/2021 Stage prefix: Initial diagnosis   11/17/2021 - 11/17/2021 Chemotherapy   Patient is on Treatment Plan : OVARIAN Carboplatin (AUC 6) / Paclitaxel (175) q21d x 6 cycles     11/17/2021 -  Chemotherapy   Patient is on Treatment Plan : OVARIAN Carboplatin (AUC 6) + Paclitaxel (175) q21d X 6 Cycles     11/20/2021 Tumor Marker   Patient's tumor was tested for the following markers: CA-125. Results of the tumor marker test revealed 89.3.     PHYSICAL EXAMINATION: ECOG PERFORMANCE STATUS: 1 - Symptomatic but completely ambulatory  Vitals:    12/17/21 1342  BP: 131/78  Pulse: 68  Resp: 18  Temp: 98 F (36.7 C)  SpO2: 100%   Filed Weights   12/17/21 1342  Weight: 157 lb 12.8 oz (71.6 kg)    GENERAL:alert, no distress and comfortable NEURO: alert & oriented x 3 with fluent speech, no focal motor/sensory deficits  LABORATORY DATA:  I have reviewed the data as listed    Component Value Date/Time   NA 138 11/28/2021 0321   K 3.2 (L) 11/28/2021 0321   CL 105 11/28/2021 0321   CO2 26 11/28/2021 0321   GLUCOSE 108 (H) 11/28/2021 0321   BUN 12 11/28/2021 0321   CREATININE 0.63 11/28/2021 0321   CREATININE 0.64 11/16/2021 1137   CALCIUM 8.1 (L) 11/28/2021 0321   PROT 6.1 (L) 11/25/2021 0310   ALBUMIN 2.8 (L) 11/25/2021 0310   AST 41 11/25/2021 0310   AST 16 11/16/2021 1137   ALT 44 11/25/2021 0310   ALT 10 11/16/2021 1137   ALKPHOS 117 11/25/2021 0310   BILITOT 0.6 11/25/2021 0310   BILITOT 0.6 11/16/2021 1137   GFRNONAA >60 11/28/2021 0321   GFRNONAA >60 11/16/2021 1137   GFRAA >60 11/06/2016 0307    No results found for: "SPEP", "UPEP"  Lab Results  Component Value Date   WBC 5.7 11/27/2021   NEUTROABS 0.2 (LL) 11/25/2021   HGB 11.3 (L) 11/27/2021   HCT 34.0 (L) 11/27/2021   MCV 92.4 11/27/2021   PLT 335 11/27/2021      Chemistry      Component Value Date/Time   NA 138 11/28/2021 0321   K 3.2 (L) 11/28/2021 0321   CL 105 11/28/2021 0321   CO2 26 11/28/2021 0321   BUN 12 11/28/2021 0321   CREATININE 0.63 11/28/2021 0321   CREATININE 0.64 11/16/2021 1137      Component Value Date/Time   CALCIUM 8.1 (L) 11/28/2021 0321   ALKPHOS 117 11/25/2021 0310   AST 41 11/25/2021 0310   AST 16 11/16/2021 1137   ALT 44 11/25/2021 0310   ALT 10 11/16/2021 1137   BILITOT 0.6 11/25/2021 0310   BILITOT 0.6 11/16/2021 1137       RADIOGRAPHIC STUDIES: I have personally reviewed the radiological images as listed and agreed with the findings in the report. DG CHEST PORT 1 VIEW  Result Date:  11/27/2021 CLINICAL DATA:  Shortness of breath EXAM: PORTABLE CHEST 1 VIEW COMPARISON:  11/26/2021 FINDINGS: Right-sided chest port remains in place. Stable cardiomegaly. Mild pulmonary vascular congestion. Similar degree of interstitial opacities bilaterally. No appreciable pleural fluid collection. No pneumothorax. IMPRESSION: Findings of mild pulmonary edema, stable from prior. Electronically Signed   By: Davina Poke D.O.   On: 11/27/2021 10:09   DG Chest Port 1 View  Result Date: 11/26/2021 CLINICAL DATA:  Acute respiratory distress EXAM: PORTABLE CHEST 1 VIEW COMPARISON:  10/22/2021 FINDINGS: The  lungs are symmetrically expanded. Pulmonary insufflation has decreased slightly since prior examination, however, pulmonary insufflation remains normal. There has developed mild diffuse interstitial pulmonary infiltrate, in keeping with mild interstitial pulmonary edema. No pneumothorax or pleural effusion. Right internal jugular chest port tip is seen at the superior cavoatrial junction. Cardiac size within normal limits. No acute bone abnormality. IMPRESSION: Interval development of mild interstitial pulmonary edema. Electronically Signed   By: Fidela Salisbury M.D.   On: 11/26/2021 19:42   CT ABDOMEN PELVIS W CONTRAST  Result Date: 11/23/2021 CLINICAL DATA:  Abdominal pain.  * Tracking Code: BO * EXAM: CT ABDOMEN AND PELVIS WITH CONTRAST TECHNIQUE: Multidetector CT imaging of the abdomen and pelvis was performed using the standard protocol following bolus administration of intravenous contrast. RADIATION DOSE REDUCTION: This exam was performed according to the departmental dose-optimization program which includes automated exposure control, adjustment of the mA and/or kV according to patient size and/or use of iterative reconstruction technique. CONTRAST:  159m OMNIPAQUE IOHEXOL 300 MG/ML  SOLN COMPARISON:  10/22/2021 and MR pelvis 10/03/2021. FINDINGS: Lower chest: Trace right pleural effusion with  adjacent compressive atelectasis in the right lower lobe. Subsegmental volume loss in the left lower lobe. Atherosclerotic calcification of the aorta and coronary arteries. Catheter tip in the high right atrium. Heart is enlarged. No pericardial effusion. Distal esophagus is grossly unremarkable. Hepatobiliary: Liver is decreased in attenuation diffusely. Biliary ductal dilatation, likely related to cholecystectomy. Pancreas: Negative. Spleen: Negative. Adrenals/Urinary Tract: Adrenal glands are unremarkable. Horseshoe kidney. 3.3 cm fluid density lesion in the right kidney, compatible with a cyst. No follow-up necessary. 1.6 cm low-attenuation lesion in the midline renal parenchyma, too small to characterize. No specific follow-up necessary. Mild right hydronephrosis, new from 10/22/2021, likely due to external compression by a large cystic and solid mass in the anatomic pelvis. Left ureter is decompressed. Bladder is low in volume. Stomach/Bowel: Stomach, small bowel and colon are unremarkable. Appendix is not readily visualized. Vascular/Lymphatic: Atherosclerotic calcification of the aorta. Retroaortic left renal vein. No pathologically enlarged lymph nodes. Small bowel mesenteric lymph nodes are not enlarged by CT size criteria. Reproductive: Large cystic and solid mass in the right paramidline anatomic pelvis, measuring 9.6 x 15.6 cm (2/67), as on 10/22/2021. Mass compresses the uterus inferiorly. Other: Trace pelvic free fluid. 5 mm peritoneal nodule adjacent to the proximal descending colon (2/29), unchanged and indeterminate. Mesenteries and peritoneum are otherwise unremarkable. Musculoskeletal: Degenerative changes in the spine and hips. Minimal grade 1 anterolisthesis of L4 on L5. IMPRESSION: 1. Large cystic and solid mass in the right paramidline anatomic pelvis, compatible with ovarian carcinoma. 2. 5 mm lateral left abdominal peritoneal nodule, indeterminate. Recommend attention on follow-up. 3. Mild  right hydronephrosis, likely secondary to compression by the aforementioned ovarian mass. 4. Trace right pleural effusion. 5. Hepatic steatosis. 6. Aortic atherosclerosis (ICD10-I70.0). Coronary artery calcification. Electronically Signed   By: MLorin PicketM.D.   On: 11/23/2021 15:38   CT Head Wo Contrast  Result Date: 11/23/2021 CLINICAL DATA:  Neuro deficit, acute, stroke suspected. EXAM: CT HEAD WITHOUT CONTRAST TECHNIQUE: Contiguous axial images were obtained from the base of the skull through the vertex without intravenous contrast. RADIATION DOSE REDUCTION: This exam was performed according to the departmental dose-optimization program which includes automated exposure control, adjustment of the mA and/or kV according to patient size and/or use of iterative reconstruction technique. COMPARISON:  Head CT 06/12/2020 and MRI 11/04/2016 FINDINGS: Brain: There is no evidence of an acute infarct, intracranial hemorrhage, mass, midline shift,  or extra-axial fluid collection. Small chronic cortical infarcts are again seen in the left frontal and left occipital lobes. Patchy hypodensities in the cerebral white matter bilaterally are unchanged and nonspecific but compatible with moderate chronic small vessel ischemic disease. There is mild cerebral atrophy. Vascular: Calcified atherosclerosis at the skull base. No hyperdense vessel. Skull: No fracture or suspicious osseous lesion. Sinuses/Orbits: Paranasal sinuses and mastoid air cells are clear. Bilateral cataract extraction. Other: None. IMPRESSION: 1. No evidence of acute intracranial abnormality. 2. Moderate chronic small vessel ischemic disease. Electronically Signed   By: Logan Bores M.D.   On: 11/23/2021 15:29

## 2021-12-17 NOTE — Telephone Encounter (Signed)
Hi Regina James,  Her appt is today, that appt was made a few days ago Please make sure she is aware of the appt and arrive 30 mins early

## 2021-12-17 NOTE — Telephone Encounter (Signed)
Called daughter back. Appt scheduled on 9/12 by scheduler. Daughter and Neda will arrive 30 min early for appt at 2 pm today. Daughter was not aware of appt today and appreciated the call.

## 2021-12-17 NOTE — Assessment & Plan Note (Signed)
Cycle 1 of chemotherapy was complicated by hospitalization, urinary tract infection, strokelike symptoms, generalized weakness and delirium She is now back to her usual self and is enjoying good quality of life CT imaging performed while she was hospitalized show positive response to treatment even with just 1 dose of chemotherapy We have extensive discussions about goals of care It is not clear that the patient wants to proceed with further treatment even with dose reduction or single agent treatment She will think about it and let me know her final decision If she is firm on her decision not to proceed with further chemotherapy, I will get her port removed and refer her for home-based palliative care

## 2021-12-18 DIAGNOSIS — I700 Atherosclerosis of aorta: Secondary | ICD-10-CM | POA: Diagnosis not present

## 2021-12-18 DIAGNOSIS — K760 Fatty (change of) liver, not elsewhere classified: Secondary | ICD-10-CM | POA: Diagnosis not present

## 2021-12-18 DIAGNOSIS — G4700 Insomnia, unspecified: Secondary | ICD-10-CM | POA: Diagnosis not present

## 2021-12-18 DIAGNOSIS — D62 Acute posthemorrhagic anemia: Secondary | ICD-10-CM | POA: Diagnosis not present

## 2021-12-18 DIAGNOSIS — Z720 Tobacco use: Secondary | ICD-10-CM | POA: Diagnosis not present

## 2021-12-18 DIAGNOSIS — C569 Malignant neoplasm of unspecified ovary: Secondary | ICD-10-CM | POA: Diagnosis not present

## 2021-12-18 DIAGNOSIS — E8770 Fluid overload, unspecified: Secondary | ICD-10-CM | POA: Diagnosis not present

## 2021-12-18 DIAGNOSIS — I471 Supraventricular tachycardia: Secondary | ICD-10-CM | POA: Diagnosis not present

## 2021-12-18 DIAGNOSIS — Z8673 Personal history of transient ischemic attack (TIA), and cerebral infarction without residual deficits: Secondary | ICD-10-CM | POA: Diagnosis not present

## 2021-12-18 DIAGNOSIS — Z7982 Long term (current) use of aspirin: Secondary | ICD-10-CM | POA: Diagnosis not present

## 2021-12-18 DIAGNOSIS — D701 Agranulocytosis secondary to cancer chemotherapy: Secondary | ICD-10-CM | POA: Diagnosis not present

## 2021-12-18 DIAGNOSIS — I10 Essential (primary) hypertension: Secondary | ICD-10-CM | POA: Diagnosis not present

## 2021-12-18 DIAGNOSIS — H2513 Age-related nuclear cataract, bilateral: Secondary | ICD-10-CM | POA: Diagnosis not present

## 2021-12-18 DIAGNOSIS — E782 Mixed hyperlipidemia: Secondary | ICD-10-CM | POA: Diagnosis not present

## 2021-12-18 DIAGNOSIS — I6522 Occlusion and stenosis of left carotid artery: Secondary | ICD-10-CM | POA: Diagnosis not present

## 2021-12-18 DIAGNOSIS — I480 Paroxysmal atrial fibrillation: Secondary | ICD-10-CM | POA: Diagnosis not present

## 2021-12-18 DIAGNOSIS — N1330 Unspecified hydronephrosis: Secondary | ICD-10-CM | POA: Diagnosis not present

## 2021-12-18 DIAGNOSIS — N390 Urinary tract infection, site not specified: Secondary | ICD-10-CM | POA: Diagnosis not present

## 2021-12-18 DIAGNOSIS — J910 Malignant pleural effusion: Secondary | ICD-10-CM | POA: Diagnosis not present

## 2021-12-18 DIAGNOSIS — Z86711 Personal history of pulmonary embolism: Secondary | ICD-10-CM | POA: Diagnosis not present

## 2021-12-18 DIAGNOSIS — E876 Hypokalemia: Secondary | ICD-10-CM | POA: Diagnosis not present

## 2021-12-22 ENCOUNTER — Telehealth: Payer: Self-pay | Admitting: *Deleted

## 2021-12-22 ENCOUNTER — Telehealth: Payer: Self-pay

## 2021-12-22 NOTE — Telephone Encounter (Signed)
Attempted to contact patient to schedule a Palliative Care consult appointment. No answer left a message to return call.  

## 2021-12-22 NOTE — Telephone Encounter (Signed)
Patient returned phone message.  Setup follow up appointment for 10/10 at 1:30pm.  Advised patient to arrive around 1:00pm.  Patient understood and confirmed.

## 2021-12-22 NOTE — Telephone Encounter (Signed)
Called and left the patient a message to call the office back; patient needs to be scheduled for a follow up appt with Dr Berline Lopes

## 2021-12-29 ENCOUNTER — Telehealth: Payer: Self-pay

## 2021-12-29 NOTE — Telephone Encounter (Signed)
Called her to get her decision. She does not want anymore chemo or treatment. She wants to keep port for now and does not want palliative care at this time. She will keep appt with Dr. Berline Lopes and talk with her about port.  She will call the office back with her decision on palliative care.

## 2021-12-29 NOTE — Telephone Encounter (Signed)
-----   Message from [name], MD sent at 12/29/2021  8:32 AM EDT ----- Can you ask if she has made any decision on what to do?

## 2021-12-31 ENCOUNTER — Other Ambulatory Visit: Payer: Self-pay | Admitting: Hematology and Oncology

## 2022-01-08 DIAGNOSIS — Z23 Encounter for immunization: Secondary | ICD-10-CM | POA: Diagnosis not present

## 2022-01-08 DIAGNOSIS — C561 Malignant neoplasm of right ovary: Secondary | ICD-10-CM | POA: Diagnosis not present

## 2022-01-11 ENCOUNTER — Ambulatory Visit (HOSPITAL_COMMUNITY): Payer: Medicare Other

## 2022-01-11 ENCOUNTER — Other Ambulatory Visit: Payer: Self-pay | Admitting: Physician Assistant

## 2022-01-11 ENCOUNTER — Other Ambulatory Visit (HOSPITAL_COMMUNITY): Payer: Medicare Other

## 2022-01-11 DIAGNOSIS — I480 Paroxysmal atrial fibrillation: Secondary | ICD-10-CM

## 2022-01-11 DIAGNOSIS — I6523 Occlusion and stenosis of bilateral carotid arteries: Secondary | ICD-10-CM

## 2022-01-11 DIAGNOSIS — I739 Peripheral vascular disease, unspecified: Secondary | ICD-10-CM

## 2022-01-11 DIAGNOSIS — I340 Nonrheumatic mitral (valve) insufficiency: Secondary | ICD-10-CM

## 2022-01-11 DIAGNOSIS — I7121 Aneurysm of the ascending aorta, without rupture: Secondary | ICD-10-CM

## 2022-01-11 DIAGNOSIS — I253 Aneurysm of heart: Secondary | ICD-10-CM

## 2022-01-11 DIAGNOSIS — I471 Supraventricular tachycardia, unspecified: Secondary | ICD-10-CM

## 2022-01-12 ENCOUNTER — Inpatient Hospital Stay: Payer: Medicare Other | Attending: Gynecologic Oncology | Admitting: Gynecologic Oncology

## 2022-01-12 ENCOUNTER — Encounter: Payer: Self-pay | Admitting: Gynecologic Oncology

## 2022-01-12 VITALS — BP 143/85 | HR 81 | Temp 98.1°F | Resp 20 | Wt 154.0 lb

## 2022-01-12 DIAGNOSIS — I471 Supraventricular tachycardia, unspecified: Secondary | ICD-10-CM

## 2022-01-12 DIAGNOSIS — R630 Anorexia: Secondary | ICD-10-CM | POA: Insufficient documentation

## 2022-01-12 DIAGNOSIS — C561 Malignant neoplasm of right ovary: Secondary | ICD-10-CM | POA: Diagnosis not present

## 2022-01-12 DIAGNOSIS — Z9221 Personal history of antineoplastic chemotherapy: Secondary | ICD-10-CM | POA: Insufficient documentation

## 2022-01-12 DIAGNOSIS — R350 Frequency of micturition: Secondary | ICD-10-CM | POA: Diagnosis not present

## 2022-01-12 DIAGNOSIS — C786 Secondary malignant neoplasm of retroperitoneum and peritoneum: Secondary | ICD-10-CM | POA: Insufficient documentation

## 2022-01-12 DIAGNOSIS — Z8673 Personal history of transient ischemic attack (TIA), and cerebral infarction without residual deficits: Secondary | ICD-10-CM

## 2022-01-12 DIAGNOSIS — Z87891 Personal history of nicotine dependence: Secondary | ICD-10-CM | POA: Diagnosis not present

## 2022-01-12 DIAGNOSIS — R32 Unspecified urinary incontinence: Secondary | ICD-10-CM | POA: Insufficient documentation

## 2022-01-12 NOTE — Patient Instructions (Signed)
It was good to see you today. Please call me once you'd have some time to think about what we discussed today and after your visit at [facility].

## 2022-01-12 NOTE — Progress Notes (Signed)
Gynecologic Oncology Return Clinic Visit  01/12/2022  Reason for Visit: Follow-up in the setting of ovarian cancer  Treatment History: Oncology History  Malignant neoplasm of ovary ([place])  09/30/2021 Initial Diagnosis   Patient was seen by her primary care provider at the end of June.  She endorsed decreased appetite as well as abdominal pain for approximately 2 weeks.   10/01/2021 Imaging   CT abdomen and pelvis 1. Large complex pelvic mass suspicious for malignancy arising from the right ovary. Further characterization with MRI and gynecology/surgical consult is advised. 2. No bowel obstruction. 3. Fatty liver. 4. Horseshoe kidney. 5. Aortic Atherosclerosis (ICD10-I70.0).   10/22/2021 Imaging   CT abdomen and pelvis 1. Slightly increased size of the mixed cystic and solid mass presumably arising from the right ovary concerning for primary ovarian malignancy. 2. New small volume abdominopelvic ascites. 3. Horseshoe kidney 4.  Aortic Atherosclerosis (ICD10-I70.0).   10/27/2021 Tumor Marker   Patient's tumor was tested for the following markers: CA-125. Results of the tumor marker test revealed 180.   10/30/2021 Initial Diagnosis   Patient was seen by her primary care provider at the end of June.  She endorsed decreased appetite as well as abdominal pain for approximately 2 weeks   11/03/2021 Pathology Results   FINAL MICROSCOPIC DIAGNOSIS:   A.   CUL DE SAC, ANTERIOR BIOPSY:  -    Adenocarcinoma, present on frozen section slides.   B.   CUL DE SAC, ANTERIOR, #2, BIOPSY:  -    Adenocarcinoma, strongly CDX-2 immunoreactive, most suggestive of gastrointestinal primary.   COMMENT:   The immunophenotype favors a gastrointestinal primary.   The tumor (Block B1) was interrogated with immunohistochemical (IHC) stains (CK7, CK20, CDX-2, PAX8, ER, PR, p53, p16, vimentin).   The tumor is strongly CDX-2 positive and has focal p16 reactivity.  The tumor is negative for CK7, CK20, PAX8, ER,  PR and vimentin.  The p53 is wildtype.  The IHC controls are satisfactory.    11/03/2021 Imaging   MRI pelvis 1. Large mixed solid and cystic mass of the right ovary measuring at least 13.6 x 7.4 x 9.9 cm, highly concerning for primary ovarian malignancy. 2. Mass closely abuts and possibly directly involves the posterior uterine fundus. 3. Normal left ovary. 4. No evidence of pelvic lymphadenopathy. 5. Partially imaged horseshoe kidney.     11/09/2021 Initial Diagnosis   Right ovarian epithelial cancer ([facility])   11/09/2021 Cancer Staging   Staging form: Ovary, Fallopian Tube, and Primary Peritoneal Carcinoma, AJCC 8th Edition - Clinical stage from 11/09/2021: FIGO Stage III (cT3, cN0, cM0) - Signed by [name], MD on 11/09/2021 Stage prefix: Initial diagnosis   11/17/2021 - 11/17/2021 Chemotherapy   Patient is on Treatment Plan : OVARIAN Carboplatin (AUC 6) / Paclitaxel (175) q21d x 6 cycles     11/17/2021 - 11/17/2021 Chemotherapy   Patient is on Treatment Plan : OVARIAN Carboplatin (AUC 6) + Paclitaxel (175) q21d X 6 Cycles     11/20/2021 Tumor Marker   Patient's tumor was tested for the following markers: CA-125. Results of the tumor marker test revealed 89.3.     Interval History: Was hospitalized after cycle 1 of carboplatin and paclitaxel the setting of abdominal pain, dizziness, and malaise.  She was found to have a urinary tract infection as well as volume overload.  Given deconditioning, she was discharged to a skilled nursing facility.  Is now back at home for the last several weeks.  Is overall feeling much better,  continues to have some fatigue but this is improving.  Denies any abdominal or pelvic pain.  Endorses normal bowel function.  Has increased urinary frequency and almost total urinary incontinence.  Continues to have decreased appetite.  Past Medical/Surgical History: Past Medical History:  Diagnosis Date   Atrial septal aneurysm    Cancer ([place])    basal cell  carcinoma   Carotid artery disease ([place])    History of PSVT (paroxysmal supraventricular tachycardia)    Reported back as far as 2010.   Hyperlipidemia    Hypertension    Mitral regurgitation    Palpitations    Penetrating ulcer of aorta ([facility])    Right ovarian epithelial cancer ([facility])    Stroke ([place])    x 3 cannot write as quickly as used to   Thoracic aortic aneurysm [facility])     Past Surgical History:  Procedure Laterality Date   APPENDECTOMY  1993   CERVICAL BIOPSY  W/ LOOP ELECTRODE EXCISION     CHOLECYSTECTOMY  1996   ENDARTERECTOMY Left 11/03/2016   Procedure: Exploration of Carotid  ENDARTERECTOMY.;  Surgeon: Elam Dutch, MD;  Location: [place] [facility];  Service: Vascular;  Laterality: Left;   ENDARTERECTOMY Left 11/03/2016   Procedure: ENDARTERECTOMY CAROTID;  Surgeon: Elam Dutch, MD;  Location: [place];  Service: Vascular;  Laterality: Left;   IR IMAGING GUIDED PORT INSERTION  11/12/2021   LAPAROSCOPY N/A 11/03/2021   Procedure: DIAGNOSTIC LAPAROSCOPY WITH BIOPSY;  Surgeon: Lafonda Mosses, MD;  Location: [facility];  Service: Gynecology;  Laterality: N/A;   LOOP RECORDER INSERTION N/A 11/02/2016   Procedure: Loop Recorder Insertion;  Surgeon: [name], MD;  Location: [place] [facility];  Service: Cardiovascular;  Laterality: N/A;   ROTATOR CUFF REPAIR Right 1990   TEE WITHOUT CARDIOVERSION N/A 11/02/2016   Procedure: TRANSESOPHAGEAL ECHOCARDIOGRAM (TEE);  Surgeon: Lelon Perla, MD;  Location[facility];  Service: Cardiovascular;  Laterality: N/A;   US ECHOCARDIOGRAPHY  01/09/2009   EF 55-60%   WRIST FRACTURE SURGERY      Family History  Problem Relation Age of Onset   Varicose Veins Mother    Cardiomyopathy Mother    Cancer Father        prostate   Diabetes Father    Kidney cancer Brother    Arrhythmia Brother    Colon cancer Neg Hx    Breast cancer Neg Hx    Ovarian cancer Neg Hx    Endometrial cancer Neg Hx    Pancreatic cancer Neg Hx     Social  History   Socioeconomic History   Marital status: Widowed    Spouse name: Wouter   Number of children: 3   Years of education: 14   Highest education level: Not on file  Occupational History    Comment: retired, travel agent  Tobacco Use   Smoking status: Former    Packs/day: 0.25    Years: 45.00    Total pack years: 11.25    Types: Cigarettes    Quit date: 06/10/2016    Years since quitting: 5.5   Smokeless tobacco: Never   Tobacco comments:    Occasionally tobacco use - 1-2 cig a week  Vaping Use   Vaping Use: Never used  Substance and Sexual Activity   Alcohol use: Yes    Alcohol/week: 7.0 standard drinks of alcohol    Types: 7 Glasses of wine per week    Comment: glass of wine 3-4 days per wek  Drug use: No   Sexual activity: Not on file  Other Topics Concern   Not on file  Social History Narrative   Lives at home w/husband   Caffeine- coffee, 2 cups daily   Social Determinants of Health   Financial Resource Strain: Low Risk  (11/17/2021)   Overall Financial Resource Strain (CARDIA)    Difficulty of Paying Living Expenses: Not very hard  Food Insecurity: No Food Insecurity (11/17/2021)   Hunger Vital Sign    Worried About Running Out of Food in the Last Year: Never true    Ran Out of Food in the Last Year: Never true  Transportation Needs: No Transportation Needs (11/17/2021)   PRAPARE - Hydrologist (Medical): No    Lack of Transportation (Non-Medical): No  Physical Activity: Not on file  Stress: Not on file  Social Connections: Not on file    Current Medications:  Current Outpatient Medications:    aspirin 325 MG tablet, Take 1 tablet (325 mg total) by mouth daily., Disp: , Rfl:    cholecalciferol (VITAMIN D3) 25 MCG (1000 UNIT) tablet, Take 1,000 Units by mouth daily., Disp: , Rfl:    doxylamine, Sleep, (SLEEP AID) 25 MG tablet, Take 50 mg by mouth at bedtime., Disp: , Rfl:    latanoprost (XALATAN) 0.005 % ophthalmic solution,  Place 1 drop into the left eye at bedtime., Disp: , Rfl:    metoprolol succinate (TOPROL-XL) 100 MG 24 hr tablet, Take 0.5 tablets (50 mg total) by mouth daily., Disp: , Rfl:    Polyethyl Glycol-Propyl Glycol (SYSTANE) 0.4-0.3 % SOLN, Place 1 drop into both eyes daily as needed for dry eyes. prn, Disp: , Rfl:    pravastatin (PRAVACHOL) 20 MG tablet, TAKE 1 TABLET AT 6PM. (Patient taking differently: Take 20 mg by mouth daily. Per daughter 1/2 tablet daily), Disp: 30 tablet, Rfl: 1   timolol (TIMOPTIC) 0.5 % ophthalmic solution, Place 1 drop into the left eye 2 (two) times daily., Disp: , Rfl:   Review of Systems: + Decreased appetite, hearing loss, urinary frequency. Denies chills, fatigue, unexplained weight changes. Denies hearing loss, neck lumps or masses, mouth sores, ringing in ears or voice changes. Denies cough or wheezing.  Denies shortness of breath. Denies chest pain or palpitations. Denies leg swelling. Denies abdominal distention, pain, blood in stools, constipation, diarrhea, nausea, vomiting, or early satiety. Denies pain with intercourse, dysuria, hematuria. Denies hot flashes, pelvic pain, vaginal bleeding or vaginal discharge.   Denies joint pain, back pain or muscle pain/cramps. Denies itching, rash, or wounds. Denies dizziness, headaches, numbness or seizures. Denies swollen lymph nodes or glands, denies easy bruising or bleeding. Denies anxiety, depression, confusion, or decreased concentration.  Physical Exam: BP (!) 143/85 (BP Location: Right Arm, Patient Position: Sitting)   Pulse 81   Temp 98.1 F (36.7 C) (Oral)   Resp 20   Wt 154 lb (69.9 kg)   BMI 24.86 kg/m  General: Alert, oriented, no acute distress. HEENT: Normocephalic, atraumatic, sclera anicteric. Chest: Unlabored breathing on room air.  Laboratory & Radiologic Studies: None new  Assessment & Plan: Regina James is a 88 y.o. woman with Stage IIIB ovarian cancer who presents for follow-up  after cycle #1 of chemotherapy.  Patient has recovered significantly since her hospitalization.    Patient is hesitant about reinitiation of any systemic therapy.  She is very much considering no additional chemotherapy.  We discussed options that were previously reviewed with her medical oncologist  including proceeding with carboplatin alone or dose reducing carboplatin and paclitaxel.  I also discussed today the option of reconsidering surgery.  Although I think it is unlikely that I would be able to optimally cytoreduced given findings at the time of her first surgery, she may have had some reduction of mesenteric and peritoneal disease with the chemotherapy she received.  Her urinary symptoms are almost certainly related to the size of her pelvic mass and its pressure on her bladder.  Luckily, she otherwise remains fairly asymptomatic.  Although biopsy from surgery showed adenocarcinoma favoring a GI origin, based on our discussion at tumor board with review of images her CT scan and pictures from surgery, this was favored to be a GYN primary cancer.  The patient endorsed having a colonoscopy within the last 5 years.  She has previously undergone an appendectomy in the 1990s.  No colonic lesions were noted on CT scan.  Her CEA at the time of presentation was less than 5 and Ca1 25 was elevated at 180.    The patient tells me today she thinks she has decided against any further chemotherapy or consideration of surgery.  She is contemplating getting a second opinion at [facility].  She will contact me once she is done this and is ready to make any decisions moving forward regarding her care.  28 minutes of total time was spent for this patient encounter, including preparation, face-to-face counseling with the patient and coordination of care, and documentation of the encounter.  Jeral Pinch, MD  Division of Gynecologic Oncology  Department of Obstetrics and Gynecology  [facility]

## 2022-01-19 ENCOUNTER — Ambulatory Visit (HOSPITAL_COMMUNITY): Payer: Medicare Other

## 2022-01-19 ENCOUNTER — Telehealth: Payer: Self-pay | Admitting: Internal Medicine

## 2022-01-19 ENCOUNTER — Ambulatory Visit (HOSPITAL_COMMUNITY)
Admission: RE | Admit: 2022-01-19 | Discharge: 2022-01-19 | Disposition: A | Discharge status: Home / Self Care | Payer: Medicare Other | Source: Ambulatory Visit | Attending: Physician Assistant | Admitting: Physician Assistant

## 2022-01-19 DIAGNOSIS — I6523 Occlusion and stenosis of bilateral carotid arteries: Secondary | ICD-10-CM

## 2022-01-19 DIAGNOSIS — I253 Aneurysm of heart: Secondary | ICD-10-CM | POA: Insufficient documentation

## 2022-01-19 DIAGNOSIS — I340 Nonrheumatic mitral (valve) insufficiency: Secondary | ICD-10-CM | POA: Insufficient documentation

## 2022-01-19 DIAGNOSIS — I480 Paroxysmal atrial fibrillation: Secondary | ICD-10-CM | POA: Diagnosis not present

## 2022-01-19 DIAGNOSIS — I7121 Aneurysm of the ascending aorta, without rupture: Secondary | ICD-10-CM | POA: Diagnosis not present

## 2022-01-19 DIAGNOSIS — I739 Peripheral vascular disease, unspecified: Secondary | ICD-10-CM | POA: Diagnosis not present

## 2022-01-19 DIAGNOSIS — I471 Supraventricular tachycardia, unspecified: Secondary | ICD-10-CM

## 2022-01-19 NOTE — Telephone Encounter (Signed)
Noted thanks °

## 2022-01-19 NOTE — Telephone Encounter (Signed)
Pt wanted to let Dr. Debara Pickett know that she had to r/s her echo for the day before her appt on 02/02/22.

## 2022-01-28 DIAGNOSIS — R54 Age-related physical debility: Secondary | ICD-10-CM | POA: Diagnosis not present

## 2022-01-28 DIAGNOSIS — Z7189 Other specified counseling: Secondary | ICD-10-CM | POA: Diagnosis not present

## 2022-01-28 DIAGNOSIS — C561 Malignant neoplasm of right ovary: Secondary | ICD-10-CM | POA: Diagnosis not present

## 2022-02-01 ENCOUNTER — Ambulatory Visit: Payer: Medicare Other | Attending: Cardiology

## 2022-02-01 DIAGNOSIS — I7121 Aneurysm of the ascending aorta, without rupture: Secondary | ICD-10-CM | POA: Diagnosis not present

## 2022-02-01 DIAGNOSIS — I6523 Occlusion and stenosis of bilateral carotid arteries: Secondary | ICD-10-CM | POA: Insufficient documentation

## 2022-02-01 DIAGNOSIS — I340 Nonrheumatic mitral (valve) insufficiency: Secondary | ICD-10-CM | POA: Diagnosis not present

## 2022-02-01 DIAGNOSIS — I739 Peripheral vascular disease, unspecified: Secondary | ICD-10-CM

## 2022-02-01 DIAGNOSIS — I480 Paroxysmal atrial fibrillation: Secondary | ICD-10-CM | POA: Diagnosis not present

## 2022-02-01 DIAGNOSIS — I253 Aneurysm of heart: Secondary | ICD-10-CM

## 2022-02-01 DIAGNOSIS — Z01810 Encounter for preprocedural cardiovascular examination: Secondary | ICD-10-CM

## 2022-02-01 DIAGNOSIS — I471 Supraventricular tachycardia, unspecified: Secondary | ICD-10-CM

## 2022-02-01 LAB — ECHOCARDIOGRAM COMPLETE
Area-P 1/2: 2.55 cm2
S' Lateral: 3.1 cm

## 2022-02-02 ENCOUNTER — Encounter: Payer: Self-pay | Admitting: Internal Medicine

## 2022-02-02 ENCOUNTER — Ambulatory Visit (INDEPENDENT_AMBULATORY_CARE_PROVIDER_SITE_OTHER): Payer: Medicare Other | Admitting: Internal Medicine

## 2022-02-02 VITALS — BP 130/88 | HR 67 | Ht 66.5 in | Wt 157.6 lb

## 2022-02-02 DIAGNOSIS — I471 Supraventricular tachycardia, unspecified: Secondary | ICD-10-CM | POA: Diagnosis not present

## 2022-02-02 DIAGNOSIS — I6523 Occlusion and stenosis of bilateral carotid arteries: Secondary | ICD-10-CM

## 2022-02-02 DIAGNOSIS — I253 Aneurysm of heart: Secondary | ICD-10-CM | POA: Diagnosis not present

## 2022-02-02 DIAGNOSIS — I7121 Aneurysm of the ascending aorta, without rupture: Secondary | ICD-10-CM | POA: Diagnosis not present

## 2022-02-02 DIAGNOSIS — I340 Nonrheumatic mitral (valve) insufficiency: Secondary | ICD-10-CM | POA: Diagnosis not present

## 2022-02-02 DIAGNOSIS — Z01810 Encounter for preprocedural cardiovascular examination: Secondary | ICD-10-CM | POA: Diagnosis not present

## 2022-02-02 DIAGNOSIS — I739 Peripheral vascular disease, unspecified: Secondary | ICD-10-CM | POA: Diagnosis not present

## 2022-02-02 NOTE — Progress Notes (Signed)
OFFICE NOTE  Chief Complaint:  Follow-up Echo, carotid dopplers  Primary Care Physician: Kathyrn Lass, MD  HPI:  Regina James is a 88 y.o. female with a past medial history significant for PSVT, followed by Dr. Mare Ferrari in the past.  Recently she presented with a stroke, thought to be atheroembolic from the carotid arteries.  She underwent left carotid endarterectomy, complicated by hematoma requiring repeat surgery and evacuation.  Since then she has done well.  She is on full dose aspirin, losartan, metoprolol and pravastatin.  LDL cholesterol in July was 89.  Her goal LDL is less than 70.  06/08/2018  Regina James returns today for follow-up of stroke and SVT.  She denies any recurrent SVT and has had no further stroke or TIA symptoms.  Her most recent lipid profile was abnormal in June 2019 showing total cholesterol 173, HDL 43, LDL at goal of 69 however triglycerides remain elevated at 303.  She is on moderate intensity pravastatin 20 mg daily.  We discussed the newer data regarding the REDUCE-IT trial suggesting that given her history of stroke and PAD with carotid artery disease, she is at higher risk of events particularly with persistent cardiovascular risk associated with elevated triglycerides.  12/09/2019  Regina James is seen today in follow-up.  Overall she is doing well.  EKG shows sinus rhythm with sinus arrhythmia.  She had labs in October 2020 with an LDL of 73.  Blood pressure was a little elevated today at 142/89.  She is on metoprolol and losartan.  She also takes low-dose pravastatin 20 mg.  I remain elevated at 243.  02/02/2022  Regina James is seen today in follow-up.  She was seen over the summer by Sharrell Ku, PA-C who ordered repeat carotid Dopplers and an echocardiogram.  The carotid Doppler showed mild to moderate carotid stenosis.  The echo, however was more concerning showing enlargement of her thoracic aorta now up to 53 mm, this compares to a 48 mm dilatation on CT in  2022.  We discussed this in great detail today including the fact that she is at this point considered a candidate for operative repair of this aneurysm and would be at increased risk of rupture if it were to further enlarge.  Despite this, she said that she is not interested in pursuing this.  This is also in light of the fact that she was diagnosed with stage III ovarian cancer recently.  She underwent a round of chemotherapy for this and became "deathly ill".  She has declined any further chemotherapy for this.  She understands that not operating on her aneurysm would likely lead to further progression of the aneurysm and high risk of rupture which would be ultimately fatal.  PMHx:  Past Medical History:  Diagnosis Date   Atrial septal aneurysm    Cancer ([place])    basal cell carcinoma   Carotid artery disease ([place])    History of PSVT (paroxysmal supraventricular tachycardia)    Reported back as far as 2010.   Hyperlipidemia    Hypertension    Mitral regurgitation    Palpitations    Penetrating ulcer of aorta (HCC)    Right ovarian epithelial cancer (HCC)    Stroke ([place])    x 3 cannot write as quickly as used to   Thoracic aortic aneurysm [facility])     Past Surgical History:  Procedure Laterality Date   APPENDECTOMY  1993   CERVICAL BIOPSY  W/ LOOP ELECTRODE EXCISION  CHOLECYSTECTOMY  1996   ENDARTERECTOMY Left 11/03/2016   Procedure: Exploration of Carotid  ENDARTERECTOMY.;  Surgeon: Elam Dutch, MD;  Location: [place];  Service: Vascular;  Laterality: Left;   ENDARTERECTOMY Left 11/03/2016   Procedure: ENDARTERECTOMY CAROTID;  Surgeon: Elam Dutch, MD;  Location: [place];  Service: Vascular;  Laterality: Left;   IR IMAGING GUIDED PORT INSERTION  11/12/2021   LAPAROSCOPY N/A 11/03/2021   Procedure: DIAGNOSTIC LAPAROSCOPY WITH BIOPSY;  Surgeon: Lafonda Mosses, MD;  Location: [facility];  Service: Gynecology;  Laterality: N/A;   LOOP RECORDER INSERTION N/A 11/02/2016    Procedure: Loop Recorder Insertion;  Surgeon: James Grayer, MD;  Location: [facility];  Service: Cardiovascular;  Laterality: N/A;   ROTATOR CUFF REPAIR Right 1990   TEE WITHOUT CARDIOVERSION N/A 11/02/2016   Procedure: TRANSESOPHAGEAL ECHOCARDIOGRAM (TEE);  Surgeon: Lelon Perla, MD;  Location: [facility] END[facility];  Service: Cardiovascular;  Laterality: N/A;   US ECHOCARDIOGRAPHY  01/09/2009   EF 55-60%   WRIST FRACTURE SURGERY      FAMHx:  Family History  Problem Relation Age of Onset   Varicose Veins Mother    Cardiomyopathy Mother    Cancer Father        prostate   Diabetes Father    Kidney cancer Brother    Arrhythmia Brother    Colon cancer Neg Hx    Breast cancer Neg Hx    Ovarian cancer Neg Hx    Endometrial cancer Neg Hx    Pancreatic cancer Neg Hx     SOCHx:   reports that she quit smoking about 5 years ago. Her smoking use included cigarettes. She has a 11.25 pack-year smoking history. She has never used smokeless tobacco. She reports current alcohol use of about 7.0 standard drinks of alcohol per week. She reports that she does not use drugs.  ALLERGIES:  Allergies  Allergen Reactions   Losartan Anaphylaxis    Per daughter and Patient, states that provider suggested she is having allergic reaction to medication   Dorzolamide Hcl-Timolol Mal Itching    Made vision very blurry and itching.Eyes very red.   Morphine And Related Swelling   Levofloxacin Anxiety and Other (See Comments)    Double vision    ROS: Pertinent items noted in HPI and remainder of comprehensive ROS otherwise negative.  HOME MEDS: Current Outpatient Medications on File Prior to Visit  Medication Sig Dispense Refill   aspirin 325 MG tablet Take 1 tablet (325 mg total) by mouth daily.     cholecalciferol (VITAMIN D3) 25 MCG (1000 UNIT) tablet Take 1,000 Units by mouth daily.     doxylamine, Sleep, (SLEEP AID) 25 MG tablet Take 50 mg by mouth at bedtime.     latanoprost (XALATAN)  0.005 % ophthalmic solution Place 1 drop into the left eye at bedtime.     metoprolol succinate (TOPROL-XL) 100 MG 24 hr tablet Take 0.5 tablets (50 mg total) by mouth daily.     Multiple Vitamins-Minerals (PRESERVISION/LUTEIN PO) Take by mouth.     Polyethyl Glycol-Propyl Glycol (SYSTANE) 0.4-0.3 % SOLN Place 1 drop into both eyes daily as needed for dry eyes. prn     pravastatin (PRAVACHOL) 20 MG tablet TAKE 1 TABLET AT 6PM. (Patient taking differently: Take 20 mg by mouth daily. Per daughter 1/2 tablet daily) 30 tablet 1   timolol (TIMOPTIC) 0.5 % ophthalmic solution Place 1 drop into the left eye 2 (two) times daily.     [DISCONTINUED]  prochlorperazine (COMPAZINE) 10 MG tablet Take 1 tablet (10 mg total) by mouth every 6 (six) hours as needed (Nausea or vomiting). 30 tablet 1   No current facility-administered medications on file prior to visit.    LABS/IMAGING: No results found for this or any previous visit (from the past 48 hour(s)). ECHOCARDIOGRAM COMPLETE  Result Date: 02/01/2022    ECHOCARDIOGRAM REPORT   Patient Name:   Regina James Date of Exam: 02/01/2022 Medical Rec #:  [id number]       Height:       66.0 in Accession #:    [id number]      Weight:       154.0 lb Date of Birth:  [birth year]       BSA:          1.790 m Patient Age:    46 years        BP:           143/85 mmHg Patient Gender: F               HR:           69 bpm. Exam Location:  [address] Procedure: 2D Echo, 3D Echo, Color Doppler and Cardiac Doppler Indications:    Mitral Regurgitation; ; Z01.818 Encounter for other                 preprocedural examination  History:        Patient has prior history of Echocardiogram examinations, most                 recent 06/28/2016. Arrythmias:Atrial Fibrillation; Risk                 Factors:Hypertension and Dyslipidemia.  Sonographer:    Mikki Santee RDCS Referring Phys: Melina Copa, N IMPRESSIONS  1. Severely dilated ascending aorta (53 mm). 48 mm on CT in 2022.  2. Left  ventricular ejection fraction by 3D volume is 57 %. The left ventricle has normal function. The left ventricle has no regional wall motion abnormalities. There is mild left ventricular hypertrophy. Left ventricular diastolic parameters are consistent with Grade I diastolic dysfunction (impaired relaxation).  3. Right ventricular systolic function is normal. The right ventricular size is normal. There is normal pulmonary artery systolic pressure.  4. Left atrial size was mildly dilated.  5. The mitral valve is normal in structure. Mild mitral valve regurgitation. No evidence of mitral stenosis.  6. The aortic valve is tricuspid. Aortic valve regurgitation is not visualized. No aortic stenosis is present.  7. Aortic dilatation noted. There is severe dilatation of the ascending aorta, measuring 53 mm.  8. The inferior vena cava is normal in size with greater than 50% respiratory variability, suggesting right atrial pressure of 3 mmHg. Comparison(s): Changes from prior study are noted. FINDINGS  Left Ventricle: Left ventricular ejection fraction by 3D volume is 57 %. The left ventricle has normal function. The left ventricle has no regional wall motion abnormalities. The left ventricular internal cavity size was normal in size. There is mild left ventricular hypertrophy. Left ventricular diastolic parameters are consistent with Grade I diastolic dysfunction (impaired relaxation). Right Ventricle: The right ventricular size is normal. No increase in right ventricular wall thickness. Right ventricular systolic function is normal. There is normal pulmonary artery systolic pressure. The tricuspid regurgitant velocity is 2.37 m/s, and  with an assumed right atrial pressure of 3 mmHg, the estimated right ventricular systolic pressure is 94.8 mmHg. Left Atrium:  Left atrial size was mildly dilated. Right Atrium: Right atrial size was normal in size. Pericardium: There is no evidence of pericardial effusion. Mitral Valve: The  mitral valve is normal in structure. Mild mitral valve regurgitation. No evidence of mitral valve stenosis. Tricuspid Valve: The tricuspid valve is normal in structure. Tricuspid valve regurgitation is mild . No evidence of tricuspid stenosis. Aortic Valve: The aortic valve is tricuspid. Aortic valve regurgitation is not visualized. No aortic stenosis is present. Pulmonic Valve: The pulmonic valve was normal in structure. Pulmonic valve regurgitation is trivial. No evidence of pulmonic stenosis. Aorta: Aortic root could not be assessed and aortic dilatation noted. There is severe dilatation of the ascending aorta, measuring 53 mm. Venous: The inferior vena cava is normal in size with greater than 50% respiratory variability, suggesting right atrial pressure of 3 mmHg. IAS/Shunts: No atrial level shunt detected by color flow Doppler. Additional Comments: Severely dilated ascending aorta (53 mm). 48 mm on CT in 2022.  LEFT VENTRICLE PLAX 2D LVIDd:         4.20 cm         Diastology LVIDs:         3.10 cm         LV e' medial:    6.13 cm/s LV PW:         0.90 cm         LV E/e' medial:  10.1 LV IVS:        1.20 cm         LV e' lateral:   4.73 cm/s LVOT diam:     2.10 cm         LV E/e' lateral: 13.1 LV SV:         66 LV SV Index:   37 LVOT Area:     3.46 cm        3D Volume EF                                LV 3D EF:    Left                                             ventricul                                             ar                                             ejection                                             fraction                                             by 3D  volume is                                             57 %.                                 3D Volume EF:                                3D EF:        57 %                                LV EDV:       91 ml                                LV ESV:       40 ml                                LV SV:         51 ml RIGHT VENTRICLE RV Basal diam:  3.90 cm RV Mid diam:    3.00 cm RV S prime:     9.73 cm/s TAPSE (M-mode): 1.6 cm LEFT ATRIUM             Index        RIGHT ATRIUM           Index LA diam:        3.40 cm 1.90 cm/m   RA Area:     16.30 cm LA Vol (A2C):   74.9 ml 41.85 ml/m  RA Volume:   41.90 ml  23.41 ml/m LA Vol (A4C):   44.6 ml 24.92 ml/m LA Biplane Vol: 58.5 ml 32.69 ml/m  AORTIC VALVE LVOT Vmax:   82.50 cm/s LVOT Vmean:  55.000 cm/s LVOT VTI:    0.191 m  AORTA Ao Root diam: 3.10 cm Ao Asc diam:  5.25 cm MITRAL VALVE               TRICUSPID VALVE MV Area (PHT): 2.55 cm    TR Peak grad:   22.5 mmHg MV Decel Time: 298 msec    TR Vmax:        237.00 cm/s MV E velocity: 61.80 cm/s MV A velocity: 94.90 cm/s  SHUNTS MV E/A ratio:  0.65        Systemic VTI:  0.19 m                            Systemic Diam: 2.10 cm Candee Furbish MD Electronically signed by Candee Furbish MD Signature Date/Time: 02/01/2022/8:26:07 AM    Final     LIPID PANEL:    Component Value Date/Time   CHOL 147 11/01/2016 0341   TRIG 71 11/04/2016 0358   HDL 42 11/01/2016 0341   CHOLHDL 3.5 11/01/2016 0341   VLDL 16 11/01/2016 0341   LDLCALC 89 11/01/2016 0341     WEIGHTS: Wt Readings from Last 3 Encounters:  02/02/22 157 lb 9.6 oz (  71.5 kg)  01/12/22 154 lb (69.9 kg)  12/17/21 157 lb 12.8 oz (71.6 kg)    VITALS: BP 130/88   Pulse 67   Ht 5' 6.5" (1.689 m)   Wt 157 lb 9.6 oz (71.5 kg)   SpO2 98%   BMI 25.06 kg/m   EXAM: General appearance: alert and no distress Neck: no carotid bruit, no JVD and thyroid not enlarged, symmetric, no tenderness/mass/nodules Lungs: clear to auscultation bilaterally Heart: regular rate and rhythm Abdomen: soft, non-tender; bowel sounds normal; no masses,  no organomegaly Extremities: extremities normal, atraumatic, no cyanosis or edema Pulses: 2+ and symmetric Skin: Skin color, texture, turgor normal. No rashes or lesions Neurologic: Grossly normal Psych:  Pleasant  EKG: Normal sinus rhythm at 67, minimal voltage criteria for LVH-personally reviewed  ASSESSMENT: Ascending aortic aneurysm-53 mm by echo (02/01/2022), expanded from 48 mm in 2022 History of PSVT Recent stroke-likely secondary to atheroembolism Carotid artery disease status post left CEA Normal LV function on TEE with atrial septal aneurysm, but negative bubble study Status post implanted loop recorder  PLAN: 1.   Mrs. Gilmore has an ascending aortic aneurysm now measuring 53 mm by echo.  This is at a point where she should likely have surgery.  Despite this, she says that she is not interested in an operation at this time.  She had recently been diagnosed with stage III ovarian cancer and did not tolerate chemotherapy.  She says that her options are limited with regards to this.  I talked about the extent of aortic aneurysm surgery.  I think she could actually tolerated however the question is what her lifespan would be with regards to her ovarian cancer.  She says she is thought quite a bit about this and is not interested in surgery at this time.  Based on this, I do not think we need to follow repeat imaging studies if she is not interested in pursuing surgery.  She states that she ready has her affairs in order and is discussed with her family about her medical conditions.  Follow-up with me as needed.  Pixie Casino, MD, [facility], [place] Director of the Advanced Lipid Disorders &  Cardiovascular Risk Reduction Clinic Diplomate of the American Board of Clinical Lipidology Attending Cardiologist  Direct Dial: [phone]  Fax: [phone]  Website:  [email or web address] [email or web address] Trinka 02/02/2022, 9:09 AM

## 2022-02-02 NOTE — Patient Instructions (Signed)
Medication Instructions:  NO CHANGES  *If you need a refill on your cardiac medications before your next appointment, please call your pharmacy*    Follow-Up: At [facility], you and your health needs are our priority.  As part of our continuing mission to provide you with exceptional heart care, we have created designated Provider Care Teams.  These Care Teams include your primary Cardiologist (physician) and Advanced Practice Providers (APPs -  Physician Assistants and Nurse Practitioners) who all work together to provide you with the care you need, when you need it.  We recommend signing up for the patient portal called "MyChart".  Sign up information is provided on this After Visit Summary.  MyChart is used to connect with patients for Virtual Visits (Telemedicine).  Patients are able to view lab/test results, encounter notes, upcoming appointments, etc.  Non-urgent messages can be sent to your provider as well.   To learn more about what you can do with MyChart, go to [email or web address].    Your next appointment:    AS NEEDED with Dr. Hilty  

## 2022-02-06 DIAGNOSIS — C801 Malignant (primary) neoplasm, unspecified: Secondary | ICD-10-CM | POA: Diagnosis not present

## 2022-02-06 DIAGNOSIS — C7989 Secondary malignant neoplasm of other specified sites: Secondary | ICD-10-CM | POA: Diagnosis not present

## 2022-02-06 DIAGNOSIS — Z171 Estrogen receptor negative status [ER-]: Secondary | ICD-10-CM | POA: Diagnosis not present

## 2022-02-09 DIAGNOSIS — L820 Inflamed seborrheic keratosis: Secondary | ICD-10-CM | POA: Diagnosis not present

## 2022-02-09 DIAGNOSIS — L814 Other melanin hyperpigmentation: Secondary | ICD-10-CM | POA: Diagnosis not present

## 2022-02-09 DIAGNOSIS — L821 Other seborrheic keratosis: Secondary | ICD-10-CM | POA: Diagnosis not present

## 2022-02-09 DIAGNOSIS — D1801 Hemangioma of skin and subcutaneous tissue: Secondary | ICD-10-CM | POA: Diagnosis not present

## 2022-02-09 DIAGNOSIS — Z85828 Personal history of other malignant neoplasm of skin: Secondary | ICD-10-CM | POA: Diagnosis not present

## 2022-02-09 DIAGNOSIS — L570 Actinic keratosis: Secondary | ICD-10-CM | POA: Diagnosis not present

## 2022-02-23 DIAGNOSIS — Z1389 Encounter for screening for other disorder: Secondary | ICD-10-CM | POA: Diagnosis not present

## 2022-02-23 DIAGNOSIS — Z0000 Encounter for general adult medical examination without abnormal findings: Secondary | ICD-10-CM | POA: Diagnosis not present

## 2022-02-23 DIAGNOSIS — Z6825 Body mass index (BMI) 25.0-25.9, adult: Secondary | ICD-10-CM | POA: Diagnosis not present

## 2022-02-23 DIAGNOSIS — E782 Mixed hyperlipidemia: Secondary | ICD-10-CM | POA: Diagnosis not present

## 2022-03-01 ENCOUNTER — Other Ambulatory Visit: Payer: Self-pay | Admitting: Hematology and Oncology

## 2022-03-01 DIAGNOSIS — C569 Malignant neoplasm of unspecified ovary: Secondary | ICD-10-CM

## 2022-03-02 ENCOUNTER — Other Ambulatory Visit (HOSPITAL_COMMUNITY): Payer: Self-pay | Admitting: Family Medicine

## 2022-03-02 DIAGNOSIS — I491 Atrial premature depolarization: Secondary | ICD-10-CM | POA: Diagnosis not present

## 2022-03-02 DIAGNOSIS — Z8673 Personal history of transient ischemic attack (TIA), and cerebral infarction without residual deficits: Secondary | ICD-10-CM | POA: Diagnosis not present

## 2022-03-02 DIAGNOSIS — R030 Elevated blood-pressure reading, without diagnosis of hypertension: Secondary | ICD-10-CM | POA: Diagnosis not present

## 2022-03-02 DIAGNOSIS — Z23 Encounter for immunization: Secondary | ICD-10-CM | POA: Diagnosis not present

## 2022-03-02 DIAGNOSIS — M7989 Other specified soft tissue disorders: Secondary | ICD-10-CM

## 2022-03-02 DIAGNOSIS — R2242 Localized swelling, mass and lump, left lower limb: Secondary | ICD-10-CM | POA: Diagnosis not present

## 2022-03-02 DIAGNOSIS — Z6825 Body mass index (BMI) 25.0-25.9, adult: Secondary | ICD-10-CM | POA: Diagnosis not present

## 2022-03-02 DIAGNOSIS — C569 Malignant neoplasm of unspecified ovary: Secondary | ICD-10-CM | POA: Diagnosis not present

## 2022-03-03 ENCOUNTER — Ambulatory Visit (HOSPITAL_COMMUNITY)
Admission: RE | Admit: 2022-03-03 | Discharge: 2022-03-03 | Disposition: A | Discharge status: Home / Self Care | Payer: Medicare Other | Source: Ambulatory Visit | Attending: Family Medicine | Admitting: Family Medicine

## 2022-03-03 DIAGNOSIS — M7989 Other specified soft tissue disorders: Secondary | ICD-10-CM

## 2022-03-03 NOTE — Progress Notes (Signed)
Lower extremity venous duplex has been completed.   Preliminary results in CV Proc.   Regina James 03/03/2022 11:23 AM

## 2022-03-04 DIAGNOSIS — Z961 Presence of intraocular lens: Secondary | ICD-10-CM | POA: Diagnosis not present

## 2022-03-04 DIAGNOSIS — H353131 Nonexudative age-related macular degeneration, bilateral, early dry stage: Secondary | ICD-10-CM | POA: Diagnosis not present

## 2022-03-04 DIAGNOSIS — H43813 Vitreous degeneration, bilateral: Secondary | ICD-10-CM | POA: Diagnosis not present

## 2022-03-04 DIAGNOSIS — H401122 Primary open-angle glaucoma, left eye, moderate stage: Secondary | ICD-10-CM | POA: Diagnosis not present

## 2022-03-05 ENCOUNTER — Telehealth: Payer: Self-pay

## 2022-03-05 NOTE — Telephone Encounter (Signed)
PC SW scheduled an initial palliative care visit with patient. RN/SW scheduled a visit for 03/09/22 '@10'$  am at her home.

## 2022-03-09 ENCOUNTER — Other Ambulatory Visit: Payer: Medicare Other

## 2022-03-09 ENCOUNTER — Other Ambulatory Visit: Payer: Medicare Other | Admitting: *Deleted

## 2022-03-09 VITALS — BP 152/91 | HR 71 | Temp 97.8°F | Resp 17

## 2022-03-09 DIAGNOSIS — Z515 Encounter for palliative care: Secondary | ICD-10-CM

## 2022-03-10 DIAGNOSIS — J9811 Atelectasis: Secondary | ICD-10-CM | POA: Diagnosis not present

## 2022-03-10 NOTE — Progress Notes (Signed)
[facility] RN NOTE  PATIENT NAME: Regina James DOB: [birth year] MRN: [id number]  PRIMARY CARE PROVIDER: Kathyrn Lass, MD  RESPONSIBLE PARTY: Santina Evans (daughter) Acct ID - Guarantor Home Phone Work Phone Relationship Acct Type  [id number] NYEMA, [phone]  Self P/F     [address] [place], [place] [address]    RN/SW team completed follow up visit in her home. Daughter also present.  Respiratory: Patient reports persistent cough for the past 5 days. It is some better today. It is worse at night when she lies down in bed. She has been taking Mucinex once daily. Cough is semi-productive. Phlegm is white. Cough is congested. Chest pain is present when she coughs. Chest x-ray ordered due to cough/congestion/chest pain.  HISTORY OF PRESENT ILLNESS:    CODE STATUS:   Code Status: Prior  ADVANCED DIRECTIVES: N MOST FORM: {Responses; yes/no} PPS: {NUMBERS 0%-100%:21292}   PHYSICAL EXAM:   VITALS: Today's Vitals   03/09/22 2237  BP: (!) 152/91  Pulse: 71  Resp: 17  Temp: 97.8 F (36.6 C)  TempSrc: Temporal  SpO2: 94%  PainSc: 2   PainLoc: Chest    LUNGS: {SYSTEM LUNGS ADULT/PED XJOI:32549} CARDIAC: {Mis exam cardio:32073}} *** EXTREMITIES: {Exam; extremity:10330} SKIN: {Findings; skin exam-one line:31329::"Skin color, texture, turgor normal. No rashes or lesions"}  NEURO: {Findings; ROS neuro:30532::"negative"}       Daryl Eastern, RN

## 2022-03-12 NOTE — Progress Notes (Signed)
COMMUNITY PALLIATIVE CARE SW NOTE  PATIENT NAME: Regina James DOB: [birth year] MRN: [id number]  PRIMARY CARE PROVIDER: Kathyrn Lass, MD  RESPONSIBLE PARTY:  Acct ID - Guarantor Home Phone Work Phone Relationship Acct Type  [id number] ALANNAH, [phone]  Self P/F     [address] [place], [place] [address]   SW and RN-M. Nadara Mustard completed a joint visit with patient at her home. She was present with her daughter-[name]. The team provided education to her regarding palliative care services, and she was provide a folder with additional education literature and contact information. Patient provided verbal consent to services. Patient diagnosis is Ovarian Cancer.  PLAN OF CARE and INTERVENTIONS:             GOALS OF CARE/ ADVANCE CARE PLANNING:  Patient's goal is to remain independent and in her home. Patient does not desire cancer treatment.Patient is a DNR. SOCIAL/EMOTIONAL/SPIRITUAL ASSESSMENT/ INTERVENTIONS:  Patient was report that she has a sore throat and cough that started on Friday. She is taking Mucinex and is feeling better overall. However, the cough seems to more persistent at night when she lies down. The cough produces a pale white mucus. She report that she received her pneumonia shot last week, but her doctor is now encouraging her to take the RSV shot. Patient does have pain to her chest when she coughs. She also reports some mild pain when she tries to get up. Patient is independent for ADL's but take up to an hour and half to complete. Patient report that her balance is not good and she worries about ambulating. She has a walker but does not use it. She continues to drive. Her appetite is better since she is no longer receiving chemotherapy. She is eating at least two small meals per day with a snack in between. She reported no swallowing issues. Patient has some swelling in her left foot and ankle. Patient is incontinent of urine and wears Depends undergarments.  Patient has had one round of chemotherapy in August. Afterwards she became very sick and ended up in the hospital. The plan was 3 rounds of chemotherapy 1x/week for 3 weeks, MRI then surgery. Patient has opted against any additional treatment. Patient take Miralax PRN to regulate bowels. RN provided interventions to treat sore throat/cough and better manage bowel regiment. PATIENT/CAREGIVER EDUCATION/ COPING:  Patient appears to be coping well. Although she has some changes/decline in her physical symptoms, she continues to try to be active. Her daughter is supportive and is active in her care. PERSONAL EMERGENCY PLAN:  Patient is capable of activating 911 for emergencies. She also has a Building services engineer. COMMUNITY RESOURCES COORDINATION/ HEALTH CARE NAVIGATION:  Patient has in-home caregivers through [employer], MWF, 9 am- 1 pm.  FINANCIAL/LEGAL CONCERNS/INTERVENTIONS: No financial concerns.     SOCIAL HX:  Patient was born and raised in [place], [place]. She attended college. 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  She is twice widowed. She has three children. Patient is of Christian faith.   Social History   Tobacco Use   Smoking status: Former    Packs/day: 0.25    Years: 45.00    Total pack years: 11.25    Types: Cigarettes    Quit date: 06/10/2016    Years since quitting: 5.7   Smokeless tobacco: Never   Tobacco comments:    Occasionally tobacco use - 1-2 cig a week  Substance Use Topics  Alcohol use: Yes    Alcohol/week: 7.0 standard drinks of alcohol    Types: 7 Glasses of wine per week    Comment: glass of wine 3-4 days per wek    CODE STATUS: DNR, two copies left in the home ADVANCED DIRECTIVES: Yes MOST FORM COMPLETE: No HOSPICE EDUCATION PROVIDED: No  Duration of visit and documentation: 60 minutes  Teddi Badalamenti, LCSW

## 2022-03-17 DIAGNOSIS — R197 Diarrhea, unspecified: Secondary | ICD-10-CM | POA: Diagnosis not present

## 2022-03-17 DIAGNOSIS — I051 Rheumatic mitral insufficiency: Secondary | ICD-10-CM | POA: Diagnosis not present

## 2022-03-17 DIAGNOSIS — C569 Malignant neoplasm of unspecified ovary: Secondary | ICD-10-CM | POA: Diagnosis not present

## 2022-03-17 DIAGNOSIS — R634 Abnormal weight loss: Secondary | ICD-10-CM | POA: Diagnosis not present

## 2022-03-17 DIAGNOSIS — I2510 Atherosclerotic heart disease of native coronary artery without angina pectoris: Secondary | ICD-10-CM | POA: Diagnosis not present

## 2022-03-17 DIAGNOSIS — E785 Hyperlipidemia, unspecified: Secondary | ICD-10-CM | POA: Diagnosis not present

## 2022-03-17 DIAGNOSIS — H409 Unspecified glaucoma: Secondary | ICD-10-CM | POA: Diagnosis not present

## 2022-03-17 DIAGNOSIS — I716 Thoracoabdominal aortic aneurysm, without rupture, unspecified: Secondary | ICD-10-CM | POA: Diagnosis not present

## 2022-03-17 DIAGNOSIS — I69318 Other symptoms and signs involving cognitive functions following cerebral infarction: Secondary | ICD-10-CM | POA: Diagnosis not present

## 2022-03-17 DIAGNOSIS — I719 Aortic aneurysm of unspecified site, without rupture: Secondary | ICD-10-CM | POA: Diagnosis not present

## 2022-03-17 DIAGNOSIS — R109 Unspecified abdominal pain: Secondary | ICD-10-CM | POA: Diagnosis not present

## 2022-03-17 DIAGNOSIS — I10 Essential (primary) hypertension: Secondary | ICD-10-CM | POA: Diagnosis not present

## 2022-03-19 DIAGNOSIS — R109 Unspecified abdominal pain: Secondary | ICD-10-CM | POA: Diagnosis not present

## 2022-03-19 DIAGNOSIS — C569 Malignant neoplasm of unspecified ovary: Secondary | ICD-10-CM | POA: Diagnosis not present

## 2022-03-19 DIAGNOSIS — I69318 Other symptoms and signs involving cognitive functions following cerebral infarction: Secondary | ICD-10-CM | POA: Diagnosis not present

## 2022-03-19 DIAGNOSIS — R197 Diarrhea, unspecified: Secondary | ICD-10-CM | POA: Diagnosis not present

## 2022-03-19 DIAGNOSIS — R634 Abnormal weight loss: Secondary | ICD-10-CM | POA: Diagnosis not present

## 2022-03-19 DIAGNOSIS — I2510 Atherosclerotic heart disease of native coronary artery without angina pectoris: Secondary | ICD-10-CM | POA: Diagnosis not present

## 2022-03-24 ENCOUNTER — Emergency Department (HOSPITAL_COMMUNITY)

## 2022-03-24 ENCOUNTER — Other Ambulatory Visit: Payer: Self-pay

## 2022-03-24 ENCOUNTER — Encounter (HOSPITAL_COMMUNITY): Payer: Self-pay

## 2022-03-24 ENCOUNTER — Observation Stay (HOSPITAL_COMMUNITY)

## 2022-03-24 ENCOUNTER — Observation Stay (HOSPITAL_COMMUNITY)
Admission: EM | Admit: 2022-03-24 | Discharge: 2022-03-25 | Disposition: A | Discharge status: Home / Self Care | Attending: Internal Medicine | Admitting: Internal Medicine

## 2022-03-24 DIAGNOSIS — Z8543 Personal history of malignant neoplasm of ovary: Secondary | ICD-10-CM | POA: Insufficient documentation

## 2022-03-24 DIAGNOSIS — R569 Unspecified convulsions: Secondary | ICD-10-CM | POA: Diagnosis not present

## 2022-03-24 DIAGNOSIS — Z85828 Personal history of other malignant neoplasm of skin: Secondary | ICD-10-CM | POA: Diagnosis not present

## 2022-03-24 DIAGNOSIS — R197 Diarrhea, unspecified: Secondary | ICD-10-CM | POA: Diagnosis not present

## 2022-03-24 DIAGNOSIS — C569 Malignant neoplasm of unspecified ovary: Secondary | ICD-10-CM | POA: Diagnosis present

## 2022-03-24 DIAGNOSIS — I7121 Aneurysm of the ascending aorta, without rupture: Secondary | ICD-10-CM | POA: Diagnosis not present

## 2022-03-24 DIAGNOSIS — Z7982 Long term (current) use of aspirin: Secondary | ICD-10-CM | POA: Insufficient documentation

## 2022-03-24 DIAGNOSIS — R634 Abnormal weight loss: Secondary | ICD-10-CM | POA: Diagnosis not present

## 2022-03-24 DIAGNOSIS — I639 Cerebral infarction, unspecified: Secondary | ICD-10-CM | POA: Diagnosis not present

## 2022-03-24 DIAGNOSIS — R200 Anesthesia of skin: Secondary | ICD-10-CM | POA: Diagnosis not present

## 2022-03-24 DIAGNOSIS — I6522 Occlusion and stenosis of left carotid artery: Secondary | ICD-10-CM | POA: Diagnosis not present

## 2022-03-24 DIAGNOSIS — I340 Nonrheumatic mitral (valve) insufficiency: Secondary | ICD-10-CM | POA: Diagnosis not present

## 2022-03-24 DIAGNOSIS — I10 Essential (primary) hypertension: Secondary | ICD-10-CM | POA: Diagnosis not present

## 2022-03-24 DIAGNOSIS — R531 Weakness: Secondary | ICD-10-CM | POA: Diagnosis not present

## 2022-03-24 DIAGNOSIS — D75839 Thrombocytosis, unspecified: Secondary | ICD-10-CM | POA: Insufficient documentation

## 2022-03-24 DIAGNOSIS — I2510 Atherosclerotic heart disease of native coronary artery without angina pectoris: Secondary | ICD-10-CM | POA: Diagnosis not present

## 2022-03-24 DIAGNOSIS — I672 Cerebral atherosclerosis: Secondary | ICD-10-CM | POA: Insufficient documentation

## 2022-03-24 DIAGNOSIS — I712 Thoracic aortic aneurysm, without rupture, unspecified: Secondary | ICD-10-CM | POA: Diagnosis present

## 2022-03-24 DIAGNOSIS — Z8673 Personal history of transient ischemic attack (TIA), and cerebral infarction without residual deficits: Secondary | ICD-10-CM | POA: Insufficient documentation

## 2022-03-24 DIAGNOSIS — I7123 Aneurysm of the descending thoracic aorta, without rupture: Secondary | ICD-10-CM | POA: Diagnosis not present

## 2022-03-24 DIAGNOSIS — R109 Unspecified abdominal pain: Secondary | ICD-10-CM | POA: Diagnosis not present

## 2022-03-24 DIAGNOSIS — I69318 Other symptoms and signs involving cognitive functions following cerebral infarction: Secondary | ICD-10-CM | POA: Diagnosis not present

## 2022-03-24 DIAGNOSIS — Z87891 Personal history of nicotine dependence: Secondary | ICD-10-CM | POA: Insufficient documentation

## 2022-03-24 DIAGNOSIS — I771 Stricture of artery: Secondary | ICD-10-CM | POA: Diagnosis not present

## 2022-03-24 DIAGNOSIS — E782 Mixed hyperlipidemia: Secondary | ICD-10-CM | POA: Diagnosis present

## 2022-03-24 DIAGNOSIS — Z79899 Other long term (current) drug therapy: Secondary | ICD-10-CM | POA: Insufficient documentation

## 2022-03-24 DIAGNOSIS — R4781 Slurred speech: Secondary | ICD-10-CM | POA: Insufficient documentation

## 2022-03-24 DIAGNOSIS — I720 Aneurysm of carotid artery: Secondary | ICD-10-CM | POA: Diagnosis not present

## 2022-03-24 DIAGNOSIS — Z7409 Other reduced mobility: Secondary | ICD-10-CM | POA: Diagnosis not present

## 2022-03-24 LAB — COMPREHENSIVE METABOLIC PANEL
ALT: 12 U/L (ref 0–44)
AST: 27 U/L (ref 15–41)
Albumin: 3 g/dL — ABNORMAL LOW (ref 3.5–5.0)
Alkaline Phosphatase: 73 U/L (ref 38–126)
Anion gap: 10 (ref 5–15)
BUN: 7 mg/dL — ABNORMAL LOW (ref 8–23)
CO2: 26 mmol/L (ref 22–32)
Calcium: 8.7 mg/dL — ABNORMAL LOW (ref 8.9–10.3)
Chloride: 104 mmol/L (ref 98–111)
Creatinine, Ser: 0.71 mg/dL (ref 0.44–1.00)
GFR, Estimated: >60 mL/min (ref >60)
Glucose, Bld: 91 mg/dL (ref 70–99)
Potassium: 4.2 mmol/L (ref 3.5–5.1)
Sodium: 140 mmol/L (ref 135–145)
Total Bilirubin: 0.7 mg/dL (ref 0.3–1.2)
Total Protein: 6.3 g/dL — ABNORMAL LOW (ref 6.5–8.1)

## 2022-03-24 LAB — CBC
HCT: 39.6 % (ref 36.0–46.0)
Hemoglobin: 12.8 g/dL (ref 12.0–15.0)
MCH: 29.8 pg (ref 26.0–34.0)
MCHC: 32.3 g/dL (ref 30.0–36.0)
MCV: 92.3 fL (ref 80.0–100.0)
Platelets: 481 10*3/uL — ABNORMAL HIGH (ref 150–400)
RBC: 4.29 MIL/uL (ref 3.87–5.11)
RDW: 15.1 % (ref 11.5–15.5)
WBC: 8.9 10*3/uL (ref 4.0–10.5)
nRBC: 0 % (ref 0.0–0.2)

## 2022-03-24 LAB — URINALYSIS, ROUTINE W REFLEX MICROSCOPIC
Bilirubin Urine: NEGATIVE
Glucose, UA: NEGATIVE mg/dL
Hgb urine dipstick: NEGATIVE
Ketones, ur: 20 mg/dL — AB
Leukocytes,Ua: NEGATIVE
Nitrite: NEGATIVE
Protein, ur: 30 mg/dL — AB
Specific Gravity, Urine: >1.046 — ABNORMAL HIGH (ref 1.005–1.030)
pH: 6 (ref 5.0–8.0)

## 2022-03-24 LAB — DIFFERENTIAL
Abs Immature Granulocytes: 0.03 10*3/uL (ref 0.00–0.07)
Basophils Absolute: 0.1 10*3/uL (ref 0.0–0.1)
Basophils Relative: 1 %
Eosinophils Absolute: 0.3 10*3/uL (ref 0.0–0.5)
Eosinophils Relative: 4 %
Immature Granulocytes: 0 %
Lymphocytes Relative: 19 %
Lymphs Abs: 1.7 10*3/uL (ref 0.7–4.0)
Monocytes Absolute: 0.7 10*3/uL (ref 0.1–1.0)
Monocytes Relative: 7 %
Neutro Abs: 6.1 10*3/uL (ref 1.7–7.7)
Neutrophils Relative %: 69 %

## 2022-03-24 LAB — I-STAT CHEM 8, ED
BUN: 8 mg/dL (ref 8–23)
Calcium, Ion: 1.06 mmol/L — ABNORMAL LOW (ref 1.15–1.40)
Chloride: 103 mmol/L (ref 98–111)
Creatinine, Ser: 0.6 mg/dL (ref 0.44–1.00)
Glucose, Bld: 95 mg/dL (ref 70–99)
HCT: 40 % (ref 36.0–46.0)
Hemoglobin: 13.6 g/dL (ref 12.0–15.0)
Potassium: 5 mmol/L (ref 3.5–5.1)
Sodium: 139 mmol/L (ref 135–145)
TCO2: 28 mmol/L (ref 22–32)

## 2022-03-24 LAB — PROTIME-INR
INR: 1.2 (ref 0.8–1.2)
Prothrombin Time: 15.4 seconds — ABNORMAL HIGH (ref 11.4–15.2)

## 2022-03-24 LAB — APTT: aPTT: 37 seconds — ABNORMAL HIGH (ref 24–36)

## 2022-03-24 LAB — CBG MONITORING, ED: Glucose-Capillary: 102 mg/dL — ABNORMAL HIGH (ref 70–99)

## 2022-03-24 LAB — AMMONIA: Ammonia: 21 umol/L (ref 9–35)

## 2022-03-24 LAB — ETHANOL: Alcohol, Ethyl (B): <10 mg/dL (ref <10)

## 2022-03-24 MED ORDER — LEVETIRACETAM IN NACL 1500 MG/100ML IV SOLN
1500.0000 mg | Freq: Once | INTRAVENOUS | Status: AC
  Administered 2022-03-24: 1500 mg via INTRAVENOUS
  Filled 2022-03-24 (×2): qty 100

## 2022-03-24 MED ORDER — ENOXAPARIN SODIUM 40 MG/0.4ML IJ SOSY
40.0000 mg | PREFILLED_SYRINGE | INTRAMUSCULAR | Status: DC
  Administered 2022-03-24: 40 mg via SUBCUTANEOUS
  Filled 2022-03-24: qty 0.4

## 2022-03-24 MED ORDER — LEVETIRACETAM IN NACL 500 MG/100ML IV SOLN
500.0000 mg | Freq: Two times a day (BID) | INTRAVENOUS | Status: DC
  Administered 2022-03-25: 500 mg via INTRAVENOUS
  Filled 2022-03-24: qty 100

## 2022-03-24 MED ORDER — GADOBUTROL 1 MMOL/ML IV SOLN
7.0000 mL | Freq: Once | INTRAVENOUS | Status: AC | PRN
  Administered 2022-03-24: 7 mL via INTRAVENOUS

## 2022-03-24 MED ORDER — LORAZEPAM 2 MG/ML IJ SOLN
1.0000 mg | INTRAMUSCULAR | Status: DC | PRN
  Administered 2022-03-24: 1 mg via INTRAVENOUS
  Filled 2022-03-24: qty 1

## 2022-03-24 MED ORDER — ACETAMINOPHEN 160 MG/5ML PO SOLN: 650.0000 mg | ORAL | Status: DC | PRN

## 2022-03-24 MED ORDER — ASPIRIN 325 MG PO TABS
325.0000 mg | ORAL_TABLET | Freq: Every day | ORAL | Status: DC
  Administered 2022-03-24: 325 mg via ORAL
  Filled 2022-03-24 (×2): qty 1

## 2022-03-24 MED ORDER — TIMOLOL MALEATE 0.5 % OP SOLN
1.0000 [drp] | Freq: Two times a day (BID) | OPHTHALMIC | Status: DC
  Administered 2022-03-24 – 2022-03-25 (×2): 1 [drp] via OPHTHALMIC
  Filled 2022-03-24: qty 5

## 2022-03-24 MED ORDER — PRAVASTATIN SODIUM 10 MG PO TABS
10.0000 mg | ORAL_TABLET | Freq: Every day | ORAL | Status: DC
  Filled 2022-03-24: qty 1

## 2022-03-24 MED ORDER — SENNOSIDES-DOCUSATE SODIUM 8.6-50 MG PO TABS: 1.0000 | ORAL_TABLET | Freq: Every evening | ORAL | Status: DC | PRN

## 2022-03-24 MED ORDER — STROKE: EARLY STAGES OF RECOVERY BOOK
Freq: Once | Status: AC
  Administered 2022-03-25: 1
  Filled 2022-03-24: qty 1

## 2022-03-24 MED ORDER — ACETAMINOPHEN 325 MG PO TABS: 650.0000 mg | ORAL_TABLET | ORAL | Status: DC | PRN

## 2022-03-24 MED ORDER — LATANOPROST 0.005 % OP SOLN
1.0000 [drp] | Freq: Every day | OPHTHALMIC | Status: DC
  Administered 2022-03-24: 1 [drp] via OPHTHALMIC
  Filled 2022-03-24: qty 2.5

## 2022-03-24 MED ORDER — IOHEXOL 350 MG/ML SOLN
75.0000 mL | Freq: Once | INTRAVENOUS | Status: AC | PRN
  Administered 2022-03-24: 75 mL via INTRAVENOUS

## 2022-03-24 MED ORDER — DOXYLAMINE SUCCINATE (SLEEP) 25 MG PO TABS
50.0000 mg | ORAL_TABLET | Freq: Every day | ORAL | Status: DC
  Administered 2022-03-24: 50 mg via ORAL
  Filled 2022-03-24 (×3): qty 2

## 2022-03-24 MED ORDER — ACETAMINOPHEN 650 MG RE SUPP: 650.0000 mg | RECTAL | Status: DC | PRN

## 2022-03-24 NOTE — ED Notes (Addendum)
Patient transported to XR. 

## 2022-03-24 NOTE — ED Notes (Signed)
Patient went to bathroom and missed urine cup.

## 2022-03-24 NOTE — Assessment & Plan Note (Signed)
Follows with oncology, Dr. Alvy Bimler.  Did not tolerate chemotherapy.  Active with home hospice.

## 2022-03-24 NOTE — Assessment & Plan Note (Signed)
MRI brain shows small acute infarct in the left occipital lobe.  No mass lesion. -Neurology following -CTA head negative for LVO or proximal high-grade arterial stenosis -CTA neck with severe stenosis mid-left subclavian artery, 60-70% stenosis proximal left ICA -Continue aspirin 325 mg -Keep on telemetry, continue neurochecks -PT/OT/SLP eval -Allow permissive hypertension for now

## 2022-03-24 NOTE — Assessment & Plan Note (Signed)
Continue statin. 

## 2022-03-24 NOTE — Assessment & Plan Note (Signed)
Continuous rhythmic jerking motion of the right hand.  Neurology following for focal seizure activity versus myoclonic jerks. -Follow EEG -S/p IV Keppra 1500 mg load, continue 500 mg BID per neuro -seizure precautions

## 2022-03-24 NOTE — ED Notes (Addendum)
Patient transported to MRI 

## 2022-03-24 NOTE — ED Provider Notes (Signed)
Care of patient assumed from Dr. Armandina Gemma at 4 PM.  Patient arrives for new onset right upper extremity weakness and tremors.  Case has been discussed with neurology.  MRI studies are ordered.  Following MRI results, reengage with neurology. Physical Exam  BP 135/77   Pulse 68   Temp 97.7 F (36.5 C) (Oral)   Resp 18   Ht 5' 6.5" (1.689 m)   Wt 71.2 kg   SpO2 99%   BMI 24.96 kg/m   Physical Exam Vitals and nursing note reviewed.  Constitutional:      General: She is not in acute distress.    Appearance: Normal appearance. She is well-developed. She is ill-appearing (Chronically). She is not toxic-appearing or diaphoretic.  HENT:     Head: Normocephalic and atraumatic.     Right Ear: External ear normal.     Left Ear: External ear normal.     Nose: Nose normal.     Mouth/Throat:     Mouth: Mucous membranes are moist.  Eyes:     Extraocular Movements: Extraocular movements intact.     Conjunctiva/sclera: Conjunctivae normal.  Cardiovascular:     Rate and Rhythm: Normal rate and regular rhythm.  Pulmonary:     Effort: Pulmonary effort is normal. No respiratory distress.  Abdominal:     General: There is no distension.     Palpations: Abdomen is soft.  Musculoskeletal:        General: No swelling or deformity.     Cervical back: Normal range of motion and neck supple.  Skin:    General: Skin is warm and dry.  Neurological:     Mental Status: She is alert and oriented to person, place, and time.     Cranial Nerves: Dysarthria (Intermittent, mild word salad) present. No cranial nerve deficit or facial asymmetry.     Sensory: Sensation is intact. No sensory deficit.     Motor: Weakness (RUE), tremor (Right hand) and pronator drift present.     Coordination: Coordination is intact.  Psychiatric:        Mood and Affect: Mood normal.        Behavior: Behavior normal.        Thought Content: Thought content normal.        Judgment: Judgment normal.     Procedures   Procedures  ED Course / MDM    Medical Decision Making Amount and/or Complexity of Data Reviewed Labs: ordered. Radiology: ordered.  Risk Prescription drug management. Decision regarding hospitalization.   Patient underwent MRI imaging which did show a small acute left occipital lobe infarct.  On exam, patient has continued mild weakness to right upper extremity and continued tremor of her right hand.  Unfortunately, she was in the hallway and unable to undergo EEG yet.  I spoke with neurology who recommends admission.  Patient was admitted to hospitalist for further management.       Godfrey Pick, MD 03/24/22 2204

## 2022-03-24 NOTE — Assessment & Plan Note (Signed)
Stable appearance on CT.

## 2022-03-24 NOTE — Progress Notes (Signed)
[facility] Hospital Liaison Note   This is a current [facility] hospice patient. Please call with any hospice questions or concerns. Thank you.   Sarah L. Martin, RN [facility] Hospital Liaison [phone] 

## 2022-03-24 NOTE — ED Notes (Signed)
Pt returned from MRI °

## 2022-03-24 NOTE — Assessment & Plan Note (Signed)
BP stable.  Holding antihypertensives.

## 2022-03-24 NOTE — ED Provider Notes (Signed)
[facility] EMERGENCY DEPARTMENT Provider Note   CSN: [id number] Arrival date & time: 03/24/22  1021     History  Chief Complaint  Patient presents with   Extremity Weakness    Regina James is a 88 y.o. female.  HPI   88 year old female with medical history significant for SVT, CVA with right-sided arm weakness, HTN, thoracic aortic aneurysm, penetrating ulcer of the aorta, CAD, HLD, right-sided ovarian epithelial cancer who presents to the emergency department with right arm weakness.  The patient states that she was last normal from what she can member at 26 PM yesterday.  As of 5 PM yesterday, she noticed that she was having significant difficulty using a knife to cut and open a package of bacon.  She continues to endorse worsening grip strength and weakness and numbness in the right arm.  She describes her right arm as "an appendage that I did not know was there."  Is a significant worsening of her baseline right extremity weakness.  Denies any facial droop, facial numbness or tingling, dysarthria or dysphagia.  She denies any leg weakness.  She had persistent symptoms when she woke up this morning after going to bed and presents to the emergency department today for evaluation. She also states that her right hand began to have rhythmic jerking that has yet to stop.  Home Medications Prior to Admission medications   Medication Sig Start Date End Date Taking? Authorizing Provider  aspirin 325 MG tablet Take 1 tablet (325 mg total) by mouth daily. 11/10/16   Alvia Grove, PA-C  cholecalciferol (VITAMIN D3) 25 MCG (1000 UNIT) tablet Take 1,000 Units by mouth daily.    [provider]  doxylamine, Sleep, (SLEEP AID) 25 MG tablet Take 50 mg by mouth at bedtime.    [provider]  latanoprost (XALATAN) 0.005 % ophthalmic solution Place 1 drop into the left eye at bedtime. 10/04/21   [provider]  metoprolol succinate (TOPROL-XL) 100 MG  24 hr tablet Take 0.5 tablets (50 mg total) by mouth daily. 11/28/21   Geradine Girt, DO  Multiple Vitamins-Minerals (PRESERVISION/LUTEIN PO) Take by mouth.    [provider]  Polyethyl Glycol-Propyl Glycol (SYSTANE) 0.4-0.3 % SOLN Place 1 drop into both eyes daily as needed for dry eyes. prn    [provider]  pravastatin (PRAVACHOL) 20 MG tablet TAKE 1 TABLET AT 6PM. Patient taking differently: Take 20 mg by mouth daily. Per daughter 1/2 tablet daily 12/05/19   Pixie Casino, MD  timolol (TIMOPTIC) 0.5 % ophthalmic solution Place 1 drop into the left eye 2 (two) times daily. 01/07/20   [provider]  prochlorperazine (COMPAZINE) 10 MG tablet Take 1 tablet (10 mg total) by mouth every 6 (six) hours as needed (Nausea or vomiting). 11/11/21 11/23/21  [name], MD      Allergies    Losartan, Dorzolamide hcl-timolol mal, Morphine and related, and Levofloxacin    Review of Systems   Review of Systems  All other systems reviewed and are negative.   Physical Exam Updated Vital Signs BP 138/73 (BP Location: Left Arm)   Pulse 64   Temp 97.7 F (36.5 C) (Oral)   Resp 20   Ht 5' 6.5" (1.689 m)   Wt 71.2 kg   SpO2 100%   BMI 24.96 kg/m  Physical Exam Vitals and nursing note reviewed.  Constitutional:      General: She is not in acute distress.  Appearance: She is well-developed.  HENT:     Head: Normocephalic and atraumatic.  Eyes:     Conjunctiva/sclera: Conjunctivae normal.  Cardiovascular:     Rate and Rhythm: Normal rate and regular rhythm.  Pulmonary:     Effort: Pulmonary effort is normal. No respiratory distress.     Breath sounds: Normal breath sounds.  Abdominal:     Palpations: Abdomen is soft.     Tenderness: There is no abdominal tenderness.  Musculoskeletal:        General: No swelling.     Cervical back: Neck supple.  Skin:    General: Skin is warm and dry.     Capillary Refill: Capillary refill takes less than 2 seconds.   Neurological:     Mental Status: She is alert.     Comments: MENTAL STATUS EXAM:    Orientation: Alert and oriented to person, place and time.  Memory: Cooperative, follows commands well.  Language: Speech is clear and language is normal.   CRANIAL NERVES:    CN 2 (Optic): Visual fields intact to confrontation.  CN 3,4,6 (EOM): Pupils equal and reactive to light. Full extraocular eye movement without nystagmus.  CN 5 (Trigeminal): Facial sensation is normal, no weakness of masticatory muscles.  CN 7 (Facial): No facial weakness or asymmetry.  CN 8 (Auditory): Auditory acuity grossly normal.  CN 9,10 (Glossophar): The uvula is midline, the palate elevates symmetrically.  CN 11 (spinal access): Normal sternocleidomastoid and trapezius strength.  CN 12 (Hypoglossal): The tongue is midline. No atrophy or fasciculations.Marland Kitchen   MOTOR:  Muscle Strength: 4/5RUE, 5/5LUE, 5/5RLE, 5/5LLE.   COORDINATION:   Intact finger-to-nose, Tremor present in the RUE.   SENSATION:   Intact to light touch all four extremities, diminished in the RUE.    Psychiatric:        Mood and Affect: Mood normal.     ED Results / Procedures / Treatments   Labs (all labs ordered are listed, but only abnormal results are displayed) Labs Reviewed  PROTIME-INR - Abnormal; Notable for the following components:      Result Value   Prothrombin Time 15.4 (*)    All other components within normal limits  APTT - Abnormal; Notable for the following components:   aPTT 37 (*)    All other components within normal limits  CBC - Abnormal; Notable for the following components:   Platelets 481 (*)    All other components within normal limits  COMPREHENSIVE METABOLIC PANEL - Abnormal; Notable for the following components:   BUN 7 (*)    Calcium 8.7 (*)    Total Protein 6.3 (*)    Albumin 3.0 (*)    All other components within normal limits  I-STAT CHEM 8, ED - Abnormal; Notable for the following components:   Calcium, Ion  1.06 (*)    All other components within normal limits  CBG MONITORING, ED - Abnormal; Notable for the following components:   Glucose-Capillary 102 (*)    All other components within normal limits  ETHANOL  DIFFERENTIAL  RAPID URINE DRUG SCREEN, HOSP PERFORMED  URINALYSIS, ROUTINE W REFLEX MICROSCOPIC  AMMONIA    EKG EKG Interpretation  Date/Time:  Wednesday March 24 2022 11:00:55 EST Ventricular Rate:  61 PR Interval:  174 QRS Duration: 72 QT Interval:  444 QTC Calculation: 446 R Axis:   -27 Text Interpretation: Normal sinus rhythm Minimal voltage criteria for LVH, may be normal variant ( R in aVL ) Anterior infarct , age  undetermined Abnormal ECG When compared with ECG of 24-Nov-2021 08:20, PREVIOUS ECG IS PRESENT Confirmed by Regan Lemming ([id number]) on 03/24/2022 12:17:36 PM  Radiology CT ANGIO HEAD NECK W WO CM  Result Date: 03/24/2022 CLINICAL DATA:  Provided history: Neuro deficit, acute, stroke suspected. Additional history provided: Intermittent numbness in right arm. Jerking and weakness in right arm. History of stroke. EXAM: CT ANGIOGRAPHY HEAD AND NECK TECHNIQUE: Multidetector CT imaging of the head and neck was performed using the standard protocol during bolus administration of intravenous contrast. Multiplanar CT image reconstructions and MIPs were obtained to evaluate the vascular anatomy. Carotid stenosis measurements (when applicable) are obtained utilizing NASCET criteria, using the distal internal carotid diameter as the denominator. RADIATION DOSE REDUCTION: This exam was performed according to the departmental dose-optimization program which includes automated exposure control, adjustment of the mA and/or kV according to patient size and/or use of iterative reconstruction technique. CONTRAST:  37m OMNIPAQUE IOHEXOL 350 MG/ML SOLN COMPARISON:  Prior head CT examinations 11/23/2021 and earlier. Brain MRI 11/04/2016. CT angiogram head/neck 11/04/2016. Chest CT  05/19/2020. FINDINGS: CT HEAD FINDINGS Brain: Mild generalized cerebral atrophy. Multiple known small chronic cortically-based infarcts within the left frontal, parietal and occipital lobes, many of which were better appreciated on the prior brain MRI of 11/04/2016 (acute at that time). Background mild-to-moderate patchy and ill-defined hypoattenuation within the cerebral white matter nonspecific but compatible with chronic small vessel image disease. There is no acute intracranial hemorrhage. No acute demarcated cortical infarct is identified. No extra-axial fluid collection. No evidence of an intracranial mass. No midline shift. Vascular: No hyperdense vessel.  Atherosclerotic calcifications. Skull: No fracture or aggressive osseous lesion. Sinuses/Orbits: No mass or acute finding within the imaged orbits. Minimal mucosal thickening and fluid scattered within the bilateral ethmoid air cells. Trace mucosal thickening within the bilateral sphenoid sinuses. Review of the MIP images confirms the above findings CTA NECK FINDINGS Aortic arch: Incompletely imaged ascending thoracic aortic aneurysm, not appreciably changed at the imaged levels as compared to the prior chest CT of 05/19/2020. 15 x 6 mm penetrating atherosclerotic ulcer along the left aspect of the aortic arch, somewhat changed in morphology but not significantly changed in size as compared to the prior CTA head/neck of 11/03/2016. Atherosclerotic plaque within the visualized aortic arch and proximal major branch vessels of the neck. Redemonstrated high-grade stenosis within the mid left subclavian artery (of at least 80%). Streak and beam hardening artifact arising from a dense right-sided contrast bolus partially obscures the right subclavian artery. Within this limitation, there is no appreciable hemodynamically significant stenosis within the innominate or right subclavian arteries. Right carotid system: CCA and ICA patent within the neck without  hemodynamically significant stenosis (50% or greater). Some soft plaque is present within the mid to distal CCA, about the carotid bifurcation and within the proximal ICA. Left carotid system: Prior left carotid endarterectomy. Soft plaque within the mid to distal CCA and within the proximal ICA and ECA. New from the prior examination of 11/03/2016, there is a 60-70% stenosis within the proximal ICA. Vertebral arteries: Vertebral arteries patent within the neck without significant stenosis. The left vertebral artery is dominant. Skeleton: Cervical spondylosis. No acute fracture or aggressive osseous lesion. Other neck: No neck mass or cervical lymphadenopathy. Upper chest: No consolidation within the imaged lung apices. Review of the MIP images confirms the above findings CTA HEAD FINDINGS Anterior circulation: The intracranial internal carotid arteries are patent. Atherosclerotic plaque within both vessels with no more than mild stenosis. The  M1 middle cerebral arteries are patent. Atherosclerotic irregularity of the M2 and more distal MCA vessels bilaterally. No M2 proximal branch occlusion or high-grade proximal stenosis. The anterior cerebral arteries are patent. No intracranial aneurysm is identified. Posterior circulation: The non dominant right vertebral artery is developmentally diminutive intracranially and terminates predominantly as the right PICA. The dominant intracranial left vertebral artery is patent without stenosis and supplies the basilar artery. The basilar artery is patent. The posterior cerebral arteries are patent. A right posterior communicating artery is present. The left posterior communicating artery is diminutive or absent. Venous sinuses: Within the limitations of contrast timing, no convincing thrombus. Anatomic variants: As described. Review of the MIP images confirms the above findings IMPRESSION: CT head: 1. No evidence of acute intracranial abnormality. 2. Known small chronic  cortically-based infarcts within the left frontal, parietal and occipital lobes. 3. Background mild-to-moderate chronic small ischemic changes within the cerebral white matter. 4. Minimal bilateral ethmoid sinus disease. CTA neck: 1. Incompletely imaged ascending thoracic aortic aneurysm. The aneurysm has not changed in size at the imaged levels as compared to the prior chest CT of 05/19/2020. 2. 15 x 6 mm penetrating atherosclerotic ulcer along the left aspect of the aortic arch. This penetrating atherosclerotic ulcer has changed in morphology since the prior CTA of 11/04/2016, but has not significantly changed in size. 3. Redemonstrated severe stenosis within the mid left subclavian artery (at least 80%). 4. Sequelae of prior left carotid endarterectomy. New from the prior CTA of 11/04/2016, there is a 60-70% atherosclerotic stenosis within the proximal left ICA. 5. The right common carotid, right internal carotid and bilateral vertebral arteries are patent within the neck without hemodynamically significant stenosis. 6.  Aortic Atherosclerosis (ICD10-I70.0). CTA head: Intracranial atherosclerotic disease, as described. No intracranial large vessel occlusion or proximal high-grade arterial stenosis is identified. Electronically Signed   By: Kellie Simmering D.O.   On: 03/24/2022 13:48    Procedures Procedures    Medications Ordered in ED Medications  LORazepam (ATIVAN) injection 1 mg (has no administration in time range)  iohexol (OMNIPAQUE) 350 MG/ML injection 75 mL (75 mLs Intravenous Contrast Given 03/24/22 1245)  levETIRAcetam (KEPPRA) IVPB 1500 mg/ 100 mL premix (0 mg Intravenous Stopped 03/24/22 1606)    ED Course/ Medical Decision Making/ A&P                           Medical Decision Making Amount and/or Complexity of Data Reviewed Labs: ordered. Radiology: ordered.  Risk Prescription drug management.    88 year old female with medical history significant for SVT, CVA with right-sided  arm weakness, HTN, thoracic aortic aneurysm, penetrating ulcer of the aorta, CAD, HLD, right-sided ovarian epithelial cancer who presents to the emergency department with right arm weakness.  The patient states that she was last normal from what she can member at 61 PM yesterday.  As of 5 PM yesterday, she noticed that she was having significant difficulty using a knife to cut and open a package of bacon.  She continues to endorse worsening grip strength and weakness and numbness in the right arm.  She describes her right arm as "an appendage that I did not know was there."  Is a significant worsening of her baseline right extremity weakness.  Denies any facial droop, facial numbness or tingling, dysarthria or dysphagia.  She denies any leg weakness.  She had persistent symptoms when she woke up this morning after going to bed and presents to  the emergency department today for evaluation. She also states that her right hand began to have rhythmic jerking that has yet to stop.  On arrival, the patient was vitally stable.  Physical exam concerning for right arm rhythmic myoclonic jerking, weakness 4/5 in the right upper extremity otherwise neurologically intact.  Differential diagnosis includes stroke recrudescence, partial seizure, new onset CVA, metastatic disease to the brain.  CT angio of the head and neck was performed and revealed the following: IMPRESSION:  CT head:    1. No evidence of acute intracranial abnormality.  2. Known small chronic cortically-based infarcts within the left  frontal, parietal and occipital lobes.  3. Background mild-to-moderate chronic small ischemic changes within  the cerebral white matter.  4. Minimal bilateral ethmoid sinus disease.    CTA neck:    1. Incompletely imaged ascending thoracic aortic aneurysm. The  aneurysm has not changed in size at the imaged levels as compared to  the prior chest CT of 05/19/2020.  2. 15 x 6 mm penetrating atherosclerotic ulcer  along the left aspect  of the aortic arch. This penetrating atherosclerotic ulcer has  changed in morphology since the prior CTA of 11/04/2016, but has not  significantly changed in size.  3. Redemonstrated severe stenosis within the mid left subclavian  artery (at least 80%).  4. Sequelae of prior left carotid endarterectomy. New from the prior  CTA of 11/04/2016, there is a 60-70% atherosclerotic stenosis within  the proximal left ICA.  5. The right common carotid, right internal carotid and bilateral  vertebral arteries are patent within the neck without  hemodynamically significant stenosis.  6.  Aortic Atherosclerosis (ICD10-I70.0).    CTA head:    Intracranial atherosclerotic disease, as described. No intracranial  large vessel occlusion or proximal high-grade arterial stenosis is  identified.    Laboratory evaluation significant for CBG 102 on arrival, CMP generally unremarkable, CBC with thrombocytosis to 41 otherwise unremarkable, ethanol level normal.  Dr. Lorrin Goodell of neurology was consulted who recommended MRI imaging of the brain and EEG monitoring.  Disposition pending results of testing.  Primary concern for focal seizures, less likely myoclonic jerks.  Per neurology "given her history of ovarian cancer and inability to tolerate chemo and thus on a outpatient hospice, suspect that she could have a small brain or skull met impinging on the brain.  Could also be paraneoplastic in etiology."  Patient was loaded with 1500 mg of IV Keppra with recommendations to include 500 mg twice daily subsequently.  EEG monitoring was obtained.  Urinalysis and checks x-ray ordered.  Patient pending the results of EEG monitoring, MRI of the brain, chest x-ray.  Disposition pending reassessment.  Signout given to Dr. Doren Custard at [phone].   Final Clinical Impression(s) / ED Diagnoses Final diagnoses:  Weakness    Rx / DC Orders ED Discharge Orders     None         Regan Lemming,  MD 03/24/22 [phone]

## 2022-03-24 NOTE — H&P (Signed)
History and Physical    Regina James EGB:[id number] DOB: 20-Apr-1933 DOA: 03/24/2022  PCP: Kathyrn Lass, MD  Patient coming from: Home  I have personally briefly reviewed patient's old medical records in [place]  Chief Complaint: Right arm weakness  HPI: Regina James is a 88 y.o. female with medical history significant for stage III right ovarian cancer, history of CVA, ascending thoracic aortic aneurysm, penetrating ulcer of aorta, carotid artery disease s/p left CEA, HTN, HLD who presented to the ED for evaluation of right arm weakness.  Patient reports new onset of right arm weakness beginning last night (03/23/2022).  This happened while she was cooking dinner.  This morning she awoke with increased weakness of the right arm and new continuous jerking movement of her right hand.  Family reported she had a brief period of slurred speech which has since resolved.  ED Course  Labs/Imaging on admission: I have personally reviewed following labs and imaging studies.  Initial vitals showed BP 131/79, pulse 66, RR 18, temp 98.0 F, SpO2 96% on room air.  Labs show sodium 140, potassium 4.2, bicarb 26, BUN 7, creatinine 0.71, serum glucose 91, WBC 8.9, hemoglobin 12.8, platelets 481,000.  Portable chest x-ray with mild bibasilar opacities likely due to atelectasis.  CT head negative for evidence of acute endocrine abnormality.  Known small chronic cortically based infarcts within the left frontal, parietal, and [place] lobes noted.  CTA neck showed incompletely imaged descending thoracic aortic aneurysm, not changed in size compared to prior.  15.6 mm penetrating atherosclerotic ulcer along the left aspect of the aortic arch again noted, not significantly changed in size.  Redemonstrated severe stenosis within the mid left subclavian artery (at least 80%) also seen.  Prior left CEA with new 60-70% atherosclerotic stenosis within the proximal left ICA noted.  Right common carotid,  internal carotid, and bilateral vertebral arteries are patent.  CTA head negative for intracranial LVO or proximal high-grade arterial stenosis.  MRI brain with and without contrast shows small acute infarct left supra lobe.  Chronic hemorrhagic infarcts in the left frontal lobe and left occipital parietal lobe.  Chronic microvascular ischemic change in the white matter.  No mass lesion.  Neurology consulted, recommend workup for potential seizure-like activity.  Patient was given IV Keppra 1500 mg and EEG ordered.  The hospitalist service was consulted to admit for further evaluation and management.  Review of Systems: All systems reviewed and are negative except as documented in history of present illness above.   Past Medical History:  Diagnosis Date   Atrial septal aneurysm    Cancer ([place])    basal cell carcinoma   Carotid artery disease ([place])    History of PSVT (paroxysmal supraventricular tachycardia)    Reported back as far as 2010.   Hyperlipidemia    Hypertension    Mitral regurgitation    Palpitations    Penetrating ulcer of aorta (HCC)    Right ovarian epithelial cancer (HCC)    Stroke ([place])    x 3 cannot write as quickly as used to   Thoracic aortic aneurysm [facility])     Past Surgical History:  Procedure Laterality Date   APPENDECTOMY  1993   CERVICAL BIOPSY  W/ LOOP ELECTRODE EXCISION     CHOLECYSTECTOMY  1996   ENDARTERECTOMY Left 11/03/2016   Procedure: Exploration of Carotid  ENDARTERECTOMY.;  Surgeon: Elam Dutch, MD;  [facility] OR;  Service: Vascular;  Laterality: Left;   ENDARTERECTOMY Left 11/03/2016  Procedure: ENDARTERECTOMY CAROTID;  Surgeon: Elam Dutch, MD;  Location: [facility];  Service: Vascular;  Laterality: Left;   IR IMAGING GUIDED PORT INSERTION  11/12/2021   LAPAROSCOPY N/A 11/03/2021   Procedure: DIAGNOSTIC LAPAROSCOPY WITH BIOPSY;  Surgeon: Lafonda Mosses, MD;  Location: [facility] ORS;  Service: Gynecology;  Laterality: N/A;   LOOP  RECORDER INSERTION N/A 11/02/2016   Procedure: Loop Recorder Insertion;  Surgeon: [name], MD;  Location: [facility] CV LAB;  Service: Cardiovascular;  Laterality: N/A;   ROTATOR CUFF REPAIR Right 1990   TEE WITHOUT CARDIOVERSION N/A 11/02/2016   Procedure: TRANSESOPHAGEAL ECHOCARDIOGRAM (TEE);  Surgeon: Lelon Perla, MD;  Location: [facility] ENDOSCOPY;  Service: Cardiovascular;  Laterality: N/A;   US ECHOCARDIOGRAPHY  01/09/2009   EF 55-60%   WRIST FRACTURE SURGERY      Social History:  reports that she quit smoking about 5 years ago. Her smoking use included cigarettes. She has a 11.25 pack-year smoking history. She has never used smokeless tobacco. She reports current alcohol use of about 7.0 standard drinks of alcohol per week. She reports that she does not use drugs.  Allergies  Allergen Reactions   Losartan Anaphylaxis    Per daughter and Patient, states that provider suggested she is having allergic reaction to medication   Dorzolamide Hcl-Timolol Mal Itching    Made vision very blurry and itching.Eyes very red.   Morphine And Related Swelling   Levofloxacin Anxiety and Other (See Comments)    Double vision    Family History  Problem Relation Age of Onset   Varicose Veins Mother    Cardiomyopathy Mother    Cancer Father        prostate   Diabetes Father    Kidney cancer Brother    Arrhythmia Brother    Colon cancer Neg Hx    Breast cancer Neg Hx    Ovarian cancer Neg Hx    Endometrial cancer Neg Hx    Pancreatic cancer Neg Hx      Prior to Admission medications   Medication Sig Start Date End Date Taking? Authorizing Provider  aspirin 325 MG tablet Take 1 tablet (325 mg total) by mouth daily. 11/10/16   Alvia Grove, PA-C  cholecalciferol (VITAMIN D3) 25 MCG (1000 UNIT) tablet Take 1,000 Units by mouth daily.    [provider]  doxylamine, Sleep, (SLEEP AID) 25 MG tablet Take 50 mg by mouth at bedtime.    [provider]  latanoprost  (XALATAN) 0.005 % ophthalmic solution Place 1 drop into the left eye at bedtime. 10/04/21   [provider]  metoprolol succinate (TOPROL-XL) 100 MG 24 hr tablet Take 0.5 tablets (50 mg total) by mouth daily. 11/28/21   Geradine Girt, DO  Multiple Vitamins-Minerals (PRESERVISION/LUTEIN PO) Take by mouth.    [provider]  Polyethyl Glycol-Propyl Glycol (SYSTANE) 0.4-0.3 % SOLN Place 1 drop into both eyes daily as needed for dry eyes. prn    [provider]  pravastatin (PRAVACHOL) 20 MG tablet TAKE 1 TABLET AT 6PM. Patient taking differently: Take 20 mg by mouth daily. Per daughter 1/2 tablet daily 12/05/19   Pixie Casino, MD  timolol (TIMOPTIC) 0.5 % ophthalmic solution Place 1 drop into the left eye 2 (two) times daily. 01/07/20   [provider]  prochlorperazine (COMPAZINE) 10 MG tablet Take 1 tablet (10 mg total) by mouth every 6 (six) hours as needed (Nausea or vomiting). 11/11/21 11/23/21  [name], MD  Physical Exam: Vitals:   03/24/22 1259 03/24/22 1455 03/24/22 1550 03/24/22 1901  BP: 136/75 138/73 135/77 109/70  Pulse: 66 64 68 80  Resp: '19 20 18 18  '$ Temp: 98.2 F (36.8 C) 97.7 F (36.5 C)  97.8 F (36.6 C)  TempSrc: Oral Oral  Oral  SpO2: 100% 100% 99% 100%  Weight:      Height:       Constitutional: Resting in bed, NAD, calm, comfortable Eyes: EOMI, lids and conjunctivae normal ENMT: Mucous membranes are moist. Posterior pharynx clear of any exudate or lesions.Normal dentition.  Neck: normal, supple, no masses. Respiratory: clear to auscultation bilaterally, no wheezing, no crackles. Normal respiratory effort. No accessory muscle use.  Cardiovascular: Regular rate and rhythm, no murmurs / rubs / gallops. No extremity edema. 2+ pedal pulses. Abdomen: no tenderness, no masses palpated. No hepatosplenomegaly. Bowel sounds positive.  Musculoskeletal: no clubbing / cyanosis. No joint deformity upper and lower extremities. Good ROM, no  contractures. Normal muscle tone.  Skin: no rashes, lesions, ulcers. No induration Neurologic: CN 2-12 grossly intact.  Redneck jerking motion right hand.  Sensation intact. Strength 4/5 RUE otherwise 5/5 in all other extremities. Psychiatric: Alert and oriented x 3. Normal mood.   EKG: Personally reviewed. Sinus rhythm, rate 61, no acute ischemic changes.  Similar to prior.  Assessment/Plan Principal Problem:   Acute CVA (cerebrovascular accident) [facility]) Active Problems:   Seizure-like activity ([place])   Malignant neoplasm of ovary ([place])   Mixed dyslipidemia   Essential hypertension   Thoracic aortic aneurysm without rupture ([place])   Regina James is a 88 y.o. female with medical history significant for stage III right ovarian cancer, history of CVA, ascending thoracic aortic aneurysm, penetrating ulcer of aorta, carotid artery disease s/p left CEA, HTN, HLD who is admitted with an acute infarct of the left occipital lobe with tremors of the right hand concerning for seizure-like activity.  Assessment and Plan: * Acute CVA (cerebrovascular accident) ([place]) MRI brain shows small acute infarct in the left occipital lobe.  No mass lesion. -Neurology following -CTA head negative for LVO or proximal high-grade arterial stenosis -CTA neck with severe stenosis mid-left subclavian artery, 60-70% stenosis proximal left ICA -Continue aspirin 325 mg -Keep on telemetry, continue neurochecks -PT/OT/SLP eval -Allow permissive hypertension for now  Seizure-like activity (HCC) Continuous rhythmic jerking motion of the right hand.  Neurology following for focal seizure activity versus myoclonic jerks. -Follow EEG -S/p IV Keppra 1500 mg load, continue 500 mg BID per neuro -seizure precautions  Malignant neoplasm of ovary ([place]) Follows with oncology, Dr. Alvy Bimler.  Did not tolerate chemotherapy.  Active with home hospice.  Thoracic aortic aneurysm without rupture (HCC) Stable appearance on  CT.  Essential hypertension BP stable.  Holding antihypertensives.  Mixed dyslipidemia Continue statin.  DVT prophylaxis: enoxaparin (LOVENOX) injection 40 mg Start: 03/24/22 2000 Code Status: DNR, confirmed with patient on admission Family Communication: Daughter and son-in-law at bedside Disposition Plan: From home and likely discharge to home pending clinical progress Consults called: Neurology Severity of Illness: The appropriate patient status for this patient is OBSERVATION. Observation status is judged to be reasonable and necessary in order to provide the required intensity of service to ensure the patient's safety. The patient's presenting symptoms, physical exam findings, and initial radiographic and laboratory data in the context of their medical condition is felt to place them at decreased risk for further clinical deterioration. Furthermore, it is anticipated that the patient will be medically stable for discharge  from the hospital within 2 midnights of admission.   Zada Finders MD [facility]  If 7PM-7AM, please contact night-coverage [email or web address]  03/24/2022, 8:02 PM

## 2022-03-24 NOTE — Consult Note (Addendum)
NEUROLOGY CONSULTATION NOTE   Date of service: March 24, 2022 Patient Name: Regina James MRN:  [id number] DOB:  1933/10/05 Reason for consult: "jerking of right arm" Requesting Provider: Regan Lemming, MD _ _ _   _ __   _ __ _ _  __ __   _ __   __ _  History of Present Illness  Regina James is a 88 y.o. female with PMH significant for  has a past medical history of Atrial septal aneurysm, Cancer ([place]), Carotid artery disease ([place]), History of PSVT (paroxysmal supraventricular tachycardia), Hyperlipidemia, Hypertension, Mitral regurgitation, Palpitations, Penetrating ulcer of aorta ([place]), Right ovarian epithelial cancer ([place]), Stroke ([place]), and Thoracic aortic aneurysm ([place]). who presents with  for evaluation of right arm, numbness and jerking of right arm.  She states last night she was trying to cut bacon and noticed she was having a lot of trouble doing so. She awoke this morning with increased weakness of the right arm and now with continuous jerking movements. Daughter at the bedside stated she did have a period of slurred speech that has since resolved.    ROS   Constitutional Denies weight loss, fever and chills.   HEENT Denies changes in vision and hearing.   Respiratory Denies SOB and cough.   CV Denies palpitations and CP   GI Denies abdominal pain, nausea, vomiting and diarrhea.   GU Denies dysuria and urinary frequency.   MSK Denies myalgia and joint pain.   Skin Denies rash and pruritus.   Neurological Denies headache and syncope.   Psychiatric Denies recent changes in mood. Denies anxiety and depression.    Past History   Past Medical History:  Diagnosis Date   Atrial septal aneurysm    Cancer ([place])    basal cell carcinoma   Carotid artery disease ([place])    History of PSVT (paroxysmal supraventricular tachycardia)    Reported back as far as 2010.   Hyperlipidemia    Hypertension    Mitral regurgitation    Palpitations    Penetrating ulcer of aorta (HCC)     Right ovarian epithelial cancer (HCC)    Stroke ([place])    x 3 cannot write as quickly as used to   Thoracic aortic aneurysm [facility])    Past Surgical History:  Procedure Laterality Date   APPENDECTOMY  1993   CERVICAL BIOPSY  W/ LOOP ELECTRODE EXCISION     CHOLECYSTECTOMY  1996   ENDARTERECTOMY Left 11/03/2016   Procedure: Exploration of Carotid  ENDARTERECTOMY.;  Surgeon: Elam Dutch, MD;  Location: [facility];  Service: Vascular;  Laterality: Left;   ENDARTERECTOMY Left 11/03/2016   Procedure: ENDARTERECTOMY CAROTID;  Surgeon: Elam Dutch, MD;  Location: [facility];  Service: Vascular;  Laterality: Left;   IR IMAGING GUIDED PORT INSERTION  11/12/2021   LAPAROSCOPY N/A 11/03/2021   Procedure: DIAGNOSTIC LAPAROSCOPY WITH BIOPSY;  Surgeon: Lafonda Mosses, MD;  [facility];  Service: Gynecology;  Laterality: N/A;   LOOP RECORDER INSERTION N/A 11/02/2016   Procedure: Loop Recorder Insertion;  Surgeon: [name], MD;  Location: [facility] CV LAB;  Service: Cardiovascular;  Laterality: N/A;   ROTATOR CUFF REPAIR Right 1990   TEE WITHOUT CARDIOVERSION N/A 11/02/2016   Procedure: TRANSESOPHAGEAL ECHOCARDIOGRAM (TEE);  Surgeon: Lelon Perla, MD;  Location: [facility] ENDOSCOPY;  Service: Cardiovascular;  Laterality: N/A;   US ECHOCARDIOGRAPHY  01/09/2009   EF 55-60%   WRIST FRACTURE SURGERY     Family History  Problem  Relation Age of Onset   Varicose Veins Mother    Cardiomyopathy Mother    Cancer Father        prostate   Diabetes Father    Kidney cancer Brother    Arrhythmia Brother    Colon cancer Neg Hx    Breast cancer Neg Hx    Ovarian cancer Neg Hx    Endometrial cancer Neg Hx    Pancreatic cancer Neg Hx    Social History   Socioeconomic History   Marital status: Widowed    Spouse name: Wouter   Number of children: 3   Years of education: 14   Highest education level: Not on file  Occupational History    Comment: retired, travel agent  Tobacco Use   Smoking  status: Former    Packs/day: 0.25    Years: 45.00    Total pack years: 11.25    Types: Cigarettes    Quit date: 06/10/2016    Years since quitting: 5.7   Smokeless tobacco: Never   Tobacco comments:    Occasionally tobacco use - 1-2 cig a week  Vaping Use   Vaping Use: Never used  Substance and Sexual Activity   Alcohol use: Yes    Alcohol/week: 7.0 standard drinks of alcohol    Types: 7 Glasses of wine per week    Comment: glass of wine 3-4 days per wek   Drug use: No   Sexual activity: Not on file  Other Topics Concern   Not on file  Social History Narrative   Lives at home w/husband   Caffeine- coffee, 2 cups daily   Social Determinants of Health   Financial Resource Strain: Low Risk  (11/17/2021)   Overall Financial Resource Strain (CARDIA)    Difficulty of Paying Living Expenses: Not very hard  Food Insecurity: No Food Insecurity (11/17/2021)   Hunger Vital Sign    Worried About Running Out of Food in the Last Year: Never true    Ran Out of Food in the Last Year: Never true  Transportation Needs: No Transportation Needs (11/17/2021)   PRAPARE - Hydrologist (Medical): No    Lack of Transportation (Non-Medical): No  Physical Activity: Not on file  Stress: Not on file  Social Connections: Not on file   Allergies  Allergen Reactions   Losartan Anaphylaxis    Per daughter and Patient, states that provider suggested she is having allergic reaction to medication   Dorzolamide Hcl-Timolol Mal Itching    Made vision very blurry and itching.Eyes very red.   Morphine And Related Swelling   Levofloxacin Anxiety and Other (See Comments)    Double vision    Medications  (Not in a hospital admission)    Vitals   Vitals:   03/24/22 1026 03/24/22 1027 03/24/22 1259  BP: 131/79  136/75  Pulse: 66  66  Resp: 18  19  Temp: 98 F (36.7 C)  98.2 F (36.8 C)  TempSrc: Oral  Oral  SpO2: 96%  100%  Weight:  71.2 kg   Height:  5' 6.5" (1.689 m)       Body mass index is 24.96 kg/m.  Physical Exam   General: Laying comfortably in bed; in no acute distress.  HENT: Normal oropharynx and mucosa. Normal external appearance of ears and nose.  Neck: Supple, no pain or tenderness  CV: No JVD. No peripheral edema.  Pulmonary: Symmetric Chest rise. Normal respiratory effort.  Abdomen: Soft to touch,  non-tender.  Ext: No cyanosis, edema, or deformity  Skin: No rash. Normal palpation of skin.  Bruising on back of hands  Musculoskeletal: Normal digits and nails by inspection. No clubbing.   Neurologic Examination  Mental status/Cognition: Alert, oriented to self, place, month and year, good attention.  Speech/language: Fluent, comprehension intact, object naming intact, repetition intact.  Cranial nerves:   CN II Pupils equal and reactive to light, no VF deficits    CN III,IV,VI EOM intact, no gaze preference or deviation, no nystagmus    CN V normal sensation in V1, V2, and V3 segments bilaterally    CN VII no asymmetry, no nasolabial fold flattening    CN VIII normal hearing to speech    CN IX & X normal palatal elevation, no uvular deviation    CN XI 5/5 head turn and 5/5 shoulder shrug bilaterally    CN XII midline tongue protrusion    Motor:  Left arm 5/5, right arm 4/5, bilateral lowers 5/5. Rhythmic jerking movements of right arm    Sensation: equal and symmetric bilaterally  Coordination/Complex Motor:  - Finger to Nose intact  - Heel to shin intact - Rapid alternating movement decreased on right - Gait: deferred  Labs   CBC:  Recent Labs  Lab 03/24/22 1114 03/24/22 1123  WBC 8.9  --   NEUTROABS 6.1  --   HGB 12.8 13.6  HCT 39.6 40.0  MCV 92.3  --   PLT 481*  --     Basic Metabolic Panel:  Lab Results  Component Value Date   NA 139 03/24/2022   K 5.0 03/24/2022   CO2 26 03/24/2022   GLUCOSE 95 03/24/2022   BUN 8 03/24/2022   CREATININE 0.60 03/24/2022   CALCIUM 8.7 (L) 03/24/2022   GFRNONAA >60  03/24/2022   GFRAA >60 11/06/2016   Lipid Panel:  Lab Results  Component Value Date   LDLCALC 89 11/01/2016   HgbA1c:  Lab Results  Component Value Date   HGBA1C 5.7 (H) 11/01/2016   Urine Drug Screen:     Component Value Date/Time   LABOPIA NONE DETECTED 10/31/2016 1541   COCAINSCRNUR NONE DETECTED 10/31/2016 1541   LABBENZ NONE DETECTED 10/31/2016 1541   AMPHETMU NONE DETECTED 10/31/2016 1541   THCU NONE DETECTED 10/31/2016 1541   LABBARB NONE DETECTED 10/31/2016 1541    Alcohol Level     Component Value Date/Time   ETH <10 03/24/2022 1114    CT Head without contrast(Personally reviewed): 1.No acute process 2. Known small chronic cortically-based infarcts within the left frontal, parietal and occipital lobes. 3. Background mild-to-moderate chronic small ischemic changes within the cerebral white matter. 4. Minimal bilateral ethmoid sinus disease.  CT angio Head and Neck with contrast(Personally reviewed): 1. Incompletely imaged ascending thoracic aortic aneurysm. The aneurysm has not changed in size at the imaged levels as compared to the prior chest CT of 05/19/2020. 2. 15 x 6 mm penetrating atherosclerotic ulcer along the left aspect of the aortic arch. This penetrating atherosclerotic ulcer has changed in morphology since the prior CTA of 11/04/2016, but has not significantly changed in size. 3. Redemonstrated severe stenosis within the mid left subclavian artery (at least 80%). 4. Sequelae of prior left carotid endarterectomy. New from the prior CTA of 11/04/2016, there is a 60-70% atherosclerotic stenosis within the proximal left ICA. 5. The right common carotid, right internal carotid and bilateral vertebral arteries are patent within the neck without hemodynamically significant stenosis. 6.  Aortic Atherosclerosis (ICD10-I70.0).  rEEG:  ordered  Impression   Regina James is a 88 y.o. female with PMH significant for ovarian cancer, HTN, HLD, CAD, CVA. Her  neurologic examination is notable for right arm weakness and involuntary rhythmic jerking.  Episodes concerning for Focal Seizures vs less likely myoclonic jerks. Given her hx of ovarian cancer and inability to tolerate chemo and thus on outpatient hospice, suspect that she could have a small brain or skull met impinging on the brain. Could also be paraneoplastic in etiology.  Recommendations  - MRI brain w/wo to evaluate for brain met - Load with Keppra 1511m IV x1 and then 500 mg BID  - Routine EEG - evaluate for infection -check UA and CXR - Neurology will continue to follow   DBeulah GandyDNP, ACNPC-AG  [facility]   ______________________________________________________________________  NEUROHOSPITALIST ADDENDUM Performed a face to face diagnostic evaluation.   I have reviewed the contents of history and physical exam as documented by PA/ARNP/Resident and agree with above documentation.  I have discussed and formulated the above plan as documented. Edits to the note have been made as needed.  SDonnetta Simpers MD [facility] [phone]  If 7pm to 7am, please call on call as listed on AMION.

## 2022-03-24 NOTE — Hospital Course (Signed)
Regina James is a 88 y.o. female with medical history significant for stage III right ovarian cancer, history of CVA, ascending thoracic aortic aneurysm, penetrating ulcer of aorta, carotid artery disease s/p left CEA, HTN, HLD who is admitted with an acute infarct of the left occipital lobe with tremors of the right hand concerning for seizure-like activity.

## 2022-03-24 NOTE — ED Triage Notes (Signed)
Went to bed at 10pm last night and then throughout the night had intermittent numbness to right arm.  Woke up around 800am and had jerking and weakness to right arm.  Hx of stroke.

## 2022-03-25 DIAGNOSIS — I639 Cerebral infarction, unspecified: Secondary | ICD-10-CM | POA: Diagnosis not present

## 2022-03-25 DIAGNOSIS — R109 Unspecified abdominal pain: Secondary | ICD-10-CM | POA: Diagnosis not present

## 2022-03-25 DIAGNOSIS — R569 Unspecified convulsions: Secondary | ICD-10-CM | POA: Diagnosis not present

## 2022-03-25 DIAGNOSIS — I69318 Other symptoms and signs involving cognitive functions following cerebral infarction: Secondary | ICD-10-CM | POA: Diagnosis not present

## 2022-03-25 DIAGNOSIS — I10 Essential (primary) hypertension: Secondary | ICD-10-CM

## 2022-03-25 DIAGNOSIS — R197 Diarrhea, unspecified: Secondary | ICD-10-CM | POA: Diagnosis not present

## 2022-03-25 DIAGNOSIS — R634 Abnormal weight loss: Secondary | ICD-10-CM | POA: Diagnosis not present

## 2022-03-25 DIAGNOSIS — C569 Malignant neoplasm of unspecified ovary: Secondary | ICD-10-CM

## 2022-03-25 DIAGNOSIS — I2510 Atherosclerotic heart disease of native coronary artery without angina pectoris: Secondary | ICD-10-CM | POA: Diagnosis not present

## 2022-03-25 LAB — CBC
HCT: 37.2 % (ref 36.0–46.0)
Hemoglobin: 12.4 g/dL (ref 12.0–15.0)
MCH: 30.1 pg (ref 26.0–34.0)
MCHC: 33.3 g/dL (ref 30.0–36.0)
MCV: 90.3 fL (ref 80.0–100.0)
Platelets: 448 10*3/uL — ABNORMAL HIGH (ref 150–400)
RBC: 4.12 MIL/uL (ref 3.87–5.11)
RDW: 15 % (ref 11.5–15.5)
WBC: 8.5 10*3/uL (ref 4.0–10.5)
nRBC: 0 % (ref 0.0–0.2)

## 2022-03-25 LAB — LIPID PANEL
Cholesterol: 173 mg/dL (ref 0–200)
HDL: 25 mg/dL — ABNORMAL LOW (ref >40)
LDL Cholesterol: 111 mg/dL — ABNORMAL HIGH (ref 0–99)
Total CHOL/HDL Ratio: 6.9 RATIO
Triglycerides: 187 mg/dL — ABNORMAL HIGH (ref <150)
VLDL: 37 mg/dL (ref 0–40)

## 2022-03-25 LAB — BASIC METABOLIC PANEL
Anion gap: 9 (ref 5–15)
BUN: 6 mg/dL — ABNORMAL LOW (ref 8–23)
CO2: 27 mmol/L (ref 22–32)
Calcium: 8.8 mg/dL — ABNORMAL LOW (ref 8.9–10.3)
Chloride: 104 mmol/L (ref 98–111)
Creatinine, Ser: 0.79 mg/dL (ref 0.44–1.00)
GFR, Estimated: >60 mL/min (ref >60)
Glucose, Bld: 117 mg/dL — ABNORMAL HIGH (ref 70–99)
Potassium: 3.2 mmol/L — ABNORMAL LOW (ref 3.5–5.1)
Sodium: 140 mmol/L (ref 135–145)

## 2022-03-25 LAB — RAPID URINE DRUG SCREEN, HOSP PERFORMED
Amphetamines: NOT DETECTED
Barbiturates: NOT DETECTED
Benzodiazepines: POSITIVE — AB
Cocaine: NOT DETECTED
Opiates: NOT DETECTED
Tetrahydrocannabinol: NOT DETECTED

## 2022-03-25 MED ORDER — ATORVASTATIN CALCIUM 80 MG PO TABS: 80.0000 mg | ORAL_TABLET | Freq: Every day | ORAL | Status: DC

## 2022-03-25 MED ORDER — ATORVASTATIN CALCIUM 80 MG PO TABS: 80.0000 mg | ORAL_TABLET | Freq: Every day | ORAL | 0 refills | Status: DC

## 2022-03-25 MED ORDER — DIVALPROEX SODIUM ER 500 MG PO TB24: 500.0000 mg | ORAL_TABLET | Freq: Every day | ORAL | 0 refills | Status: DC

## 2022-03-25 MED ORDER — ASPIRIN 81 MG PO TBEC: 81.0000 mg | DELAYED_RELEASE_TABLET | Freq: Every day | ORAL | Status: DC

## 2022-03-25 MED ORDER — CLOPIDOGREL BISULFATE 75 MG PO TABS: 75.0000 mg | ORAL_TABLET | Freq: Every day | ORAL | 0 refills | Status: DC

## 2022-03-25 MED ORDER — ASPIRIN 81 MG PO TBEC
81.0000 mg | DELAYED_RELEASE_TABLET | Freq: Every day | ORAL | Status: DC
  Administered 2022-03-25: 81 mg via ORAL
  Filled 2022-03-25: qty 1

## 2022-03-25 MED ORDER — DIVALPROEX SODIUM ER 500 MG PO TB24: 500.0000 mg | ORAL_TABLET | Freq: Every day | ORAL | Status: DC

## 2022-03-25 MED ORDER — POTASSIUM CHLORIDE 10 MEQ/100ML IV SOLN
10.0000 meq | INTRAVENOUS | Status: AC
  Administered 2022-03-25 (×2): 10 meq via INTRAVENOUS
  Filled 2022-03-25 (×2): qty 100

## 2022-03-25 MED ORDER — CLOPIDOGREL BISULFATE 75 MG PO TABS
75.0000 mg | ORAL_TABLET | Freq: Every day | ORAL | Status: DC
  Administered 2022-03-25: 75 mg via ORAL
  Filled 2022-03-25: qty 1

## 2022-03-25 MED ORDER — VALPROATE SODIUM 100 MG/ML IV SOLN
1000.0000 mg | Freq: Once | INTRAVENOUS | Status: AC
  Administered 2022-03-25: 1000 mg via INTRAVENOUS
  Filled 2022-03-25: qty 10

## 2022-03-25 NOTE — Progress Notes (Signed)
[employer] [facility]) Hospital Liaison Note  [name] is a current hospice patient with [employer] [facility] with a CTI of carcinoma of the ovary. She was sent to ED for symptoms of stroke: right arm weakness. Daughter is at bedside. Patient was admitted for Acute CVA. This is a related admission per [facility] MD.  Visited patient at bedside as well as daughter. Patient is alert and oriented, but forgetful. She is still having the involuntary jerking of right upper extremity. She explains that it is not painful just aggravating. She has been evaluated by the stroke team and they changed her Keppra to Depokote and is ok for discharge home. Daughter would like to take pt home via car.   VS: 110/43, 81, 16, 96%RA, 98.2 I&O: 152.4/0? IV/PRN: levETIRAcetam (KEPPRA) IVPB 500 mg/100 mL premix Dose: 500 mg Freq: Every 12 hours Route: IV -Stopped at 0200 potassium chloride 10 mEq in 100 mL IVPB Dose: 10 mEq Freq: Every 1 hr x 4 Route: IV Last Dose: 10 mEq (03/25/22 1425)  valproate (DEPACON) 1,000 mg in dextrose 5 % 50 mL IVPB Dose: 1,000 mg Freq:  Once Route: IV  LORazepam (ATIVAN) injection 1 mg Dose: 1 mg Freq: Every 15 min PRN Route: IV -12/20-1618 gadobutrol (GADAVIST) 1 MMOL/ML injection 7 mL Dose: 7 mL Freq: Once PRN Route: IV -12/20-1738  Labs:   Latest Reference Range & Units 03/25/22 16:10  BASIC METABOLIC PANEL  Rpt !  Sodium 135 - 145 mmol/L 140  Potassium 3.5 - 5.1 mmol/L 3.2 (L)  Chloride 98 - 111 mmol/L 104  CO2 22 - 32 mmol/L 27  Glucose 70 - 99 mg/dL 117 (H)  BUN 8 - 23 mg/dL 6 (L)  Creatinine 0.44 - 1.00 mg/dL 0.79  Calcium 8.9 - 10.3 mg/dL 8.8 (L)  Anion gap 5 - 15  9  GFR, Estimated >60 mL/min >60  WBC 4.0 - 10.5 K/uL 8.5  RBC 3.87 - 5.11 MIL/uL 4.12  Hemoglobin 12.0 - 15.0 g/dL 12.4  HCT 36.0 - 46.0 % 37.2  MCV 80.0 - 100.0 fL 90.3  MCH 26.0 - 34.0 pg 30.1  MCHC 30.0 - 36.0 g/dL 33.3  RDW 11.5 - 15.5 % 15.0  Platelets 150 - 400 K/uL 448 (H)  nRBC 0.0 -  0.2 % 0.0  !: Data is abnormal (L): Data is abnormally low (H): Data is abnormally high  Patient is inpatient appropriate due to management of stroke requiring IV medications.  Problem list: Acute CVA (cerebrovascular accident)  -MRI brain shows small acute infarct in the left occipital lobe.  No mass lesion. -Neurology following -CTA head negative for LVO or proximal high-grade arterial stenosis -CTA neck with severe stenosis mid-left subclavian artery, 60-70% stenosis proximal left ICA -Continue aspirin 325 mg -Keep on telemetry, continue neurochecks -PT/OT/SLP eval -Allow permissive hypertension for now -Had 2D echo on 02/01/2022.  Will not repeat unless neurology wants it -LDL 111.  Switch to Lipitor 80 mg nightly -Follow cultures.  Monitor mental status.   Seizure-like activity  Continuous rhythmic jerking motion of the right hand.  Neurology following for focal seizure activity versus myoclonic jerks. -Follow EEG -S/p IV Keppra 1500 mg load, continue 500 mg BID per neuro -seizure precautions   Malignant neoplasm of ovary  Goals of care -Follows with oncology, Dr. Alvy Bimler.  Did not tolerate chemotherapy.  Active with home hospice. -Consult palliative care for goals of care discussion   Thoracic aortic aneurysm without rupture ([place]) -Stable appearance on CT.   Essential  hypertension -BP stable.  Holding antihypertensives.   Mixed dyslipidemia -Statin plan as above   Upon discharge please use [facility] as they are contracted with [facility] active hospice patients. This patient is going via private car.  Clementeen Hoof, [place], [facility] [phone]

## 2022-03-25 NOTE — ED Notes (Signed)
Pt soiled herself. Pt ambulated to chair. Pt cleaned. Full linen change. Ambulated pt back to bed. Pt resting. Call bell within reach.

## 2022-03-25 NOTE — Progress Notes (Signed)
   Referral received for Regina James :goals of care discussion. Chart reviewed and noted that family is now requesting patient returns home.  Per most recent outpatient palliative care note on 12/5, goals of care are clear to discontinue cancer treatments and patient has since transitioned to hospice at home with [facility].  I have discussed with [place] hospital liaisons who will meet with family to discuss goals of care and returning home to continue with hospice.  Discussed with Dr. Starla Link as well.  PMT will follow peripherally at this time.  Thank you for your referral and allowing PMT to assist in Mrs. Regina James's care.   Dorthy Cooler, [facility] Palliative Medicine Team  Team Phone # [phone]   NO CHARGE

## 2022-03-25 NOTE — Evaluation (Signed)
Speech Language Pathology Evaluation Patient Details Name: Regina James MRN: [id number] DOB: 07-09-1933 Today's Date: 03/25/2022 Time: 9622-2979 SLP Time Calculation (min) (ACUTE ONLY): 15 min  Problem List:  Patient Active Problem List   Diagnosis Date Noted   CVA (cerebral vascular accident) ([place]) 03/25/2022   Acute CVA (cerebrovascular accident) ([place]) 03/24/2022   Seizure-like activity ([place]) 03/24/2022   Goals of care, counseling/discussion 12/17/2021   UTI (urinary tract infection) 11/23/2021   Leukopenia due to antineoplastic chemotherapy ([place]) 11/23/2021   Volume depletion 11/23/2021   Malignant neoplasm of ovary ([place]) 11/09/2021   Pelvic mass in female    Other ascites    Carotid stenosis, left 01/03/2017   History of CEA (carotid endarterectomy) 01/03/2017   Thoracic aortic aneurysm without rupture ([place]) 01/03/2017   Subclavian artery stenosis, left ([place]) 01/03/2017   Right sided weakness    History of basal cell carcinoma    PSVT (paroxysmal supraventricular tachycardia)    History of CVA with residual deficit    Medically noncompliant    Acute blood loss anemia    Dysphagia, post-stroke    Leukocytosis    Tachypnea    Mixed dyslipidemia 11/01/2016   Essential hypertension 11/01/2016   Tachyarrhythmia 11/01/2016   Internal carotid artery stenosis, left 11/01/2016   Aortic arch aneurysm ([place]) 11/01/2016   History of cerebrovascular accident (CVA) due to embolism 10/31/2016   Right arm weakness 06/27/2016   Right arm numbness 06/27/2016   Deviated septum 06/27/2016   Disorder of ethmoidal sinus 06/27/2016   Acute ischemic stroke (HCC)    Dysarthria    Paroxysmal SVT (supraventricular tachycardia) 05/21/2016   Angiopathy, peripheral ([place]) 01/07/2015   Cloudy posterior capsule 06/01/2013   Pseudoaphakia 05/02/2013   Malaise and fatigue 02/16/2013   Cataract, nuclear 01/28/2012   Palpitations 02/17/2011   Claudication ([place]) 02/17/2011   Tobacco abuse  02/17/2011   Past Medical History:  Past Medical History:  Diagnosis Date   Atrial septal aneurysm    Cancer ([place])    basal cell carcinoma   Carotid artery disease ([place])    History of PSVT (paroxysmal supraventricular tachycardia)    Reported back as far as 2010.   Hyperlipidemia    Hypertension    Mitral regurgitation    Palpitations    Penetrating ulcer of aorta (HCC)    Right ovarian epithelial cancer (HCC)    Stroke ([place])    x 3 cannot write as quickly as used to   Thoracic aortic aneurysm [facility])    Past Surgical History:  Past Surgical History:  Procedure Laterality Date   APPENDECTOMY  1993   CERVICAL BIOPSY  W/ LOOP ELECTRODE EXCISION     CHOLECYSTECTOMY  1996   ENDARTERECTOMY Left 11/03/2016   Procedure: Exploration of Carotid  ENDARTERECTOMY.;  Surgeon: Elam Dutch, MD;  Location: [place];  Service: Vascular;  Laterality: Left;   ENDARTERECTOMY Left 11/03/2016   Procedure: ENDARTERECTOMY CAROTID;  Surgeon: Elam Dutch, MD;  Location: [place];  Service: Vascular;  Laterality: Left;   IR IMAGING GUIDED PORT INSERTION  11/12/2021   LAPAROSCOPY N/A 11/03/2021   Procedure: DIAGNOSTIC LAPAROSCOPY WITH BIOPSY;  Surgeon: Lafonda Mosses, MD;  Location: [facility];  Service: Gynecology;  Laterality: N/A;   LOOP RECORDER INSERTION N/A 11/02/2016   Procedure: Loop Recorder Insertion;  Surgeon: [name], MD;  Location: [place] [facility];  Service: Cardiovascular;  Laterality: N/A;   ROTATOR CUFF REPAIR Right 1990   TEE WITHOUT CARDIOVERSION N/A 11/02/2016  Procedure: TRANSESOPHAGEAL ECHOCARDIOGRAM (TEE);  Surgeon: Lelon Perla, MD;  Location: [facility];  Service: Cardiovascular;  Laterality: N/A;   US ECHOCARDIOGRAPHY  01/09/2009   EF 55-60%   WRIST FRACTURE SURGERY     HPI:  History of Present Illness  Pt is an 88 y/o female who presented with R UE weakness. MRI brain showed small acute infarct L occipital lobe, chronic hemorrhagic infarcts in L frontal  and L occipital parietal lobe. PMH: Stage 3 ovarian cancer, CVA, ascending thoracic aortic aneurysm, CAD, HTN, HLD.   Assessment / Plan / Recommendation Clinical Impression  Patient is not currently presenting with any cognitive, linguistic or speech impairments. She does self report that her speech seems a little less clear now and daughter does confirm this, but SLP judged her speech intelligibility to be 100% at conversational level and no significant amount of dysarthria observed. SLP is not recommending any further skilled interventions at this time as she appears to be at her baseline and is modified independent to independent with ADL's.    SLP Assessment  SLP Recommendation/Assessment: Patient does not need any further Speech Lanaguage Pathology Services SLP Visit Diagnosis: Cognitive communication deficit (R41.841);Dysarthria and anarthria (R47.1)    Recommendations for follow up therapy are one component of a multi-disciplinary discharge planning process, led by the attending physician.  Recommendations may be updated based on patient status, additional functional criteria and insurance authorization.    Follow Up Recommendations  No SLP follow up    Assistance Recommended at Discharge  None  Functional Status Assessment Patient has not had a recent decline in their functional status  Frequency and Duration           SLP Evaluation Cognition  Overall Cognitive Status: Within Functional Limits for tasks assessed       Comprehension  Auditory Comprehension Overall Auditory Comprehension: Appears within functional limits for tasks assessed    Expression Expression Primary Mode of Expression: Verbal Verbal Expression Overall Verbal Expression: Appears within functional limits for tasks assessed   Oral / Motor  Oral Motor/Sensory Function Overall Oral Motor/Sensory Function: Within functional limits Motor Speech Overall Motor Speech: Appears within functional limits for  tasks assessed Respiration: Within functional limits Resonance: Within functional limits Intelligibility: Intelligible Motor Planning: Witnin functional limits            Sonia Baller, MA, CCC-SLP Speech Therapy

## 2022-03-25 NOTE — Procedures (Signed)
Patient Name: Regina James  MRN: [id number]  Epilepsy Attending: Lora Havens  Referring Physician/Provider: Donnetta Simpers, MD  Date: 03/24/2022 Duration: 25.48 mins  Patient history: 88 year old female with right arm weakness and involuntary rhythmic jerking.  EEG to evaluate for seizure.  Level of alertness: Awake, asleep  AEDs during EEG study: LEV  Technical aspects: This EEG study was done with scalp electrodes positioned according to the 10-20 International system of electrode placement. Electrical activity was reviewed with band pass filter of 1-'70Hz'$ , sensitivity of 7 uV/mm, display speed of 25m/sec with a '60Hz'$  notched filter applied as appropriate. EEG data were recorded continuously and digitally stored.  Video monitoring was available and reviewed as appropriate.  Description: The posterior dominant rhythm consists of 8 Hz activity of moderate voltage (25-35 uV) seen predominantly in posterior head regions, symmetric and reactive to eye opening and eye closing. Sleep was characterized by vertex waves, sleep spindles (12 to 14 Hz), maximal frontocentral region. EEG also showed intermittent rhythmic 3 to 5 Hz theta-delta slowing in left hemisphere, maximal left temporal region.  Hyperventilation and photic stimulation were not performed.     ABNORMALITY - Intermittent slow, left hemisphere, maximal left temporal region  IMPRESSION: This study is suggestive of cortical dysfunction in left hemisphere, maximal temporal region likely secondary to underlying strokes. No seizures or epileptiform discharges were seen throughout the recording.  Please note that lack of epileptiform activity does not exclude the diagnosis of epilepsy.  Regina James

## 2022-03-25 NOTE — Discharge Planning (Signed)
Pt currently active with [facility] for Home Hospice services as confirmed by [facility] with [facility] [place].  Pt will resume Home Hospice services at discharge.

## 2022-03-25 NOTE — Progress Notes (Signed)
PROGRESS NOTE    KATHELEEN STELLA  FAO:[id number] DOB: 08-29-1933 DOA: 03/24/2022 PCP: Kathyrn Lass, MD   Brief Narrative:  88 y.o. female with medical history significant for stage III right ovarian cancer, history of CVA, ascending thoracic aortic aneurysm, penetrating ulcer of aorta, carotid artery disease s/p left CEA, HTN, HLD presents presented with right arm weakness along with jerking movement of her right hand and brief slurred speech.  On presentation, CT head was negative for acute intracranial abnormality.  CTA neck showed incompletely imaged descending thoracic aortic aneurysm, not changed in size compared to prior. 15.6 mm penetrating atherosclerotic ulcer along the left aspect of the aortic arch again noted, not significantly changed in size. Redemonstrated severe stenosis within the mid left subclavian artery (at least 80%) also seen. Prior left CEA with new 60-70% atherosclerotic stenosis within the proximal left ICA noted. Right common carotid, internal carotid, and bilateral vertebral arteries are patent.  CTA head negative for intracranial LVO or proximal high-grade arterial stenosis MRI brain showed small acute infarct of left occipital lobe.  Neurology was consulted and patient was started on IV Keppra.  EEG ordered.  Assessment & Plan:   Acute CVA (cerebrovascular accident)  -MRI brain shows small acute infarct in the left occipital lobe.  No mass lesion. -Neurology following -CTA head negative for LVO or proximal high-grade arterial stenosis -CTA neck with severe stenosis mid-left subclavian artery, 60-70% stenosis proximal left ICA -Continue aspirin 325 mg -Keep on telemetry, continue neurochecks -PT/OT/SLP eval -Allow permissive hypertension for now -Had 2D echo on 02/01/2022.  Will not repeat unless neurology wants it -LDL 111.  Switch to Lipitor 80 mg nightly -Follow cultures.  Monitor mental status.   Seizure-like activity  Continuous rhythmic jerking motion of  the right hand.  Neurology following for focal seizure activity versus myoclonic jerks. -Follow EEG -S/p IV Keppra 1500 mg load, continue 500 mg BID per neuro -seizure precautions   Malignant neoplasm of ovary  Goals of care -Follows with oncology, Dr. Alvy Bimler.  Did not tolerate chemotherapy.  Active with home hospice. -Consult palliative care for goals of care discussion   Thoracic aortic aneurysm without rupture ([place]) -Stable appearance on CT.   Essential hypertension -BP stable.  Holding antihypertensives.   Mixed dyslipidemia -Statin plan as above   DVT prophylaxis: Lovenox Code Status: DNR Family Communication: None at bedside Disposition Plan: Status is: Observation The patient will require care spanning > 2 midnights and should be moved to inpatient because: Need for PT evaluation.  Consultants: Neurology.  Consult palliative care  Procedures: None  Antimicrobials: None   Subjective: Patient seen and examined at bedside.  Poor historian, slow to respond.  Answering some questions still complaining of right hand jerking movements.  No fever, agitation or vomiting reported.  Objective: Vitals:   03/25/22 0109 03/25/22 0154 03/25/22 0415 03/25/22 0417  BP: (!) 142/91  (!) 167/84   Pulse: 72  71   Resp: 17  12   Temp:  98 F (36.7 C)  98.2 F (36.8 C)  TempSrc:  Oral  Oral  SpO2: 95%  95%   Weight:      Height:       No intake or output data in the 24 hours ending 03/25/22 0719 Filed Weights   03/24/22 1027  Weight: 71.2 kg    Examination:  General exam: Appears calm and comfortable.  Looks chronically ill and deconditioned.  On room air. Respiratory system: Bilateral decreased breath sounds at bases with  scattered crackles Cardiovascular system: S1 & S2 heard, Rate controlled Gastrointestinal system: Abdomen is nondistended, soft and nontender. Normal bowel sounds heard. Extremities: No cyanosis, clubbing, edema  Central nervous system: Awake, slow  to respond, answering some questions.  Poor historian.  No focal neurological deficits.  Right upper extremity intermittent jerking movements noted  skin: No rashes, lesions or ulcers Psychiatry: Flat affect.  Not agitated.    Data Reviewed: I have personally reviewed following labs and imaging studies  CBC: Recent Labs  Lab 03/24/22 1114 03/24/22 1123 03/25/22 0030  WBC 8.9  --  8.5  NEUTROABS 6.1  --   --   HGB 12.8 13.6 12.4  HCT 39.6 40.0 37.2  MCV 92.3  --  90.3  PLT 481*  --  308*   Basic Metabolic Panel: Recent Labs  Lab 03/24/22 1114 03/24/22 1123 03/25/22 0030  NA 140 139 140  K 4.2 5.0 3.2*  CL 104 103 104  CO2 26  --  27  GLUCOSE 91 95 117*  BUN 7* 8 6*  CREATININE 0.71 0.60 0.79  CALCIUM 8.7*  --  8.8*   GFR: Estimated Creatinine Clearance: 48.2 mL/min (by C-G formula based on SCr of 0.79 mg/dL). Liver Function Tests: Recent Labs  Lab 03/24/22 1114  AST 27  ALT 12  ALKPHOS 73  BILITOT 0.7  PROT 6.3*  ALBUMIN 3.0*   No results for input(s): "LIPASE", "AMYLASE" in the last 168 hours. Recent Labs  Lab 03/24/22 1855  AMMONIA 21   Coagulation Profile: Recent Labs  Lab 03/24/22 1114  INR 1.2   Cardiac Enzymes: No results for input(s): "CKTOTAL", "CKMB", "CKMBINDEX", "TROPONINI" in the last 168 hours. BNP (last 3 results) No results for input(s): "PROBNP" in the last 8760 hours. HbA1C: No results for input(s): "HGBA1C" in the last 72 hours. CBG: Recent Labs  Lab 03/24/22 1047  GLUCAP 102*   Lipid Profile: Recent Labs    03/25/22 0050  CHOL 173  HDL 25*  LDLCALC 111*  TRIG 187*  CHOLHDL 6.9   Thyroid Function Tests: No results for input(s): "TSH", "T4TOTAL", "FREET4", "T3FREE", "THYROIDAB" in the last 72 hours. Anemia Panel: No results for input(s): "VITAMINB12", "FOLATE", "FERRITIN", "TIBC", "IRON", "RETICCTPCT" in the last 72 hours. Sepsis Labs: No results for input(s): "PROCALCITON", "LATICACIDVEN" in the last 168  hours.  No results found for this or any previous visit (from the past 240 hour(s)).       Radiology Studies: MR Brain W and Wo Contrast  Result Date: 03/24/2022 CLINICAL DATA:  Acute neuro deficit. EXAM: MRI HEAD WITHOUT AND WITH CONTRAST TECHNIQUE: Multiplanar, multiecho pulse sequences of the brain and surrounding structures were obtained without and with intravenous contrast. CONTRAST:  42m GADAVIST GADOBUTROL 1 MMOL/ML IV SOLN COMPARISON:  CT head 03/24/2022 FINDINGS: Brain: Small acute infarct in the left lateral occipital lobe white matter. This measures approximately 5 x 10 mm. No other acute infarct Chronic hemorrhagic infarcts in the left frontal lobe and left occipital parietal lobe. There is mild vascular enhancement in the left occipital parietal lobe in the area of infarct. These may be collateral vessels. Chronic microvascular ischemic changes in the white matter. No mass lesion. Vascular: Normal arterial flow voids. Skull and upper cervical spine: No focal skeletal lesion. Sinuses/Orbits: Paranasal sinuses clear. Bilateral cataract extraction Other: None IMPRESSION: 1. Small acute infarct left occipital lobe. 2. Chronic hemorrhagic infarcts in the left frontal lobe and left occipital parietal lobe. 3. Chronic microvascular ischemic change in the white  matter. Electronically Signed   By: Franchot Gallo M.D.   On: 03/24/2022 17:54   DG Chest 1 View  Result Date: 03/24/2022 CLINICAL DATA:  Right arm numbness EXAM: CHEST  1 VIEW COMPARISON:  Chest x-ray dated November 27, 2021 FINDINGS: Cardiac and mediastinal contours are unchanged. Right chest wall port is unchanged in position. Mild bibasilar opacities which are likely due to atelectasis. Lungs otherwise clear. No large pleural effusion or pneumothorax. IMPRESSION: Mild bibasilar opacities which are likely due to atelectasis. Electronically Signed   By: Yetta Glassman M.D.   On: 03/24/2022 16:47   CT ANGIO HEAD NECK W WO  CM  Result Date: 03/24/2022 CLINICAL DATA:  Provided history: Neuro deficit, acute, stroke suspected. Additional history provided: Intermittent numbness in right arm. Jerking and weakness in right arm. History of stroke. EXAM: CT ANGIOGRAPHY HEAD AND NECK TECHNIQUE: Multidetector CT imaging of the head and neck was performed using the standard protocol during bolus administration of intravenous contrast. Multiplanar CT image reconstructions and MIPs were obtained to evaluate the vascular anatomy. Carotid stenosis measurements (when applicable) are obtained utilizing NASCET criteria, using the distal internal carotid diameter as the denominator. RADIATION DOSE REDUCTION: This exam was performed according to the departmental dose-optimization program which includes automated exposure control, adjustment of the mA and/or kV according to patient size and/or use of iterative reconstruction technique. CONTRAST:  16m OMNIPAQUE IOHEXOL 350 MG/ML SOLN COMPARISON:  Prior head CT examinations 11/23/2021 and earlier. Brain MRI 11/04/2016. CT angiogram head/neck 11/04/2016. Chest CT 05/19/2020. FINDINGS: CT HEAD FINDINGS Brain: Mild generalized cerebral atrophy. Multiple known small chronic cortically-based infarcts within the left frontal, parietal and occipital lobes, many of which were better appreciated on the prior brain MRI of 11/04/2016 (acute at that time). Background mild-to-moderate patchy and ill-defined hypoattenuation within the cerebral white matter nonspecific but compatible with chronic small vessel image disease. There is no acute intracranial hemorrhage. No acute demarcated cortical infarct is identified. No extra-axial fluid collection. No evidence of an intracranial mass. No midline shift. Vascular: No hyperdense vessel.  Atherosclerotic calcifications. Skull: No fracture or aggressive osseous lesion. Sinuses/Orbits: No mass or acute finding within the imaged orbits. Minimal mucosal thickening and fluid  scattered within the bilateral ethmoid air cells. Trace mucosal thickening within the bilateral sphenoid sinuses. Review of the MIP images confirms the above findings CTA NECK FINDINGS Aortic arch: Incompletely imaged ascending thoracic aortic aneurysm, not appreciably changed at the imaged levels as compared to the prior chest CT of 05/19/2020. 15 x 6 mm penetrating atherosclerotic ulcer along the left aspect of the aortic arch, somewhat changed in morphology but not significantly changed in size as compared to the prior CTA head/neck of 11/03/2016. Atherosclerotic plaque within the visualized aortic arch and proximal major branch vessels of the neck. Redemonstrated high-grade stenosis within the mid left subclavian artery (of at least 80%). Streak and beam hardening artifact arising from a dense right-sided contrast bolus partially obscures the right subclavian artery. Within this limitation, there is no appreciable hemodynamically significant stenosis within the innominate or right subclavian arteries. Right carotid system: CCA and ICA patent within the neck without hemodynamically significant stenosis (50% or greater). Some soft plaque is present within the mid to distal CCA, about the carotid bifurcation and within the proximal ICA. Left carotid system: Prior left carotid endarterectomy. Soft plaque within the mid to distal CCA and within the proximal ICA and ECA. New from the prior examination of 11/03/2016, there is a 60-70% stenosis within the proximal  ICA. Vertebral arteries: Vertebral arteries patent within the neck without significant stenosis. The left vertebral artery is dominant. Skeleton: Cervical spondylosis. No acute fracture or aggressive osseous lesion. Other neck: No neck mass or cervical lymphadenopathy. Upper chest: No consolidation within the imaged lung apices. Review of the MIP images confirms the above findings CTA HEAD FINDINGS Anterior circulation: The intracranial internal carotid  arteries are patent. Atherosclerotic plaque within both vessels with no more than mild stenosis. The M1 middle cerebral arteries are patent. Atherosclerotic irregularity of the M2 and more distal MCA vessels bilaterally. No M2 proximal branch occlusion or high-grade proximal stenosis. The anterior cerebral arteries are patent. No intracranial aneurysm is identified. Posterior circulation: The non dominant right vertebral artery is developmentally diminutive intracranially and terminates predominantly as the right PICA. The dominant intracranial left vertebral artery is patent without stenosis and supplies the basilar artery. The basilar artery is patent. The posterior cerebral arteries are patent. A right posterior communicating artery is present. The left posterior communicating artery is diminutive or absent. Venous sinuses: Within the limitations of contrast timing, no convincing thrombus. Anatomic variants: As described. Review of the MIP images confirms the above findings IMPRESSION: CT head: 1. No evidence of acute intracranial abnormality. 2. Known small chronic cortically-based infarcts within the left frontal, parietal and occipital lobes. 3. Background mild-to-moderate chronic small ischemic changes within the cerebral white matter. 4. Minimal bilateral ethmoid sinus disease. CTA neck: 1. Incompletely imaged ascending thoracic aortic aneurysm. The aneurysm has not changed in size at the imaged levels as compared to the prior chest CT of 05/19/2020. 2. 15 x 6 mm penetrating atherosclerotic ulcer along the left aspect of the aortic arch. This penetrating atherosclerotic ulcer has changed in morphology since the prior CTA of 11/04/2016, but has not significantly changed in size. 3. Redemonstrated severe stenosis within the mid left subclavian artery (at least 80%). 4. Sequelae of prior left carotid endarterectomy. New from the prior CTA of 11/04/2016, there is a 60-70% atherosclerotic stenosis within the  proximal left ICA. 5. The right common carotid, right internal carotid and bilateral vertebral arteries are patent within the neck without hemodynamically significant stenosis. 6.  Aortic Atherosclerosis (ICD10-I70.0). CTA head: Intracranial atherosclerotic disease, as described. No intracranial large vessel occlusion or proximal high-grade arterial stenosis is identified. Electronically Signed   By: Kellie Simmering D.O.   On: 03/24/2022 13:48        Scheduled Meds:   stroke: early stages of recovery book   Does not apply Once   aspirin  325 mg Oral Daily   doxylamine (Sleep)  50 mg Oral QHS   enoxaparin (LOVENOX) injection  40 mg Subcutaneous Q24H   latanoprost  1 drop Left Eye QHS   pravastatin  10 mg Oral Daily   timolol  1 drop Left Eye BID   Continuous Infusions:  levETIRAcetam Stopped (03/25/22 0316)   potassium chloride            Aline August, MD [facility] 03/25/2022, 7:19 AM

## 2022-03-25 NOTE — Progress Notes (Addendum)
STROKE TEAM PROGRESS NOTE   INTERVAL HISTORY Her daughter is at the bedside.  Hospice patient due to stage 3 ovarian cancer. EEG - cortical dysfunction but no seizure activity. Otherwise neurologically and hemodynamically stable Keppra '500mg'$ /BID ordered overnight but she is still having myoclonic tremors in her right arm. Will try depakote.  MRI scan shows a small left parieto-occipital white matter infarct doubt if this is related to patient's myoclonic tremors Vitals:   03/25/22 0154 03/25/22 0415 03/25/22 0417 03/25/22 0823  BP:  (!) 167/84  (!) 110/43  Pulse:  71  81  Resp:  12  16  Temp: 98 F (36.7 C)  98.2 F (36.8 C) 98.2 F (36.8 C)  TempSrc: Oral  Oral Oral  SpO2:  95%  96%  Weight:      Height:       CBC:  Recent Labs  Lab 03/24/22 1114 03/24/22 1123 03/25/22 0030  WBC 8.9  --  8.5  NEUTROABS 6.1  --   --   HGB 12.8 13.6 12.4  HCT 39.6 40.0 37.2  MCV 92.3  --  90.3  PLT 481*  --  253*   Basic Metabolic Panel:  Recent Labs  Lab 03/24/22 1114 03/24/22 1123 03/25/22 0030  NA 140 139 140  K 4.2 5.0 3.2*  CL 104 103 104  CO2 26  --  27  GLUCOSE 91 95 117*  BUN 7* 8 6*  CREATININE 0.71 0.60 0.79  CALCIUM 8.7*  --  8.8*   Lipid Panel:  Recent Labs  Lab 03/25/22 0050  CHOL 173  TRIG 187*  HDL 25*  CHOLHDL 6.9  VLDL 37  LDLCALC 111*   HgbA1c: No results for input(s): "HGBA1C" in the last 168 hours. Urine Drug Screen:  Recent Labs  Lab 03/24/22 2325  LABOPIA NONE DETECTED  COCAINSCRNUR NONE DETECTED  LABBENZ POSITIVE*  AMPHETMU NONE DETECTED  THCU NONE DETECTED  LABBARB NONE DETECTED    Alcohol Level  Recent Labs  Lab 03/24/22 1114  ETH <10    IMAGING past 24 hours EEG adult  Result Date: 03/25/2022 Lora Havens, MD     03/25/2022  8:38 AM Patient Name: YANELLY CANTRELLE MRN: [id number] Epilepsy Attending: Lora Havens Referring Physician/Provider: Donnetta Simpers, MD Date: 03/24/2022 Duration: 25.48 mins Patient history:  88 year old female with right arm weakness and involuntary rhythmic jerking.  EEG to evaluate for seizure. Level of alertness: Awake, asleep AEDs during EEG study: LEV Technical aspects: This EEG study was done with scalp electrodes positioned according to the 10-20 International system of electrode placement. Electrical activity was reviewed with band pass filter of 1-'70Hz'$ , sensitivity of 7 uV/mm, display speed of 72m/sec with a '60Hz'$  notched filter applied as appropriate. EEG data were recorded continuously and digitally stored.  Video monitoring was available and reviewed as appropriate. Description: The posterior dominant rhythm consists of 8 Hz activity of moderate voltage (25-35 uV) seen predominantly in posterior head regions, symmetric and reactive to eye opening and eye closing. Sleep was characterized by vertex waves, sleep spindles (12 to 14 Hz), maximal frontocentral region. EEG also showed intermittent rhythmic 3 to 5 Hz theta-delta slowing in left hemisphere, maximal left temporal region.  Hyperventilation and photic stimulation were not performed.   ABNORMALITY - Intermittent slow, left hemisphere, maximal left temporal region IMPRESSION: This study is suggestive of cortical dysfunction in left hemisphere, maximal temporal region likely secondary to underlying strokes. No seizures or epileptiform discharges were seen throughout the recording. Please  note that lack of epileptiform activity does not exclude the diagnosis of epilepsy. Lora Havens   MR Brain W and Wo Contrast  Result Date: 03/24/2022 CLINICAL DATA:  Acute neuro deficit. EXAM: MRI HEAD WITHOUT AND WITH CONTRAST TECHNIQUE: Multiplanar, multiecho pulse sequences of the brain and surrounding structures were obtained without and with intravenous contrast. CONTRAST:  70m GADAVIST GADOBUTROL 1 MMOL/ML IV SOLN COMPARISON:  CT head 03/24/2022 FINDINGS: Brain: Small acute infarct in the left lateral occipital lobe white matter. This  measures approximately 5 x 10 mm. No other acute infarct Chronic hemorrhagic infarcts in the left frontal lobe and left occipital parietal lobe. There is mild vascular enhancement in the left occipital parietal lobe in the area of infarct. These may be collateral vessels. Chronic microvascular ischemic changes in the white matter. No mass lesion. Vascular: Normal arterial flow voids. Skull and upper cervical spine: No focal skeletal lesion. Sinuses/Orbits: Paranasal sinuses clear. Bilateral cataract extraction Other: None IMPRESSION: 1. Small acute infarct left occipital lobe. 2. Chronic hemorrhagic infarcts in the left frontal lobe and left occipital parietal lobe. 3. Chronic microvascular ischemic change in the white matter. Electronically Signed   By: CFranchot GalloM.D.   On: 03/24/2022 17:54   DG Chest 1 View  Result Date: 03/24/2022 CLINICAL DATA:  Right arm numbness EXAM: CHEST  1 VIEW COMPARISON:  Chest x-ray dated November 27, 2021 FINDINGS: Cardiac and mediastinal contours are unchanged. Right chest wall port is unchanged in position. Mild bibasilar opacities which are likely due to atelectasis. Lungs otherwise clear. No large pleural effusion or pneumothorax. IMPRESSION: Mild bibasilar opacities which are likely due to atelectasis. Electronically Signed   By: LYetta GlassmanM.D.   On: 03/24/2022 16:47   CT ANGIO HEAD NECK W WO CM  Result Date: 03/24/2022 CLINICAL DATA:  Provided history: Neuro deficit, acute, stroke suspected. Additional history provided: Intermittent numbness in right arm. Jerking and weakness in right arm. History of stroke. EXAM: CT ANGIOGRAPHY HEAD AND NECK TECHNIQUE: Multidetector CT imaging of the head and neck was performed using the standard protocol during bolus administration of intravenous contrast. Multiplanar CT image reconstructions and MIPs were obtained to evaluate the vascular anatomy. Carotid stenosis measurements (when applicable) are obtained utilizing NASCET  criteria, using the distal internal carotid diameter as the denominator. RADIATION DOSE REDUCTION: This exam was performed according to the departmental dose-optimization program which includes automated exposure control, adjustment of the mA and/or kV according to patient size and/or use of iterative reconstruction technique. CONTRAST:  723mOMNIPAQUE IOHEXOL 350 MG/ML SOLN COMPARISON:  Prior head CT examinations 11/23/2021 and earlier. Brain MRI 11/04/2016. CT angiogram head/neck 11/04/2016. Chest CT 05/19/2020. FINDINGS: CT HEAD FINDINGS Brain: Mild generalized cerebral atrophy. Multiple known small chronic cortically-based infarcts within the left frontal, parietal and occipital lobes, many of which were better appreciated on the prior brain MRI of 11/04/2016 (acute at that time). Background mild-to-moderate patchy and ill-defined hypoattenuation within the cerebral white matter nonspecific but compatible with chronic small vessel image disease. There is no acute intracranial hemorrhage. No acute demarcated cortical infarct is identified. No extra-axial fluid collection. No evidence of an intracranial mass. No midline shift. Vascular: No hyperdense vessel.  Atherosclerotic calcifications. Skull: No fracture or aggressive osseous lesion. Sinuses/Orbits: No mass or acute finding within the imaged orbits. Minimal mucosal thickening and fluid scattered within the bilateral ethmoid air cells. Trace mucosal thickening within the bilateral sphenoid sinuses. Review of the MIP images confirms the above findings CTA NECK FINDINGS  Aortic arch: Incompletely imaged ascending thoracic aortic aneurysm, not appreciably changed at the imaged levels as compared to the prior chest CT of 05/19/2020. 15 x 6 mm penetrating atherosclerotic ulcer along the left aspect of the aortic arch, somewhat changed in morphology but not significantly changed in size as compared to the prior CTA head/neck of 11/03/2016. Atherosclerotic plaque  within the visualized aortic arch and proximal major branch vessels of the neck. Redemonstrated high-grade stenosis within the mid left subclavian artery (of at least 80%). Streak and beam hardening artifact arising from a dense right-sided contrast bolus partially obscures the right subclavian artery. Within this limitation, there is no appreciable hemodynamically significant stenosis within the innominate or right subclavian arteries. Right carotid system: CCA and ICA patent within the neck without hemodynamically significant stenosis (50% or greater). Some soft plaque is present within the mid to distal CCA, about the carotid bifurcation and within the proximal ICA. Left carotid system: Prior left carotid endarterectomy. Soft plaque within the mid to distal CCA and within the proximal ICA and ECA. New from the prior examination of 11/03/2016, there is a 60-70% stenosis within the proximal ICA. Vertebral arteries: Vertebral arteries patent within the neck without significant stenosis. The left vertebral artery is dominant. Skeleton: Cervical spondylosis. No acute fracture or aggressive osseous lesion. Other neck: No neck mass or cervical lymphadenopathy. Upper chest: No consolidation within the imaged lung apices. Review of the MIP images confirms the above findings CTA HEAD FINDINGS Anterior circulation: The intracranial internal carotid arteries are patent. Atherosclerotic plaque within both vessels with no more than mild stenosis. The M1 middle cerebral arteries are patent. Atherosclerotic irregularity of the M2 and more distal MCA vessels bilaterally. No M2 proximal branch occlusion or high-grade proximal stenosis. The anterior cerebral arteries are patent. No intracranial aneurysm is identified. Posterior circulation: The non dominant right vertebral artery is developmentally diminutive intracranially and terminates predominantly as the right PICA. The dominant intracranial left vertebral artery is patent  without stenosis and supplies the basilar artery. The basilar artery is patent. The posterior cerebral arteries are patent. A right posterior communicating artery is present. The left posterior communicating artery is diminutive or absent. Venous sinuses: Within the limitations of contrast timing, no convincing thrombus. Anatomic variants: As described. Review of the MIP images confirms the above findings IMPRESSION: CT head: 1. No evidence of acute intracranial abnormality. 2. Known small chronic cortically-based infarcts within the left frontal, parietal and occipital lobes. 3. Background mild-to-moderate chronic small ischemic changes within the cerebral white matter. 4. Minimal bilateral ethmoid sinus disease. CTA neck: 1. Incompletely imaged ascending thoracic aortic aneurysm. The aneurysm has not changed in size at the imaged levels as compared to the prior chest CT of 05/19/2020. 2. 15 x 6 mm penetrating atherosclerotic ulcer along the left aspect of the aortic arch. This penetrating atherosclerotic ulcer has changed in morphology since the prior CTA of 11/04/2016, but has not significantly changed in size. 3. Redemonstrated severe stenosis within the mid left subclavian artery (at least 80%). 4. Sequelae of prior left carotid endarterectomy. New from the prior CTA of 11/04/2016, there is a 60-70% atherosclerotic stenosis within the proximal left ICA. 5. The right common carotid, right internal carotid and bilateral vertebral arteries are patent within the neck without hemodynamically significant stenosis. 6.  Aortic Atherosclerosis (ICD10-I70.0). CTA head: Intracranial atherosclerotic disease, as described. No intracranial large vessel occlusion or proximal high-grade arterial stenosis is identified. Electronically Signed   By: Kellie Simmering D.O.   On:  03/24/2022 13:48    PHYSICAL EXAM  Physical Exam  Constitutional: Appears well-developed and well-nourished.   Cardiovascular: Normal rate and regular  rhythm.  Respiratory: Effort normal, non-labored breathing  Neuro: Mental Status: Patient is awake, alert, oriented to person, place, month, year, and situation. Patient is able to give a clear and coherent history. No signs of aphasia or neglect Cranial Nerves: II: Visual Fields are full. Pupils are equal, round, and reactive to light.   III,IV, VI: EOMI without ptosis or diploplia.  V: Facial sensation is symmetric to temperature VII: Facial movement is symmetric resting and smiling VIII: Hearing is intact to voice X: Palate elevates symmetrically XI: Shoulder shrug is symmetric. XII: Tongue protrudes midline without atrophy or fasciculations.  Motor: Tone is normal. Bulk is normal. 5/5 strength was present in all four extremities.  Nearly continuous myoclonic tremor of the right upper extremity which is present at rest as well as with position holding and intention.  No tremor involving the lips, face right lower extremity Sensory: Sensation is symmetric to light touch and temperature in the arms and legs. Cerebellar: Right upper extremity myoclonic tremor     ASSESSMENT/PLAN Ms. JANETH TERRY is a 88 y.o. female with history of ignificant for stage III right ovarian cancer, history of CVA, ascending thoracic aortic aneurysm, penetrating ulcer of aorta, carotid artery disease s/p left CEA, HTN, HLD, previous loop recorder negative for afib who presented to the ED for evaluation of right arm weakness. On exam she has a right myoclonic appearing tremor. Keppra was initially started but it has not improved much. Depakote load and PO ordered.   Stroke:  Lacunar infarct in the left occipital lobe white matter Etiology: likely due to small vessel disease  Code Stroke CT head No acute abnormality. Small vessel disease. ASPECTS 10.    CTA head & neck Intracranial atherosclerotic disease; Redemonstrated severe stenosis within the mid left subclavian artery (at least 80%). Sequelae of  prior left carotid endarterectomy. New from the prior CTA of 11/04/2016, there is a 60-70% atherosclerotic stenosis within the proximal left ICA. MRI  Small acute infarct left occipital lobe. Chronic hemorrhagic infarcts in the left frontal lobe and left occipital parietal lobe. 2D Echo EF 57% EEG- This study is suggestive of cortical dysfunction in left hemisphere, maximal temporal region likely secondary to underlying strokes. No seizures or epileptiform discharges were seen throughout the recording.  LDL 111 HgbA1c No results found for requested labs within last 1095 days. VTE prophylaxis - Lovenox    Diet   Diet NPO time specified   aspirin 81 mg daily prior to admission, now on aspirin 81 mg daily and clopidogrel 75 mg daily and then plavix alone Therapy recommendations:  pending Disposition:  home   Hypertension Home meds:  Toprol- XL Stable Permissive hypertension (OK if < 220/120) but gradually normalize in 5-7 days Long-term BP goal normotensive  Hyperlipidemia Home meds:  Pravastatin '20mg'$  LDL 111, goal < 70  Other Stroke Risk Factors Advanced Age >/= 64   Other Active Problems Ovarian cancer On outpatient hospice  Declines further work up/outpatient follow up  Hospital day # 0  Patient seen and examined by NP/APP with MD. MD to update note as needed.   Janine Ores, DNP, FNP-BC [facility] Pager: [phone]  I have personally obtained history,examined this patient, reviewed notes, independently viewed imaging studies, participated in medical decision making and plan of care.ROS completed by me personally and pertinent positives fully documented  I  have made any additions or clarifications directly to the above note. Agree with note above.  Patient presented with sudden onset of right upper extremity tremor with myoclonic component with MRI showing small left parieto-occipital white matter lesion not sure this is responsible for it.  Keppra has not  had much effect hence we will switch to Depakote instead.  Recommend aspirin and Plavix for 3 weeks followed by Plavix alone and aggressive risk factor modification.  Mobilize out of bed.  Therapy consults.  Long discussion with patient and family member at the bedside and answered questions.  Greater than 50% time during this 50-minute visit was spent on counseling and coordination of care about her tremor and stroke and answering questions and discussion with care team.  Antony Contras, MD Medical Director [place] Pager: [phone] 03/25/2022 6:01 PM   To contact Stroke Continuity provider, please refer to [email or web address] After hours, contact General Neurology

## 2022-03-25 NOTE — Evaluation (Signed)
Occupational Therapy Evaluation/Discharge Patient Details Name: Regina James MRN: [id number] DOB: 1933-08-08 Today's Date: 03/25/2022   History of Present Illness Pt is an 88 y/o female who presented with R UE weakness. MRI brain showed small acute infarct L occipital lobe, chronic hemorrhagic infarcts in L frontal and L occipital parietal lobe. PMH: Stage 3 ovarian cancer, CVA, ascending thoracic aortic aneurysm, CAD, HTN, HLD.   Clinical Impression   PTA, pt lives alone, typically Independent with ADLs, basic IADLs and mobility. Pt currently active with hospice care. Pt presents now fairly close to reported baseline, able to mobilize in hallway with min guard only and IV pole for support. Pt denies any new visual deficits. Pt requires Supervision for majority of ADLs w/ minor limitations due to stretcher/ED room. Educated pt/daughter on when DME may be needed at home (cane vs Rollator if reaching out for support), energy conservation and discussing with hospice care when increased assist needed at home (showering tasks, etc). As pt fairly close to baseline, anticipate quick return to PLOF w/ continued mobility and hospice/family support as needed. If pt to remain admitted, would benefit from mobility specialists service. No further skilled OT services needed at this time.       Recommendations for follow up therapy are one component of a multi-disciplinary discharge planning process, led by the attending physician.  Recommendations may be updated based on patient status, additional functional criteria and insurance authorization.   Follow Up Recommendations  No OT follow up     Assistance Recommended at Discharge PRN  Patient can return home with the following      Functional Status Assessment  Patient has not had a recent decline in their functional status (fairly close to baseline)  Equipment Recommendations  None recommended by OT    Recommendations for Other Services        Precautions / Restrictions Precautions Precautions: Fall Restrictions Weight Bearing Restrictions: No      Mobility Bed Mobility Overal bed mobility: Modified Independent                  Transfers Overall transfer level: Needs assistance Equipment used: None Transfers: Sit to/from Stand Sit to Stand: Supervision                  Balance Overall balance assessment: Needs assistance Sitting-balance support: No upper extremity supported, Feet supported Sitting balance-Leahy Scale: Good     Standing balance support: No upper extremity supported, During functional activity, Single extremity supported Standing balance-Leahy Scale: Fair Standing balance comment: able to stand without support, one UE support on IV helpful for mobility                           ADL either performed or assessed with clinical judgement   ADL Overall ADL's : Needs assistance/impaired;At baseline Eating/Feeding: Independent   Grooming: Supervision/safety;Standing   Upper Body Bathing: Modified independent   Lower Body Bathing: Supervison/ safety   Upper Body Dressing : Modified independent   Lower Body Dressing: Minimal assistance Lower Body Dressing Details (indicate cue type and reason): assist for donning depends due to high stretcher. pt able to lift LEs, reach to feet functionally Toilet Transfer: Min guard;Ambulation   Toileting- Clothing Manipulation and Hygiene: Supervision/safety       Functional mobility during ADLs: Min guard General ADL Comments: Functioning fairly close to baseline, reports new tremors in hands (noted R UE with tremors) though able to ambulate in  hallway with IV pole and without LOB. Pt reports feeling fairly close to baseline     Vision Baseline Vision/History: 0 No visual deficits Ability to See in Adequate Light: 0 Adequate Patient Visual Report: No change from baseline Vision Assessment?: No apparent visual deficits Additional  Comments: denies any blurred, double vision or new concerns     Perception     Praxis      Pertinent Vitals/Pain Pain Assessment Pain Assessment: No/denies pain     Hand Dominance Right   Extremity/Trunk Assessment Upper Extremity Assessment Upper Extremity Assessment: Generalized weakness   Lower Extremity Assessment Lower Extremity Assessment: Defer to PT evaluation   Cervical / Trunk Assessment Cervical / Trunk Assessment: Normal   Communication Communication Communication: No difficulties   Cognition Arousal/Alertness: Awake/alert Behavior During Therapy: WFL for tasks assessed/performed Overall Cognitive Status: Within Functional Limits for tasks assessed                                 General Comments: WFL, did not formally assess     General Comments  Daughter present, supportive    Exercises     Shoulder Instructions      Home Living Family/patient expects to be discharged to:: Private residence Living Arrangements: Alone Available Help at Discharge: Family;Available 24 hours/day Type of Home: House Home Access: Stairs to enter [employer] of Steps: 3 Entrance Stairs-Rails: Right;Left Home Layout: Two level;Bed/bath upstairs Alternate Level Stairs-Number of Steps: 8, a landing and 4 more Alternate Level Stairs-Rails: Left Bathroom Shower/Tub: Occupational psychologist: Handicapped height Bathroom Accessibility: Yes   Home Equipment: [place] - single point;Shower seat - built in;Rollator (4 wheels);Grab bars - tub/shower          Prior Functioning/Environment Prior Level of Function : Independent/Modified Independent             Mobility Comments: typically no AD ADLs Comments: manages ADLs, household IADLs        OT Problem List: Decreased activity tolerance;Impaired balance (sitting and/or standing);Decreased strength      OT Treatment/Interventions:      OT Goals(Current goals can be found in the  care plan section) Acute Rehab OT Goals Patient Stated Goal: home today OT Goal Formulation: All assessment and education complete, DC therapy  OT Frequency:      Co-evaluation              AM-PAC OT "6 Clicks" Daily Activity     Outcome Measure Help from another person eating meals?: None Help from another person taking care of personal grooming?: None Help from another person toileting, which includes using toliet, bedpan, or urinal?: A Little Help from another person bathing (including washing, rinsing, drying)?: A Little Help from another person to put on and taking off regular upper body clothing?: None Help from another person to put on and taking off regular lower body clothing?: A Little 6 Click Score: 21   End of Session Equipment Utilized During Treatment: Gait belt  Activity Tolerance: Patient tolerated treatment well Patient left: in bed;with call bell/phone within reach;with family/visitor present  OT Visit Diagnosis: Muscle weakness (generalized) (M62.81)                Time: 5956-3875 OT Time Calculation (min): 17 min Charges:  OT General Charges $OT Visit: 1 Visit OT Evaluation $OT Eval Low Complexity: 1 Low  Malachy Chamber, OTR/L Acute Rehab Services Office: [phone]  Regina James 03/25/2022, 12:02 PM

## 2022-03-25 NOTE — Progress Notes (Signed)
EEG complete - results pending 

## 2022-03-25 NOTE — ED Notes (Signed)
Pt opened door, sat on the edge of the bed and yelled "nurse". When this RN asked about pt needs, pt yelled "well when were planning on helping me to the bathroom?". This RN reeducated patient about purpose of call bell and to request assistance instead of yelling. Pt was then helped to bathroom with x1 assist. Pt's bed and room were cleaned and pt placed into clean gown.

## 2022-03-25 NOTE — ED Notes (Signed)
This RN reviewed discharge instructions with patient and her family member. Both verbalized understanding and denied any further questions. Pt is well appearing upon discharge. PIV removed and pt took all belongings.

## 2022-03-25 NOTE — ED Notes (Signed)
Pt refusing vital signs at this time.    

## 2022-03-25 NOTE — ED Notes (Signed)
Family at bedside, requesting pt to go home.

## 2022-03-26 DIAGNOSIS — R109 Unspecified abdominal pain: Secondary | ICD-10-CM | POA: Diagnosis not present

## 2022-03-26 DIAGNOSIS — I69318 Other symptoms and signs involving cognitive functions following cerebral infarction: Secondary | ICD-10-CM | POA: Diagnosis not present

## 2022-03-26 DIAGNOSIS — I2510 Atherosclerotic heart disease of native coronary artery without angina pectoris: Secondary | ICD-10-CM | POA: Diagnosis not present

## 2022-03-26 DIAGNOSIS — R197 Diarrhea, unspecified: Secondary | ICD-10-CM | POA: Diagnosis not present

## 2022-03-26 DIAGNOSIS — R634 Abnormal weight loss: Secondary | ICD-10-CM | POA: Diagnosis not present

## 2022-03-26 DIAGNOSIS — C569 Malignant neoplasm of unspecified ovary: Secondary | ICD-10-CM | POA: Diagnosis not present

## 2022-03-26 LAB — HEMOGLOBIN A1C
Hgb A1c MFr Bld: 5.7 % — ABNORMAL HIGH (ref 4.8–5.6)
Mean Plasma Glucose: 117 mg/dL

## 2022-03-26 NOTE — Discharge Summary (Signed)
Physician Discharge Summary  Regina James YME:[id number] DOB: Apr 07, 1933 DOA: 03/24/2022  PCP: Kathyrn Lass, MD  Admit date: 03/24/2022 Discharge date: 03/25/2022  Admitted From: Home Disposition: Home with hospice  Recommendations for Outpatient Follow-up:  Follow up with PCP in 1 week Out patient follow up with Neurology Out patient follow up with home hospice Follow up in ED if symptoms worsen or new appear   Home Health: No Equipment/Devices: None  Discharge Condition: Poor CODE STATUS: DNR Diet recommendation: Heart healthy  Brief/Interim Summary: 88 y.o. female with medical history significant for stage III right ovarian cancer, history of CVA, ascending thoracic aortic aneurysm, penetrating ulcer of aorta, carotid artery disease s/p left CEA, HTN, HLD presents presented with right arm weakness along with jerking movement of her right hand and brief slurred speech.  On presentation, CT head was negative for acute intracranial abnormality.  CTA neck showed incompletely imaged descending thoracic aortic aneurysm, not changed in size compared to prior. 15.6 mm penetrating atherosclerotic ulcer along the left aspect of the aortic arch again noted, not significantly changed in size. Redemonstrated severe stenosis within the mid left subclavian artery (at least 80%) also seen. Prior left CEA with new 60-70% atherosclerotic stenosis within the proximal left ICA noted. Right common carotid, internal carotid, and bilateral vertebral arteries are patent.  CTA head negative for intracranial LVO or proximal high-grade arterial stenosis MRI brain showed small acute infarct of left occipital lobe.  Neurology was consulted and patient was started on IV Keppra.  EEG was negative for seizures. Patient was adamant to go home and resume home hospice. Neurology cleared her for discharge on aspirin, plavix and depakote. She will be discharged home today.    Discharge Diagnoses:   Acute CVA  (cerebrovascular accident)  -MRI brain shows small acute infarct in the left occipital lobe.  No mass lesion. -Neurology following -CTA head negative for LVO or proximal high-grade arterial stenosis -CTA neck with severe stenosis mid-left subclavian artery, 60-70% stenosis proximal left ICA -Had 2D echo on 02/01/2022.  Hence it was not repeated. -LDL 111.  Switch to Lipitor 80 mg nightly - Patient was adamant to go home and resume home hospice. Neurology cleared her for discharge on aspirin, plavix for 3 weeks followed by aspirin alone. Outpatient follow up with neurology if patient desires. She will be discharged home today.   Seizure-like activity  Continuous rhythmic jerking motion of the right hand.  Neurology following for focal seizure activity versus myoclonic jerks. -EEG was negative for seizures. -Neurology switched her to Depakote and cleared her for discharge.   Malignant neoplasm of ovary  Goals of care -Follows with oncology, Dr. Alvy Bimler.  Did not tolerate chemotherapy.  Active with home hospice. -Follow up with home hospice   Thoracic aortic aneurysm without rupture ([place]) -Stable appearance on CT.   Essential hypertension -BP stable.  Holding antihypertensives for at least 5-7 days.   Mixed dyslipidemia -Statin plan as above  Discharge Instructions  Discharge Instructions     Ambulatory referral to Neurology   Complete by: As directed    An appointment is requested in approximately: 2 weeks   Diet - low sodium heart healthy   Complete by: As directed    Increase activity slowly   Complete by: As directed       Allergies as of 03/25/2022       Reactions   Losartan Anaphylaxis   Per daughter and Patient, states that provider suggested she is having allergic reaction to  medication   Dorzolamide Hcl-timolol Mal Itching   Made vision very blurry and itching.Eyes very red.   Morphine And Related Swelling   Levofloxacin Anxiety, Other (See Comments)   Double  vision        Medication List     STOP taking these medications    metoprolol succinate 100 MG 24 hr tablet Commonly known as: TOPROL-XL   pravastatin 20 MG tablet Commonly known as: PRAVACHOL       TAKE these medications    aspirin EC 81 MG tablet Take 1 tablet (81 mg total) by mouth daily. For 3 weeks then stop What changed:  additional instructions Another medication with the same name was removed. Continue taking this medication, and follow the directions you see here.   atorvastatin 80 MG tablet Commonly known as: LIPITOR Take 1 tablet (80 mg total) by mouth at bedtime.   cholecalciferol 25 MCG (1000 UNIT) tablet Commonly known as: VITAMIN D3 Take 1,000 Units by mouth daily.   clopidogrel 75 MG tablet Commonly known as: PLAVIX Take 1 tablet (75 mg total) by mouth daily.   divalproex 500 MG 24 hr tablet Commonly known as: DEPAKOTE ER Take 1 tablet (500 mg total) by mouth daily.   latanoprost 0.005 % ophthalmic solution Commonly known as: XALATAN Place 1 drop into the left eye at bedtime.   PRESERVISION/LUTEIN PO Take by mouth.   Sleep Aid 25 MG tablet Generic drug: doxylamine (Sleep) Take 50 mg by mouth at bedtime.   Systane 0.4-0.3 % Soln Generic drug: Polyethyl Glycol-Propyl Glycol Place 1 drop into both eyes daily as needed for dry eyes. prn   timolol 0.5 % ophthalmic solution Commonly known as: TIMOPTIC Place 1 drop into the left eye 2 (two) times daily.        Allergies  Allergen Reactions   Losartan Anaphylaxis    Per daughter and Patient, states that provider suggested she is having allergic reaction to medication   Dorzolamide Hcl-Timolol Mal Itching    Made vision very blurry and itching.Eyes very red.   Morphine And Related Swelling   Levofloxacin Anxiety and Other (See Comments)    Double vision    Consultations: Neurology   Procedures/Studies: EEG adult  Result Date: 2022-04-13 Lora Havens, MD     04-13-2022   8:38 AM Patient Name: Regina James MRN: [id number] Epilepsy Attending: Lora Havens Referring Physician/Provider: Donnetta Simpers, MD Date: 03/24/2022 Duration: 25.48 mins Patient history: 88 year old female with right arm weakness and involuntary rhythmic jerking.  EEG to evaluate for seizure. Level of alertness: Awake, asleep AEDs during EEG study: LEV Technical aspects: This EEG study was done with scalp electrodes positioned according to the 10-20 International system of electrode placement. Electrical activity was reviewed with band pass filter of 1-'70Hz'$ , sensitivity of 7 uV/mm, display speed of 62m/sec with a '60Hz'$  notched filter applied as appropriate. EEG data were recorded continuously and digitally stored.  Video monitoring was available and reviewed as appropriate. Description: The posterior dominant rhythm consists of 8 Hz activity of moderate voltage (25-35 uV) seen predominantly in posterior head regions, symmetric and reactive to eye opening and eye closing. Sleep was characterized by vertex waves, sleep spindles (12 to 14 Hz), maximal frontocentral region. EEG also showed intermittent rhythmic 3 to 5 Hz theta-delta slowing in left hemisphere, maximal left temporal region.  Hyperventilation and photic stimulation were not performed.   ABNORMALITY - Intermittent slow, left hemisphere, maximal left temporal region IMPRESSION: This study is suggestive of  cortical dysfunction in left hemisphere, maximal temporal region likely secondary to underlying strokes. No seizures or epileptiform discharges were seen throughout the recording. Please note that lack of epileptiform activity does not exclude the diagnosis of epilepsy. Lora Havens   MR Brain W and Wo Contrast  Result Date: 03/24/2022 CLINICAL DATA:  Acute neuro deficit. EXAM: MRI HEAD WITHOUT AND WITH CONTRAST TECHNIQUE: Multiplanar, multiecho pulse sequences of the brain and surrounding structures were obtained without and with  intravenous contrast. CONTRAST:  32m GADAVIST GADOBUTROL 1 MMOL/ML IV SOLN COMPARISON:  CT head 03/24/2022 FINDINGS: Brain: Small acute infarct in the left lateral occipital lobe white matter. This measures approximately 5 x 10 mm. No other acute infarct Chronic hemorrhagic infarcts in the left frontal lobe and left occipital parietal lobe. There is mild vascular enhancement in the left occipital parietal lobe in the area of infarct. These may be collateral vessels. Chronic microvascular ischemic changes in the white matter. No mass lesion. Vascular: Normal arterial flow voids. Skull and upper cervical spine: No focal skeletal lesion. Sinuses/Orbits: Paranasal sinuses clear. Bilateral cataract extraction Other: None IMPRESSION: 1. Small acute infarct left occipital lobe. 2. Chronic hemorrhagic infarcts in the left frontal lobe and left occipital parietal lobe. 3. Chronic microvascular ischemic change in the white matter. Electronically Signed   By: CFranchot GalloM.D.   On: 03/24/2022 17:54   DG Chest 1 View  Result Date: 03/24/2022 CLINICAL DATA:  Right arm numbness EXAM: CHEST  1 VIEW COMPARISON:  Chest x-ray dated November 27, 2021 FINDINGS: Cardiac and mediastinal contours are unchanged. Right chest wall port is unchanged in position. Mild bibasilar opacities which are likely due to atelectasis. Lungs otherwise clear. No large pleural effusion or pneumothorax. IMPRESSION: Mild bibasilar opacities which are likely due to atelectasis. Electronically Signed   By: LYetta GlassmanM.D.   On: 03/24/2022 16:47   CT ANGIO HEAD NECK W WO CM  Result Date: 03/24/2022 CLINICAL DATA:  Provided history: Neuro deficit, acute, stroke suspected. Additional history provided: Intermittent numbness in right arm. Jerking and weakness in right arm. History of stroke. EXAM: CT ANGIOGRAPHY HEAD AND NECK TECHNIQUE: Multidetector CT imaging of the head and neck was performed using the standard protocol during bolus  administration of intravenous contrast. Multiplanar CT image reconstructions and MIPs were obtained to evaluate the vascular anatomy. Carotid stenosis measurements (when applicable) are obtained utilizing NASCET criteria, using the distal internal carotid diameter as the denominator. RADIATION DOSE REDUCTION: This exam was performed according to the departmental dose-optimization program which includes automated exposure control, adjustment of the mA and/or kV according to patient size and/or use of iterative reconstruction technique. CONTRAST:  73mOMNIPAQUE IOHEXOL 350 MG/ML SOLN COMPARISON:  Prior head CT examinations 11/23/2021 and earlier. Brain MRI 11/04/2016. CT angiogram head/neck 11/04/2016. Chest CT 05/19/2020. FINDINGS: CT HEAD FINDINGS Brain: Mild generalized cerebral atrophy. Multiple known small chronic cortically-based infarcts within the left frontal, parietal and occipital lobes, many of which were better appreciated on the prior brain MRI of 11/04/2016 (acute at that time). Background mild-to-moderate patchy and ill-defined hypoattenuation within the cerebral white matter nonspecific but compatible with chronic small vessel image disease. There is no acute intracranial hemorrhage. No acute demarcated cortical infarct is identified. No extra-axial fluid collection. No evidence of an intracranial mass. No midline shift. Vascular: No hyperdense vessel.  Atherosclerotic calcifications. Skull: No fracture or aggressive osseous lesion. Sinuses/Orbits: No mass or acute finding within the imaged orbits. Minimal mucosal thickening and fluid scattered within the  bilateral ethmoid air cells. Trace mucosal thickening within the bilateral sphenoid sinuses. Review of the MIP images confirms the above findings CTA NECK FINDINGS Aortic arch: Incompletely imaged ascending thoracic aortic aneurysm, not appreciably changed at the imaged levels as compared to the prior chest CT of 05/19/2020. 15 x 6 mm penetrating  atherosclerotic ulcer along the left aspect of the aortic arch, somewhat changed in morphology but not significantly changed in size as compared to the prior CTA head/neck of 11/03/2016. Atherosclerotic plaque within the visualized aortic arch and proximal major branch vessels of the neck. Redemonstrated high-grade stenosis within the mid left subclavian artery (of at least 80%). Streak and beam hardening artifact arising from a dense right-sided contrast bolus partially obscures the right subclavian artery. Within this limitation, there is no appreciable hemodynamically significant stenosis within the innominate or right subclavian arteries. Right carotid system: CCA and ICA patent within the neck without hemodynamically significant stenosis (50% or greater). Some soft plaque is present within the mid to distal CCA, about the carotid bifurcation and within the proximal ICA. Left carotid system: Prior left carotid endarterectomy. Soft plaque within the mid to distal CCA and within the proximal ICA and ECA. New from the prior examination of 11/03/2016, there is a 60-70% stenosis within the proximal ICA. Vertebral arteries: Vertebral arteries patent within the neck without significant stenosis. The left vertebral artery is dominant. Skeleton: Cervical spondylosis. No acute fracture or aggressive osseous lesion. Other neck: No neck mass or cervical lymphadenopathy. Upper chest: No consolidation within the imaged lung apices. Review of the MIP images confirms the above findings CTA HEAD FINDINGS Anterior circulation: The intracranial internal carotid arteries are patent. Atherosclerotic plaque within both vessels with no more than mild stenosis. The M1 middle cerebral arteries are patent. Atherosclerotic irregularity of the M2 and more distal MCA vessels bilaterally. No M2 proximal branch occlusion or high-grade proximal stenosis. The anterior cerebral arteries are patent. No intracranial aneurysm is identified.  Posterior circulation: The non dominant right vertebral artery is developmentally diminutive intracranially and terminates predominantly as the right PICA. The dominant intracranial left vertebral artery is patent without stenosis and supplies the basilar artery. The basilar artery is patent. The posterior cerebral arteries are patent. A right posterior communicating artery is present. The left posterior communicating artery is diminutive or absent. Venous sinuses: Within the limitations of contrast timing, no convincing thrombus. Anatomic variants: As described. Review of the MIP images confirms the above findings IMPRESSION: CT head: 1. No evidence of acute intracranial abnormality. 2. Known small chronic cortically-based infarcts within the left frontal, parietal and occipital lobes. 3. Background mild-to-moderate chronic small ischemic changes within the cerebral white matter. 4. Minimal bilateral ethmoid sinus disease. CTA neck: 1. Incompletely imaged ascending thoracic aortic aneurysm. The aneurysm has not changed in size at the imaged levels as compared to the prior chest CT of 05/19/2020. 2. 15 x 6 mm penetrating atherosclerotic ulcer along the left aspect of the aortic arch. This penetrating atherosclerotic ulcer has changed in morphology since the prior CTA of 11/04/2016, but has not significantly changed in size. 3. Redemonstrated severe stenosis within the mid left subclavian artery (at least 80%). 4. Sequelae of prior left carotid endarterectomy. New from the prior CTA of 11/04/2016, there is a 60-70% atherosclerotic stenosis within the proximal left ICA. 5. The right common carotid, right internal carotid and bilateral vertebral arteries are patent within the neck without hemodynamically significant stenosis. 6.  Aortic Atherosclerosis (ICD10-I70.0). CTA head: Intracranial atherosclerotic disease, as described.  No intracranial large vessel occlusion or proximal high-grade arterial stenosis is  identified. Electronically Signed   By: Kellie Simmering D.O.   On: 03/24/2022 13:48   VAS [place] LOWER EXTREMITY VENOUS (DVT)  Result Date: 03/03/2022  Lower Venous DVT Study Patient Name:  Regina James  Date of Exam:   03/03/2022 Medical Rec #: [id number]        Accession #:    [id number] Date of Birth: Feb 03, 1934        Patient Gender: F Patient Age:   75 years Exam Location:  [facility] Procedure:      VAS [place] LOWER EXTREMITY VENOUS (DVT) Referring Phys: Lattie Haw MILLER --------------------------------------------------------------------------------  Indications: Swelling, and Edema.  Comparison Study: 06/28/16 prior Performing Technologist: Archie Patten RVS  Examination Guidelines: A complete evaluation includes B-mode imaging, spectral Doppler, color Doppler, and power Doppler as needed of all accessible portions of each vessel. Bilateral testing is considered an integral part of a complete examination. Limited examinations for reoccurring indications may be performed as noted. The reflux portion of the exam is performed with the patient in reverse Trendelenburg.  +-----+---------------+---------+-----------+----------+--------------+ RIGHTCompressibilityPhasicitySpontaneityPropertiesThrombus Aging +-----+---------------+---------+-----------+----------+--------------+ CFV  Full           Yes      Yes                                 +-----+---------------+---------+-----------+----------+--------------+   +---------+---------------+---------+-----------+----------+--------------+ LEFT     CompressibilityPhasicitySpontaneityPropertiesThrombus Aging +---------+---------------+---------+-----------+----------+--------------+ CFV      Full           Yes      Yes                                 +---------+---------------+---------+-----------+----------+--------------+ SFJ      Full                                                         +---------+---------------+---------+-----------+----------+--------------+ FV Prox  Full                                                        +---------+---------------+---------+-----------+----------+--------------+ FV Mid   Full                                                        +---------+---------------+---------+-----------+----------+--------------+ FV DistalFull                                                        +---------+---------------+---------+-----------+----------+--------------+ PFV      Full                                                        +---------+---------------+---------+-----------+----------+--------------+  POP      Full           Yes      Yes                                 +---------+---------------+---------+-----------+----------+--------------+ PTV      Full                                                        +---------+---------------+---------+-----------+----------+--------------+ PERO     Full           Yes      Yes                                 +---------+---------------+---------+-----------+----------+--------------+     Summary: RIGHT: - No evidence of common femoral vein obstruction.  LEFT: - There is no evidence of deep vein thrombosis in the lower extremity.  - No cystic structure found in the popliteal fossa.  *See table(s) above for measurements and observations. Electronically signed by Harold Barban MD on 03/03/2022 at 7:46:57 PM.    Final       Subjective: Patient seen and examined at bedside.  Poor historian, slow to respond.  Answering some questions still complaining of right hand jerking movements.  No fever, agitation or vomiting reported.   Discharge Exam: Vitals:   03/25/22 0417 03/25/22 0823  BP:  (!) 110/43  Pulse:  81  Resp:  16  Temp: 98.2 F (36.8 C) 98.2 F (36.8 C)  SpO2:  96%    General exam: Appears calm and comfortable.  Looks chronically ill and deconditioned.  On  room air. Respiratory system: Bilateral decreased breath sounds at bases with scattered crackles Cardiovascular system: S1 & S2 heard, Rate controlled Gastrointestinal system: Abdomen is nondistended, soft and nontender. Normal bowel sounds heard. Extremities: No cyanosis, clubbing, edema  Central nervous system: Awake, slow to respond, answering some questions.  Poor historian.  No focal neurological deficits.  Right upper extremity intermittent jerking movements noted  skin: No rashes, lesions or ulcers Psychiatry: Flat affect.  Not agitated.    The results of significant diagnostics from this hospitalization (including imaging, microbiology, ancillary and laboratory) are listed below for reference.     Microbiology: No results found for this or any previous visit (from the past 240 hour(s)).   Labs: BNP (last 3 results) No results for input(s): "BNP" in the last 8760 hours. Basic Metabolic Panel: Recent Labs  Lab 03/24/22 1114 03/24/22 1123 03/25/22 0030  NA 140 139 140  K 4.2 5.0 3.2*  CL 104 103 104  CO2 26  --  27  GLUCOSE 91 95 117*  BUN 7* 8 6*  CREATININE 0.71 0.60 0.79  CALCIUM 8.7*  --  8.8*   Liver Function Tests: Recent Labs  Lab 03/24/22 1114  AST 27  ALT 12  ALKPHOS 73  BILITOT 0.7  PROT 6.3*  ALBUMIN 3.0*   No results for input(s): "LIPASE", "AMYLASE" in the last 168 hours. Recent Labs  Lab 03/24/22 1855  AMMONIA 21   CBC: Recent Labs  Lab 03/24/22 1114 03/24/22 1123 03/25/22 0030  WBC 8.9  --  8.5  NEUTROABS 6.1  --   --  HGB 12.8 13.6 12.4  HCT 39.6 40.0 37.2  MCV 92.3  --  90.3  PLT 481*  --  448*   Cardiac Enzymes: No results for input(s): "CKTOTAL", "CKMB", "CKMBINDEX", "TROPONINI" in the last 168 hours. BNP: Invalid input(s): "POCBNP" CBG: Recent Labs  Lab 03/24/22 1047  GLUCAP 102*   D-Dimer No results for input(s): "DDIMER" in the last 72 hours. Hgb A1c Recent Labs    03/25/22 0030  HGBA1C 5.7*   Lipid  Profile Recent Labs    03/25/22 0050  CHOL 173  HDL 25*  LDLCALC 111*  TRIG 187*  CHOLHDL 6.9   Thyroid function studies No results for input(s): "TSH", "T4TOTAL", "T3FREE", "THYROIDAB" in the last 72 hours.  Invalid input(s): "FREET3" Anemia work up No results for input(s): "VITAMINB12", "FOLATE", "FERRITIN", "TIBC", "IRON", "RETICCTPCT" in the last 72 hours. Urinalysis    Component Value Date/Time   COLORURINE YELLOW 03/24/2022 2325   APPEARANCEUR HAZY (A) 03/24/2022 2325   LABSPEC >1.046 (H) 03/24/2022 2325   PHURINE 6.0 03/24/2022 2325   GLUCOSEU NEGATIVE 03/24/2022 2325   HGBUR NEGATIVE 03/24/2022 2325   BILIRUBINUR NEGATIVE 03/24/2022 2325   KETONESUR 20 (A) 03/24/2022 2325   PROTEINUR 30 (A) 03/24/2022 2325   UROBILINOGEN 1.0 03/21/2007 1822   NITRITE NEGATIVE 03/24/2022 2325   LEUKOCYTESUR NEGATIVE 03/24/2022 2325   Sepsis Labs Recent Labs  Lab 03/24/22 1114 03/25/22 0030  WBC 8.9 8.5   Microbiology No results found for this or any previous visit (from the past 240 hour(s)).   Time coordinating discharge: 35 minutes  SIGNED:   Aline August, MD  [facility] 03/26/2022, 4:48 PM

## 2022-04-01 DIAGNOSIS — I69318 Other symptoms and signs involving cognitive functions following cerebral infarction: Secondary | ICD-10-CM | POA: Diagnosis not present

## 2022-04-01 DIAGNOSIS — R197 Diarrhea, unspecified: Secondary | ICD-10-CM | POA: Diagnosis not present

## 2022-04-01 DIAGNOSIS — R634 Abnormal weight loss: Secondary | ICD-10-CM | POA: Diagnosis not present

## 2022-04-01 DIAGNOSIS — R109 Unspecified abdominal pain: Secondary | ICD-10-CM | POA: Diagnosis not present

## 2022-04-01 DIAGNOSIS — I2510 Atherosclerotic heart disease of native coronary artery without angina pectoris: Secondary | ICD-10-CM | POA: Diagnosis not present

## 2022-04-01 DIAGNOSIS — C569 Malignant neoplasm of unspecified ovary: Secondary | ICD-10-CM | POA: Diagnosis not present

## 2022-04-05 DIAGNOSIS — I719 Aortic aneurysm of unspecified site, without rupture: Secondary | ICD-10-CM | POA: Diagnosis not present

## 2022-04-05 DIAGNOSIS — I10 Essential (primary) hypertension: Secondary | ICD-10-CM | POA: Diagnosis not present

## 2022-04-05 DIAGNOSIS — C569 Malignant neoplasm of unspecified ovary: Secondary | ICD-10-CM | POA: Diagnosis not present

## 2022-04-05 DIAGNOSIS — I2510 Atherosclerotic heart disease of native coronary artery without angina pectoris: Secondary | ICD-10-CM | POA: Diagnosis not present

## 2022-04-05 DIAGNOSIS — I051 Rheumatic mitral insufficiency: Secondary | ICD-10-CM | POA: Diagnosis not present

## 2022-04-05 DIAGNOSIS — I69318 Other symptoms and signs involving cognitive functions following cerebral infarction: Secondary | ICD-10-CM | POA: Diagnosis not present

## 2022-04-05 DIAGNOSIS — R634 Abnormal weight loss: Secondary | ICD-10-CM | POA: Diagnosis not present

## 2022-04-05 DIAGNOSIS — E785 Hyperlipidemia, unspecified: Secondary | ICD-10-CM | POA: Diagnosis not present

## 2022-04-05 DIAGNOSIS — H409 Unspecified glaucoma: Secondary | ICD-10-CM | POA: Diagnosis not present

## 2022-04-05 DIAGNOSIS — R197 Diarrhea, unspecified: Secondary | ICD-10-CM | POA: Diagnosis not present

## 2022-04-05 DIAGNOSIS — I716 Thoracoabdominal aortic aneurysm, without rupture, unspecified: Secondary | ICD-10-CM | POA: Diagnosis not present

## 2022-04-05 DIAGNOSIS — R109 Unspecified abdominal pain: Secondary | ICD-10-CM | POA: Diagnosis not present

## 2022-04-08 DIAGNOSIS — I2510 Atherosclerotic heart disease of native coronary artery without angina pectoris: Secondary | ICD-10-CM | POA: Diagnosis not present

## 2022-04-08 DIAGNOSIS — R109 Unspecified abdominal pain: Secondary | ICD-10-CM | POA: Diagnosis not present

## 2022-04-08 DIAGNOSIS — R634 Abnormal weight loss: Secondary | ICD-10-CM | POA: Diagnosis not present

## 2022-04-08 DIAGNOSIS — R197 Diarrhea, unspecified: Secondary | ICD-10-CM | POA: Diagnosis not present

## 2022-04-08 DIAGNOSIS — I69318 Other symptoms and signs involving cognitive functions following cerebral infarction: Secondary | ICD-10-CM | POA: Diagnosis not present

## 2022-04-08 DIAGNOSIS — C569 Malignant neoplasm of unspecified ovary: Secondary | ICD-10-CM | POA: Diagnosis not present

## 2022-04-13 DIAGNOSIS — R634 Abnormal weight loss: Secondary | ICD-10-CM | POA: Diagnosis not present

## 2022-04-13 DIAGNOSIS — C569 Malignant neoplasm of unspecified ovary: Secondary | ICD-10-CM | POA: Diagnosis not present

## 2022-04-13 DIAGNOSIS — R109 Unspecified abdominal pain: Secondary | ICD-10-CM | POA: Diagnosis not present

## 2022-04-13 DIAGNOSIS — R197 Diarrhea, unspecified: Secondary | ICD-10-CM | POA: Diagnosis not present

## 2022-04-13 DIAGNOSIS — I2510 Atherosclerotic heart disease of native coronary artery without angina pectoris: Secondary | ICD-10-CM | POA: Diagnosis not present

## 2022-04-13 DIAGNOSIS — I69318 Other symptoms and signs involving cognitive functions following cerebral infarction: Secondary | ICD-10-CM | POA: Diagnosis not present

## 2022-04-15 DIAGNOSIS — R197 Diarrhea, unspecified: Secondary | ICD-10-CM | POA: Diagnosis not present

## 2022-04-15 DIAGNOSIS — I2510 Atherosclerotic heart disease of native coronary artery without angina pectoris: Secondary | ICD-10-CM | POA: Diagnosis not present

## 2022-04-15 DIAGNOSIS — R109 Unspecified abdominal pain: Secondary | ICD-10-CM | POA: Diagnosis not present

## 2022-04-15 DIAGNOSIS — C569 Malignant neoplasm of unspecified ovary: Secondary | ICD-10-CM | POA: Diagnosis not present

## 2022-04-15 DIAGNOSIS — R634 Abnormal weight loss: Secondary | ICD-10-CM | POA: Diagnosis not present

## 2022-04-15 DIAGNOSIS — I69318 Other symptoms and signs involving cognitive functions following cerebral infarction: Secondary | ICD-10-CM | POA: Diagnosis not present

## 2022-04-16 DIAGNOSIS — I69318 Other symptoms and signs involving cognitive functions following cerebral infarction: Secondary | ICD-10-CM | POA: Diagnosis not present

## 2022-04-16 DIAGNOSIS — C569 Malignant neoplasm of unspecified ovary: Secondary | ICD-10-CM | POA: Diagnosis not present

## 2022-04-16 DIAGNOSIS — R197 Diarrhea, unspecified: Secondary | ICD-10-CM | POA: Diagnosis not present

## 2022-04-16 DIAGNOSIS — I2510 Atherosclerotic heart disease of native coronary artery without angina pectoris: Secondary | ICD-10-CM | POA: Diagnosis not present

## 2022-04-16 DIAGNOSIS — R634 Abnormal weight loss: Secondary | ICD-10-CM | POA: Diagnosis not present

## 2022-04-16 DIAGNOSIS — R109 Unspecified abdominal pain: Secondary | ICD-10-CM | POA: Diagnosis not present

## 2022-04-20 DIAGNOSIS — C569 Malignant neoplasm of unspecified ovary: Secondary | ICD-10-CM | POA: Diagnosis not present

## 2022-04-20 DIAGNOSIS — R109 Unspecified abdominal pain: Secondary | ICD-10-CM | POA: Diagnosis not present

## 2022-04-20 DIAGNOSIS — I2510 Atherosclerotic heart disease of native coronary artery without angina pectoris: Secondary | ICD-10-CM | POA: Diagnosis not present

## 2022-04-20 DIAGNOSIS — R634 Abnormal weight loss: Secondary | ICD-10-CM | POA: Diagnosis not present

## 2022-04-20 DIAGNOSIS — R197 Diarrhea, unspecified: Secondary | ICD-10-CM | POA: Diagnosis not present

## 2022-04-20 DIAGNOSIS — I69318 Other symptoms and signs involving cognitive functions following cerebral infarction: Secondary | ICD-10-CM | POA: Diagnosis not present

## 2022-04-23 DIAGNOSIS — C569 Malignant neoplasm of unspecified ovary: Secondary | ICD-10-CM | POA: Diagnosis not present

## 2022-04-23 DIAGNOSIS — I69318 Other symptoms and signs involving cognitive functions following cerebral infarction: Secondary | ICD-10-CM | POA: Diagnosis not present

## 2022-04-23 DIAGNOSIS — R634 Abnormal weight loss: Secondary | ICD-10-CM | POA: Diagnosis not present

## 2022-04-23 DIAGNOSIS — R197 Diarrhea, unspecified: Secondary | ICD-10-CM | POA: Diagnosis not present

## 2022-04-23 DIAGNOSIS — R109 Unspecified abdominal pain: Secondary | ICD-10-CM | POA: Diagnosis not present

## 2022-04-23 DIAGNOSIS — I2510 Atherosclerotic heart disease of native coronary artery without angina pectoris: Secondary | ICD-10-CM | POA: Diagnosis not present

## 2022-04-26 DIAGNOSIS — R197 Diarrhea, unspecified: Secondary | ICD-10-CM | POA: Diagnosis not present

## 2022-04-26 DIAGNOSIS — I69318 Other symptoms and signs involving cognitive functions following cerebral infarction: Secondary | ICD-10-CM | POA: Diagnosis not present

## 2022-04-26 DIAGNOSIS — R634 Abnormal weight loss: Secondary | ICD-10-CM | POA: Diagnosis not present

## 2022-04-26 DIAGNOSIS — C569 Malignant neoplasm of unspecified ovary: Secondary | ICD-10-CM | POA: Diagnosis not present

## 2022-04-26 DIAGNOSIS — R109 Unspecified abdominal pain: Secondary | ICD-10-CM | POA: Diagnosis not present

## 2022-04-26 DIAGNOSIS — I2510 Atherosclerotic heart disease of native coronary artery without angina pectoris: Secondary | ICD-10-CM | POA: Diagnosis not present

## 2022-04-27 DIAGNOSIS — I2510 Atherosclerotic heart disease of native coronary artery without angina pectoris: Secondary | ICD-10-CM | POA: Diagnosis not present

## 2022-04-27 DIAGNOSIS — R634 Abnormal weight loss: Secondary | ICD-10-CM | POA: Diagnosis not present

## 2022-04-27 DIAGNOSIS — C569 Malignant neoplasm of unspecified ovary: Secondary | ICD-10-CM | POA: Diagnosis not present

## 2022-04-27 DIAGNOSIS — R197 Diarrhea, unspecified: Secondary | ICD-10-CM | POA: Diagnosis not present

## 2022-04-27 DIAGNOSIS — R109 Unspecified abdominal pain: Secondary | ICD-10-CM | POA: Diagnosis not present

## 2022-04-27 DIAGNOSIS — I69318 Other symptoms and signs involving cognitive functions following cerebral infarction: Secondary | ICD-10-CM | POA: Diagnosis not present

## 2022-04-30 DIAGNOSIS — R109 Unspecified abdominal pain: Secondary | ICD-10-CM | POA: Diagnosis not present

## 2022-04-30 DIAGNOSIS — R634 Abnormal weight loss: Secondary | ICD-10-CM | POA: Diagnosis not present

## 2022-04-30 DIAGNOSIS — I2510 Atherosclerotic heart disease of native coronary artery without angina pectoris: Secondary | ICD-10-CM | POA: Diagnosis not present

## 2022-04-30 DIAGNOSIS — C569 Malignant neoplasm of unspecified ovary: Secondary | ICD-10-CM | POA: Diagnosis not present

## 2022-04-30 DIAGNOSIS — R197 Diarrhea, unspecified: Secondary | ICD-10-CM | POA: Diagnosis not present

## 2022-04-30 DIAGNOSIS — I69318 Other symptoms and signs involving cognitive functions following cerebral infarction: Secondary | ICD-10-CM | POA: Diagnosis not present

## 2022-05-04 DIAGNOSIS — R197 Diarrhea, unspecified: Secondary | ICD-10-CM | POA: Diagnosis not present

## 2022-05-04 DIAGNOSIS — I2510 Atherosclerotic heart disease of native coronary artery without angina pectoris: Secondary | ICD-10-CM | POA: Diagnosis not present

## 2022-05-04 DIAGNOSIS — R634 Abnormal weight loss: Secondary | ICD-10-CM | POA: Diagnosis not present

## 2022-05-04 DIAGNOSIS — I69318 Other symptoms and signs involving cognitive functions following cerebral infarction: Secondary | ICD-10-CM | POA: Diagnosis not present

## 2022-05-04 DIAGNOSIS — C569 Malignant neoplasm of unspecified ovary: Secondary | ICD-10-CM | POA: Diagnosis not present

## 2022-05-04 DIAGNOSIS — R109 Unspecified abdominal pain: Secondary | ICD-10-CM | POA: Diagnosis not present

## 2022-05-06 ENCOUNTER — Encounter: Payer: Self-pay | Admitting: Gynecologic Oncology

## 2022-05-06 DIAGNOSIS — I719 Aortic aneurysm of unspecified site, without rupture: Secondary | ICD-10-CM | POA: Diagnosis not present

## 2022-05-06 DIAGNOSIS — C569 Malignant neoplasm of unspecified ovary: Secondary | ICD-10-CM | POA: Diagnosis not present

## 2022-05-06 DIAGNOSIS — I10 Essential (primary) hypertension: Secondary | ICD-10-CM | POA: Diagnosis not present

## 2022-05-06 DIAGNOSIS — I2510 Atherosclerotic heart disease of native coronary artery without angina pectoris: Secondary | ICD-10-CM | POA: Diagnosis not present

## 2022-05-06 DIAGNOSIS — I69318 Other symptoms and signs involving cognitive functions following cerebral infarction: Secondary | ICD-10-CM | POA: Diagnosis not present

## 2022-05-06 DIAGNOSIS — R634 Abnormal weight loss: Secondary | ICD-10-CM | POA: Diagnosis not present

## 2022-05-06 DIAGNOSIS — H409 Unspecified glaucoma: Secondary | ICD-10-CM | POA: Diagnosis not present

## 2022-05-06 DIAGNOSIS — E785 Hyperlipidemia, unspecified: Secondary | ICD-10-CM | POA: Diagnosis not present

## 2022-05-06 DIAGNOSIS — I051 Rheumatic mitral insufficiency: Secondary | ICD-10-CM | POA: Diagnosis not present

## 2022-05-06 DIAGNOSIS — I716 Thoracoabdominal aortic aneurysm, without rupture, unspecified: Secondary | ICD-10-CM | POA: Diagnosis not present

## 2022-05-06 DIAGNOSIS — R197 Diarrhea, unspecified: Secondary | ICD-10-CM | POA: Diagnosis not present

## 2022-05-06 DIAGNOSIS — R109 Unspecified abdominal pain: Secondary | ICD-10-CM | POA: Diagnosis not present

## 2022-05-07 DIAGNOSIS — C569 Malignant neoplasm of unspecified ovary: Secondary | ICD-10-CM | POA: Diagnosis not present

## 2022-05-07 DIAGNOSIS — R197 Diarrhea, unspecified: Secondary | ICD-10-CM | POA: Diagnosis not present

## 2022-05-07 DIAGNOSIS — R634 Abnormal weight loss: Secondary | ICD-10-CM | POA: Diagnosis not present

## 2022-05-07 DIAGNOSIS — I69318 Other symptoms and signs involving cognitive functions following cerebral infarction: Secondary | ICD-10-CM | POA: Diagnosis not present

## 2022-05-07 DIAGNOSIS — R109 Unspecified abdominal pain: Secondary | ICD-10-CM | POA: Diagnosis not present

## 2022-05-07 DIAGNOSIS — I2510 Atherosclerotic heart disease of native coronary artery without angina pectoris: Secondary | ICD-10-CM | POA: Diagnosis not present

## 2022-05-10 DIAGNOSIS — R634 Abnormal weight loss: Secondary | ICD-10-CM | POA: Diagnosis not present

## 2022-05-10 DIAGNOSIS — I69318 Other symptoms and signs involving cognitive functions following cerebral infarction: Secondary | ICD-10-CM | POA: Diagnosis not present

## 2022-05-10 DIAGNOSIS — R109 Unspecified abdominal pain: Secondary | ICD-10-CM | POA: Diagnosis not present

## 2022-05-10 DIAGNOSIS — I2510 Atherosclerotic heart disease of native coronary artery without angina pectoris: Secondary | ICD-10-CM | POA: Diagnosis not present

## 2022-05-10 DIAGNOSIS — C569 Malignant neoplasm of unspecified ovary: Secondary | ICD-10-CM | POA: Diagnosis not present

## 2022-05-10 DIAGNOSIS — R197 Diarrhea, unspecified: Secondary | ICD-10-CM | POA: Diagnosis not present

## 2022-05-11 DIAGNOSIS — R197 Diarrhea, unspecified: Secondary | ICD-10-CM | POA: Diagnosis not present

## 2022-05-11 DIAGNOSIS — R109 Unspecified abdominal pain: Secondary | ICD-10-CM | POA: Diagnosis not present

## 2022-05-11 DIAGNOSIS — C569 Malignant neoplasm of unspecified ovary: Secondary | ICD-10-CM | POA: Diagnosis not present

## 2022-05-11 DIAGNOSIS — I2510 Atherosclerotic heart disease of native coronary artery without angina pectoris: Secondary | ICD-10-CM | POA: Diagnosis not present

## 2022-05-11 DIAGNOSIS — R634 Abnormal weight loss: Secondary | ICD-10-CM | POA: Diagnosis not present

## 2022-05-11 DIAGNOSIS — I69318 Other symptoms and signs involving cognitive functions following cerebral infarction: Secondary | ICD-10-CM | POA: Diagnosis not present

## 2022-05-14 DIAGNOSIS — I2510 Atherosclerotic heart disease of native coronary artery without angina pectoris: Secondary | ICD-10-CM | POA: Diagnosis not present

## 2022-05-14 DIAGNOSIS — R634 Abnormal weight loss: Secondary | ICD-10-CM | POA: Diagnosis not present

## 2022-05-14 DIAGNOSIS — R197 Diarrhea, unspecified: Secondary | ICD-10-CM | POA: Diagnosis not present

## 2022-05-14 DIAGNOSIS — R109 Unspecified abdominal pain: Secondary | ICD-10-CM | POA: Diagnosis not present

## 2022-05-14 DIAGNOSIS — C569 Malignant neoplasm of unspecified ovary: Secondary | ICD-10-CM | POA: Diagnosis not present

## 2022-05-14 DIAGNOSIS — I69318 Other symptoms and signs involving cognitive functions following cerebral infarction: Secondary | ICD-10-CM | POA: Diagnosis not present

## 2022-05-18 DIAGNOSIS — R197 Diarrhea, unspecified: Secondary | ICD-10-CM | POA: Diagnosis not present

## 2022-05-18 DIAGNOSIS — C569 Malignant neoplasm of unspecified ovary: Secondary | ICD-10-CM | POA: Diagnosis not present

## 2022-05-18 DIAGNOSIS — I2510 Atherosclerotic heart disease of native coronary artery without angina pectoris: Secondary | ICD-10-CM | POA: Diagnosis not present

## 2022-05-18 DIAGNOSIS — R109 Unspecified abdominal pain: Secondary | ICD-10-CM | POA: Diagnosis not present

## 2022-05-18 DIAGNOSIS — R634 Abnormal weight loss: Secondary | ICD-10-CM | POA: Diagnosis not present

## 2022-05-18 DIAGNOSIS — I69318 Other symptoms and signs involving cognitive functions following cerebral infarction: Secondary | ICD-10-CM | POA: Diagnosis not present

## 2022-05-21 DIAGNOSIS — C569 Malignant neoplasm of unspecified ovary: Secondary | ICD-10-CM | POA: Diagnosis not present

## 2022-05-21 DIAGNOSIS — R109 Unspecified abdominal pain: Secondary | ICD-10-CM | POA: Diagnosis not present

## 2022-05-21 DIAGNOSIS — I2510 Atherosclerotic heart disease of native coronary artery without angina pectoris: Secondary | ICD-10-CM | POA: Diagnosis not present

## 2022-05-21 DIAGNOSIS — R197 Diarrhea, unspecified: Secondary | ICD-10-CM | POA: Diagnosis not present

## 2022-05-21 DIAGNOSIS — R634 Abnormal weight loss: Secondary | ICD-10-CM | POA: Diagnosis not present

## 2022-05-21 DIAGNOSIS — I69318 Other symptoms and signs involving cognitive functions following cerebral infarction: Secondary | ICD-10-CM | POA: Diagnosis not present

## 2022-05-24 DIAGNOSIS — C569 Malignant neoplasm of unspecified ovary: Secondary | ICD-10-CM | POA: Diagnosis not present

## 2022-05-24 DIAGNOSIS — R109 Unspecified abdominal pain: Secondary | ICD-10-CM | POA: Diagnosis not present

## 2022-05-24 DIAGNOSIS — R197 Diarrhea, unspecified: Secondary | ICD-10-CM | POA: Diagnosis not present

## 2022-05-24 DIAGNOSIS — I69318 Other symptoms and signs involving cognitive functions following cerebral infarction: Secondary | ICD-10-CM | POA: Diagnosis not present

## 2022-05-24 DIAGNOSIS — I2510 Atherosclerotic heart disease of native coronary artery without angina pectoris: Secondary | ICD-10-CM | POA: Diagnosis not present

## 2022-05-24 DIAGNOSIS — R634 Abnormal weight loss: Secondary | ICD-10-CM | POA: Diagnosis not present

## 2022-05-25 DIAGNOSIS — I69318 Other symptoms and signs involving cognitive functions following cerebral infarction: Secondary | ICD-10-CM | POA: Diagnosis not present

## 2022-05-25 DIAGNOSIS — C569 Malignant neoplasm of unspecified ovary: Secondary | ICD-10-CM | POA: Diagnosis not present

## 2022-05-25 DIAGNOSIS — I2510 Atherosclerotic heart disease of native coronary artery without angina pectoris: Secondary | ICD-10-CM | POA: Diagnosis not present

## 2022-05-25 DIAGNOSIS — R634 Abnormal weight loss: Secondary | ICD-10-CM | POA: Diagnosis not present

## 2022-05-25 DIAGNOSIS — R109 Unspecified abdominal pain: Secondary | ICD-10-CM | POA: Diagnosis not present

## 2022-05-25 DIAGNOSIS — R197 Diarrhea, unspecified: Secondary | ICD-10-CM | POA: Diagnosis not present

## 2022-05-27 DIAGNOSIS — I69318 Other symptoms and signs involving cognitive functions following cerebral infarction: Secondary | ICD-10-CM | POA: Diagnosis not present

## 2022-05-27 DIAGNOSIS — C569 Malignant neoplasm of unspecified ovary: Secondary | ICD-10-CM | POA: Diagnosis not present

## 2022-05-27 DIAGNOSIS — R634 Abnormal weight loss: Secondary | ICD-10-CM | POA: Diagnosis not present

## 2022-05-27 DIAGNOSIS — I2510 Atherosclerotic heart disease of native coronary artery without angina pectoris: Secondary | ICD-10-CM | POA: Diagnosis not present

## 2022-05-27 DIAGNOSIS — R197 Diarrhea, unspecified: Secondary | ICD-10-CM | POA: Diagnosis not present

## 2022-05-27 DIAGNOSIS — R109 Unspecified abdominal pain: Secondary | ICD-10-CM | POA: Diagnosis not present

## 2022-05-28 DIAGNOSIS — R634 Abnormal weight loss: Secondary | ICD-10-CM | POA: Diagnosis not present

## 2022-05-28 DIAGNOSIS — I2510 Atherosclerotic heart disease of native coronary artery without angina pectoris: Secondary | ICD-10-CM | POA: Diagnosis not present

## 2022-05-28 DIAGNOSIS — I69318 Other symptoms and signs involving cognitive functions following cerebral infarction: Secondary | ICD-10-CM | POA: Diagnosis not present

## 2022-05-28 DIAGNOSIS — R197 Diarrhea, unspecified: Secondary | ICD-10-CM | POA: Diagnosis not present

## 2022-05-28 DIAGNOSIS — C569 Malignant neoplasm of unspecified ovary: Secondary | ICD-10-CM | POA: Diagnosis not present

## 2022-05-28 DIAGNOSIS — R109 Unspecified abdominal pain: Secondary | ICD-10-CM | POA: Diagnosis not present

## 2022-05-31 DIAGNOSIS — I2510 Atherosclerotic heart disease of native coronary artery without angina pectoris: Secondary | ICD-10-CM | POA: Diagnosis not present

## 2022-05-31 DIAGNOSIS — R109 Unspecified abdominal pain: Secondary | ICD-10-CM | POA: Diagnosis not present

## 2022-05-31 DIAGNOSIS — R197 Diarrhea, unspecified: Secondary | ICD-10-CM | POA: Diagnosis not present

## 2022-05-31 DIAGNOSIS — I69318 Other symptoms and signs involving cognitive functions following cerebral infarction: Secondary | ICD-10-CM | POA: Diagnosis not present

## 2022-05-31 DIAGNOSIS — C569 Malignant neoplasm of unspecified ovary: Secondary | ICD-10-CM | POA: Diagnosis not present

## 2022-05-31 DIAGNOSIS — R634 Abnormal weight loss: Secondary | ICD-10-CM | POA: Diagnosis not present

## 2022-06-04 DIAGNOSIS — E785 Hyperlipidemia, unspecified: Secondary | ICD-10-CM | POA: Diagnosis not present

## 2022-06-04 DIAGNOSIS — I719 Aortic aneurysm of unspecified site, without rupture: Secondary | ICD-10-CM | POA: Diagnosis not present

## 2022-06-04 DIAGNOSIS — I051 Rheumatic mitral insufficiency: Secondary | ICD-10-CM | POA: Diagnosis not present

## 2022-06-04 DIAGNOSIS — R109 Unspecified abdominal pain: Secondary | ICD-10-CM | POA: Diagnosis not present

## 2022-06-04 DIAGNOSIS — I2510 Atherosclerotic heart disease of native coronary artery without angina pectoris: Secondary | ICD-10-CM | POA: Diagnosis not present

## 2022-06-04 DIAGNOSIS — I69318 Other symptoms and signs involving cognitive functions following cerebral infarction: Secondary | ICD-10-CM | POA: Diagnosis not present

## 2022-06-04 DIAGNOSIS — R634 Abnormal weight loss: Secondary | ICD-10-CM | POA: Diagnosis not present

## 2022-06-04 DIAGNOSIS — R197 Diarrhea, unspecified: Secondary | ICD-10-CM | POA: Diagnosis not present

## 2022-06-04 DIAGNOSIS — I10 Essential (primary) hypertension: Secondary | ICD-10-CM | POA: Diagnosis not present

## 2022-06-04 DIAGNOSIS — I716 Thoracoabdominal aortic aneurysm, without rupture, unspecified: Secondary | ICD-10-CM | POA: Diagnosis not present

## 2022-06-04 DIAGNOSIS — H409 Unspecified glaucoma: Secondary | ICD-10-CM | POA: Diagnosis not present

## 2022-06-04 DIAGNOSIS — C569 Malignant neoplasm of unspecified ovary: Secondary | ICD-10-CM | POA: Diagnosis not present

## 2022-06-08 DIAGNOSIS — C569 Malignant neoplasm of unspecified ovary: Secondary | ICD-10-CM | POA: Diagnosis not present

## 2022-06-08 DIAGNOSIS — R109 Unspecified abdominal pain: Secondary | ICD-10-CM | POA: Diagnosis not present

## 2022-06-08 DIAGNOSIS — I69318 Other symptoms and signs involving cognitive functions following cerebral infarction: Secondary | ICD-10-CM | POA: Diagnosis not present

## 2022-06-08 DIAGNOSIS — I2510 Atherosclerotic heart disease of native coronary artery without angina pectoris: Secondary | ICD-10-CM | POA: Diagnosis not present

## 2022-06-08 DIAGNOSIS — R634 Abnormal weight loss: Secondary | ICD-10-CM | POA: Diagnosis not present

## 2022-06-08 DIAGNOSIS — R197 Diarrhea, unspecified: Secondary | ICD-10-CM | POA: Diagnosis not present

## 2022-06-09 DIAGNOSIS — R109 Unspecified abdominal pain: Secondary | ICD-10-CM | POA: Diagnosis not present

## 2022-06-09 DIAGNOSIS — R634 Abnormal weight loss: Secondary | ICD-10-CM | POA: Diagnosis not present

## 2022-06-09 DIAGNOSIS — I2510 Atherosclerotic heart disease of native coronary artery without angina pectoris: Secondary | ICD-10-CM | POA: Diagnosis not present

## 2022-06-09 DIAGNOSIS — C569 Malignant neoplasm of unspecified ovary: Secondary | ICD-10-CM | POA: Diagnosis not present

## 2022-06-09 DIAGNOSIS — I69318 Other symptoms and signs involving cognitive functions following cerebral infarction: Secondary | ICD-10-CM | POA: Diagnosis not present

## 2022-06-09 DIAGNOSIS — R197 Diarrhea, unspecified: Secondary | ICD-10-CM | POA: Diagnosis not present

## 2022-06-10 DIAGNOSIS — C569 Malignant neoplasm of unspecified ovary: Secondary | ICD-10-CM | POA: Diagnosis not present

## 2022-06-10 DIAGNOSIS — I2510 Atherosclerotic heart disease of native coronary artery without angina pectoris: Secondary | ICD-10-CM | POA: Diagnosis not present

## 2022-06-10 DIAGNOSIS — R634 Abnormal weight loss: Secondary | ICD-10-CM | POA: Diagnosis not present

## 2022-06-10 DIAGNOSIS — I69318 Other symptoms and signs involving cognitive functions following cerebral infarction: Secondary | ICD-10-CM | POA: Diagnosis not present

## 2022-06-10 DIAGNOSIS — R197 Diarrhea, unspecified: Secondary | ICD-10-CM | POA: Diagnosis not present

## 2022-06-10 DIAGNOSIS — R109 Unspecified abdominal pain: Secondary | ICD-10-CM | POA: Diagnosis not present

## 2022-06-11 DIAGNOSIS — C569 Malignant neoplasm of unspecified ovary: Secondary | ICD-10-CM | POA: Diagnosis not present

## 2022-06-11 DIAGNOSIS — R109 Unspecified abdominal pain: Secondary | ICD-10-CM | POA: Diagnosis not present

## 2022-06-11 DIAGNOSIS — R197 Diarrhea, unspecified: Secondary | ICD-10-CM | POA: Diagnosis not present

## 2022-06-11 DIAGNOSIS — R634 Abnormal weight loss: Secondary | ICD-10-CM | POA: Diagnosis not present

## 2022-06-11 DIAGNOSIS — I2510 Atherosclerotic heart disease of native coronary artery without angina pectoris: Secondary | ICD-10-CM | POA: Diagnosis not present

## 2022-06-11 DIAGNOSIS — I69318 Other symptoms and signs involving cognitive functions following cerebral infarction: Secondary | ICD-10-CM | POA: Diagnosis not present

## 2022-06-12 DIAGNOSIS — R197 Diarrhea, unspecified: Secondary | ICD-10-CM | POA: Diagnosis not present

## 2022-06-12 DIAGNOSIS — R109 Unspecified abdominal pain: Secondary | ICD-10-CM | POA: Diagnosis not present

## 2022-06-12 DIAGNOSIS — R634 Abnormal weight loss: Secondary | ICD-10-CM | POA: Diagnosis not present

## 2022-06-12 DIAGNOSIS — I69318 Other symptoms and signs involving cognitive functions following cerebral infarction: Secondary | ICD-10-CM | POA: Diagnosis not present

## 2022-06-12 DIAGNOSIS — C569 Malignant neoplasm of unspecified ovary: Secondary | ICD-10-CM | POA: Diagnosis not present

## 2022-06-12 DIAGNOSIS — I2510 Atherosclerotic heart disease of native coronary artery without angina pectoris: Secondary | ICD-10-CM | POA: Diagnosis not present

## 2022-06-13 DIAGNOSIS — I2510 Atherosclerotic heart disease of native coronary artery without angina pectoris: Secondary | ICD-10-CM | POA: Diagnosis not present

## 2022-06-13 DIAGNOSIS — R197 Diarrhea, unspecified: Secondary | ICD-10-CM | POA: Diagnosis not present

## 2022-06-13 DIAGNOSIS — C569 Malignant neoplasm of unspecified ovary: Secondary | ICD-10-CM | POA: Diagnosis not present

## 2022-06-13 DIAGNOSIS — R109 Unspecified abdominal pain: Secondary | ICD-10-CM | POA: Diagnosis not present

## 2022-06-13 DIAGNOSIS — I69318 Other symptoms and signs involving cognitive functions following cerebral infarction: Secondary | ICD-10-CM | POA: Diagnosis not present

## 2022-06-13 DIAGNOSIS — R634 Abnormal weight loss: Secondary | ICD-10-CM | POA: Diagnosis not present

## 2022-06-14 DIAGNOSIS — I2510 Atherosclerotic heart disease of native coronary artery without angina pectoris: Secondary | ICD-10-CM | POA: Diagnosis not present

## 2022-06-14 DIAGNOSIS — R634 Abnormal weight loss: Secondary | ICD-10-CM | POA: Diagnosis not present

## 2022-06-14 DIAGNOSIS — I69318 Other symptoms and signs involving cognitive functions following cerebral infarction: Secondary | ICD-10-CM | POA: Diagnosis not present

## 2022-06-14 DIAGNOSIS — R109 Unspecified abdominal pain: Secondary | ICD-10-CM | POA: Diagnosis not present

## 2022-06-14 DIAGNOSIS — R197 Diarrhea, unspecified: Secondary | ICD-10-CM | POA: Diagnosis not present

## 2022-06-14 DIAGNOSIS — C569 Malignant neoplasm of unspecified ovary: Secondary | ICD-10-CM | POA: Diagnosis not present

## 2022-06-15 DIAGNOSIS — C569 Malignant neoplasm of unspecified ovary: Secondary | ICD-10-CM | POA: Diagnosis not present

## 2022-06-15 DIAGNOSIS — R634 Abnormal weight loss: Secondary | ICD-10-CM | POA: Diagnosis not present

## 2022-06-15 DIAGNOSIS — I2510 Atherosclerotic heart disease of native coronary artery without angina pectoris: Secondary | ICD-10-CM | POA: Diagnosis not present

## 2022-06-15 DIAGNOSIS — I69318 Other symptoms and signs involving cognitive functions following cerebral infarction: Secondary | ICD-10-CM | POA: Diagnosis not present

## 2022-06-15 DIAGNOSIS — R197 Diarrhea, unspecified: Secondary | ICD-10-CM | POA: Diagnosis not present

## 2022-06-15 DIAGNOSIS — R109 Unspecified abdominal pain: Secondary | ICD-10-CM | POA: Diagnosis not present

## 2022-06-18 DIAGNOSIS — I69318 Other symptoms and signs involving cognitive functions following cerebral infarction: Secondary | ICD-10-CM | POA: Diagnosis not present

## 2022-06-18 DIAGNOSIS — I2510 Atherosclerotic heart disease of native coronary artery without angina pectoris: Secondary | ICD-10-CM | POA: Diagnosis not present

## 2022-06-18 DIAGNOSIS — C569 Malignant neoplasm of unspecified ovary: Secondary | ICD-10-CM | POA: Diagnosis not present

## 2022-06-18 DIAGNOSIS — R109 Unspecified abdominal pain: Secondary | ICD-10-CM | POA: Diagnosis not present

## 2022-06-18 DIAGNOSIS — R197 Diarrhea, unspecified: Secondary | ICD-10-CM | POA: Diagnosis not present

## 2022-06-18 DIAGNOSIS — R634 Abnormal weight loss: Secondary | ICD-10-CM | POA: Diagnosis not present

## 2022-06-22 DIAGNOSIS — I69318 Other symptoms and signs involving cognitive functions following cerebral infarction: Secondary | ICD-10-CM | POA: Diagnosis not present

## 2022-06-22 DIAGNOSIS — C569 Malignant neoplasm of unspecified ovary: Secondary | ICD-10-CM | POA: Diagnosis not present

## 2022-06-22 DIAGNOSIS — R109 Unspecified abdominal pain: Secondary | ICD-10-CM | POA: Diagnosis not present

## 2022-06-22 DIAGNOSIS — R634 Abnormal weight loss: Secondary | ICD-10-CM | POA: Diagnosis not present

## 2022-06-22 DIAGNOSIS — R197 Diarrhea, unspecified: Secondary | ICD-10-CM | POA: Diagnosis not present

## 2022-06-22 DIAGNOSIS — I2510 Atherosclerotic heart disease of native coronary artery without angina pectoris: Secondary | ICD-10-CM | POA: Diagnosis not present

## 2022-06-25 DIAGNOSIS — I2510 Atherosclerotic heart disease of native coronary artery without angina pectoris: Secondary | ICD-10-CM | POA: Diagnosis not present

## 2022-06-25 DIAGNOSIS — I69318 Other symptoms and signs involving cognitive functions following cerebral infarction: Secondary | ICD-10-CM | POA: Diagnosis not present

## 2022-06-25 DIAGNOSIS — C569 Malignant neoplasm of unspecified ovary: Secondary | ICD-10-CM | POA: Diagnosis not present

## 2022-06-25 DIAGNOSIS — R197 Diarrhea, unspecified: Secondary | ICD-10-CM | POA: Diagnosis not present

## 2022-06-25 DIAGNOSIS — R634 Abnormal weight loss: Secondary | ICD-10-CM | POA: Diagnosis not present

## 2022-06-25 DIAGNOSIS — R109 Unspecified abdominal pain: Secondary | ICD-10-CM | POA: Diagnosis not present

## 2022-06-29 DIAGNOSIS — I69318 Other symptoms and signs involving cognitive functions following cerebral infarction: Secondary | ICD-10-CM | POA: Diagnosis not present

## 2022-06-29 DIAGNOSIS — R197 Diarrhea, unspecified: Secondary | ICD-10-CM | POA: Diagnosis not present

## 2022-06-29 DIAGNOSIS — R109 Unspecified abdominal pain: Secondary | ICD-10-CM | POA: Diagnosis not present

## 2022-06-29 DIAGNOSIS — C569 Malignant neoplasm of unspecified ovary: Secondary | ICD-10-CM | POA: Diagnosis not present

## 2022-06-29 DIAGNOSIS — R634 Abnormal weight loss: Secondary | ICD-10-CM | POA: Diagnosis not present

## 2022-06-29 DIAGNOSIS — I2510 Atherosclerotic heart disease of native coronary artery without angina pectoris: Secondary | ICD-10-CM | POA: Diagnosis not present

## 2022-07-02 DIAGNOSIS — R197 Diarrhea, unspecified: Secondary | ICD-10-CM | POA: Diagnosis not present

## 2022-07-02 DIAGNOSIS — C569 Malignant neoplasm of unspecified ovary: Secondary | ICD-10-CM | POA: Diagnosis not present

## 2022-07-02 DIAGNOSIS — R109 Unspecified abdominal pain: Secondary | ICD-10-CM | POA: Diagnosis not present

## 2022-07-02 DIAGNOSIS — R634 Abnormal weight loss: Secondary | ICD-10-CM | POA: Diagnosis not present

## 2022-07-02 DIAGNOSIS — I69318 Other symptoms and signs involving cognitive functions following cerebral infarction: Secondary | ICD-10-CM | POA: Diagnosis not present

## 2022-07-02 DIAGNOSIS — I2510 Atherosclerotic heart disease of native coronary artery without angina pectoris: Secondary | ICD-10-CM | POA: Diagnosis not present

## 2022-07-05 DIAGNOSIS — I716 Thoracoabdominal aortic aneurysm, without rupture, unspecified: Secondary | ICD-10-CM | POA: Diagnosis not present

## 2022-07-05 DIAGNOSIS — H409 Unspecified glaucoma: Secondary | ICD-10-CM | POA: Diagnosis not present

## 2022-07-05 DIAGNOSIS — E785 Hyperlipidemia, unspecified: Secondary | ICD-10-CM | POA: Diagnosis not present

## 2022-07-05 DIAGNOSIS — I719 Aortic aneurysm of unspecified site, without rupture: Secondary | ICD-10-CM | POA: Diagnosis not present

## 2022-07-05 DIAGNOSIS — R197 Diarrhea, unspecified: Secondary | ICD-10-CM | POA: Diagnosis not present

## 2022-07-05 DIAGNOSIS — I2510 Atherosclerotic heart disease of native coronary artery without angina pectoris: Secondary | ICD-10-CM | POA: Diagnosis not present

## 2022-07-05 DIAGNOSIS — I051 Rheumatic mitral insufficiency: Secondary | ICD-10-CM | POA: Diagnosis not present

## 2022-07-05 DIAGNOSIS — C569 Malignant neoplasm of unspecified ovary: Secondary | ICD-10-CM | POA: Diagnosis not present

## 2022-07-05 DIAGNOSIS — R634 Abnormal weight loss: Secondary | ICD-10-CM | POA: Diagnosis not present

## 2022-07-05 DIAGNOSIS — R109 Unspecified abdominal pain: Secondary | ICD-10-CM | POA: Diagnosis not present

## 2022-07-05 DIAGNOSIS — I10 Essential (primary) hypertension: Secondary | ICD-10-CM | POA: Diagnosis not present

## 2022-07-05 DIAGNOSIS — I69318 Other symptoms and signs involving cognitive functions following cerebral infarction: Secondary | ICD-10-CM | POA: Diagnosis not present

## 2022-07-06 DIAGNOSIS — C569 Malignant neoplasm of unspecified ovary: Secondary | ICD-10-CM | POA: Diagnosis not present

## 2022-07-06 DIAGNOSIS — R197 Diarrhea, unspecified: Secondary | ICD-10-CM | POA: Diagnosis not present

## 2022-07-06 DIAGNOSIS — I69318 Other symptoms and signs involving cognitive functions following cerebral infarction: Secondary | ICD-10-CM | POA: Diagnosis not present

## 2022-07-06 DIAGNOSIS — R634 Abnormal weight loss: Secondary | ICD-10-CM | POA: Diagnosis not present

## 2022-07-06 DIAGNOSIS — R109 Unspecified abdominal pain: Secondary | ICD-10-CM | POA: Diagnosis not present

## 2022-07-06 DIAGNOSIS — I2510 Atherosclerotic heart disease of native coronary artery without angina pectoris: Secondary | ICD-10-CM | POA: Diagnosis not present

## 2022-07-07 DIAGNOSIS — I2510 Atherosclerotic heart disease of native coronary artery without angina pectoris: Secondary | ICD-10-CM | POA: Diagnosis not present

## 2022-07-07 DIAGNOSIS — R109 Unspecified abdominal pain: Secondary | ICD-10-CM | POA: Diagnosis not present

## 2022-07-07 DIAGNOSIS — C569 Malignant neoplasm of unspecified ovary: Secondary | ICD-10-CM | POA: Diagnosis not present

## 2022-07-07 DIAGNOSIS — R634 Abnormal weight loss: Secondary | ICD-10-CM | POA: Diagnosis not present

## 2022-07-07 DIAGNOSIS — I69318 Other symptoms and signs involving cognitive functions following cerebral infarction: Secondary | ICD-10-CM | POA: Diagnosis not present

## 2022-07-07 DIAGNOSIS — R197 Diarrhea, unspecified: Secondary | ICD-10-CM | POA: Diagnosis not present

## 2022-07-13 DIAGNOSIS — R197 Diarrhea, unspecified: Secondary | ICD-10-CM | POA: Diagnosis not present

## 2022-07-13 DIAGNOSIS — R109 Unspecified abdominal pain: Secondary | ICD-10-CM | POA: Diagnosis not present

## 2022-07-13 DIAGNOSIS — C569 Malignant neoplasm of unspecified ovary: Secondary | ICD-10-CM | POA: Diagnosis not present

## 2022-07-13 DIAGNOSIS — I2510 Atherosclerotic heart disease of native coronary artery without angina pectoris: Secondary | ICD-10-CM | POA: Diagnosis not present

## 2022-07-13 DIAGNOSIS — R634 Abnormal weight loss: Secondary | ICD-10-CM | POA: Diagnosis not present

## 2022-07-13 DIAGNOSIS — I69318 Other symptoms and signs involving cognitive functions following cerebral infarction: Secondary | ICD-10-CM | POA: Diagnosis not present

## 2022-07-14 DIAGNOSIS — R109 Unspecified abdominal pain: Secondary | ICD-10-CM | POA: Diagnosis not present

## 2022-07-14 DIAGNOSIS — R197 Diarrhea, unspecified: Secondary | ICD-10-CM | POA: Diagnosis not present

## 2022-07-14 DIAGNOSIS — I69318 Other symptoms and signs involving cognitive functions following cerebral infarction: Secondary | ICD-10-CM | POA: Diagnosis not present

## 2022-07-14 DIAGNOSIS — I2510 Atherosclerotic heart disease of native coronary artery without angina pectoris: Secondary | ICD-10-CM | POA: Diagnosis not present

## 2022-07-14 DIAGNOSIS — C569 Malignant neoplasm of unspecified ovary: Secondary | ICD-10-CM | POA: Diagnosis not present

## 2022-07-14 DIAGNOSIS — R634 Abnormal weight loss: Secondary | ICD-10-CM | POA: Diagnosis not present

## 2022-07-15 DIAGNOSIS — I69318 Other symptoms and signs involving cognitive functions following cerebral infarction: Secondary | ICD-10-CM | POA: Diagnosis not present

## 2022-07-15 DIAGNOSIS — C569 Malignant neoplasm of unspecified ovary: Secondary | ICD-10-CM | POA: Diagnosis not present

## 2022-07-15 DIAGNOSIS — R109 Unspecified abdominal pain: Secondary | ICD-10-CM | POA: Diagnosis not present

## 2022-07-15 DIAGNOSIS — I2510 Atherosclerotic heart disease of native coronary artery without angina pectoris: Secondary | ICD-10-CM | POA: Diagnosis not present

## 2022-07-15 DIAGNOSIS — R197 Diarrhea, unspecified: Secondary | ICD-10-CM | POA: Diagnosis not present

## 2022-07-15 DIAGNOSIS — R634 Abnormal weight loss: Secondary | ICD-10-CM | POA: Diagnosis not present

## 2022-07-18 DIAGNOSIS — R109 Unspecified abdominal pain: Secondary | ICD-10-CM | POA: Diagnosis not present

## 2022-07-18 DIAGNOSIS — I2510 Atherosclerotic heart disease of native coronary artery without angina pectoris: Secondary | ICD-10-CM | POA: Diagnosis not present

## 2022-07-18 DIAGNOSIS — R197 Diarrhea, unspecified: Secondary | ICD-10-CM | POA: Diagnosis not present

## 2022-07-18 DIAGNOSIS — C569 Malignant neoplasm of unspecified ovary: Secondary | ICD-10-CM | POA: Diagnosis not present

## 2022-07-18 DIAGNOSIS — R634 Abnormal weight loss: Secondary | ICD-10-CM | POA: Diagnosis not present

## 2022-07-18 DIAGNOSIS — I69318 Other symptoms and signs involving cognitive functions following cerebral infarction: Secondary | ICD-10-CM | POA: Diagnosis not present

## 2022-07-19 DIAGNOSIS — C569 Malignant neoplasm of unspecified ovary: Secondary | ICD-10-CM | POA: Diagnosis not present

## 2022-07-19 DIAGNOSIS — I69318 Other symptoms and signs involving cognitive functions following cerebral infarction: Secondary | ICD-10-CM | POA: Diagnosis not present

## 2022-07-19 DIAGNOSIS — R197 Diarrhea, unspecified: Secondary | ICD-10-CM | POA: Diagnosis not present

## 2022-07-19 DIAGNOSIS — I2510 Atherosclerotic heart disease of native coronary artery without angina pectoris: Secondary | ICD-10-CM | POA: Diagnosis not present

## 2022-07-19 DIAGNOSIS — R109 Unspecified abdominal pain: Secondary | ICD-10-CM | POA: Diagnosis not present

## 2022-07-19 DIAGNOSIS — R634 Abnormal weight loss: Secondary | ICD-10-CM | POA: Diagnosis not present

## 2022-07-20 DIAGNOSIS — C569 Malignant neoplasm of unspecified ovary: Secondary | ICD-10-CM | POA: Diagnosis not present

## 2022-07-20 DIAGNOSIS — R634 Abnormal weight loss: Secondary | ICD-10-CM | POA: Diagnosis not present

## 2022-07-20 DIAGNOSIS — R109 Unspecified abdominal pain: Secondary | ICD-10-CM | POA: Diagnosis not present

## 2022-07-20 DIAGNOSIS — I69318 Other symptoms and signs involving cognitive functions following cerebral infarction: Secondary | ICD-10-CM | POA: Diagnosis not present

## 2022-07-20 DIAGNOSIS — R197 Diarrhea, unspecified: Secondary | ICD-10-CM | POA: Diagnosis not present

## 2022-07-20 DIAGNOSIS — I2510 Atherosclerotic heart disease of native coronary artery without angina pectoris: Secondary | ICD-10-CM | POA: Diagnosis not present

## 2022-07-21 DIAGNOSIS — C569 Malignant neoplasm of unspecified ovary: Secondary | ICD-10-CM | POA: Diagnosis not present

## 2022-07-21 DIAGNOSIS — R109 Unspecified abdominal pain: Secondary | ICD-10-CM | POA: Diagnosis not present

## 2022-07-21 DIAGNOSIS — R634 Abnormal weight loss: Secondary | ICD-10-CM | POA: Diagnosis not present

## 2022-07-21 DIAGNOSIS — I69318 Other symptoms and signs involving cognitive functions following cerebral infarction: Secondary | ICD-10-CM | POA: Diagnosis not present

## 2022-07-21 DIAGNOSIS — R197 Diarrhea, unspecified: Secondary | ICD-10-CM | POA: Diagnosis not present

## 2022-07-21 DIAGNOSIS — I2510 Atherosclerotic heart disease of native coronary artery without angina pectoris: Secondary | ICD-10-CM | POA: Diagnosis not present

## 2022-07-22 DIAGNOSIS — C569 Malignant neoplasm of unspecified ovary: Secondary | ICD-10-CM | POA: Diagnosis not present

## 2022-07-22 DIAGNOSIS — R197 Diarrhea, unspecified: Secondary | ICD-10-CM | POA: Diagnosis not present

## 2022-07-22 DIAGNOSIS — I69318 Other symptoms and signs involving cognitive functions following cerebral infarction: Secondary | ICD-10-CM | POA: Diagnosis not present

## 2022-07-22 DIAGNOSIS — I2510 Atherosclerotic heart disease of native coronary artery without angina pectoris: Secondary | ICD-10-CM | POA: Diagnosis not present

## 2022-07-22 DIAGNOSIS — R109 Unspecified abdominal pain: Secondary | ICD-10-CM | POA: Diagnosis not present

## 2022-07-22 DIAGNOSIS — R634 Abnormal weight loss: Secondary | ICD-10-CM | POA: Diagnosis not present

## 2022-07-23 DIAGNOSIS — C569 Malignant neoplasm of unspecified ovary: Secondary | ICD-10-CM | POA: Diagnosis not present

## 2022-07-23 DIAGNOSIS — R109 Unspecified abdominal pain: Secondary | ICD-10-CM | POA: Diagnosis not present

## 2022-07-23 DIAGNOSIS — R634 Abnormal weight loss: Secondary | ICD-10-CM | POA: Diagnosis not present

## 2022-07-23 DIAGNOSIS — I69318 Other symptoms and signs involving cognitive functions following cerebral infarction: Secondary | ICD-10-CM | POA: Diagnosis not present

## 2022-07-23 DIAGNOSIS — I2510 Atherosclerotic heart disease of native coronary artery without angina pectoris: Secondary | ICD-10-CM | POA: Diagnosis not present

## 2022-07-23 DIAGNOSIS — R197 Diarrhea, unspecified: Secondary | ICD-10-CM | POA: Diagnosis not present

## 2022-07-24 DIAGNOSIS — R197 Diarrhea, unspecified: Secondary | ICD-10-CM | POA: Diagnosis not present

## 2022-07-24 DIAGNOSIS — R634 Abnormal weight loss: Secondary | ICD-10-CM | POA: Diagnosis not present

## 2022-07-24 DIAGNOSIS — I2510 Atherosclerotic heart disease of native coronary artery without angina pectoris: Secondary | ICD-10-CM | POA: Diagnosis not present

## 2022-07-24 DIAGNOSIS — C569 Malignant neoplasm of unspecified ovary: Secondary | ICD-10-CM | POA: Diagnosis not present

## 2022-07-24 DIAGNOSIS — R109 Unspecified abdominal pain: Secondary | ICD-10-CM | POA: Diagnosis not present

## 2022-07-24 DIAGNOSIS — I69318 Other symptoms and signs involving cognitive functions following cerebral infarction: Secondary | ICD-10-CM | POA: Diagnosis not present

## 2022-07-25 DIAGNOSIS — I2510 Atherosclerotic heart disease of native coronary artery without angina pectoris: Secondary | ICD-10-CM | POA: Diagnosis not present

## 2022-07-25 DIAGNOSIS — I69318 Other symptoms and signs involving cognitive functions following cerebral infarction: Secondary | ICD-10-CM | POA: Diagnosis not present

## 2022-07-25 DIAGNOSIS — R197 Diarrhea, unspecified: Secondary | ICD-10-CM | POA: Diagnosis not present

## 2022-07-25 DIAGNOSIS — C569 Malignant neoplasm of unspecified ovary: Secondary | ICD-10-CM | POA: Diagnosis not present

## 2022-07-25 DIAGNOSIS — R109 Unspecified abdominal pain: Secondary | ICD-10-CM | POA: Diagnosis not present

## 2022-07-25 DIAGNOSIS — R634 Abnormal weight loss: Secondary | ICD-10-CM | POA: Diagnosis not present

## 2022-07-26 DIAGNOSIS — I69318 Other symptoms and signs involving cognitive functions following cerebral infarction: Secondary | ICD-10-CM | POA: Diagnosis not present

## 2022-07-26 DIAGNOSIS — R197 Diarrhea, unspecified: Secondary | ICD-10-CM | POA: Diagnosis not present

## 2022-07-26 DIAGNOSIS — R109 Unspecified abdominal pain: Secondary | ICD-10-CM | POA: Diagnosis not present

## 2022-07-26 DIAGNOSIS — I2510 Atherosclerotic heart disease of native coronary artery without angina pectoris: Secondary | ICD-10-CM | POA: Diagnosis not present

## 2022-07-26 DIAGNOSIS — R634 Abnormal weight loss: Secondary | ICD-10-CM | POA: Diagnosis not present

## 2022-07-26 DIAGNOSIS — C569 Malignant neoplasm of unspecified ovary: Secondary | ICD-10-CM | POA: Diagnosis not present

## 2022-07-27 DIAGNOSIS — I69318 Other symptoms and signs involving cognitive functions following cerebral infarction: Secondary | ICD-10-CM | POA: Diagnosis not present

## 2022-07-27 DIAGNOSIS — I2510 Atherosclerotic heart disease of native coronary artery without angina pectoris: Secondary | ICD-10-CM | POA: Diagnosis not present

## 2022-07-27 DIAGNOSIS — R109 Unspecified abdominal pain: Secondary | ICD-10-CM | POA: Diagnosis not present

## 2022-07-27 DIAGNOSIS — R197 Diarrhea, unspecified: Secondary | ICD-10-CM | POA: Diagnosis not present

## 2022-07-27 DIAGNOSIS — C569 Malignant neoplasm of unspecified ovary: Secondary | ICD-10-CM | POA: Diagnosis not present

## 2022-07-27 DIAGNOSIS — R634 Abnormal weight loss: Secondary | ICD-10-CM | POA: Diagnosis not present

## 2022-08-04 DEATH — deceased
